# Patient Record
Sex: Female | Born: 1940
Health system: Southern US, Community
[De-identification: ages and names within clinical notes are randomized; demographics above are authoritative.]

## PROBLEM LIST (undated history)

## (undated) DIAGNOSIS — E119 Type 2 diabetes mellitus without complications: Secondary | ICD-10-CM

## (undated) DIAGNOSIS — I350 Nonrheumatic aortic (valve) stenosis: Secondary | ICD-10-CM

## (undated) DIAGNOSIS — C519 Malignant neoplasm of vulva, unspecified: Secondary | ICD-10-CM

## (undated) DIAGNOSIS — N3642 Intrinsic sphincter deficiency (ISD): Secondary | ICD-10-CM

## (undated) DIAGNOSIS — R011 Cardiac murmur, unspecified: Secondary | ICD-10-CM

## (undated) DIAGNOSIS — Z8679 Personal history of other diseases of the circulatory system: Secondary | ICD-10-CM

## (undated) DIAGNOSIS — Z8719 Personal history of other diseases of the digestive system: Secondary | ICD-10-CM

## (undated) DIAGNOSIS — I10 Essential (primary) hypertension: Secondary | ICD-10-CM

## (undated) DIAGNOSIS — Z8711 Personal history of peptic ulcer disease: Secondary | ICD-10-CM

## (undated) DIAGNOSIS — M1990 Unspecified osteoarthritis, unspecified site: Secondary | ICD-10-CM

## (undated) DIAGNOSIS — R918 Other nonspecific abnormal finding of lung field: Secondary | ICD-10-CM

## (undated) DIAGNOSIS — R32 Unspecified urinary incontinence: Secondary | ICD-10-CM

## (undated) DIAGNOSIS — E559 Vitamin D deficiency, unspecified: Secondary | ICD-10-CM

## (undated) DIAGNOSIS — Z9889 Other specified postprocedural states: Secondary | ICD-10-CM

## (undated) DIAGNOSIS — D509 Iron deficiency anemia, unspecified: Secondary | ICD-10-CM

## (undated) DIAGNOSIS — Z923 Personal history of irradiation: Secondary | ICD-10-CM

## (undated) DIAGNOSIS — M792 Neuralgia and neuritis, unspecified: Secondary | ICD-10-CM

## (undated) DIAGNOSIS — R112 Nausea with vomiting, unspecified: Secondary | ICD-10-CM

## (undated) HISTORY — PX: KNEE ARTHROSCOPY: SUR90

## (undated) HISTORY — DX: Personal history of irradiation: Z92.3

## (undated) HISTORY — PX: TOE SURGERY: SHX1073

## (undated) HISTORY — PX: CATARACT EXTRACTION W/ INTRAOCULAR LENS  IMPLANT, BILATERAL: SHX1307

## (undated) HISTORY — DX: Essential (primary) hypertension: I10

## (undated) HISTORY — PX: ANTERIOR CERVICAL DECOMP/DISCECTOMY FUSION: SHX1161

## (undated) HISTORY — DX: Vitamin D deficiency, unspecified: E55.9

## (undated) HISTORY — PX: LUMBAR SPINE SURGERY: SHX701

## (undated) HISTORY — DX: Nonrheumatic aortic (valve) stenosis: I35.0

## (undated) HISTORY — PX: TOTAL KNEE ARTHROPLASTY: SHX125

---

## 1965-01-21 HISTORY — PX: TONSILLECTOMY: SUR1361

## 1977-01-21 HISTORY — PX: ABDOMINAL HYSTERECTOMY: SHX81

## 2000-10-17 ENCOUNTER — Ambulatory Visit (HOSPITAL_COMMUNITY): Admission: RE | Admit: 2000-10-17 | Discharge: 2000-10-17 | Payer: Self-pay | Admitting: Family Medicine

## 2000-10-17 ENCOUNTER — Encounter: Payer: Self-pay | Admitting: Family Medicine

## 2002-03-11 ENCOUNTER — Ambulatory Visit (HOSPITAL_COMMUNITY): Admission: RE | Admit: 2002-03-11 | Discharge: 2002-03-11 | Payer: Self-pay | Admitting: Family Medicine

## 2002-03-11 ENCOUNTER — Encounter: Payer: Self-pay | Admitting: Family Medicine

## 2002-05-18 ENCOUNTER — Ambulatory Visit (HOSPITAL_BASED_OUTPATIENT_CLINIC_OR_DEPARTMENT_OTHER): Admission: RE | Admit: 2002-05-18 | Discharge: 2002-05-18 | Payer: Self-pay | Admitting: Orthopaedic Surgery

## 2002-09-14 ENCOUNTER — Ambulatory Visit (HOSPITAL_COMMUNITY): Admission: RE | Admit: 2002-09-14 | Discharge: 2002-09-14 | Payer: Self-pay | Admitting: Family Medicine

## 2002-09-14 ENCOUNTER — Encounter: Payer: Self-pay | Admitting: Family Medicine

## 2002-09-21 ENCOUNTER — Ambulatory Visit (HOSPITAL_COMMUNITY): Admission: RE | Admit: 2002-09-21 | Discharge: 2002-09-21 | Payer: Self-pay | Admitting: Family Medicine

## 2002-09-21 ENCOUNTER — Encounter: Payer: Self-pay | Admitting: Family Medicine

## 2002-11-09 ENCOUNTER — Ambulatory Visit (HOSPITAL_COMMUNITY): Admission: RE | Admit: 2002-11-09 | Discharge: 2002-11-10 | Payer: Self-pay | Admitting: Neurosurgery

## 2002-11-09 ENCOUNTER — Encounter: Payer: Self-pay | Admitting: Neurosurgery

## 2002-12-01 ENCOUNTER — Encounter: Admission: RE | Admit: 2002-12-01 | Discharge: 2002-12-01 | Payer: Self-pay | Admitting: Neurosurgery

## 2002-12-07 ENCOUNTER — Encounter (HOSPITAL_COMMUNITY): Admission: RE | Admit: 2002-12-07 | Discharge: 2003-01-06 | Payer: Self-pay | Admitting: Neurosurgery

## 2003-05-27 ENCOUNTER — Encounter (HOSPITAL_COMMUNITY): Admission: RE | Admit: 2003-05-27 | Discharge: 2003-06-29 | Payer: Self-pay | Admitting: Family Medicine

## 2004-05-24 ENCOUNTER — Ambulatory Visit (HOSPITAL_COMMUNITY): Admission: RE | Admit: 2004-05-24 | Discharge: 2004-05-24 | Payer: Self-pay | Admitting: Family Medicine

## 2004-07-01 ENCOUNTER — Emergency Department (HOSPITAL_COMMUNITY): Admission: EM | Admit: 2004-07-01 | Discharge: 2004-07-01 | Payer: Self-pay | Admitting: Emergency Medicine

## 2005-04-05 ENCOUNTER — Ambulatory Visit (HOSPITAL_COMMUNITY): Admission: RE | Admit: 2005-04-05 | Discharge: 2005-04-05 | Payer: Self-pay | Admitting: Family Medicine

## 2006-03-30 ENCOUNTER — Encounter: Admission: RE | Admit: 2006-03-30 | Discharge: 2006-03-30 | Payer: Self-pay | Admitting: Orthopedic Surgery

## 2006-09-03 ENCOUNTER — Ambulatory Visit (HOSPITAL_COMMUNITY): Admission: RE | Admit: 2006-09-03 | Discharge: 2006-09-03 | Payer: Self-pay | Admitting: Family Medicine

## 2007-04-22 ENCOUNTER — Ambulatory Visit (HOSPITAL_COMMUNITY): Admission: RE | Admit: 2007-04-22 | Discharge: 2007-04-22 | Payer: Self-pay | Admitting: Family Medicine

## 2007-08-13 ENCOUNTER — Ambulatory Visit (HOSPITAL_COMMUNITY): Admission: RE | Admit: 2007-08-13 | Discharge: 2007-08-13 | Payer: Self-pay | Admitting: Family Medicine

## 2007-09-11 ENCOUNTER — Encounter: Admission: RE | Admit: 2007-09-11 | Discharge: 2007-09-11 | Payer: Self-pay | Admitting: Orthopedic Surgery

## 2008-05-17 ENCOUNTER — Ambulatory Visit (HOSPITAL_COMMUNITY): Admission: RE | Admit: 2008-05-17 | Discharge: 2008-05-17 | Payer: Self-pay | Admitting: Family Medicine

## 2008-08-02 ENCOUNTER — Ambulatory Visit: Payer: Self-pay | Admitting: Internal Medicine

## 2008-08-02 DIAGNOSIS — R011 Cardiac murmur, unspecified: Secondary | ICD-10-CM

## 2008-08-02 DIAGNOSIS — R197 Diarrhea, unspecified: Secondary | ICD-10-CM

## 2008-08-03 ENCOUNTER — Encounter: Payer: Self-pay | Admitting: Internal Medicine

## 2008-08-04 ENCOUNTER — Ambulatory Visit: Payer: Self-pay | Admitting: Internal Medicine

## 2008-08-04 ENCOUNTER — Ambulatory Visit (HOSPITAL_COMMUNITY): Admission: RE | Admit: 2008-08-04 | Discharge: 2008-08-04 | Payer: Self-pay | Admitting: Internal Medicine

## 2008-08-04 ENCOUNTER — Encounter: Payer: Self-pay | Admitting: Internal Medicine

## 2008-08-14 ENCOUNTER — Encounter: Payer: Self-pay | Admitting: Internal Medicine

## 2008-08-15 ENCOUNTER — Encounter (INDEPENDENT_AMBULATORY_CARE_PROVIDER_SITE_OTHER): Payer: Self-pay

## 2008-08-17 ENCOUNTER — Telehealth (INDEPENDENT_AMBULATORY_CARE_PROVIDER_SITE_OTHER): Payer: Self-pay

## 2008-08-23 ENCOUNTER — Encounter: Payer: Self-pay | Admitting: Internal Medicine

## 2008-08-23 DIAGNOSIS — K5289 Other specified noninfective gastroenteritis and colitis: Secondary | ICD-10-CM | POA: Insufficient documentation

## 2008-08-24 ENCOUNTER — Encounter: Payer: Self-pay | Admitting: Internal Medicine

## 2008-08-25 LAB — CONVERTED CEMR LAB
Calcium: 9.9 mg/dL (ref 8.4–10.5)
Creatinine, Ser: 0.98 mg/dL (ref 0.40–1.20)
Sodium: 142 meq/L (ref 135–145)

## 2008-10-19 ENCOUNTER — Ambulatory Visit: Payer: Self-pay | Admitting: Cardiology

## 2008-10-19 ENCOUNTER — Ambulatory Visit (HOSPITAL_COMMUNITY): Admission: RE | Admit: 2008-10-19 | Discharge: 2008-10-19 | Payer: Self-pay | Admitting: Family Medicine

## 2008-10-19 ENCOUNTER — Encounter: Payer: Self-pay | Admitting: Family Medicine

## 2008-11-14 ENCOUNTER — Ambulatory Visit: Payer: Self-pay | Admitting: Gastroenterology

## 2008-12-05 ENCOUNTER — Encounter: Payer: Self-pay | Admitting: Gastroenterology

## 2008-12-12 LAB — CONVERTED CEMR LAB
Calcium: 9.4 mg/dL (ref 8.4–10.5)
Chloride: 103 meq/L (ref 96–112)
Creatinine, Ser: 0.67 mg/dL (ref 0.40–1.20)
Glucose, Bld: 119 mg/dL — ABNORMAL HIGH (ref 70–99)
Potassium: 4 meq/L (ref 3.5–5.3)
Sodium: 143 meq/L (ref 135–145)

## 2009-02-24 ENCOUNTER — Ambulatory Visit: Payer: Self-pay | Admitting: Internal Medicine

## 2009-02-27 DIAGNOSIS — Z860100 Personal history of colon polyps, unspecified: Secondary | ICD-10-CM | POA: Insufficient documentation

## 2009-05-04 ENCOUNTER — Encounter: Payer: Self-pay | Admitting: Internal Medicine

## 2009-10-16 ENCOUNTER — Ambulatory Visit (HOSPITAL_COMMUNITY): Admission: RE | Admit: 2009-10-16 | Discharge: 2009-10-16 | Payer: Self-pay | Admitting: Family Medicine

## 2010-02-20 NOTE — Assessment & Plan Note (Signed)
Summary: 70M FOLLOW UP/CDG   Visit Type:  Follow-up Visit Primary Care Provider:  Lubertha South  Chief Complaint:  F/U colitis.  History of Present Illness: History of tubular adenoma and microscopic colitis on colonoscopy last year. She is due for surveillance colonoscopy 2017. She could not afford Entocort. She took a course of lialda but tapered off this medication as she ran out over the Christmas holidays. She continues to be markedly improved having one to 2 bowel movements on average daily rarely has more than2; she never has more than 3. Her stools are formed. She is not have any blood per rectum. She has gained 6 pounds since her October 2010 weight in here. She is very pleased with her improvement.    Current Medications (verified): 1)  Metformin Hcl 500 Mg Tabs (Metformin Hcl) .... Take 1 Two Times A Day 2)  Avapro 150 Mg Tabs (Irbesartan) .... Once Daily 3)  Hydrochlorothiazide 25 Mg Tabs (Hydrochlorothiazide) .... Once Daily 4)  Aspirin 81 Mg Tbec (Aspirin) .... Once Daily  Allergies (verified): 1)  ! Relafen 2)  ! Tetracycline  Past History:  Past Medical History: Last updated: 2008/08/31 Diabetes, diagnosed 2 years ago Hypertension H/O Rheumatic fever  Family History: Last updated: August 31, 2008 Father, deceased secondary to renal failure, had throat cancer No FH of Colon Cancer Sister - breast cancer, separate bone cancer  Social History: Last updated: 2008-08-31 Marital Status: Married Children: Two Occupation: Retired from [employer] Patient has never smoked.  Alcohol Use - no Illicit Drug Use - no  Past Surgical History: Back Surgery Hysterectomy Tonsillectomy Knee Arthroscopy X2  Cervical disk surgery Left foot little toe surgery 08/2008  Vital Signs:  Patient profile:   70 year old female Height:      64 inches Weight:      222 pounds BMI:     38.24 Temp:     97.7 degrees F oral Pulse rate:   80 / minute BP sitting:   150 / 80  (left arm) Cuff  size:   large  Vitals Entered By: Cloria Spring LPN (February 24, 2009 9:30 AM)  Physical Exam  General:  very pleasant 70 year old lady in no distress Eyes:  no scleral icterus. Conjunctiva are pink Lungs:  clear to auscultation Heart:  regular rate and rhythm without murmur gallop rub Abdomen:  obese, positive bowel sounds, soft nontender without appreciable mass or organomegaly  Impression & Recommendations: Impression: Diarrhea secondary to microscopic colitis now in remission after a course of mesalamine therapy. History of tubular adenoma; due for surveillance colonoscopy 2017.  Recommendations: I told this nice lady she may well have a durable long-term remission after a course of mesalamine therapy. However, she could have a recurrence in the future. If that were to be the case, she would do need to go back on a course of lialda.  But for now, I feel that she can stay off of lialda.  Follow up here p.r.n.      Appended Document: Orders Update-charge    Clinical Lists Changes  Problems: Added new problem of COLONIC POLYPS, HX OF (ICD-V12.72) Orders: Added new Service order of Est. Patient Level III (84696) - Signed

## 2010-02-20 NOTE — Letter (Signed)
Summary: Internal Other  Internal Other   Imported By: Peggyann Shoals 05/04/2009 12:29:36  _____________________________________________________________________  External Attachment:    Type:   Image     Comment:   External Document

## 2010-03-20 ENCOUNTER — Encounter (HOSPITAL_COMMUNITY): Payer: Medicare Other

## 2010-03-20 ENCOUNTER — Ambulatory Visit (HOSPITAL_COMMUNITY)
Admission: RE | Admit: 2010-03-20 | Discharge: 2010-03-20 | Disposition: A | Discharge status: Home / Self Care | Payer: Medicare Other | Source: Ambulatory Visit | Attending: Orthopedic Surgery | Admitting: Orthopedic Surgery

## 2010-03-20 ENCOUNTER — Other Ambulatory Visit: Payer: Self-pay | Admitting: Orthopedic Surgery

## 2010-03-20 ENCOUNTER — Other Ambulatory Visit (HOSPITAL_COMMUNITY): Payer: Self-pay | Admitting: Orthopedic Surgery

## 2010-03-20 DIAGNOSIS — Z01810 Encounter for preprocedural cardiovascular examination: Secondary | ICD-10-CM | POA: Insufficient documentation

## 2010-03-20 DIAGNOSIS — M1710 Unilateral primary osteoarthritis, unspecified knee: Secondary | ICD-10-CM | POA: Insufficient documentation

## 2010-03-20 DIAGNOSIS — Z01818 Encounter for other preprocedural examination: Secondary | ICD-10-CM | POA: Insufficient documentation

## 2010-03-20 DIAGNOSIS — IMO0002 Reserved for concepts with insufficient information to code with codable children: Secondary | ICD-10-CM | POA: Insufficient documentation

## 2010-03-20 DIAGNOSIS — M1711 Unilateral primary osteoarthritis, right knee: Secondary | ICD-10-CM

## 2010-03-20 DIAGNOSIS — Z01812 Encounter for preprocedural laboratory examination: Secondary | ICD-10-CM | POA: Insufficient documentation

## 2010-03-20 LAB — COMPREHENSIVE METABOLIC PANEL
ALT: 27 U/L (ref 0–35)
AST: 24 U/L (ref 0–37)
Alkaline Phosphatase: 70 U/L (ref 39–117)
CO2: 31 mEq/L (ref 19–32)
GFR calc Af Amer: >60 mL/min (ref >60)
GFR calc non Af Amer: >60 mL/min (ref >60)
Glucose, Bld: 93 mg/dL (ref 70–99)
Sodium: 142 mEq/L (ref 135–145)

## 2010-03-20 LAB — SURGICAL PCR SCREEN
MRSA, PCR: NEGATIVE
Staphylococcus aureus: NEGATIVE

## 2010-03-20 LAB — URINALYSIS, ROUTINE W REFLEX MICROSCOPIC: Nitrite: NEGATIVE

## 2010-03-20 LAB — DIFFERENTIAL
Lymphs Abs: 1.9 10*3/uL (ref 0.7–4.0)
Monocytes Absolute: 0.7 10*3/uL (ref 0.1–1.0)
Monocytes Relative: 10 % (ref 3–12)
Neutro Abs: 4.5 10*3/uL (ref 1.7–7.7)
Neutrophils Relative %: 62 % (ref 43–77)

## 2010-03-20 LAB — CBC
HCT: 39.7 % (ref 36.0–46.0)
MCH: 27.8 pg (ref 26.0–34.0)
MCV: 86.9 fL (ref 78.0–100.0)
Platelets: 179 10*3/uL (ref 150–400)
RDW: 13.1 % (ref 11.5–15.5)

## 2010-03-20 LAB — PROTIME-INR: INR: 1 (ref 0.00–1.49)

## 2010-03-20 LAB — URINE MICROSCOPIC-ADD ON

## 2010-03-28 ENCOUNTER — Inpatient Hospital Stay (HOSPITAL_COMMUNITY)
Admission: RE | Admit: 2010-03-28 | Discharge: 2010-04-02 | DRG: 470 | Disposition: A | Discharge status: Home Health | Payer: Medicare Other | Source: Ambulatory Visit | Attending: Orthopedic Surgery | Admitting: Orthopedic Surgery

## 2010-03-28 ENCOUNTER — Inpatient Hospital Stay (HOSPITAL_COMMUNITY): Payer: Medicare Other

## 2010-03-28 DIAGNOSIS — M1710 Unilateral primary osteoarthritis, unspecified knee: Principal | ICD-10-CM | POA: Diagnosis present

## 2010-03-28 DIAGNOSIS — E119 Type 2 diabetes mellitus without complications: Secondary | ICD-10-CM | POA: Diagnosis present

## 2010-03-28 DIAGNOSIS — I10 Essential (primary) hypertension: Secondary | ICD-10-CM | POA: Diagnosis present

## 2010-03-28 DIAGNOSIS — M21869 Other specified acquired deformities of unspecified lower leg: Secondary | ICD-10-CM | POA: Diagnosis present

## 2010-03-28 LAB — ABO/RH: ABO/RH(D): B POS

## 2010-03-28 LAB — GLUCOSE, CAPILLARY: Glucose-Capillary: 144 mg/dL — ABNORMAL HIGH (ref 70–99)

## 2010-03-29 LAB — GLUCOSE, CAPILLARY: Glucose-Capillary: 123 mg/dL — ABNORMAL HIGH (ref 70–99)

## 2010-03-29 LAB — PROTIME-INR: Prothrombin Time: 14.6 seconds (ref 11.6–15.2)

## 2010-03-30 LAB — GLUCOSE, CAPILLARY
Glucose-Capillary: 120 mg/dL — ABNORMAL HIGH (ref 70–99)
Glucose-Capillary: 126 mg/dL — ABNORMAL HIGH (ref 70–99)
Glucose-Capillary: 106 mg/dL — ABNORMAL HIGH (ref 70–99)

## 2010-03-30 LAB — HEMOGLOBIN AND HEMATOCRIT, BLOOD: HCT: 32 % — ABNORMAL LOW (ref 36.0–46.0)

## 2010-03-31 LAB — GLUCOSE, CAPILLARY
Glucose-Capillary: 110 mg/dL — ABNORMAL HIGH (ref 70–99)
Glucose-Capillary: 93 mg/dL (ref 70–99)

## 2010-03-31 LAB — PROTIME-INR: Prothrombin Time: 17.4 seconds — ABNORMAL HIGH (ref 11.6–15.2)

## 2010-04-01 LAB — GLUCOSE, CAPILLARY

## 2010-04-02 LAB — GLUCOSE, CAPILLARY: Glucose-Capillary: 115 mg/dL — ABNORMAL HIGH (ref 70–99)

## 2010-04-02 LAB — PROTIME-INR
INR: 1.57 — ABNORMAL HIGH (ref 0.00–1.49)
Prothrombin Time: 19 seconds — ABNORMAL HIGH (ref 11.6–15.2)

## 2010-04-03 NOTE — H&P (Addendum)
[name], [name]               ACCOUNT NO.:  [id number]  MEDICAL RECORD NO.:  [id number]           PATIENT TYPE:  LOCATION:                                 FACILITY:  PHYSICIAN:  Georges Lynch. Hillis Mcphatter, M.D.DATE OF BIRTH:  Jun 17, 1940  DATE OF ADMISSION: DATE OF DISCHARGE:                             HISTORY & PHYSICAL   CHIEF COMPLAINT:  Right knee pain.  BRIEF HISTORY:  [name] has been followed by Dr. Darrelyn Hillock for worsening pain in her right knee.  She has failed conservative measures such as cortisone injection as well as viscous supplementation.  At this point, she feels as though the knee will give out.  It does wake her up at nighttime and it is hurting her at all times.  She now presents for right total knee arthroplasty.  Her primary care physician is Dr. Simone Curia and she has been cleared for surgery by him.  MEDICATION ALLERGIES:  REGLAN, THIS CAUSES NAUSEA, VOMITING; TETRACYCLINE CAUSES HEADACHE; AND OXYCODONE ALSO CAUSES NAUSEA, VOMITING.  BODY:  The patient states she does fine with Vicodin.  CURRENT MEDICATIONS:  Losartan, metformin, Lasix, Klor-Con, aspirin which she will discontinue a week prior to surgery.  PAST MEDICAL HISTORY: 1. End-stage arthritis of the right knee. 2. Vertigo. 3. Rheumatic fever as a child. 4. Hypertension. 5. Heart murmur. 6. Non-insulin dependent diabetes. 7. Arthritis. 8. Degenerative disk disease. 9. History of measles as a child. 10.History of mumps as a child.  PAST SURGICAL HISTORY: 1. Tonsillectomy. 2. Hysterectomy. 3. Left knee arthroscopy. 4. Back surgery x2.  FAMILY HISTORY:  Father passed at the age of 17, he had hypertension and cancer.  Mother passed at age of 77, also hypertension and aneurysm.  SOCIAL HISTORY:  The patient is married.  She denies past or present use of alcohol or tobacco products.  She lives at home with her husband. She does plan to go home following her hospital stay.  REVIEW OF  SYSTEMS:  GENERAL:  Negative for fever, chills or weight change.  HEENT:  Negative for insomnia or blurred vision. CARDIOVASCULAR:  Negative for chest pain.  RESPIRATORY:  Negative for shortness of breath.  GI: Negative for reflux disease or ulcers.  GU: Negative for hematuria.  MUSCULOSKELETAL:  Positive for joint pain and joint swelling.  DERMATOLOGIC:  Negative for rash or lesions.  PHYSICAL EXAMINATION:  VITAL SIGNS:  Blood pressure is 118/76 in the left arm. GENERAL:  [name] is alert and oriented x3.  She is well-developed, well-nourished and in no apparent distress. HEENT:  Normocephalic, atraumatic.  Extraocular movements intact. NECK:  Supple.  Full range of motion without lymphadenopathy. CHEST:  Lungs are clear to auscultation bilaterally without wheezes. HEART:  Regular rate and rhythm without murmur. ABDOMEN:  Bowel sounds present in all 4 quadrants. EXTREMITIES:  Evaluation of the right knee, negative for effusion, pain with palpation over the medial joint line.  Range is 0 to 120 degrees. Patella tracking normally. SKIN:  Unremarkable. NEUROLOGIC:  Intact.  RADIOGRAPHIC DATA:  AP and lateral views of the right knee reveal that she is bone-on-bone in the medial compartment  with some patellofemoral involvement.  IMPRESSION:  End-stage arthritis of the right knee.  PLAN:  Right total knee arthroplasty to be performed by Dr. Darrelyn Hillock.     Rozell Searing, PAC   ______________________________ Georges Lynch Darrelyn Hillock, M.D.    LD/MEDQ  D:  04/03/2010  T:  04/03/2010  Job:  [id number]  Electronically Signed by Rozell Searing  on 04/03/2010 07:02:30 PM Electronically Signed by Ranee Gosselin M.D. on 04/04/2010 08:29:50 AM

## 2010-04-04 NOTE — Op Note (Signed)
[name], [name]               ACCOUNT NO.:  [id number]  MEDICAL RECORD NO.:  [id number]           PATIENT TYPE:  I  LOCATION:  0004                         FACILITY:  [facility]  PHYSICIAN:  Georges Lynch. Aviela Blundell, M.D.DATE OF BIRTH:  [birth year]  DATE OF PROCEDURE:  03/28/2010 DATE OF DISCHARGE:                              OPERATIVE REPORT   SURGEON:  Georges Lynch. Darrelyn Hillock, M.D.  ASSISTANT:  Rozell Searing, [employer]  PREOPERATIVE DIAGNOSIS:  Severe degenerative arthritis with a varus deformity of the right knee.  POSTOPERATIVE DIAGNOSIS:  Severe degenerative arthritis with a varus deformity of the right knee.  OPERATION:  Right total-knee arthroplasty utilizing DePuy system.  All three components were cemented.  The sizes used was a size 4 tibial insert with a 10-mm thickness, a size 38 patella.  The tibial tray was a size 4 cemented.  The femoral component was a size 4 right posterior stabilized and gentamicin was used in the cement.PROCEDURE IN DETAIL:  Under general anesthesia, routine orthopedic prepping and draping of the right lower extremity was carried out.  She had 2 grams of IV Ancef.  The appropriate time-out was carried out first.  Prior to that, I marked the appropriate right leg in the holding area.  Once the leg was exsanguinated with an Esmarch, the tourniquet was elevated to 325 mmHg.  Then the knee was flexed on the [facility] knee holder.  I then made an incision down the anterior aspect of the right knee.  Bleeders were identified and cauterized.  Two flaps were created. I then went on and carried out a median parapatellar incision.  I reflected the patella laterally, flexed the knee, and did medial and lateral meniscectomy.  Note, the knee was extremely tight so I did a nice soft tissue release first.  Following that, I went on and made my initial drill hole in the intercondylar notch of the femur.  I then removed 12-mm thickness off the distal aspect of the femur.  Next,  we measured the femur to be a size 4 and I carried out my anterior, posterior and chamfer cuts of the femur in the usual fashion.  Following that, I then prepared the tibia for a size 4 tibia.  I removed approximately 10 mm thickness off the unaffected lateral side.  Note, we went through two separate cuts to make sure we had the appropriate gap. We then measured the gap with the appropriate flexion and extension gap instruments.  Following that, I then went on and cut my keel cut out of the tibial plateau.  I then went on and cut my notch cut out of the distal femur and the trials were inserted.  We had an excellent fit. With the trials in place, I then did a resurfacing procedure on the patella in the usual fashion.  Three drill holes were made in the patella.  Following that, I then removed all trial components, thoroughly water picked out the knee and cemented all three components in simultaneously.  Once the cement was hardened, I then removed all loose pieces of cement.  We water picked out the knee again  and made sure there were no loose pieces of cement.  I then inserted my 10-mm thickness size 4 rotating platform into the tibial tray.  I did that after I injected 10 cc of Surgiflo into the soft tissue areas.  I then reduced the knee and took the knee through flexion and extension and we had good stability and good motion.  I then closed the knee in the layered usual fashion over a Hemovac drain.  I inserted some thrombin-soaked Gelfoam.  The tourniquet was let down after I closed the first layer.  We had excellent hemostasis.  Subcutaneous was closed with 0-Vicryl, skin with metal staples.  A sterile Neosporin dressing was applied.          ______________________________ Georges Lynch Darrelyn Hillock, M.D.     RAG/MEDQ  D:  03/28/2010  T:  03/28/2010  Job:  [id number]  cc:   Windy Fast A. Darrelyn Hillock, M.D. Fax: [phone]  Electronically Signed by Ranee Gosselin M.D. on 04/04/2010 08:29:46  AM

## 2010-04-25 NOTE — Discharge Summary (Addendum)
[name], [name]               ACCOUNT NO.:  [id number]  MEDICAL RECORD NO.:  [id number]          PATIENT TYPE:  LOCATION:                                 FACILITY:  PHYSICIAN:  Georges Lynch. Brent Taillon, M.D.DATE OF BIRTH:  03-Jun-1940  DATE OF ADMISSION: DATE OF DISCHARGE:                              DISCHARGE SUMMARY   ADMITTING DIAGNOSES: 1. End-stage arthritis of the right knee. 2. Vertigo. 3. Rheumatic fever, as a child. 4. Hypertension. 5. Heart murmur. 6. Non-insulin dependent diabetes. 7. Arthritis. 8. Degenerative disk disease. 9. History of measles, as a child. 10.History of mumps, as a child.  DISCHARGE DIAGNOSES: 1. End-stage arthritis of the right knee, status post right total knee     arthroplasty. 2. Vertigo. 3. Rheumatic fever, as a child. 4. Hypertension. 5. Heart murmur. 6. Non-insulin dependent diabetes. 7. Arthritis. 8. Degenerative disk disease. 9. History of measles, as a child. 10.History of mumps, as a child.  LABORATORY DATA:  Serial H and Hs were taken throughout the patient's hospital stay.  Postoperative day #1, the patient's hemoglobin was 11.2, her INR had reached 1.12.  Postoperative day #2, hemoglobin had dropped to 10.3, INR had reached 1.68.  Postoperative day #3, the patient's hemoglobin dropped further to 9.9.  She did not require blood transfusion throughout her hospital stay.  Postoperative day #5, the patient's INR was at 1.57.  PROCEDURE:  On March 28, 2010, [name] was taken to the operating room by surgeon, Dr. Ranee Gosselin, and assistant, Rozell Searing, PA- C.  HOSPITAL COURSE:  She underwent right total knee arthroplasty.  The procedure was performed under general anesthesia.  Routine orthopedic prep and drape were carried out.  She received 2 g of IV Ancef preoperatively.  There were no complications with the procedure.  She was returned to the recovery room in satisfactory condition. Postoperative radiograph  revealed satisfactory postoperative appearance of a right total knee.  After adequate time in the recovery room, she was transported to the orthopedic floor.  For further recovery, she was placed on reduced-dose Dilaudid PCA as well as muscle relaxant. Postoperative day #1, the patient was doing well.  We discontinued her Foley as well as her PCA and she was to ambulate with physical therapy. She was also started on Coumadin and Lovenox for protection against DVT. Hemovac was also removed on postoperative day #1.  On postoperative day #1, the patient was able to ambulate 50 feet with physical therapy. Postoperative day #2, she was able to ambulate 100 feet in the morning session and then 120 feet in the afternoon session using a rolling walker with minimal assistance.  Dressing was changed on postoperative day #2, wound was well approximated, no signs of infection.  Postop day #3, she continued to progress with therapy.  She was able to go up and down the stairs with assistance and continued with ambulation. Postoperative day #4, she was able to walk further with physical therapy as well as do the stairs.  She continued to progress nicely and postoperative day #5, she continued to do well.  She was discharged home on postoperative day #5.  DISPOSITION:  To home on April 02, 2010.  MEDICATIONS ON DISCHARGE: 1. Percocet. 2. Robaxin. 3. Coumadin. 4. Potassium chloride. 5. Lasix. 6. Metformin. 7. Losartan.  ACTIVITY:  The patient will increase activity slowly.  She should use a walker or assistance.  She is able to shower.  She should be mindful to not submerge the wound in water.  WOUND CARE:  She will need daily dressing changes.  SPECIAL INSTRUCTIONS:  The patient is be on Coumadin for 4 weeks from the day of surgery.  Home health will monitor her INR which she needs to be kept between 2-3 to remain therapeutic.  Diet, 1800-calorie diabetic diet.  FOLLOWUP:  [name] will  follow up with Dr. Darrelyn Hillock in 2 weeks from the day of surgery.  She will contact the office at [phone] and schedule this appointment.  CONDITION ON DISCHARGE:  Improving.     Rozell Searing, PAC   ______________________________ Georges Lynch Darrelyn Hillock, M.D.    LD/MEDQ  D:  04/22/2010  T:  04/23/2010  Job:  [id number]  Electronically Signed by Rozell Searing  on 04/25/2010 11:35:13 PM Electronically Signed by Ranee Gosselin M.D. on 05/04/2010 07:53:50 AM

## 2010-04-29 LAB — FECAL LACTOFERRIN, QUANT: Fecal Lactoferrin: NEGATIVE

## 2010-04-29 LAB — GLUCOSE, CAPILLARY

## 2010-04-29 LAB — OVA AND PARASITE EXAMINATION: Ova and parasites: NONE SEEN

## 2010-04-29 LAB — STOOL CULTURE

## 2010-04-29 LAB — CLOSTRIDIUM DIFFICILE EIA

## 2010-06-05 NOTE — Op Note (Signed)
NAMEELLENORE, ROSCOE               ACCOUNT NO.:  [id number]   MEDICAL RECORD NO.:  [id number]          PATIENT TYPE:  AMB   LOCATION:  DAY                           FACILITY:  [facility]   PHYSICIAN:  R. Roetta Sessions, M.D. DATE OF BIRTH:  1940/05/22   DATE OF PROCEDURE:  08/04/2008  DATE OF DISCHARGE:                               OPERATIVE REPORT   PROCEDURE:  Ileocolonoscopy and segmental biopsies, snare polypectomy,  stool sampling.   INDICATIONS FOR PROCEDURE:  A 70 year old lady with chronically loose  stools.  She has never had her lower GI tract evaluated.  Ileocolonoscopy now being done.  Risks, benefits, alternatives and  limitations have been discussed, questions answered.  Please see  documentation in the medical record.   PROCEDURE NOTE:  O2 saturation, blood pressure, pulse and respirations  were monitored throughout the entirety of the procedure.  Conscious  sedation Versed 4 mg IV, Demerol 75 mg IV in divided doses.  Instrument  Pentax video chip system.   FINDINGS:  Digital rectal exam revealed no abnormalities.  Endoscopic  findings:  The prep was adequate.  Colon:  Colonic mucosa was surveyed  from the rectosigmoid junction through the left transverse, right colon  to the appendiceal orifice, ileocecal valve and cecum.  These structures  were well seen and photographed for the record.  Terminal ileum was  intubated to 5 cm.  From this level the scope was slowly withdrawn.  All  previously mentioned mucosal surfaces were again seen.  The patient had  a few shallow sigmoid diverticula.  There was a 5 mm polyp on a stalk  mid descending colon which was hot snare removed.  The remainder of the  colonic mucosa and terminal ileum mucosa appeared normal.  Several  biopsies of descending and sigmoid segments were taken to rule out  microscopic colitis.  Stool samples sent to the microbiology lab.  Scope  was pulled down in the rectum.  Thorough examination of the rectal  mucosa including retroflexed view of the anal verge demonstrated no  abnormalities.  The patient tolerated the procedure well and was  reactive after endoscopy.  Cecal withdrawal time 11 minutes.   IMPRESSION:  Normal rectum, shallow sigmoid diverticula, 5 mm  pedunculated polyp mid descending colon status post hot snare removal,  status post segmental biopsies and stool sampling.  Remainder of colonic  mucosa and terminal ileum mucosa appeared normal.   RECOMMENDATIONS:  Diverticulosis/polyp literature provided to Ms.  Yeh.  Follow up on path and stool studies.  Further recommendations  to follow.      Jonathon Bellows, M.D.  Electronically Signed     RMR/MEDQ  D:  08/04/2008  T:  08/04/2008  Job:  [id number]

## 2010-06-08 NOTE — Op Note (Signed)
[name], [name]                         ACCOUNT NO.:  [id number]   MEDICAL RECORD NO.:  [id number]                   PATIENT TYPE:  OIB   LOCATION:  2860                                 FACILITY:  [facility]   PHYSICIAN:  Reinaldo Meeker, M.D.              DATE OF BIRTH:  05-03-1940   DATE OF PROCEDURE:  11/09/2002  DATE OF DISCHARGE:                                 OPERATIVE REPORT   PREOPERATIVE DIAGNOSIS:  Spinal stenosis, C3-4, C4-5, C5-6.   POSTOPERATIVE DIAGNOSIS:  Spinal stenosis, C3-4, C4-5, C5-6.   OPERATION PERFORMED:  C3-4, C4-5, C5-6 anterior cervical diskectomy with  bone bank fusion followed by Premier anterior cervical plating with the  operating microscope.   SURGEON:  Reinaldo Meeker, M.D.   ASSISTANT:  Kathaleen Maser. Pool, M.D.   ANESTHESIA:  General.   DESCRIPTION OF PROCEDURE:  After being placed in supine position and 10  pounds of halter traction, the patient's neck was prepped and draped in the  usual sterile fashion.  Localizing x-ray was taken prior to incision to  identify the appropriate level.  A transverse incision was made in the right  anterior neck starting at the midline and heading towards the medial aspect  of sternocleidomastoid muscle.  The platysma was then incised transversely  and the natural fascial plate between the strap muscles medially and the  sternocleidomastoid laterally was identified and followed down to the  anterior aspect of the cervical spine.  The longus colli muscles were  identified and split in the midline and stripped away bilaterally with the  Barista.  Self-retaining retractor was placed for exposure.  X-ray  showed approach to the appropriate levels.  Using a 15 blade, the annulus of  the disk at C3-4, C4-5 and C5-6 was incised.  Using pituitary rongeurs and  curets, the disk material was removed.  A high speed drill was used to widen  the interspace down to the posterior longitudinal ligament.  This was  done  at all three levels.  At this time the microscope was draped and brought  into the field and used for the remainder of the case.  Starting at C4-5, a  high speed drill was used to further widen the interspace and drill down to  the osteophytes that were pressing on the spinal cord.  Using 1 and 2 mm  Kerrison rongeurs these were removed to decompress the spinal cord  thoroughly.  Proximal foraminal decompression was then carried out  bilaterally, particularly on the right, more symptomatic side.  At this time  inspection was carried out for any evidence of residual compression and none  could be identified.  Attention was then turned to C4-5 where a similar  procedure was carried out.  Once again, the high speed drill was used to  drill all the way down to the osteophytes and these were removed with 1 to 2  mm  Kerrison punches.  Once again the posterior longitudinal ligament was  removed until the spinal dura was well exposed and decompressed.  Once  again, proximal foraminal decompression was carried out bilaterally.  Finally at C3-4 a similar procedure was carried out once again removing all  overgrown bony material as well as herniated disk material.  Once again,  spinal decompression was carried out thoroughly as well as proximal  foraminal decompression.  At this point inspection was carried out in all  directions for any evidence of residual compression.  None could be  identified.  Large amounts of irrigation were carried out.  Measurements  were taken and two 7 mm and one 8 mm bone bank plugs reconstituted.  After  irrigating once more and confirming hemostasis, the plugs were impacted with  8 mm plug going into C3-4 and the 7 mm plugs going into C4-5 and C5-6.  Fluoroscopy showed them to be in good position.  The microscope was removed  and fluoroscopy brought back into the field.  An appropriate length Premier  anterior  cervical plate was then chosen.  Under fluoroscopic  inferior drill  holes were placed followed by placement of the other six screws at variable  angles.  The locking mechanisms were then sewed into place and secured.  Final fluoroscopy showed the plate, plug and 13 mm screws to all be in good  position.  At the same time, large amounts of irrigation were carried out.  Any bleeding was controlled with bipolar coagulation.  The wound was closed  using interrupted Vicryl in the platysma muscle, inverted 5-0 PDS in the  subcuticular layer, Steri-Strips on the skin.  A sterile dressing was then  applied along with a soft collar and the patient was extubated and taken to  the recovery room in stable condition.                                                Reinaldo Meeker, M.D.    ROK/MEDQ  D:  11/09/2002  T:  11/09/2002  Job:  [id number]

## 2010-06-08 NOTE — Op Note (Signed)
[name], [name]                         ACCOUNT NO.:  [id number]   MEDICAL RECORD NO.:  [id number]                   PATIENT TYPE:  AMB   LOCATION:  DSC                                  FACILITY:  [facility]   PHYSICIAN:  Lubertha Basque. Jerl Santos, M.D.             DATE OF BIRTH:  1940/06/25   DATE OF PROCEDURE:  05/18/2002  DATE OF DISCHARGE:                                 OPERATIVE REPORT   PREOPERATIVE DIAGNOSES:  1. Left knee torn medial meniscus.  2. Left knee degenerative joint disease.   POSTOPERATIVE DIAGNOSES:  1. Left knee torn medial meniscus.  2. Left knee degenerative joint disease.   PROCEDURES:  1. Left knee partial medial meniscectomy.  2. Left knee chondroplasty of the patellofemoral and medial compartment.   ANESTHESIA:  General.   SURGEON:  Lubertha Basque. Jerl Santos, M.D.   ASSISTANT:  Lindwood Qua, P.A.   INDICATION FOR PROCEDURE:  The patient is a 70 year old woman who injured  her knee several months ago when she slipped in a parking lot.  She has  persisted with pain despite oral anti-inflammatories and rest.  She had an  arthroscopy five or six years ago on this knee and it had done well until  this recent episode.  She was offered an arthroscopy at this point, as she  has pain at rest and pain with activity.  Informed operative consent was  obtained after a discussion of the possible complications of, reaction to  anesthesia, and infection.   DESCRIPTION OF PROCEDURE:  The patient was taken to the operating suite,  where a general anesthetic was applied without difficulty.  She was  positioned supine and prepped and draped in a normal sterile fashion.  After  the administration of preop IV antibiotics, an arthroscopy of the left knee  was performed through a total of two inferior portals.  The suprapatellar  pouch was benign, while the patellofemoral joint exhibited some grade 4  change with some large loose flaps of articular cartilage which appeared  to  be getting caught in the joint.  This mostly involved the intertrochlear  groove.  A thorough chondroplasty was done.  The kneecap did track well.  The medial compartment was notable for some broad areas of grade 3 change on  the medial femoral condyle, addressed with a chondroplasty.  She did have a  tear of the posterior horn of the medial meniscus, degenerative in nature,  and this was addressed with a 5 or 10% partial medial meniscectomy back to  stable tissues.  The ACL appeared to be intact.  The lateral compartment was  benign with no evidence of meniscal or articular cartilage injury.  The knee  was thoroughly irrigated at the end of the case, followed by placement of  Marcaine with epinephrine and morphine plus some Depo-Medrol.  Adaptic was  placed over the wounds, followed by dry gauze and a loose Ace wrap.  Estimated blood loss and intraoperative fluids can be obtained from  anesthesia records.    DISPOSITION:  The patient was extubated in the operating room and taken to  the recovery room in stable condition.  Plans were for her to go home the  same day and follow up in the office in less than a week.  I will contact  her by phone tonight.                                                Lubertha Basque Jerl Santos, M.D.    PGD/MEDQ  D:  05/18/2002  T:  05/18/2002  Job:  [id number]

## 2011-11-08 ENCOUNTER — Ambulatory Visit (HOSPITAL_COMMUNITY)
Admission: RE | Admit: 2011-11-08 | Discharge: 2011-11-08 | Disposition: A | Discharge status: Home / Self Care | Payer: Medicare Other | Source: Ambulatory Visit | Attending: Family Medicine | Admitting: Family Medicine

## 2011-11-08 DIAGNOSIS — I359 Nonrheumatic aortic valve disorder, unspecified: Secondary | ICD-10-CM

## 2011-11-08 DIAGNOSIS — R011 Cardiac murmur, unspecified: Secondary | ICD-10-CM | POA: Insufficient documentation

## 2011-11-08 NOTE — Progress Notes (Signed)
*  PRELIMINARY RESULTS* Echocardiogram 2D Echocardiogram has been performed.  Conrad  11/08/2011, 10:54 AM

## 2012-05-18 ENCOUNTER — Encounter: Payer: Self-pay | Admitting: *Deleted

## 2012-05-21 ENCOUNTER — Encounter: Payer: Self-pay | Admitting: Nurse Practitioner

## 2012-05-21 ENCOUNTER — Ambulatory Visit (INDEPENDENT_AMBULATORY_CARE_PROVIDER_SITE_OTHER): Payer: Medicare Other | Admitting: Nurse Practitioner

## 2012-05-21 VITALS — BP 148/88 | Temp 98.4°F | Wt 214.0 lb

## 2012-05-21 DIAGNOSIS — L989 Disorder of the skin and subcutaneous tissue, unspecified: Secondary | ICD-10-CM

## 2012-05-21 NOTE — Progress Notes (Signed)
Subjective:  Presents with complaints of a persistent lesion on the right upper posterior leg for the past month. Minimally tender at times, nonpruritic. Has noticed minimal change in the lesion. Has a history of precancerous lesions, gets regular checkups with Dr. Margo Aye for skin cancer screening.  Objective:   BP 148/88  Temp(Src) 98.4 F (36.9 C) (Oral)  Wt 214 lb (97.07 kg)  BMI 36.72 kg/m2 NAD. Alert, oriented. Mainly raised faint pink lesion noted on the right thigh. Nontender to palpation. A faint superficial area of excoriation is noted. Appears to be superficial. No evidence of bacterial infection.  Assessment:Skin lesion of right leg  Plan: Recommended patient make an appointment with her dermatologist sometime within the next few weeks for evaluation of lesion. May try some steroid cream she has at home in the meantime.

## 2012-05-21 NOTE — Patient Instructions (Signed)
Cetaphil cream.

## 2012-07-06 ENCOUNTER — Encounter: Payer: Self-pay | Admitting: Family Medicine

## 2012-07-06 ENCOUNTER — Ambulatory Visit (INDEPENDENT_AMBULATORY_CARE_PROVIDER_SITE_OTHER): Payer: Medicare Other | Admitting: Family Medicine

## 2012-07-06 VITALS — BP 110/78 | Temp 98.8°F | Wt 207.0 lb

## 2012-07-06 DIAGNOSIS — E785 Hyperlipidemia, unspecified: Secondary | ICD-10-CM | POA: Insufficient documentation

## 2012-07-06 DIAGNOSIS — R7303 Prediabetes: Secondary | ICD-10-CM | POA: Insufficient documentation

## 2012-07-06 DIAGNOSIS — I10 Essential (primary) hypertension: Secondary | ICD-10-CM

## 2012-07-06 DIAGNOSIS — R7309 Other abnormal glucose: Secondary | ICD-10-CM

## 2012-07-06 DIAGNOSIS — J019 Acute sinusitis, unspecified: Secondary | ICD-10-CM

## 2012-07-06 MED ORDER — HYDROCODONE-ACETAMINOPHEN 5-325 MG PO TABS: 1.0000 | ORAL_TABLET | ORAL | Status: DC | PRN

## 2012-07-06 MED ORDER — AMOXICILLIN 500 MG PO TABS: ORAL_TABLET | ORAL | Status: AC

## 2012-07-06 NOTE — Patient Instructions (Signed)
Probiotic once daily for 2 to 3 weeks [facility] )_

## 2012-07-06 NOTE — Progress Notes (Signed)
  Subjective:    Patient ID: [name], female    DOB: Nov 11, 1940, 72 y.o.   MRN: [id number]  Cough This is a new problem. The current episode started in the past 7 days. Associated symptoms include nasal congestion and a sore throat.   Started last Weds. Cough increased over the weekend. Phlegm present. Feels tight in chest with coiugh. No wheeze or fever   Review of Systems  HENT: Positive for sore throat.   Respiratory: Positive for cough.        Objective:   Physical Exam  Nursing note and vitals reviewed. Constitutional: She appears well-developed.  HENT:  Head: Normocephalic.  Nose: Nose normal.  Mouth/Throat: Oropharynx is clear and moist. No oropharyngeal exudate.  Neck: Neck supple.  Cardiovascular: Normal rate and normal heart sounds.   No murmur heard. Pulmonary/Chest: Effort normal and breath sounds normal. She has no wheezes.  Lymphadenopathy:    She has no cervical adenopathy.  Skin: Skin is warm and dry.          Assessment & Plan:  Acute sinusitis-antibiotics prescribed. Followup if progressive symptoms. The papers for lab work given she needs followup for a well wellness exam or a comprehensive exam in the near future.

## 2012-07-07 LAB — HEPATIC FUNCTION PANEL
AST: 14 U/L (ref 0–37)
Albumin: 4 g/dL (ref 3.5–5.2)
Alkaline Phosphatase: 76 U/L (ref 39–117)
Bilirubin, Direct: 0.1 mg/dL (ref 0.0–0.3)
Indirect Bilirubin: 0.5 mg/dL (ref 0.0–0.9)
Total Bilirubin: 0.6 mg/dL (ref 0.3–1.2)

## 2012-07-07 LAB — BASIC METABOLIC PANEL
BUN: 15 mg/dL (ref 6–23)
CO2: 30 mEq/L (ref 19–32)
Calcium: 9.7 mg/dL (ref 8.4–10.5)
Chloride: 105 mEq/L (ref 96–112)
Creat: 0.72 mg/dL (ref 0.50–1.10)
Glucose, Bld: 110 mg/dL — ABNORMAL HIGH (ref 70–99)

## 2012-07-07 LAB — LIPID PANEL: LDL Cholesterol: 86 mg/dL (ref 0–99)

## 2012-07-08 ENCOUNTER — Encounter: Payer: Self-pay | Admitting: Family Medicine

## 2012-07-09 ENCOUNTER — Telehealth: Payer: Self-pay | Admitting: Family Medicine

## 2012-07-09 NOTE — Telephone Encounter (Signed)
Sent patient a copy of the letter / encounter dos 07/08/12.. Mailed out 07/10/12 - kal (closed encounter)

## 2012-07-23 ENCOUNTER — Other Ambulatory Visit (HOSPITAL_COMMUNITY): Payer: Self-pay | Admitting: Family Medicine

## 2012-07-28 ENCOUNTER — Other Ambulatory Visit (HOSPITAL_COMMUNITY): Payer: Self-pay | Admitting: Family Medicine

## 2012-07-29 ENCOUNTER — Ambulatory Visit: Payer: Medicare Other | Admitting: Nurse Practitioner

## 2012-07-30 ENCOUNTER — Encounter: Payer: Self-pay | Admitting: Nurse Practitioner

## 2012-07-30 ENCOUNTER — Ambulatory Visit (INDEPENDENT_AMBULATORY_CARE_PROVIDER_SITE_OTHER): Payer: Medicare Other | Admitting: Nurse Practitioner

## 2012-07-30 VITALS — BP 114/74 | HR 70 | Wt 209.0 lb

## 2012-07-30 DIAGNOSIS — I350 Nonrheumatic aortic (valve) stenosis: Secondary | ICD-10-CM | POA: Insufficient documentation

## 2012-07-30 DIAGNOSIS — R7301 Impaired fasting glucose: Secondary | ICD-10-CM

## 2012-07-30 DIAGNOSIS — E559 Vitamin D deficiency, unspecified: Secondary | ICD-10-CM | POA: Insufficient documentation

## 2012-07-30 DIAGNOSIS — I10 Essential (primary) hypertension: Secondary | ICD-10-CM | POA: Insufficient documentation

## 2012-07-30 NOTE — Progress Notes (Signed)
Subjective:  Presents to discuss recent labs.  Doing better with diet. Lower in fat.  Trying to be active.  Has to take frequent rests due to orthopaedic pain. No chest pain, unusual SOB.    Objective:   BP 114/74  Pulse 70  Wt 209 lb (94.802 kg)  BMI 35.86 kg/m2 NAD. Alert, Oriented. Lungs clear.  Heart RRR.  No change in murmur. Hbg A1C 6.1.  See labs 07/07/2012.   Assessment:Impaired fasting glucose - Plan: POCT glycosylated hemoglobin (Hb A1C)  Hypertension  Plan: Continue current medications.  Labs improved.  Recheck in 4 months.  Recommend wellness check-up.

## 2012-10-01 ENCOUNTER — Ambulatory Visit (INDEPENDENT_AMBULATORY_CARE_PROVIDER_SITE_OTHER): Payer: Medicare Other | Admitting: Family Medicine

## 2012-10-01 ENCOUNTER — Encounter: Payer: Self-pay | Admitting: Family Medicine

## 2012-10-01 VITALS — BP 118/80 | Ht 64.0 in | Wt 201.8 lb

## 2012-10-01 DIAGNOSIS — D179 Benign lipomatous neoplasm, unspecified: Secondary | ICD-10-CM

## 2012-10-01 NOTE — Progress Notes (Signed)
  Subjective:    Patient ID: GENESIS NOVOSAD, female    DOB: [birth year], 72 y.o.   MRN: [id number]  HPI Blister on left thumb. She noticed it several weeks ago when it was small. It has became larger, but is not painful. Recalls no injury. Noticed it in church looks like a bliser.  gowing at times, somewhat uncomfortable if she presses on it.  Also noted swelling and distal forearm. Right-sided. Volar surface. Soft in nature. Has been there for months somewhat concerned.    Review of Systems No chest pain no back pain no shortness of breath no change in weight ROS otherwise negative    Objective:   Physical Exam Alert no acute distress. Vitals stable. Lungs clear. Heart regular in rhythm. Right distal forearm soft no discrete edge lipoma-like swelling. Distal left thumb dorsum palpable subcutaneous nodule versus cyst.       Assessment & Plan:   impression #1 benign cyst distal thumb discussed #2 lipoma right arm discussed plan hold off on surgical referral etc. warning signs discussed. Call if worsens. WSL

## 2012-10-02 ENCOUNTER — Encounter: Payer: Self-pay | Admitting: Family Medicine

## 2012-10-21 ENCOUNTER — Other Ambulatory Visit: Payer: Self-pay | Admitting: Family Medicine

## 2012-12-21 ENCOUNTER — Ambulatory Visit: Payer: Medicare Other | Admitting: Nurse Practitioner

## 2012-12-24 ENCOUNTER — Ambulatory Visit (INDEPENDENT_AMBULATORY_CARE_PROVIDER_SITE_OTHER): Payer: Medicare Other | Admitting: Nurse Practitioner

## 2012-12-24 ENCOUNTER — Encounter: Payer: Self-pay | Admitting: Nurse Practitioner

## 2012-12-24 VITALS — BP 128/84 | Ht 64.0 in | Wt 203.2 lb

## 2012-12-24 DIAGNOSIS — E559 Vitamin D deficiency, unspecified: Secondary | ICD-10-CM

## 2012-12-24 DIAGNOSIS — R52 Pain, unspecified: Secondary | ICD-10-CM

## 2012-12-24 DIAGNOSIS — M1990 Unspecified osteoarthritis, unspecified site: Secondary | ICD-10-CM

## 2012-12-24 DIAGNOSIS — Z13820 Encounter for screening for osteoporosis: Secondary | ICD-10-CM

## 2012-12-24 DIAGNOSIS — Z5189 Encounter for other specified aftercare: Secondary | ICD-10-CM

## 2012-12-24 MED ORDER — HYDROCODONE-ACETAMINOPHEN 5-325 MG PO TABS: 1.0000 | ORAL_TABLET | ORAL | Status: DC | PRN

## 2012-12-24 NOTE — Patient Instructions (Signed)
Vitamin D 2000 units daily.

## 2012-12-28 ENCOUNTER — Encounter: Payer: Self-pay | Admitting: Nurse Practitioner

## 2012-12-28 DIAGNOSIS — M1990 Unspecified osteoarthritis, unspecified site: Secondary | ICD-10-CM | POA: Insufficient documentation

## 2012-12-28 NOTE — Progress Notes (Signed)
Subjective:  Presents for routine followup. Has arthritis in both shoulders. Her right shoulder is causing her most pain right now. Taking pain medication mainly at nighttime so she can rest. Rarely takes it during the day only if she has over used her shoulder joint. FBS this morning 116. Doing well with her diet. Limited activity due to orthopedic issues. No chest pain shortness of breath. Edema is stable, no orthopnea. Has not been taking her vitamin D.  Objective:   BP 128/84  Ht 5\' 4"  (1.626 m)  Wt 203 lb 4 oz (92.194 kg)  BMI 34.87 kg/m2 NAD. Alert, oriented. Lungs clear. Heart regular rate rhythm. Lower extremities trace pitting edema.  Assessment:Degenerative arthritis  Screening for osteoporosis - Plan: DG Bone Density  Pain management  Plan: Meds ordered this encounter  Medications  . HYDROcodone-acetaminophen (NORCO/VICODIN) 5-325 MG per tablet    Sig: Take 1 tablet by mouth every 4 (four) hours as needed.    Dispense:  30 tablet    Refill:  0   Recheck in 4 months, call back sooner if any problems. Daily vitamin D and calcium supplementation.

## 2012-12-28 NOTE — Assessment & Plan Note (Signed)
Continue pain medication at nighttime. Daily vitamin D and calcium supplementation. Bone density study scheduled.

## 2012-12-28 NOTE — Assessment & Plan Note (Signed)
Recommend daily vitamin D 2000 units.

## 2012-12-29 ENCOUNTER — Ambulatory Visit (HOSPITAL_COMMUNITY)
Admission: RE | Admit: 2012-12-29 | Discharge: 2012-12-29 | Disposition: A | Discharge status: Home / Self Care | Payer: Medicare Other | Source: Ambulatory Visit | Attending: Nurse Practitioner | Admitting: Nurse Practitioner

## 2012-12-29 DIAGNOSIS — Z13820 Encounter for screening for osteoporosis: Secondary | ICD-10-CM

## 2012-12-29 DIAGNOSIS — Z780 Asymptomatic menopausal state: Secondary | ICD-10-CM | POA: Insufficient documentation

## 2012-12-29 DIAGNOSIS — M899 Disorder of bone, unspecified: Secondary | ICD-10-CM | POA: Insufficient documentation

## 2012-12-29 DIAGNOSIS — R29890 Loss of height: Secondary | ICD-10-CM | POA: Insufficient documentation

## 2012-12-30 NOTE — Progress Notes (Signed)
Patient notified and verbalized understanding. 

## 2012-12-31 ENCOUNTER — Telehealth: Payer: Self-pay | Admitting: Family Medicine

## 2012-12-31 NOTE — Telephone Encounter (Signed)
Patient got a summons for jury duty yesterday for 02/01/13 and she would like a letter wrote excusing her for this due to not being able to sit that long because of her arthritis and taking fluid pills.

## 2013-01-04 NOTE — Telephone Encounter (Signed)
Left message on voicemail notifying patient that letter is ready for pickup.  

## 2013-01-04 NOTE — Telephone Encounter (Signed)
done

## 2013-01-20 ENCOUNTER — Other Ambulatory Visit: Payer: Self-pay | Admitting: Family Medicine

## 2013-02-15 ENCOUNTER — Other Ambulatory Visit: Payer: Self-pay | Admitting: Family Medicine

## 2013-03-17 ENCOUNTER — Other Ambulatory Visit: Payer: Self-pay | Admitting: Nurse Practitioner

## 2013-03-17 MED ORDER — HYDROCODONE-ACETAMINOPHEN 5-325 MG PO TABS: 1.0000 | ORAL_TABLET | ORAL | Status: DC | PRN

## 2013-04-09 ENCOUNTER — Ambulatory Visit (INDEPENDENT_AMBULATORY_CARE_PROVIDER_SITE_OTHER): Payer: Medicare Other | Admitting: Nurse Practitioner

## 2013-04-09 ENCOUNTER — Encounter: Payer: Self-pay | Admitting: Nurse Practitioner

## 2013-04-09 VITALS — BP 130/90 | Ht 64.0 in | Wt 205.0 lb

## 2013-04-09 DIAGNOSIS — I8310 Varicose veins of unspecified lower extremity with inflammation: Secondary | ICD-10-CM

## 2013-04-09 DIAGNOSIS — R5381 Other malaise: Secondary | ICD-10-CM

## 2013-04-09 DIAGNOSIS — R5383 Other fatigue: Secondary | ICD-10-CM

## 2013-04-09 DIAGNOSIS — I872 Venous insufficiency (chronic) (peripheral): Secondary | ICD-10-CM

## 2013-04-09 DIAGNOSIS — M2550 Pain in unspecified joint: Secondary | ICD-10-CM

## 2013-04-09 LAB — CBC WITH DIFFERENTIAL/PLATELET
BASOS PCT: 1 % (ref 0–1)
Basophils Absolute: 0.1 10*3/uL (ref 0.0–0.1)
Eosinophils Absolute: 0.2 10*3/uL (ref 0.0–0.7)
Eosinophils Relative: 2 % (ref 0–5)
HCT: 39.6 % (ref 36.0–46.0)
HEMOGLOBIN: 13.5 g/dL (ref 12.0–15.0)
Lymphocytes Relative: 27 % (ref 12–46)
Lymphs Abs: 2.1 10*3/uL (ref 0.7–4.0)
MCH: 28.1 pg (ref 26.0–34.0)
MCHC: 34.1 g/dL (ref 30.0–36.0)
MCV: 82.5 fL (ref 78.0–100.0)
MONOS PCT: 10 % (ref 3–12)
Monocytes Absolute: 0.8 10*3/uL (ref 0.1–1.0)
NEUTROS ABS: 4.7 10*3/uL (ref 1.7–7.7)
NEUTROS PCT: 60 % (ref 43–77)
PLATELETS: 215 10*3/uL (ref 150–400)
RBC: 4.8 MIL/uL (ref 3.87–5.11)
RDW: 13.9 % (ref 11.5–15.5)
WBC: 7.8 10*3/uL (ref 4.0–10.5)

## 2013-04-09 MED ORDER — TRIAMCINOLONE ACETONIDE 0.1 % EX CREA: 1.0000 "application " | TOPICAL_CREAM | Freq: Two times a day (BID) | CUTANEOUS | Status: DC

## 2013-04-09 MED ORDER — HYDROCODONE-ACETAMINOPHEN 5-325 MG PO TABS: 1.0000 | ORAL_TABLET | ORAL | Status: DC | PRN

## 2013-04-09 MED ORDER — MELOXICAM 15 MG PO TABS: 15.0000 mg | ORAL_TABLET | Freq: Every day | ORAL | Status: DC

## 2013-04-10 LAB — BASIC METABOLIC PANEL
BUN: 13 mg/dL (ref 6–23)
CALCIUM: 9.7 mg/dL (ref 8.4–10.5)
CHLORIDE: 102 meq/L (ref 96–112)
CO2: 30 meq/L (ref 19–32)
Creat: 0.8 mg/dL (ref 0.50–1.10)
GLUCOSE: 106 mg/dL — AB (ref 70–99)
POTASSIUM: 4.8 meq/L (ref 3.5–5.3)
SODIUM: 142 meq/L (ref 135–145)

## 2013-04-10 LAB — HEPATIC FUNCTION PANEL
ALT: 17 U/L (ref 0–35)
AST: 18 U/L (ref 0–37)
Albumin: 4.3 g/dL (ref 3.5–5.2)
Alkaline Phosphatase: 68 U/L (ref 39–117)
BILIRUBIN DIRECT: 0.1 mg/dL (ref 0.0–0.3)
BILIRUBIN TOTAL: 0.5 mg/dL (ref 0.2–1.2)
Indirect Bilirubin: 0.4 mg/dL (ref 0.2–1.2)
Total Protein: 7.1 g/dL (ref 6.0–8.3)

## 2013-04-10 LAB — SEDIMENTATION RATE: Sed Rate: 12 mm/hr (ref 0–22)

## 2013-04-10 LAB — RHEUMATOID FACTOR

## 2013-04-10 LAB — TSH: TSH: 3.266 u[IU]/mL (ref 0.350–4.500)

## 2013-04-12 LAB — LYME ABY, WSTRN BLT IGG & IGM W/BANDS
B burgdorferi IgG Abs (IB): NEGATIVE
B burgdorferi IgM Abs (IB): NEGATIVE
LYME DISEASE 28 KD IGG: NONREACTIVE
LYME DISEASE 39 KD IGM: NONREACTIVE
LYME DISEASE 41 KD IGG: REACTIVE — AB
LYME DISEASE 41 KD IGM: NONREACTIVE
LYME DISEASE 45 KD IGG: NONREACTIVE
LYME DISEASE 66 KD IGG: NONREACTIVE
LYME DISEASE 93 KD IGG: NONREACTIVE
Lyme Disease 18 kD IgG: NONREACTIVE
Lyme Disease 23 kD IgG: NONREACTIVE
Lyme Disease 23 kD IgM: NONREACTIVE
Lyme Disease 30 kD IgG: NONREACTIVE
Lyme Disease 39 kD IgG: NONREACTIVE
Lyme Disease 58 kD IgG: NONREACTIVE

## 2013-04-12 LAB — ANA: ANA: POSITIVE — AB

## 2013-04-12 LAB — CYCLIC CITRUL PEPTIDE ANTIBODY, IGG

## 2013-04-12 LAB — ANTI-NUCLEAR AB-TITER (ANA TITER): ANA Titer 1: 1:320 {titer} — ABNORMAL HIGH

## 2013-04-13 ENCOUNTER — Encounter: Payer: Self-pay | Admitting: Nurse Practitioner

## 2013-04-13 NOTE — Progress Notes (Signed)
Subjective:  Presents with complaints of increased fatigue over time. No chest or is ischemic pain, unusual shortness of breath or edema. Migratory joint pain which is worse at night. History of degenerative/osteoarthritis. Limited mobility due to joint pain. No improvement with naproxen. Takes at least one hydrocodone per day, sometimes takes a second dose depending on her activities for the day. This has helped keep her functioning and mobile. Patient also more tired than usual, taking more naps during the day. Blood sugars are doing well, FBS this morning 95. Also slightly itchy minimally tender rash on the pretibial area at times.  Objective:   BP 130/90  Ht 5\' 4"  (1.626 m)  Wt 205 lb (92.987 kg)  BMI 35.17 kg/m2 NAD. Alert, oriented. Cheerful affect. Lungs clear. Heart regular rate rhythm. Significant osteoarthritis changes noted in both hands and fingers. Thyroid normal limit to palpation and nontender. Faint erythema with excoriation noted on the left pretibial area. Mild varicosities noted. Mild venous stasis color changes noted. Area nontender to palpation. No masses noted.  Assessment: Fatigue - Plan: CBC with Differential, Hepatic function panel, Basic metabolic panel, TSH, Sedimentation rate, Cyclic citrul peptide antibody, IgG, Rheumatoid factor, ANA, Lyme Aby, Western Blot IgG & IgM w/bands  Arthralgia - Plan: CBC with Differential, Hepatic function panel, Basic metabolic panel, TSH, Sedimentation rate, Cyclic citrul peptide antibody, IgG, Rheumatoid factor, ANA, Lyme Aby, Western Blot IgG & IgM w/bands  Venous stasis dermatitis - Plan: CBC with Differential, Hepatic function panel, Basic metabolic panel, TSH, Sedimentation rate, Cyclic citrul peptide antibody, IgG, Rheumatoid factor, ANA, Lyme Aby, Western Blot IgG & IgM w/bands  Plan: Meds ordered this encounter  Medications  . Cholecalciferol (VITAMIN D) 2000 UNITS CAPS    Sig: Take by mouth daily.  Marland Kitchen DISCONTD:  HYDROcodone-acetaminophen (NORCO/VICODIN) 5-325 MG per tablet    Sig: Take 1 tablet by mouth every 4 (four) hours as needed for severe pain.    Dispense:  60 tablet    Refill:  0    Order Specific Question:  Supervising Provider    Answer:  Mikey Kirschner [[id number]]  . triamcinolone cream (KENALOG) 0.1 %    Sig: Apply 1 application topically 2 (two) times daily. Prn rash    Dispense:  45 g    Refill:  0    Order Specific Question:  Supervising Provider    Answer:  Mikey Kirschner [[id number]]  . meloxicam (MOBIC) 15 MG tablet    Sig: Take 1 tablet (15 mg total) by mouth daily. Prn pain    Dispense:  30 tablet    Refill:  5    Order Specific Question:  Supervising Provider    Answer:  Mikey Kirschner [[id number]]  . DISCONTD: HYDROcodone-acetaminophen (NORCO/VICODIN) 5-325 MG per tablet    Sig: Take 1 tablet by mouth every 4 (four) hours as needed for severe pain.    Dispense:  60 tablet    Refill:  0    May fill 30 days from 04/09/13    Order Specific Question:  Supervising Provider    Answer:  Mikey Kirschner [[id number]]  . HYDROcodone-acetaminophen (NORCO/VICODIN) 5-325 MG per tablet    Sig: Take 1 tablet by mouth every 4 (four) hours as needed for severe pain.    Dispense:  60 tablet    Refill:  0    May fill 60 days from 04/09/13    Order Specific Question:  Supervising Provider    Answer:  Mikey Kirschner [[id number]]  Activity as tolerated. Further followup based on test results.

## 2013-04-19 ENCOUNTER — Telehealth: Payer: Self-pay | Admitting: Family Medicine

## 2013-04-19 NOTE — Telephone Encounter (Signed)
Patient notified and verbalized understanding. I transferred her up front to make a f/u appt.

## 2013-04-19 NOTE — Telephone Encounter (Signed)
Patient calling to get results to blood work.

## 2013-04-19 NOTE — Telephone Encounter (Signed)
Advise pt that most of her blood tests are fine, I totally support [name] doing the blood work but there are some tests here that i do not geerally order. One tiny positive for one variant of lyme disease is likely not true positive. In addition the vast majority of positive ANAs are what we call false positive and not sign of true lupus. Sincle carolyn did all these tests we need to address, rec f u with myself or carolyn next wk to assess ongoing symptomatology and whether further work up is warranted

## 2013-04-28 ENCOUNTER — Other Ambulatory Visit: Payer: Self-pay | Admitting: Family Medicine

## 2013-04-30 ENCOUNTER — Encounter: Payer: Self-pay | Admitting: Nurse Practitioner

## 2013-04-30 ENCOUNTER — Ambulatory Visit (INDEPENDENT_AMBULATORY_CARE_PROVIDER_SITE_OTHER): Payer: Medicare Other | Admitting: Nurse Practitioner

## 2013-04-30 VITALS — BP 128/82 | Ht 64.0 in | Wt 205.0 lb

## 2013-04-30 DIAGNOSIS — M1990 Unspecified osteoarthritis, unspecified site: Secondary | ICD-10-CM

## 2013-05-04 ENCOUNTER — Encounter: Payer: Self-pay | Admitting: Nurse Practitioner

## 2013-05-04 NOTE — Progress Notes (Signed)
Subjective:  Presents review her most recent lab work. Continues to have chronic arthritis pain. No fever. No erythema or warmth in the joints. Was recently started on low-dose Neurontin by her podiatrist.  Objective:   BP 128/82  Ht 5\' 4"  (1.626 m)  Wt 205 lb (92.987 kg)  BMI 35.17 kg/m2 NAD. Alert, oriented. Lungs clear. Heart regular rhythm. See labs dated 04/09/13. Reviewed results with Richardson Landry, no further workup or referral needed at this point since sedimentation rate is normal.  Assessment: Problem List Items Addressed This Visit     Musculoskeletal and Integument   Degenerative arthritis - Primary (Chronic)     Plan: Continue current regimen for pain control. Making to slowly titrate Neurontin dose to see if this will help some of her back pain. Routine followup is planned.

## 2013-06-21 ENCOUNTER — Other Ambulatory Visit: Payer: Self-pay | Admitting: Family Medicine

## 2013-07-07 ENCOUNTER — Ambulatory Visit: Payer: Medicare Other | Admitting: Nurse Practitioner

## 2013-07-12 ENCOUNTER — Ambulatory Visit (INDEPENDENT_AMBULATORY_CARE_PROVIDER_SITE_OTHER): Payer: Medicare Other | Admitting: Nurse Practitioner

## 2013-07-12 ENCOUNTER — Encounter: Payer: Self-pay | Admitting: Nurse Practitioner

## 2013-07-12 VITALS — BP 148/90 | Ht 64.0 in | Wt 209.0 lb

## 2013-07-12 DIAGNOSIS — M150 Primary generalized (osteo)arthritis: Secondary | ICD-10-CM

## 2013-07-12 DIAGNOSIS — I10 Essential (primary) hypertension: Secondary | ICD-10-CM

## 2013-07-12 DIAGNOSIS — M159 Polyosteoarthritis, unspecified: Secondary | ICD-10-CM

## 2013-07-12 DIAGNOSIS — R7309 Other abnormal glucose: Secondary | ICD-10-CM

## 2013-07-12 DIAGNOSIS — R7303 Prediabetes: Secondary | ICD-10-CM

## 2013-07-12 LAB — POCT GLYCOSYLATED HEMOGLOBIN (HGB A1C): HEMOGLOBIN A1C: 5.9

## 2013-07-12 MED ORDER — HYDROCODONE-ACETAMINOPHEN 5-325 MG PO TABS: 1.0000 | ORAL_TABLET | ORAL | Status: DC | PRN

## 2013-07-12 MED ORDER — POTASSIUM CHLORIDE CRYS ER 20 MEQ PO TBCR: EXTENDED_RELEASE_TABLET | ORAL | Status: DC

## 2013-07-12 MED ORDER — FUROSEMIDE 40 MG PO TABS: ORAL_TABLET | ORAL | Status: DC

## 2013-07-12 MED ORDER — LOSARTAN POTASSIUM 50 MG PO TABS: ORAL_TABLET | ORAL | Status: DC

## 2013-07-12 MED ORDER — GABAPENTIN 100 MG PO CAPS: 100.0000 mg | ORAL_CAPSULE | Freq: Three times a day (TID) | ORAL | Status: DC

## 2013-07-12 MED ORDER — METFORMIN HCL 500 MG PO TABS: ORAL_TABLET | ORAL | Status: DC

## 2013-07-12 MED ORDER — MELOXICAM 15 MG PO TABS: 15.0000 mg | ORAL_TABLET | Freq: Every day | ORAL | Status: DC

## 2013-07-14 ENCOUNTER — Encounter: Payer: Self-pay | Admitting: Nurse Practitioner

## 2013-07-14 NOTE — Progress Notes (Signed)
Subjective:  Presents for routine followup. No chest pain/ischemic type pain or shortness of breath. Mild off and on swelling in the lower extremities especially with hot weather. Lasix does help. Some sodium in her diet, for example this morning she had a country ham biscuit. Meloxicam does help her chronic arthritis pain. Did not take her third prescription for hydrocodone, patient brought this back in today for shredding. Had some slight nausea, only occurs in the morning unassociated with any particular foods. Afternoon and evening appetite slowly improves. No abdominal pain, fever, acid reflux or heartburn. Bowels normal. Does some walking mainly in the mornings.  Objective:   BP 148/90  Ht 5\' 4"  (1.626 m)  Wt 209 lb (94.802 kg)  BMI 35.86 kg/m2 NAD. Alert, oriented. Lungs clear. Heart regular rate rhythm, no change in her murmur. Abdomen obese soft nondistended nontender. Lower extremities trace pitting edema. See labs 04/09/13.  Results for orders placed in visit on 07/12/13  POCT GLYCOSYLATED HEMOGLOBIN (HGB A1C)      Result Value Ref Range   Hemoglobin A1C 5.9       Assessment:  Problem List Items Addressed This Visit     Cardiovascular and Mediastinum   Essential hypertension, benign - Primary (Chronic)   Relevant Medications      losartan (COZAAR) tablet      furosemide (LASIX) tablet     Endocrine   Prediabetes (Chronic)   Relevant Orders      POCT glycosylated hemoglobin (Hb A1C) (Completed)     Musculoskeletal and Integument   Degenerative arthritis (Chronic)   Relevant Medications      meloxicam (MOBIC) tablet      HYDROcodone-acetaminophen (NORCO/VICODIN) 5-325 MG per tablet     Nausea most likely secondary to medications (only in AM)  Plan:  Meds ordered this encounter  Medications  . DISCONTD: HYDROcodone-acetaminophen (NORCO/VICODIN) 5-325 MG per tablet    Sig: Take 1 tablet by mouth every 4 (four) hours as needed for severe pain.    Dispense:  60 tablet     Refill:  0    Order Specific Question:  Supervising Provider    Answer:  Mikey Kirschner [[id number]]  . meloxicam (MOBIC) 15 MG tablet    Sig: Take 1 tablet (15 mg total) by mouth daily. Prn pain    Dispense:  30 tablet    Refill:  5    Order Specific Question:  Supervising Provider    Answer:  Mikey Kirschner [[id number]]  . potassium chloride SA (K-DUR,KLOR-CON) 20 MEQ tablet    Sig: TAKE 1 TABLET BY MOUTH ONCE A DAY.    Dispense:  90 tablet    Refill:  1    Order Specific Question:  Supervising Provider    Answer:  Mikey Kirschner [[id number]]  . metFORMIN (GLUCOPHAGE) 500 MG tablet    Sig: TAKE (1) TABLET BY MOUTH TWICE DAILY.    Dispense:  180 tablet    Refill:  1    Order Specific Question:  Supervising Provider    Answer:  Mikey Kirschner [[id number]]  . losartan (COZAAR) 50 MG tablet    Sig: TAKE ONE TABLET BY MOUTH ONCE DAILY.    Dispense:  90 tablet    Refill:  1    Order Specific Question:  Supervising Provider    Answer:  Mikey Kirschner [[id number]]  . gabapentin (NEURONTIN) 100 MG capsule    Sig: Take 1 capsule (100 mg total) by mouth 3 (three) times daily.  Dispense:  180 capsule    Refill:  1    Order Specific Question:  Supervising Provider    Answer:  Mikey Kirschner [[id number]]  . furosemide (LASIX) 40 MG tablet    Sig: TAKE 1 TABLET BY MOUTH ONCE A DAY.    Dispense:  90 tablet    Refill:  1    Order Specific Question:  Supervising Provider    Answer:  Mikey Kirschner [[id number]]  . DISCONTD: HYDROcodone-acetaminophen (NORCO/VICODIN) 5-325 MG per tablet    Sig: Take 1 tablet by mouth every 4 (four) hours as needed for severe pain.    Dispense:  60 tablet    Refill:  0    May fill 30 days from 07/12/13    Order Specific Question:  Supervising Provider    Answer:  Mikey Kirschner [[id number]]  . HYDROcodone-acetaminophen (NORCO/VICODIN) 5-325 MG per tablet    Sig: Take 1 tablet by mouth every 4 (four) hours as needed for severe pain.    Dispense:  60 tablet    Refill:  0     May fill 60 days from 07/12/13    Order Specific Question:  Supervising Provider    Answer:  Mikey Kirschner [[id number]]   Cautioned about excess Sodium intake. Patient defers Zostavax. Is due for her preventive health physical. Return in about 3 months (around 10/12/2013). Call back sooner if any problems.

## 2013-08-09 ENCOUNTER — Other Ambulatory Visit: Payer: Self-pay | Admitting: Surgical

## 2013-10-12 ENCOUNTER — Ambulatory Visit: Payer: Medicare Other | Admitting: Nurse Practitioner

## 2013-10-13 ENCOUNTER — Ambulatory Visit (INDEPENDENT_AMBULATORY_CARE_PROVIDER_SITE_OTHER): Payer: Medicare Other | Admitting: Nurse Practitioner

## 2013-10-13 ENCOUNTER — Encounter: Payer: Self-pay | Admitting: Nurse Practitioner

## 2013-10-13 VITALS — BP 150/90 | Ht 64.0 in | Wt 204.0 lb

## 2013-10-13 DIAGNOSIS — Z23 Encounter for immunization: Secondary | ICD-10-CM

## 2013-10-13 DIAGNOSIS — I359 Nonrheumatic aortic valve disorder, unspecified: Secondary | ICD-10-CM

## 2013-10-13 DIAGNOSIS — I10 Essential (primary) hypertension: Secondary | ICD-10-CM

## 2013-10-13 DIAGNOSIS — M159 Polyosteoarthritis, unspecified: Secondary | ICD-10-CM

## 2013-10-13 DIAGNOSIS — M150 Primary generalized (osteo)arthritis: Principal | ICD-10-CM

## 2013-10-13 DIAGNOSIS — I350 Nonrheumatic aortic (valve) stenosis: Secondary | ICD-10-CM

## 2013-10-13 MED ORDER — HYDROCODONE-ACETAMINOPHEN 5-325 MG PO TABS
1.0000 | ORAL_TABLET | ORAL | Status: DC | PRN
Start: 2013-10-13 — End: 2013-10-14

## 2013-10-14 ENCOUNTER — Encounter (HOSPITAL_COMMUNITY): Payer: Self-pay | Admitting: Pharmacy Technician

## 2013-10-15 ENCOUNTER — Encounter: Payer: Self-pay | Admitting: Nurse Practitioner

## 2013-10-15 NOTE — Progress Notes (Signed)
Subjective:  Presents for routine followup and preop clearance for surgery on 10/7, having left hip replacement. Blood sugars have been good at home. No chest pain/ischemic type pain or shortness of breath. Has had to increase her pain medicine lately until her surgery.  Objective:   BP 150/90  Ht 5\' 4"  (1.626 m)  Wt 204 lb (92.534 kg)  BMI 35.00 kg/m2 NAD. Alert, oriented. Lungs clear. Heart regular rate rhythm, no change in murmur. Note the last cardiac echo 11/08/11 was stable. Lower extremities trace pitting edema. Hemoglobin A1c on 6/22 was 5.9.  Assessment:  Problem List Items Addressed This Visit     Cardiovascular and Mediastinum   Essential hypertension, benign (Chronic)   Aortic valve stenosis, mild (Chronic)     Musculoskeletal and Integument   Degenerative arthritis (Chronic)    Other Visit Diagnoses   Need for prophylactic vaccination against Streptococcus pneumoniae (pneumococcus)    -  Primary    Relevant Orders       Pneumococcal conjugate vaccine 13-valent (Completed)    Need for prophylactic vaccination and inoculation against influenza            Plan:  Meds ordered this encounter  Medications  . DISCONTD: HYDROcodone-acetaminophen (NORCO/VICODIN) 5-325 MG per tablet    Sig: Take 1 tablet by mouth every 4 (four) hours as needed for severe pain.    Dispense:  90 tablet    Refill:  0    Order Specific Question:  Supervising Provider    Answer:  Mikey Kirschner [[id number]]   Increase hydrocodone to 3 times a day until her hip replacement. Postop pain management to be handled by her specialist. Flu vaccine and pneumonia vaccine today. All of patient's chronic health problems are stable. Nothing has been noted that would prohibit surgery. Return in about 3 months (around 01/12/2014).

## 2013-10-20 ENCOUNTER — Other Ambulatory Visit (HOSPITAL_COMMUNITY): Payer: Self-pay | Admitting: *Deleted

## 2013-10-21 ENCOUNTER — Encounter (HOSPITAL_COMMUNITY): Payer: Self-pay

## 2013-10-21 ENCOUNTER — Other Ambulatory Visit: Payer: Self-pay | Admitting: Surgical

## 2013-10-21 ENCOUNTER — Encounter (HOSPITAL_COMMUNITY)
Admission: RE | Admit: 2013-10-21 | Discharge: 2013-10-21 | Disposition: A | Discharge status: Home / Self Care | Payer: Medicare Other | Source: Ambulatory Visit | Attending: Orthopedic Surgery | Admitting: Orthopedic Surgery

## 2013-10-21 ENCOUNTER — Ambulatory Visit (HOSPITAL_COMMUNITY)
Admission: RE | Admit: 2013-10-21 | Discharge: 2013-10-21 | Disposition: A | Discharge status: Home / Self Care | Payer: Medicare Other | Source: Ambulatory Visit | Attending: Surgical | Admitting: Surgical

## 2013-10-21 DIAGNOSIS — M1612 Unilateral primary osteoarthritis, left hip: Secondary | ICD-10-CM | POA: Insufficient documentation

## 2013-10-21 DIAGNOSIS — I10 Essential (primary) hypertension: Secondary | ICD-10-CM | POA: Diagnosis not present

## 2013-10-21 DIAGNOSIS — Z01818 Encounter for other preprocedural examination: Secondary | ICD-10-CM | POA: Insufficient documentation

## 2013-10-21 HISTORY — DX: Other specified postprocedural states: Z98.890

## 2013-10-21 HISTORY — DX: Unspecified osteoarthritis, unspecified site: M19.90

## 2013-10-21 HISTORY — DX: Nausea with vomiting, unspecified: R11.2

## 2013-10-21 LAB — CBC
HCT: 39.6 % (ref 36.0–46.0)
Hemoglobin: 12.8 g/dL (ref 12.0–15.0)
MCH: 27.9 pg (ref 26.0–34.0)
MCHC: 32.3 g/dL (ref 30.0–36.0)
MCV: 86.3 fL (ref 78.0–100.0)
Platelets: 197 10*3/uL (ref 150–400)
RBC: 4.59 MIL/uL (ref 3.87–5.11)
RDW: 12.6 % (ref 11.5–15.5)
WBC: 7.2 10*3/uL (ref 4.0–10.5)

## 2013-10-21 LAB — URINALYSIS, ROUTINE W REFLEX MICROSCOPIC
Bilirubin Urine: NEGATIVE
Glucose, UA: NEGATIVE mg/dL
Hgb urine dipstick: NEGATIVE
Ketones, ur: NEGATIVE mg/dL
Leukocytes, UA: NEGATIVE
Nitrite: NEGATIVE
Protein, ur: NEGATIVE mg/dL
Specific Gravity, Urine: 1.013 (ref 1.005–1.030)
Urobilinogen, UA: 0.2 mg/dL (ref 0.0–1.0)
pH: 5.5 (ref 5.0–8.0)

## 2013-10-21 LAB — COMPREHENSIVE METABOLIC PANEL
ALT: 22 U/L (ref 0–35)
AST: 28 U/L (ref 0–37)
Albumin: 4 g/dL (ref 3.5–5.2)
Alkaline Phosphatase: 69 U/L (ref 39–117)
Anion gap: 13 (ref 5–15)
BUN: 18 mg/dL (ref 6–23)
CO2: 28 mEq/L (ref 19–32)
Calcium: 9.7 mg/dL (ref 8.4–10.5)
Chloride: 102 mEq/L (ref 96–112)
Creatinine, Ser: 0.85 mg/dL (ref 0.50–1.10)
GFR calc Af Amer: 76 mL/min — ABNORMAL LOW (ref >90)
GFR calc non Af Amer: 66 mL/min — ABNORMAL LOW (ref >90)
Glucose, Bld: 111 mg/dL — ABNORMAL HIGH (ref 70–99)
Potassium: 5.1 mEq/L (ref 3.7–5.3)
Sodium: 143 mEq/L (ref 137–147)
Total Bilirubin: 0.3 mg/dL (ref 0.3–1.2)
Total Protein: 7.4 g/dL (ref 6.0–8.3)

## 2013-10-21 LAB — SURGICAL PCR SCREEN
MRSA, PCR: NEGATIVE
STAPHYLOCOCCUS AUREUS: NEGATIVE

## 2013-10-21 LAB — PROTIME-INR
INR: 1.03 (ref 0.00–1.49)
Prothrombin Time: 13.6 seconds (ref 11.6–15.2)

## 2013-10-21 LAB — APTT: aPTT: 32 seconds (ref 24–37)

## 2013-10-21 NOTE — Patient Instructions (Signed)
20     Your procedure is scheduled on:   Wednesday 10/27/2013  Report to [facility] Main Entrance and follow signs to Short Stay  at  0900 AM.  Call this number if you have problems the night before or morning of surgery:  [phone]   Remember:          Do not eat food or drink liquids AFTER MIDNIGHT!  Take these medicines the morning of surgery with A SIP OF WATER:  Gabapentin    [facility] IS NOT RESPONSIBLE FOR ANY BELONGINGS OR VALUABLES BROUGHT TO HOSPITAL.  Marland Kitchen  Leave suitcase in the car. After surgery it may be brought to your room.  For patients admitted to the hospital, checkout time is 11:00 AM the day of              Discharge.    DO NOT WEAR  JEWELRY,MAKE-UP,LOTIONS,POWDERS,PERFUMES,CONTACTS , DENTURES OR BRIDGEWORK ,AND DO NOT WEAR FALSE EYELASHES                                    Patients discharged the day of surgery will not be allowed to drive home.  If going home the same day of surgery, must have someone stay with you  first 24 hrs.at home and arrange for someone to drive you home from the  [place]: N/A   Special Instructions:              Please read over the following fact sheets that you were given:             1. [place] - Preparing for Surgery Before surgery, you can play an important role.  Because skin is not sterile, your skin needs to be as free of germs as possible.  You can reduce the number of germs on your skin by washing with CHG (chlorahexidine gluconate) soap before surgery.  CHG is an antiseptic cleaner which kills germs and bonds with the skin to continue killing germs even after washing. Please DO NOT use if you have an allergy to CHG or antibacterial soaps.  If your skin becomes reddened/irritated stop using the CHG and inform your nurse when you arrive at Short  Stay. Do not shave (including legs and underarms) for at least 48 hours prior to the first CHG shower.  You may shave your face/neck. Please follow these instructions carefully:  1.  Shower with CHG Soap the night before surgery and the  morning of Surgery.  2.  If you choose to wash your hair, wash your hair first as usual with your  normal  shampoo.  3.  After you shampoo, rinse your hair and body thoroughly to remove the  shampoo.  4.  Use CHG as you would any other liquid soap.  You can apply chg directly  to the skin and wash                       Gently with a scrungie or clean washcloth.  5.  Apply the CHG Soap to your body ONLY FROM THE NECK DOWN.   Do not use on face/ open                           Wound or open sores. Avoid contact with eyes, ears mouth and genitals (private parts).                       Wash face,  Genitals (private parts) with your normal soap.             6.  Wash thoroughly, paying special attention to the area where your surgery  will be performed.  7.  Thoroughly rinse your body with warm water from the neck down.  8.  DO NOT shower/wash with your normal soap after using and rinsing off  the CHG Soap.                9.  Pat yourself dry with a clean towel.            10.  Wear clean pajamas.            11.  Place clean sheets on your bed the night of your first shower and do not  sleep with pets. Day of Surgery : Do not apply any lotions/deodorants the morning of surgery.  Please wear clean clothes to the hospital/surgery center.  FAILURE TO FOLLOW THESE INSTRUCTIONS MAY RESULT IN THE CANCELLATION OF YOUR SURGERY PATIENT SIGNATURE_________________________________  NURSE SIGNATURE__________________________________  ________________________________________________________________________   Adam Phenix  An incentive spirometer is a tool that can help keep your lungs clear and active. This tool measures how well you are  filling your lungs with each breath. Taking long deep breaths may help reverse or decrease the chance of developing breathing (pulmonary) problems (especially infection) following:  A long period of time when you are unable to move or be active. BEFORE THE PROCEDURE   If the spirometer includes an indicator to show your best effort, your nurse or respiratory therapist will set it to a desired goal.  If possible, sit up straight or lean slightly forward. Try not to slouch.  Hold the incentive spirometer in an upright position. INSTRUCTIONS FOR USE  1. Sit on the edge of your bed if possible, or sit up as far as you can in bed or on a chair. 2. Hold the incentive spirometer in an upright position. 3. Breathe out normally. 4. Place the mouthpiece in your mouth and seal your lips tightly around it. 5. Breathe in slowly and as deeply as possible, raising the piston or the ball toward the top of the column. 6. Hold your breath for 3-5 seconds or for as long as possible. Allow the piston or ball to fall to the bottom of the column. 7. Remove the mouthpiece from your mouth and breathe out normally. 8. Rest for a few seconds and repeat Steps 1 through 7 at least 10 times every 1-2 hours when you are awake. Take your time and take a few normal breaths between deep breaths. 9. The spirometer may include an indicator to show  your best effort. Use the indicator as a goal to work toward during each repetition. 10. After each set of 10 deep breaths, practice coughing to be sure your lungs are clear. If you have an incision (the cut made at the time of surgery), support your incision when coughing by placing a pillow or rolled up towels firmly against it. Once you are able to get out of bed, walk around indoors and cough well. You may stop using the incentive spirometer when instructed by your caregiver.  RISKS AND COMPLICATIONS  Take your time so you do not get dizzy or light-headed.  If you are in pain,  you may need to take or ask for pain medication before doing incentive spirometry. It is harder to take a deep breath if you are having pain. AFTER USE  Rest and breathe slowly and easily.  It can be helpful to keep track of a log of your progress. Your caregiver can provide you with a simple table to help with this. If you are using the spirometer at home, follow these instructions: [place] IF:   You are having difficultly using the spirometer.  You have trouble using the spirometer as often as instructed.  Your pain medication is not giving enough relief while using the spirometer.  You develop fever of 100.5 F (38.1 C) or higher. SEEK IMMEDIATE MEDICAL CARE IF:   You cough up bloody sputum that had not been present before.  You develop fever of 102 F (38.9 C) or greater.  You develop worsening pain at or near the incision site. MAKE SURE YOU:   Understand these instructions.  Will watch your condition.  Will get help right away if you are not doing well or get worse. Document Released: 05/20/2006 Document Revised: 04/01/2011 Document Reviewed: 07/21/2006 Exit[employer] Patient Information 2014 [employer], [place].   ________________________________________________________________________  WHAT IS A BLOOD TRANSFUSION? Blood Transfusion Information  A transfusion is the replacement of blood or some of its parts. Blood is made up of multiple cells which provide different functions.  Red blood cells carry oxygen and are used for blood loss replacement.  White blood cells fight against infection.  Platelets control bleeding.  Plasma helps clot blood.  Other blood products are available for specialized needs, such as hemophilia or other clotting disorders. BEFORE THE TRANSFUSION  Who gives blood for transfusions?   Healthy volunteers who are fully evaluated to make sure their blood is safe. This is blood bank blood. Transfusion therapy is the safest it has ever  been in the practice of medicine. Before blood is taken from a donor, a complete history is taken to make sure that person has no history of diseases nor engages in risky social behavior (examples are intravenous drug use or sexual activity with multiple partners). The donor's travel history is screened to minimize risk of transmitting infections, such as malaria. The donated blood is tested for signs of infectious diseases, such as HIV and hepatitis. The blood is then tested to be sure it is compatible with you in order to minimize the chance of a transfusion reaction. If you or a relative donates blood, this is often done in anticipation of surgery and is not appropriate for emergency situations. It takes many days to process the donated blood. RISKS AND COMPLICATIONS Although transfusion therapy is very safe and saves many lives, the main dangers of transfusion include:   Getting an infectious disease.  Developing a transfusion reaction. This is an allergic reaction to  something in the blood you were given. Every precaution is taken to prevent this. The decision to have a blood transfusion has been considered carefully by your caregiver before blood is given. Blood is not given unless the benefits outweigh the risks. AFTER THE TRANSFUSION  Right after receiving a blood transfusion, you will usually feel much better and more energetic. This is especially true if your red blood cells have gotten low (anemic). The transfusion raises the level of the red blood cells which carry oxygen, and this usually causes an energy increase.  The nurse administering the transfusion will monitor you carefully for complications. HOME CARE INSTRUCTIONS  No special instructions are needed after a transfusion. You may find your energy is better. Speak with your caregiver about any limitations on activity for underlying diseases you may have. SEEK MEDICAL CARE IF:   Your condition is not improving after your  transfusion.  You develop redness or irritation at the intravenous (IV) site. SEEK IMMEDIATE MEDICAL CARE IF:  Any of the following symptoms occur over the next 12 hours:  Shaking chills.  You have a temperature by mouth above 102 F (38.9 C), not controlled by medicine.  Chest, back, or muscle pain.  People around you feel you are not acting correctly or are confused.  Shortness of breath or difficulty breathing.  Dizziness and fainting.  You get a rash or develop hives.  You have a decrease in urine output.  Your urine turns a dark color or changes to pink, red, or brown. Any of the following symptoms occur over the next 10 days:  You have a temperature by mouth above 102 F (38.9 C), not controlled by medicine.  Shortness of breath.  Weakness after normal activity.  The white part of the eye turns yellow (jaundice).  You have a decrease in the amount of urine or are urinating less often.  Your urine turns a dark color or changes to pink, red, or brown. Document Released: 01/05/2000 Document Revised: 04/01/2011 Document Reviewed: 08/24/2007 [facility] Patient Information 2014 [place], [place].  _______________________________________________________________________

## 2013-10-21 NOTE — H&P (Signed)
TOTAL HIP ADMISSION H&P  Patient is admitted for left total hip arthroplasty.  Subjective:  Chief Complaint: left hip pain  HPI: [name], 73 y.o. female, has a history of pain and functional disability in the left hip(s) due to arthritis and patient has failed non-surgical conservative treatments for greater than 12 weeks to include NSAID's and/or analgesics, corticosteriod injections and activity modification.  Onset of symptoms was gradual starting 2 years ago with stable course since that time.The patient noted no past surgery on the left hip(s).  Patient currently rates pain in the left hip at 7 out of 10 with activity. Patient has night pain, worsening of pain with activity and weight bearing, pain that interfers with activities of daily living and pain with passive range of motion. Patient has evidence of subchondral cysts, periarticular osteophytes and joint space narrowing by imaging studies. This condition presents safety issues increasing the risk of falls. There is no current active infection.  Patient Active Problem List   Diagnosis Date Noted  . Degenerative arthritis 12/28/2012  . Aortic valve stenosis, mild 07/30/2012  . Unspecified vitamin D deficiency 07/30/2012  . Prediabetes 07/06/2012  . Essential hypertension, benign 07/06/2012  . Hyperlipemia 07/06/2012  . COLONIC POLYPS, HX OF 02/27/2009  . COLITIS 08/23/2008  . CARDIAC MURMUR 08/02/2008  . DIARRHEA 08/02/2008   Past Medical History  Diagnosis Date  . IFG (impaired fasting glucose)   . Hypertension   . Aortic stenosis, mild   . Aortic valve stenosis, mild   . Vitamin D deficiency     Past Surgical History  Procedure Laterality Date  . Abdominal hysterectomy    . Tonsillectomy    . Colonoscopy    . Knee surgery Left   . Total knee arthroplasty Right      Current outpatient prescriptions: aspirin 81 MG tablet, Take 81 mg by mouth daily., Disp: , Rfl: ;   CALCIUM-MAGNESIUM-ZINC PO, Take 1 tablet  by mouth daily., Disp: , Rfl: ;   Cholecalciferol (VITAMIN D) 2000 UNITS CAPS, Take 1 capsule by mouth at bedtime., Disp: , Rfl: ;   furosemide (LASIX) 40 MG tablet, Take 40 mg by mouth every morning., Disp: , Rfl: ;   gabapentin (NEURONTIN) 300 MG capsule, Take 300 mg by mouth 3 (three) times daily., Disp: , Rfl:  HYDROcodone-acetaminophen (NORCO/VICODIN) 5-325 MG per tablet, Take 1 tablet by mouth every 4 (four) hours as needed for moderate pain., Disp: , Rfl: ;   losartan (COZAAR) 50 MG tablet, Take 50 mg by mouth every morning., Disp: , Rfl: ;   meloxicam (MOBIC) 15 MG tablet, Take 15 mg by mouth once as needed for pain., Disp: , Rfl: ;   metFORMIN (GLUCOPHAGE) 500 MG tablet, Take 500 mg by mouth 2 (two) times daily with a meal., Disp: , Rfl:  potassium chloride SA (K-DUR,KLOR-CON) 20 MEQ tablet, Take 20 mEq by mouth daily., Disp: , Rfl: ;  triamcinolone cream (KENALOG) 0.1 %, Apply 1 application topically 2 (two) times daily as needed (rash)., Disp: , Rfl:   Allergies  Allergen Reactions  . Doxycycline     Gi upset  . Lodine [Etodolac]     Gi upset  . Nabumetone   . Tetracycline     History  Substance Use Topics  . Smoking status: Never Smoker   . Smokeless tobacco: No  . Alcohol Use: No    Family History  Problem Relation Age of Onset  . Hypertension Mother   . Hypertension Father   .  Cancer Sister     Breast  . Hypertension Brother      Review of Systems  Constitutional: Negative.   HENT: Negative.   Eyes: Negative.   Respiratory: Negative.   Cardiovascular: Negative.   Gastrointestinal: Negative.   Genitourinary: Negative.   Musculoskeletal: Positive for back pain and joint pain. Negative for falls, myalgias and neck pain.       Left hip pain  Skin: Negative.   Neurological: Negative.   Endo/Heme/Allergies: Negative.   Psychiatric/Behavioral: Negative.     Objective:  Physical Exam  Constitutional: She is oriented to person, place, and time. She appears  well-developed. No distress.  Obese  HENT:  Head: Normocephalic and atraumatic.  Right Ear: External ear normal.  Left Ear: External ear normal.  Nose: Nose normal.  Mouth/Throat: Oropharynx is clear and moist.  Eyes: Conjunctivae and EOM are normal.  Neck: Normal range of motion. Neck supple.  Cardiovascular: Normal rate, regular rhythm and intact distal pulses.   Murmur heard.  Systolic murmur is present  Respiratory: Effort normal and breath sounds normal. No respiratory distress. She has no wheezes.  GI: Soft. Bowel sounds are normal. She exhibits no distension. There is no tenderness.  Musculoskeletal:       Right hip: Normal.       Left hip: She exhibits decreased range of motion and decreased strength.       Right knee: Normal.       Left knee: Normal.       Right lower leg: She exhibits no tenderness and no swelling.       Left lower leg: She exhibits no tenderness and no swelling.  Neurological: She is alert and oriented to person, place, and time. She has normal strength and normal reflexes. No sensory deficit.  Skin: No rash noted. She is not diaphoretic. No erythema.  Psychiatric: She has a normal mood and affect. Her behavior is normal.     Vitals  Weight: 203 lb Height: 64in Body Surface Area: 2.04 m Body Mass Index: 34.84 kg/m Pulse: 80 (Regular)  BP: 126/70 (Sitting, Left Arm, Standard)  Imaging Review Plain radiographs demonstrate severe degenerative joint disease of the left hip(s). The bone quality appears to be good for age and reported activity level.  Assessment/Plan:  End stage arthritis, left hip(s)  The patient history, physical examination, clinical judgement of the provider and imaging studies are consistent with end stage degenerative joint disease of the left hip(s) and total hip arthroplasty is deemed medically necessary. The treatment options including medical management, injection therapy, arthroscopy and arthroplasty were discussed  at length. The risks and benefits of total hip arthroplasty were presented and reviewed. The risks due to aseptic loosening, infection, stiffness, dislocation/subluxation,  thromboembolic complications and other imponderables were discussed.  The patient acknowledged the explanation, agreed to proceed with the plan and consent was signed. Patient is being admitted for inpatient treatment for surgery, pain control, PT, OT, prophylactic antibiotics, VTE prophylaxis, progressive ambulation and ADL's and discharge planning.The patient is planning to be discharged to skilled nursing facility [facility])  PCP: Rosemary Holms TXA  Ardeen Jourdain, PA-C

## 2013-10-21 NOTE — Progress Notes (Signed)
10/21/13 1128  OBSTRUCTIVE SLEEP APNEA  Have you ever been diagnosed with sleep apnea through a sleep study? No  Do you snore loudly (loud enough to be heard through closed doors)?  0  Do you often feel tired, fatigued, or sleepy during the daytime? 1  Has anyone observed you stop breathing during your sleep? 0  Do you have, or are you being treated for high blood pressure? 1  BMI more than 35 kg/m2? 1  Age over 74 years old? 1  Neck circumference greater than 40 cm/16 inches? 0  Gender: 0  Obstructive Sleep Apnea Score 4  Score 4 or greater  Results sent to PCP

## 2013-10-27 ENCOUNTER — Encounter (HOSPITAL_COMMUNITY)
Admission: RE | Disposition: A | Discharge status: Home Health | Payer: Self-pay | Source: Ambulatory Visit | Attending: Orthopedic Surgery

## 2013-10-27 ENCOUNTER — Inpatient Hospital Stay (HOSPITAL_COMMUNITY)
Admission: RE | Admit: 2013-10-27 | Discharge: 2013-10-29 | DRG: 470 | Disposition: A | Discharge status: Home Health | Payer: Medicare Other | Source: Ambulatory Visit | Attending: Orthopedic Surgery | Admitting: Orthopedic Surgery

## 2013-10-27 ENCOUNTER — Encounter (HOSPITAL_COMMUNITY): Payer: Self-pay | Admitting: *Deleted

## 2013-10-27 ENCOUNTER — Encounter (HOSPITAL_COMMUNITY): Payer: Medicare Other | Admitting: Anesthesiology

## 2013-10-27 ENCOUNTER — Inpatient Hospital Stay (HOSPITAL_COMMUNITY): Payer: Medicare Other

## 2013-10-27 ENCOUNTER — Inpatient Hospital Stay (HOSPITAL_COMMUNITY): Payer: Medicare Other | Admitting: Anesthesiology

## 2013-10-27 DIAGNOSIS — M1612 Unilateral primary osteoarthritis, left hip: Secondary | ICD-10-CM | POA: Diagnosis present

## 2013-10-27 DIAGNOSIS — Z881 Allergy status to other antibiotic agents status: Secondary | ICD-10-CM | POA: Diagnosis not present

## 2013-10-27 DIAGNOSIS — M24552 Contracture, left hip: Secondary | ICD-10-CM | POA: Diagnosis present

## 2013-10-27 DIAGNOSIS — M25752 Osteophyte, left hip: Secondary | ICD-10-CM | POA: Diagnosis present

## 2013-10-27 DIAGNOSIS — M247 Protrusio acetabuli: Secondary | ICD-10-CM | POA: Diagnosis present

## 2013-10-27 DIAGNOSIS — Z8249 Family history of ischemic heart disease and other diseases of the circulatory system: Secondary | ICD-10-CM

## 2013-10-27 DIAGNOSIS — E785 Hyperlipidemia, unspecified: Secondary | ICD-10-CM | POA: Diagnosis present

## 2013-10-27 DIAGNOSIS — I350 Nonrheumatic aortic (valve) stenosis: Secondary | ICD-10-CM | POA: Diagnosis present

## 2013-10-27 DIAGNOSIS — Z86010 Personal history of colonic polyps: Secondary | ICD-10-CM

## 2013-10-27 DIAGNOSIS — Z79891 Long term (current) use of opiate analgesic: Secondary | ICD-10-CM | POA: Diagnosis not present

## 2013-10-27 DIAGNOSIS — Z7982 Long term (current) use of aspirin: Secondary | ICD-10-CM | POA: Diagnosis not present

## 2013-10-27 DIAGNOSIS — E559 Vitamin D deficiency, unspecified: Secondary | ICD-10-CM | POA: Diagnosis present

## 2013-10-27 DIAGNOSIS — E669 Obesity, unspecified: Secondary | ICD-10-CM | POA: Diagnosis present

## 2013-10-27 DIAGNOSIS — I10 Essential (primary) hypertension: Secondary | ICD-10-CM | POA: Diagnosis present

## 2013-10-27 DIAGNOSIS — Z6835 Body mass index (BMI) 35.0-35.9, adult: Secondary | ICD-10-CM

## 2013-10-27 DIAGNOSIS — Z888 Allergy status to other drugs, medicaments and biological substances status: Secondary | ICD-10-CM

## 2013-10-27 DIAGNOSIS — Z809 Family history of malignant neoplasm, unspecified: Secondary | ICD-10-CM | POA: Diagnosis not present

## 2013-10-27 DIAGNOSIS — Z96649 Presence of unspecified artificial hip joint: Secondary | ICD-10-CM

## 2013-10-27 DIAGNOSIS — M25552 Pain in left hip: Secondary | ICD-10-CM | POA: Diagnosis present

## 2013-10-27 DIAGNOSIS — Z79899 Other long term (current) drug therapy: Secondary | ICD-10-CM

## 2013-10-27 HISTORY — PX: TOTAL HIP ARTHROPLASTY: SHX124

## 2013-10-27 LAB — TYPE AND SCREEN
ABO/RH(D): B POS
Antibody Screen: NEGATIVE

## 2013-10-27 LAB — GLUCOSE, CAPILLARY
GLUCOSE-CAPILLARY: 148 mg/dL — AB (ref 70–99)
GLUCOSE-CAPILLARY: 147 mg/dL — AB (ref 70–99)
Glucose-Capillary: 117 mg/dL — ABNORMAL HIGH (ref 70–99)

## 2013-10-27 SURGERY — ARTHROPLASTY, HIP, TOTAL,POSTERIOR APPROACH
Anesthesia: General | Site: Hip | Laterality: Left

## 2013-10-27 MED ORDER — NEOSTIGMINE METHYLSULFATE 10 MG/10ML IV SOLN
INTRAVENOUS | Status: AC
  Filled 2013-10-27: qty 1

## 2013-10-27 MED ORDER — HYDROMORPHONE HCL 2 MG/ML IJ SOLN
INTRAMUSCULAR | Status: AC
  Filled 2013-10-27: qty 1

## 2013-10-27 MED ORDER — LOSARTAN POTASSIUM 50 MG PO TABS
50.0000 mg | ORAL_TABLET | Freq: Every morning | ORAL | Status: DC
  Administered 2013-10-28 – 2013-10-29 (×2): 50 mg via ORAL
  Filled 2013-10-27 (×2): qty 1

## 2013-10-27 MED ORDER — BUPIVACAINE HCL (PF) 0.5 % IJ SOLN
INTRAMUSCULAR | Status: AC
  Filled 2013-10-27: qty 30

## 2013-10-27 MED ORDER — SODIUM CHLORIDE 0.9 % IR SOLN
Status: DC | PRN
  Administered 2013-10-27: 12:00:00

## 2013-10-27 MED ORDER — OXYCODONE-ACETAMINOPHEN 5-325 MG PO TABS
2.0000 | ORAL_TABLET | ORAL | Status: DC | PRN
  Administered 2013-10-27 – 2013-10-29 (×5): 2 via ORAL
  Filled 2013-10-27 (×5): qty 2

## 2013-10-27 MED ORDER — LACTATED RINGERS IV SOLN
INTRAVENOUS | Status: DC
  Administered 2013-10-27 – 2013-10-28 (×3): via INTRAVENOUS

## 2013-10-27 MED ORDER — LIDOCAINE HCL (CARDIAC) 20 MG/ML IV SOLN
INTRAVENOUS | Status: AC
  Filled 2013-10-27: qty 5

## 2013-10-27 MED ORDER — LACTATED RINGERS IV SOLN
INTRAVENOUS | Status: DC
  Administered 2013-10-27 (×3): via INTRAVENOUS

## 2013-10-27 MED ORDER — BUPIVACAINE LIPOSOME 1.3 % IJ SUSP
INTRAMUSCULAR | Status: DC | PRN
  Administered 2013-10-27: 20 mL

## 2013-10-27 MED ORDER — BUPIVACAINE HCL (PF) 0.5 % IJ SOLN
INTRAMUSCULAR | Status: DC | PRN
  Administered 2013-10-27: 20 mL

## 2013-10-27 MED ORDER — FUROSEMIDE 40 MG PO TABS
40.0000 mg | ORAL_TABLET | Freq: Every day | ORAL | Status: DC
  Administered 2013-10-28 – 2013-10-29 (×2): 40 mg via ORAL
  Filled 2013-10-27 (×2): qty 1

## 2013-10-27 MED ORDER — THROMBIN 5000 UNITS EX SOLR
CUTANEOUS | Status: AC
  Filled 2013-10-27: qty 5000

## 2013-10-27 MED ORDER — FERROUS SULFATE 325 (65 FE) MG PO TABS
325.0000 mg | ORAL_TABLET | Freq: Three times a day (TID) | ORAL | Status: DC
Start: 2013-10-27 — End: 2013-10-29
  Administered 2013-10-27 – 2013-10-29 (×5): 325 mg via ORAL
  Filled 2013-10-27 (×8): qty 1

## 2013-10-27 MED ORDER — ONDANSETRON HCL 4 MG PO TABS: 4.0000 mg | ORAL_TABLET | Freq: Four times a day (QID) | ORAL | Status: DC | PRN

## 2013-10-27 MED ORDER — OXYCODONE HCL 5 MG/5ML PO SOLN: 5.0000 mg | Freq: Once | ORAL | Status: DC | PRN

## 2013-10-27 MED ORDER — ONDANSETRON HCL 4 MG/2ML IJ SOLN: 4.0000 mg | Freq: Four times a day (QID) | INTRAMUSCULAR | Status: DC | PRN

## 2013-10-27 MED ORDER — HYDROMORPHONE HCL 1 MG/ML IJ SOLN
INTRAMUSCULAR | Status: AC
  Filled 2013-10-27: qty 2

## 2013-10-27 MED ORDER — ALUM & MAG HYDROXIDE-SIMETH 200-200-20 MG/5ML PO SUSP: 30.0000 mL | ORAL | Status: DC | PRN

## 2013-10-27 MED ORDER — FENTANYL CITRATE 0.05 MG/ML IJ SOLN
INTRAMUSCULAR | Status: AC
  Filled 2013-10-27: qty 5

## 2013-10-27 MED ORDER — MEPERIDINE HCL 50 MG/ML IJ SOLN: 6.2500 mg | INTRAMUSCULAR | Status: DC | PRN

## 2013-10-27 MED ORDER — ROCURONIUM BROMIDE 100 MG/10ML IV SOLN
INTRAVENOUS | Status: AC
  Filled 2013-10-27: qty 1

## 2013-10-27 MED ORDER — ONDANSETRON HCL 4 MG/2ML IJ SOLN
INTRAMUSCULAR | Status: AC
  Filled 2013-10-27: qty 2

## 2013-10-27 MED ORDER — SODIUM CHLORIDE 0.9 % IJ SOLN
INTRAMUSCULAR | Status: DC | PRN
  Administered 2013-10-27: 20 mL

## 2013-10-27 MED ORDER — HYDROMORPHONE HCL 1 MG/ML IJ SOLN
INTRAMUSCULAR | Status: DC | PRN
  Administered 2013-10-27: 1 mg via INTRAVENOUS

## 2013-10-27 MED ORDER — METFORMIN HCL 500 MG PO TABS
500.0000 mg | ORAL_TABLET | Freq: Two times a day (BID) | ORAL | Status: DC
  Administered 2013-10-27 – 2013-10-29 (×4): 500 mg via ORAL
  Filled 2013-10-27 (×6): qty 1

## 2013-10-27 MED ORDER — SODIUM CHLORIDE 0.9 % IR SOLN
Status: AC
  Filled 2013-10-27: qty 1

## 2013-10-27 MED ORDER — DEXAMETHASONE SODIUM PHOSPHATE 10 MG/ML IJ SOLN
INTRAMUSCULAR | Status: AC
  Filled 2013-10-27: qty 1

## 2013-10-27 MED ORDER — ACETAMINOPHEN 325 MG PO TABS: 650.0000 mg | ORAL_TABLET | Freq: Four times a day (QID) | ORAL | Status: DC | PRN

## 2013-10-27 MED ORDER — INSULIN ASPART 100 UNIT/ML ~~LOC~~ SOLN: 0.0000 [IU] | Freq: Three times a day (TID) | SUBCUTANEOUS | Status: DC

## 2013-10-27 MED ORDER — CEFAZOLIN SODIUM-DEXTROSE 2-3 GM-% IV SOLR
2.0000 g | INTRAVENOUS | Status: AC
  Administered 2013-10-27: 2 g via INTRAVENOUS

## 2013-10-27 MED ORDER — GABAPENTIN 300 MG PO CAPS
300.0000 mg | ORAL_CAPSULE | Freq: Three times a day (TID) | ORAL | Status: DC
  Administered 2013-10-27 – 2013-10-29 (×6): 300 mg via ORAL
  Filled 2013-10-27 (×8): qty 1

## 2013-10-27 MED ORDER — SUCCINYLCHOLINE CHLORIDE 20 MG/ML IJ SOLN
INTRAMUSCULAR | Status: DC | PRN
  Administered 2013-10-27: 80 mg via INTRAVENOUS

## 2013-10-27 MED ORDER — DEXAMETHASONE SODIUM PHOSPHATE 10 MG/ML IJ SOLN
INTRAMUSCULAR | Status: DC | PRN
Start: 2013-10-27 — End: 2013-10-27
  Administered 2013-10-27: 10 mg via INTRAVENOUS

## 2013-10-27 MED ORDER — LIDOCAINE HCL (CARDIAC) 20 MG/ML IV SOLN
INTRAVENOUS | Status: DC | PRN
  Administered 2013-10-27: 100 mg via INTRAVENOUS

## 2013-10-27 MED ORDER — NEOSTIGMINE METHYLSULFATE 10 MG/10ML IV SOLN
INTRAVENOUS | Status: DC | PRN
  Administered 2013-10-27: 5 mg via INTRAVENOUS

## 2013-10-27 MED ORDER — ONDANSETRON HCL 4 MG/2ML IJ SOLN
INTRAMUSCULAR | Status: DC | PRN
  Administered 2013-10-27: 4 mg via INTRAVENOUS

## 2013-10-27 MED ORDER — GLYCOPYRROLATE 0.2 MG/ML IJ SOLN
INTRAMUSCULAR | Status: AC
  Filled 2013-10-27: qty 3

## 2013-10-27 MED ORDER — OXYCODONE HCL 5 MG PO TABS: 5.0000 mg | ORAL_TABLET | Freq: Once | ORAL | Status: DC | PRN

## 2013-10-27 MED ORDER — PROMETHAZINE HCL 25 MG/ML IJ SOLN: 6.2500 mg | INTRAMUSCULAR | Status: DC | PRN

## 2013-10-27 MED ORDER — TRANEXAMIC ACID 100 MG/ML IV SOLN
1000.0000 mg | INTRAVENOUS | Status: AC
  Administered 2013-10-27: 1000 mg via INTRAVENOUS
  Filled 2013-10-27: qty 10

## 2013-10-27 MED ORDER — FLEET ENEMA 7-19 GM/118ML RE ENEM: 1.0000 | ENEMA | Freq: Once | RECTAL | Status: AC | PRN

## 2013-10-27 MED ORDER — POTASSIUM CHLORIDE CRYS ER 20 MEQ PO TBCR
20.0000 meq | EXTENDED_RELEASE_TABLET | Freq: Every day | ORAL | Status: DC
  Administered 2013-10-27 – 2013-10-29 (×3): 20 meq via ORAL
  Filled 2013-10-27 (×3): qty 1

## 2013-10-27 MED ORDER — ROCURONIUM BROMIDE 100 MG/10ML IV SOLN
INTRAVENOUS | Status: DC | PRN
  Administered 2013-10-27: 10 mg via INTRAVENOUS
  Administered 2013-10-27: 40 mg via INTRAVENOUS

## 2013-10-27 MED ORDER — BUPIVACAINE LIPOSOME 1.3 % IJ SUSP
20.0000 mL | Freq: Once | INTRAMUSCULAR | Status: DC
  Filled 2013-10-27: qty 20

## 2013-10-27 MED ORDER — STERILE WATER FOR IRRIGATION IR SOLN
Status: DC | PRN
  Administered 2013-10-27: 1500 mL

## 2013-10-27 MED ORDER — RIVAROXABAN 10 MG PO TABS
10.0000 mg | ORAL_TABLET | Freq: Every day | ORAL | Status: DC
  Administered 2013-10-28 – 2013-10-29 (×2): 10 mg via ORAL
  Filled 2013-10-27 (×3): qty 1

## 2013-10-27 MED ORDER — FENTANYL CITRATE 0.05 MG/ML IJ SOLN
INTRAMUSCULAR | Status: DC | PRN
  Administered 2013-10-27: 150 ug via INTRAVENOUS
  Administered 2013-10-27: 100 ug via INTRAVENOUS

## 2013-10-27 MED ORDER — PHENOL 1.4 % MT LIQD: 1.0000 | OROMUCOSAL | Status: DC | PRN

## 2013-10-27 MED ORDER — PROPOFOL 10 MG/ML IV BOLUS
INTRAVENOUS | Status: AC
  Filled 2013-10-27: qty 20

## 2013-10-27 MED ORDER — METHOCARBAMOL 500 MG PO TABS
500.0000 mg | ORAL_TABLET | Freq: Four times a day (QID) | ORAL | Status: DC | PRN
  Administered 2013-10-27 – 2013-10-29 (×2): 500 mg via ORAL
  Filled 2013-10-27 (×2): qty 1

## 2013-10-27 MED ORDER — HYDROCODONE-ACETAMINOPHEN 5-325 MG PO TABS
1.0000 | ORAL_TABLET | ORAL | Status: DC | PRN
  Administered 2013-10-28 – 2013-10-29 (×2): 1 via ORAL
  Filled 2013-10-27 (×2): qty 1

## 2013-10-27 MED ORDER — PROPOFOL 10 MG/ML IV BOLUS
INTRAVENOUS | Status: DC | PRN
  Administered 2013-10-27: 120 mg via INTRAVENOUS

## 2013-10-27 MED ORDER — MENTHOL 3 MG MT LOZG: 1.0000 | LOZENGE | OROMUCOSAL | Status: DC | PRN

## 2013-10-27 MED ORDER — THROMBIN 5000 UNITS EX SOLR
CUTANEOUS | Status: DC | PRN
  Administered 2013-10-27: 5000 [IU] via TOPICAL

## 2013-10-27 MED ORDER — ACETAMINOPHEN 650 MG RE SUPP: 650.0000 mg | Freq: Four times a day (QID) | RECTAL | Status: DC | PRN

## 2013-10-27 MED ORDER — SODIUM CHLORIDE 0.9 % IJ SOLN
INTRAMUSCULAR | Status: AC
  Filled 2013-10-27: qty 20

## 2013-10-27 MED ORDER — GLYCOPYRROLATE 0.2 MG/ML IJ SOLN
INTRAMUSCULAR | Status: DC | PRN
  Administered 2013-10-27: 0.6 mg via INTRAVENOUS

## 2013-10-27 MED ORDER — CEFAZOLIN SODIUM-DEXTROSE 2-3 GM-% IV SOLR
INTRAVENOUS | Status: AC
  Filled 2013-10-27: qty 50

## 2013-10-27 MED ORDER — BISACODYL 5 MG PO TBEC: 5.0000 mg | DELAYED_RELEASE_TABLET | Freq: Every day | ORAL | Status: DC | PRN

## 2013-10-27 MED ORDER — CEFAZOLIN SODIUM 1-5 GM-% IV SOLN
1.0000 g | Freq: Four times a day (QID) | INTRAVENOUS | Status: AC
  Administered 2013-10-27 (×2): 1 g via INTRAVENOUS
  Filled 2013-10-27 (×2): qty 50

## 2013-10-27 MED ORDER — POLYETHYLENE GLYCOL 3350 17 G PO PACK: 17.0000 g | PACK | Freq: Every day | ORAL | Status: DC | PRN

## 2013-10-27 MED ORDER — METHOCARBAMOL 1000 MG/10ML IJ SOLN
500.0000 mg | Freq: Four times a day (QID) | INTRAMUSCULAR | Status: DC | PRN
  Administered 2013-10-27: 500 mg via INTRAVENOUS
  Filled 2013-10-27: qty 5

## 2013-10-27 MED ORDER — HYDROMORPHONE HCL 1 MG/ML IJ SOLN: 1.0000 mg | INTRAMUSCULAR | Status: DC | PRN

## 2013-10-27 MED ORDER — HYDROMORPHONE HCL 1 MG/ML IJ SOLN
0.2500 mg | INTRAMUSCULAR | Status: DC | PRN
  Administered 2013-10-27 (×3): 0.5 mg via INTRAVENOUS

## 2013-10-27 SURGICAL SUPPLY — 61 items
BAG ZIPLOCK 12X15 (MISCELLANEOUS) ×3 IMPLANT
BLADE SAW SAG 73X25 THK (BLADE) ×2
BLADE SAW SGTL 73X25 THK (BLADE) ×1 IMPLANT
CAPT HIP PF COP ×3 IMPLANT
COVER MAYO STAND STRL (DRAPES) ×3 IMPLANT
DERMABOND ADVANCED (GAUZE/BANDAGES/DRESSINGS) ×2
DERMABOND ADVANCED .7 DNX12 (GAUZE/BANDAGES/DRESSINGS) ×1 IMPLANT
DRAPE INCISE IOBAN 85X60 (DRAPES) ×3 IMPLANT
DRAPE POUCH INSTRU U-SHP 10X18 (DRAPES) ×3 IMPLANT
DRAPE SPLIT 77X100IN (DRAPES) ×6 IMPLANT
DRAPE SURG 17X11 SM STRL (DRAPES) ×3 IMPLANT
DRAPE U-SHAPE 47X51 STRL (DRAPES) ×3 IMPLANT
DRSG ADAPTIC 3X8 NADH LF (GAUZE/BANDAGES/DRESSINGS) IMPLANT
DRSG AQUACEL AG ADV 3.5X10 (GAUZE/BANDAGES/DRESSINGS) IMPLANT
DRSG AQUACEL AG ADV 3.5X14 (GAUZE/BANDAGES/DRESSINGS) ×3 IMPLANT
DRSG PAD ABDOMINAL 8X10 ST (GAUZE/BANDAGES/DRESSINGS) IMPLANT
DRSG TEGADERM 4X4.75 (GAUZE/BANDAGES/DRESSINGS) IMPLANT
DURAPREP 26ML APPLICATOR (WOUND CARE) ×3 IMPLANT
ELECT BLADE TIP CTD 4 INCH (ELECTRODE) ×3 IMPLANT
ELECT REM PT RETURN 9FT ADLT (ELECTROSURGICAL) ×3
ELECTRODE REM PT RTRN 9FT ADLT (ELECTROSURGICAL) ×1 IMPLANT
EVACUATOR 1/8 PVC DRAIN (DRAIN) ×3 IMPLANT
FACESHIELD WRAPAROUND (MASK) ×12 IMPLANT
GAUZE SPONGE 2X2 8PLY STRL LF (GAUZE/BANDAGES/DRESSINGS) IMPLANT
GAUZE SPONGE 4X4 12PLY STRL (GAUZE/BANDAGES/DRESSINGS) IMPLANT
GLOVE BIOGEL PI IND STRL 6.5 (GLOVE) ×1 IMPLANT
GLOVE BIOGEL PI IND STRL 7.0 (GLOVE) ×1 IMPLANT
GLOVE BIOGEL PI IND STRL 8 (GLOVE) ×2 IMPLANT
GLOVE BIOGEL PI INDICATOR 6.5 (GLOVE) ×2
GLOVE BIOGEL PI INDICATOR 7.0 (GLOVE) ×2
GLOVE BIOGEL PI INDICATOR 8 (GLOVE) ×4
GLOVE ECLIPSE 8.0 STRL XLNG CF (GLOVE) ×6 IMPLANT
GLOVE SURG SS PI 6.5 STRL IVOR (GLOVE) ×6 IMPLANT
GOWN STRL REUS W/TWL LRG LVL3 (GOWN DISPOSABLE) ×3 IMPLANT
GOWN STRL REUS W/TWL XL LVL3 (GOWN DISPOSABLE) ×3 IMPLANT
IMMOBILIZER KNEE 20 (SOFTGOODS) ×3
IMMOBILIZER KNEE 20 THIGH 36 (SOFTGOODS) ×1 IMPLANT
KIT BASIN OR (CUSTOM PROCEDURE TRAY) ×3 IMPLANT
MANIFOLD NEPTUNE II (INSTRUMENTS) ×3 IMPLANT
NEEDLE HYPO 22GX1.5 SAFETY (NEEDLE) ×3 IMPLANT
PACK TOTAL JOINT (CUSTOM PROCEDURE TRAY) ×3 IMPLANT
POSITIONER SURGICAL ARM (MISCELLANEOUS) ×3 IMPLANT
SPONGE GAUZE 2X2 STER 10/PKG (GAUZE/BANDAGES/DRESSINGS)
SPONGE LAP 18X18 X RAY DECT (DISPOSABLE) IMPLANT
SPONGE LAP 4X18 X RAY DECT (DISPOSABLE) ×3 IMPLANT
SPONGE SURGIFOAM ABS GEL 100 (HEMOSTASIS) ×3 IMPLANT
STAPLER VISISTAT 35W (STAPLE) IMPLANT
SUCTION FRAZIER TIP 10 FR DISP (SUCTIONS) ×3 IMPLANT
SUT MNCRL AB 4-0 PS2 18 (SUTURE) IMPLANT
SUT VIC AB 0 CT1 27 (SUTURE)
SUT VIC AB 0 CT1 27XBRD ANTBC (SUTURE) IMPLANT
SUT VIC AB 1 CT1 27 (SUTURE) ×8
SUT VIC AB 1 CT1 27XBRD ANTBC (SUTURE) ×4 IMPLANT
SUT VIC AB 2-0 CT1 27 (SUTURE) ×6
SUT VIC AB 2-0 CT1 TAPERPNT 27 (SUTURE) ×3 IMPLANT
SUT VLOC 180 0 24IN GS25 (SUTURE) ×3 IMPLANT
SYR 20CC LL (SYRINGE) ×3 IMPLANT
TOWEL OR 17X26 10 PK STRL BLUE (TOWEL DISPOSABLE) ×3 IMPLANT
TRAY FOLEY CATH 14FRSI W/METER (CATHETERS) ×3 IMPLANT
WATER STERILE IRR 1500ML POUR (IV SOLUTION) ×3 IMPLANT
YANKAUER SUCT BULB TIP NO VENT (SUCTIONS) ×3 IMPLANT

## 2013-10-27 NOTE — Interval H&P Note (Signed)
History and Physical Interval Note:  10/27/2013 10:50 AM  [name]  has presented today for surgery, with the diagnosis of OA LEFT HIP  The various methods of treatment have been discussed with the patient and family. After consideration of risks, benefits and other options for treatment, the patient has consented to  Procedure(s): LEFT TOTAL HIP ARTHROPLASTY (Left) as a surgical intervention .  The patient's history has been reviewed, patient examined, no change in status, stable for surgery.  I have reviewed the patient's chart and labs.  Questions were answered to the patient's satisfaction.     [name]

## 2013-10-27 NOTE — Op Note (Signed)
NAMESANGEETA, YOUSE NO.:  [id number]  MEDICAL RECORD NO.:  [id number]  LOCATION:  9                         FACILITY:  [facility]  PHYSICIAN:  Kipp Brood. Tarrin Lebow, M.D.DATE OF BIRTH:  16-Jul-1940  DATE OF PROCEDURE:  10/27/2013 DATE OF DISCHARGE:                              OPERATIVE REPORT   SURGEON:  Kipp Brood. Gladstone Lighter, M.D.  ASSISTANT:  Tarri Glenn, M.D. and [place], [place].  PREOPERATIVE DIAGNOSES: 1. Protrusio acetabuli. 2. Severe flexion contracture, left hip. 3. Severe osteoarthritis, left hip with bone on bone.  POSTOPERATIVE DIAGNOSES: 1. Protrusio acetabuli. 2. Severe flexion contracture, left hip. 3. Severe osteoarthritis, left hip with bone on bone.  OPERATION: 1. Release of contractures, left hip. 2. Left DePuy non-cemented total hip arthroplasty utilizing a size 6     standard offset Tri-Lock stem.  We utilized a size 36 mm diameter     ceramic head with a +1 neck.  The Pinnacle cup was a size 50 with 1     screw utilized for fixation and the hole eliminator.  The insert     was a polyethylene liner insert, a size 32 mm diameter and the     diameter of the ceramic head was also 32 mm diameter.  DESCRIPTION OF PROCEDURE:  Under general anesthesia, routine orthopedic prep and draping of the left hip was carried out with the patient on the right side, left hip up.  She had 2 g of IV Ancef.  Appropriate time-out was first carried out.  I also marked the appropriate left hip in the holding area.  At this time, a posterolateral approach to the hip was carried out in the usual fashion.  Bleeders were identified and cauterized.  I then incised the iliotibial band and retractors were then advanced.  Following this, I went down and partially detached the external rotators.  Note, we did a capsulectomy though we could not dislocate the head because of the protrusio and the overgrowth of bone, so I amputated the femoral neck with the head in  situ and then once this was done, I removed that and then did some soft tissue releases and finally brought the hip around to the normal position at 90 degrees. Following that, we then utilized the box osteotome followed by the widening reamer followed by the canal finer.  Once we established the canal, I thoroughly irrigated out the canal and I broached the canal up to a size 6.  Initially we had it up to a 7, but then I had to cut the neck down further because of the severe deformity present.  Once we established the appropriate size broach, we then thoroughly irrigated out the canal and packed the canal with a large sponge, which was later removed.  Following that, attention was directed to the acetabulum.  We then completed a capsulectomy, there was a marked amount of overgrowth of bone and scar tissue around the hip.  We released all that, utilized osteotomes to even out the acetabulum.  At this particular point, we then began reaming the acetabulum, we reamed up to a size 49.  We had good bone bleeding.  We then inserted a  50 mm cup.  We had excellent fixation with one 25 mm screw, the hole eliminator also was inserted. We did check cup position first with the [place] guide.  We then inserted our polyethylene liner and had a great fixation with that.  We made sure all surrounding exostoses were removed with the osteotome to avoid any impingement.  We then removed the packing from the femoral canal and inserted our permanent standard offset, Tri-Lock stem size 6. We had excellent fixation.  I then inserted one through the trials again and finally selected a +1 neck and a 32 mm diameter ceramic ball.  We cleared the acetabulum, reduced the hip.  We had excellent function, excellent leg length, and excellent stability.  I thoroughly irrigated out the area and reapproximated the wound in usual fashion after injecting 20 mL of 0.5% Marcaine and a mixture of 20 mL of Exparel and 20 mL of  normal saline.  Sterile dressings were applied.          ______________________________ Kipp Brood Gladstone Lighter, M.D.     RAG/MEDQ  D:  10/27/2013  T:  10/27/2013  Job:  [id number]

## 2013-10-27 NOTE — Brief Op Note (Signed)
10/27/2013  12:42 PM  PATIENT:  [name]  73 y.o. female  PRE-OPERATIVE DIAGNOSIS:  OA LEFT HIP with severe Contracture and Protrusio Acetabulae.  POST-OPERATIVE DIAGNOSIS: Same as Pre-Op  PROCEDURE:  Procedure(s): LEFT TOTAL HIP ARTHROPLASTY (Left) utilizing a Non-Cemented Standard Off Set Triloc Stem  ,size 6 and a 21mm Pinacle Cup with one Screw and a Polyethlyene Cup inside Diameter of 52mm.The Head was a +1neck length,Ceramic head,size 48mm diameter.   SURGEON:  Surgeon(s) and Role:    * Tobi Bastos, MD - Primary    * Magnus Sinning, MD - Assisting  PHYSICIAN ASSISTANT: Ardeen Jourdain PA  ASSISTANTS: Tarri Glenn MD and Ardeen Jourdain PA  ANESTHESIA:   general  EBL:  Total I/O In: 2000 [I.V.:2000] Out: 600 [Urine:100; Blood:500]  BLOOD ADMINISTERED:none  DRAINS: none   LOCAL MEDICATIONS USED:  MARCAINE 20cc and20cc of Exparel mixed with 20cc of Normal Saline,     SPECIMEN:  No Specimen  DISPOSITION OF SPECIMEN:  N/A  COUNTS:  YES  TOURNIQUET:  * No tourniquets in log *  DICTATION: .Other Dictation: Dictation Number [id number]  PLAN OF CARE: Admit to inpatient   PATIENT DISPOSITION:  Stable in OR   Delay start of Pharmacological VTE agent (>24hrs) due to surgical blood loss or risk of bleeding: yes

## 2013-10-27 NOTE — Progress Notes (Signed)
INCORRECT DOCUMENTATION/PT not  In PACU YET.  ERROR in charting.Marland Kitchen  Zerita Boers  RN

## 2013-10-27 NOTE — Anesthesia Postprocedure Evaluation (Signed)
Anesthesia Post Note  Patient: [name]  Procedure(s) Performed: Procedure(s) (LRB): LEFT TOTAL HIP ARTHROPLASTY (Left)  Anesthesia type: General  Patient location: PACU  Post pain: Pain level controlled  Post assessment: Post-op Vital signs reviewed  Last Vitals: BP 131/72  Pulse 72  Temp(Src) 36.6 C (Oral)  Resp 10  Ht 5\' 4"  (1.626 m)  Wt 205 lb 3 oz (93.072 kg)  BMI 35.20 kg/m2  SpO2 100%  Post vital signs: Reviewed  Level of consciousness: sedated  Complications: No apparent anesthesia complications

## 2013-10-27 NOTE — Progress Notes (Signed)
X-ray results noted 

## 2013-10-27 NOTE — Transfer of Care (Signed)
Immediate Anesthesia Transfer of Care Note  Patient: [name]  Procedure(s) Performed: Procedure(s): LEFT TOTAL HIP ARTHROPLASTY (Left)  Patient Location: PACU  Anesthesia Type:General  Level of Consciousness: sedated  Airway & Oxygen Therapy: Patient Spontanous Breathing and Patient connected to face mask oxygen  Post-op Assessment: Report given to PACU RN and Post -op Vital signs reviewed and stable  Post vital signs: Reviewed and stable  Complications: No apparent anesthesia complications

## 2013-10-27 NOTE — Progress Notes (Signed)
Utilization review completed.  

## 2013-10-27 NOTE — Anesthesia Preprocedure Evaluation (Addendum)
Anesthesia Evaluation  Patient identified by MRN, date of birth, ID band Patient awake    Reviewed: Allergy & Precautions, H&P , NPO status , Patient's Chart, lab work & pertinent test results  History of Anesthesia Complications (+) PONV and history of anesthetic complications  Airway Mallampati: II TM Distance: >3 FB Neck ROM: Full    Dental no notable dental hx.    Pulmonary neg pulmonary ROS,  breath sounds clear to auscultation  Pulmonary exam normal       Cardiovascular hypertension, Pt. on medications + Valvular Problems/Murmurs AS Rhythm:Regular Rate:Normal  Echo 2013 - Left ventricle: The cavity size was normal. Wall thickness was increased in a pattern of mild LVH. Systolic function was normal. The estimated ejection fraction was in the range of 55% to 60%. Doppler parameters are consistent with abnormal left ventricular relaxation (grade 1 diastolic dysfunction). - Aortic valve: AV is thickened, calcified with mildly   restricted motion. Peak and mean gradients through the   valve are 37 and 19 mm Hg respectively consistent with   mild AS,.   Neuro/Psych negative neurological ROS  negative psych ROS   GI/Hepatic negative GI ROS, Neg liver ROS,   Endo/Other  negative endocrine ROS  Renal/GU negative Renal ROS     Musculoskeletal  (+) Arthritis -,   Abdominal (+) + obese,   Peds  Hematology negative hematology ROS (+)   Anesthesia Other Findings   Reproductive/Obstetrics negative OB ROS                          Anesthesia Physical Anesthesia Plan  ASA: III  Anesthesia Plan: General   Post-op Pain Management:    Induction: Intravenous  Airway Management Planned:   Additional Equipment:   Intra-op Plan:   Post-operative Plan: Extubation in OR  Informed Consent: I have reviewed the patients History and Physical, chart, labs and discussed the procedure including the  risks, benefits and alternatives for the proposed anesthesia with the patient or authorized representative who has indicated his/her understanding and acceptance.   Dental advisory given  Plan Discussed with: CRNA  Anesthesia Plan Comments:         Anesthesia Quick Evaluation

## 2013-10-27 NOTE — Anesthesia Procedure Notes (Signed)
Procedure Name: Intubation Date/Time: 10/27/2013 11:00 AM Performed by: Danley Danker L Patient Re-evaluated:Patient Re-evaluated prior to inductionOxygen Delivery Method: Circle system utilized Preoxygenation: Pre-oxygenation with 100% oxygen Intubation Type: IV induction Ventilation: Mask ventilation without difficulty and Oral airway inserted - appropriate to patient size Laryngoscope Size: Sabra Heck and 2 Grade View: Grade III Tube type: Oral Tube size: 7.0 mm Number of attempts: 1 Airway Equipment and Method: Bougie stylet Placement Confirmation: ETT inserted through vocal cords under direct vision,  breath sounds checked- equal and bilateral and positive ETCO2 Secured at: 21 cm Tube secured with: Tape Dental Injury: Teeth and Oropharynx as per pre-operative assessment  Difficulty Due To: Difficulty was anticipated, Difficult Airway- due to large tongue, Difficult Airway- due to anterior larynx and Difficult Airway- due to reduced neck mobility Future Recommendations: Recommend- induction with short-acting agent, and alternative techniques readily available

## 2013-10-28 ENCOUNTER — Encounter (HOSPITAL_COMMUNITY): Payer: Self-pay | Admitting: Orthopedic Surgery

## 2013-10-28 LAB — CBC
HCT: 29.1 % — ABNORMAL LOW (ref 36.0–46.0)
HEMOGLOBIN: 9.7 g/dL — AB (ref 12.0–15.0)
MCH: 28.5 pg (ref 26.0–34.0)
MCHC: 33.3 g/dL (ref 30.0–36.0)
MCV: 85.6 fL (ref 78.0–100.0)
Platelets: 152 10*3/uL (ref 150–400)
RBC: 3.4 MIL/uL — AB (ref 3.87–5.11)
RDW: 12.5 % (ref 11.5–15.5)
WBC: 9.9 10*3/uL (ref 4.0–10.5)

## 2013-10-28 LAB — GLUCOSE, CAPILLARY
GLUCOSE-CAPILLARY: 107 mg/dL — AB (ref 70–99)
GLUCOSE-CAPILLARY: 143 mg/dL — AB (ref 70–99)
Glucose-Capillary: 100 mg/dL — ABNORMAL HIGH (ref 70–99)
Glucose-Capillary: 110 mg/dL — ABNORMAL HIGH (ref 70–99)

## 2013-10-28 LAB — BASIC METABOLIC PANEL
Anion gap: 10 (ref 5–15)
BUN: 13 mg/dL (ref 6–23)
CO2: 26 mEq/L (ref 19–32)
Calcium: 8.5 mg/dL (ref 8.4–10.5)
Chloride: 104 mEq/L (ref 96–112)
Creatinine, Ser: 0.67 mg/dL (ref 0.50–1.10)
GFR calc Af Amer: >90 mL/min (ref >90)
GFR, EST NON AFRICAN AMERICAN: 84 mL/min — AB (ref >90)
Glucose, Bld: 122 mg/dL — ABNORMAL HIGH (ref 70–99)
POTASSIUM: 4.1 meq/L (ref 3.7–5.3)
SODIUM: 140 meq/L (ref 137–147)

## 2013-10-28 NOTE — Discharge Instructions (Addendum)
Walk with your walker. Partial weightbearing left LE for one week. Then progress to WBAT. Do not change the dressing unless there is excess drainage Shower only, no tub bath. Do not take vitamins, supplements, or NSAIDs while on Xarelto (except for potassium and iron).  Call if any temperatures greater than 101 or any wound complications: [phone] during the day and ask for Dr. Charlestine Night nurse, Brunilda Payor.  Information on my medicine - XARELTO (Rivaroxaban)  This medication education was reviewed with me or my healthcare representative as part of my discharge preparation.  The pharmacist that spoke with me during my hospital stay was:  Julio Sicks, [facility]  Why was Xarelto prescribed for you? Xarelto was prescribed for you to reduce the risk of blood clots forming after orthopedic surgery. The medical term for these abnormal blood clots is venous thromboembolism (VTE).  What do you need to know about xarelto ? Take your Xarelto ONCE DAILY at the same time every day. You may take it either with or without food.  If you have difficulty swallowing the tablet whole, you may crush it and mix in applesauce just prior to taking your dose.  Take Xarelto exactly as prescribed by your doctor and DO NOT stop taking Xarelto without talking to the doctor who prescribed the medication.  Stopping without other VTE prevention medication to take the place of Xarelto may increase your risk of developing a clot.  After discharge, you should have regular check-up appointments with your healthcare provider that is prescribing your Xarelto.    What do you do if you miss a dose? If you miss a dose, take it as soon as you remember on the same day then continue your regularly scheduled once daily regimen the next day. Do not take two doses of Xarelto on the same day.   Important Safety Information A possible side effect of Xarelto is bleeding. You should call your healthcare provider right away if  you experience any of the following:   Bleeding from an injury or your nose that does not stop.   Unusual colored urine (red or dark brown) or unusual colored stools (red or black).   Unusual bruising for unknown reasons.   A serious fall or if you hit your head (even if there is no bleeding).  Some medicines may interact with Xarelto and might increase your risk of bleeding while on Xarelto. To help avoid this, consult your healthcare provider or pharmacist prior to using any new prescription or non-prescription medications, including herbals, vitamins, non-steroidal anti-inflammatory drugs (NSAIDs) and supplements.  This website has more information on Xarelto: [email or web address]

## 2013-10-28 NOTE — Evaluation (Signed)
Physical Therapy Evaluation Patient Details Name: [name] MRN: [id number] DOB: 1941-01-10 Today's Date: 10/28/2013   History of Present Illness     Clinical Impression  Pt s/p L THR presents with decreased L LE strength/ROM, post op pain, limited L knee flex and posterior THP limiting functional mobility.  Pt should progress to d/c home with family assist and HHPT follow up.  Follow Up Recommendations Home health PT    Equipment Recommendations  Rolling walker with 5" wheels    Recommendations for Other Services OT consult     Precautions / Restrictions Precautions Precautions: Posterior Hip;Fall Precaution Booklet Issued: Yes (comment) Precaution Comments: sign hung on wall Restrictions Weight Bearing Restrictions: No Other Position/Activity Restrictions: WBAT      Mobility  Bed Mobility Overal bed mobility: Needs Assistance Bed Mobility: Supine to Sit     Supine to sit: Mod assist     General bed mobility comments: cues for sequence, use of L LE to self assist and adherence to post THP  Transfers Overall transfer level: Needs assistance Equipment used: Rolling walker (2 wheeled) Transfers: Sit to/from Stand Sit to Stand: Mod assist         General transfer comment: cues for LE management and use of UEs to self assist  Ambulation/Gait Ambulation/Gait assistance: Min assist;Mod assist Ambulation Distance (Feet): 95 Feet Assistive device: Rolling walker (2 wheeled) Gait Pattern/deviations: Step-to pattern;Step-through pattern;Shuffle;Trunk flexed     General Gait Details: cues for posture, sequence, position from RW and ER on L  Stairs            Wheelchair Mobility    Modified Rankin (Stroke Patients Only)       Balance                                             Pertinent Vitals/Pain Pain Assessment: 0-10 Pain Score: 0-No pain Pain Location: L hip Pain Descriptors / Indicators: Aching Pain Intervention(s):  Limited activity within patient's tolerance;Monitored during session;Premedicated before session;Ice applied    Home Living Family/patient expects to be discharged to:: Private residence Living Arrangements: Spouse/significant other Available Help at Discharge: Family Type of Home: House Home Access: Stairs to enter Entrance Stairs-Rails: None Technical brewer of Steps: 3 Home Layout: One level Home Equipment: None      Prior Function Level of Independence: Independent               Hand Dominance   Dominant Hand: Right    Extremity/Trunk Assessment   Upper Extremity Assessment: Overall WFL for tasks assessed           Lower Extremity Assessment: LLE deficits/detail;RLE deficits/detail RLE Deficits / Details: knee flex ltd to ~70 in sitting LLE Deficits / Details: 2/5 hip strength with AAROM at hip to 85 flex and 15 abd     Communication   Communication: No difficulties  Cognition Arousal/Alertness: Awake/alert Behavior During Therapy: WFL for tasks assessed/performed Overall Cognitive Status: Within Functional Limits for tasks assessed                      General Comments      Exercises Total Joint Exercises Ankle Circles/Pumps: AROM;Both;15 reps;Supine Quad Sets: AROM;Left;10 reps;Supine Heel Slides: AAROM;Left;15 reps;Supine Hip ABduction/ADduction: AAROM;Left;15 reps;Supine      Assessment/Plan    PT Assessment Patient needs continued PT services  PT Diagnosis  Difficulty walking   PT Problem List Decreased strength;Decreased range of motion;Decreased activity tolerance;Decreased mobility;Decreased knowledge of use of DME;Obesity;Pain;Decreased knowledge of precautions  PT Treatment Interventions DME instruction;Gait training;Stair training;Functional mobility training;Therapeutic activities;Therapeutic exercise;Patient/family education   PT Goals (Current goals can be found in the Care Plan section) Acute Rehab PT Goals Patient  Stated Goal: Resume previous lifestyle with decreased pain PT Goal Formulation: With patient Time For Goal Achievement: 11/04/13 Potential to Achieve Goals: Good    Frequency 7X/week   Barriers to discharge        Co-evaluation               End of Session Equipment Utilized During Treatment: Gait belt Activity Tolerance: Patient tolerated treatment well Patient left: in chair;with call bell/phone within reach Nurse Communication: Mobility status         Time: 1025-1058 PT Time Calculation (min): 33 min   Charges:   PT Evaluation $Initial PT Evaluation Tier I: 1 Procedure PT Treatments $Gait Training: 8-22 mins $Therapeutic Exercise: 8-22 mins   PT G Codes:          [name] 10/28/2013, 12:57 PM

## 2013-10-28 NOTE — Progress Notes (Signed)
Physical Therapy Treatment Patient Details Name: [name] MRN: [id number] DOB: Feb 24, 1940 Today's Date: 10/28/2013    History of Present Illness s/p L posterior THA    PT Comments      Follow Up Recommendations  Home health PT     Equipment Recommendations  Rolling walker with 5" wheels    Recommendations for Other Services OT consult     Precautions / Restrictions Precautions Precautions: Posterior Hip;Fall Precaution Booklet Issued: Yes (comment) Precaution Comments: Pt recalls 2/3 THP without cues Restrictions Weight Bearing Restrictions: No Other Position/Activity Restrictions: WBAT    Mobility  Bed Mobility Overal bed mobility: Needs Assistance Bed Mobility: Supine to Sit;Sit to Supine     Supine to sit: Min assist;Mod assist Sit to supine: Min assist;Mod assist   General bed mobility comments: cues for sequence, use of R LE to self assist and adherence to THP  Transfers Overall transfer level: Needs assistance Equipment used: Rolling walker (2 wheeled) Transfers: Sit to/from Stand Sit to Stand: Min assist         General transfer comment: cues for UE/LE placement  Ambulation/Gait Ambulation/Gait assistance: Min assist;Mod assist Ambulation Distance (Feet): 100 Feet Assistive device: Rolling walker (2 wheeled) Gait Pattern/deviations: Step-to pattern;Step-through pattern;Decreased step length - right;Decreased step length - left;Shuffle;Trunk flexed Gait velocity: decreased with multiple rests to complete task   General Gait Details: cues for posture, sequence, position from RW and ER on L   Stairs            Wheelchair Mobility    Modified Rankin (Stroke Patients Only)       Balance                                    Cognition Arousal/Alertness: Awake/alert Behavior During Therapy: WFL for tasks assessed/performed Overall Cognitive Status: Within Functional Limits for tasks assessed                       Exercises      General Comments        Pertinent Vitals/Pain Pain Assessment: 0-10 Pain Score: 4  Pain Location: L hip Pain Descriptors / Indicators: Aching Pain Intervention(s): Limited activity within patient's tolerance;Monitored during session;Premedicated before session;Ice applied    Home Living Family/patient expects to be discharged to:: Private residence Living Arrangements: Spouse/significant other Available Help at Discharge: Family Type of Home: House Home Access: Stairs to enter Entrance Stairs-Rails: None Home Layout: One level Home Equipment: Bedside commode Additional Comments: Husband uses a RW.  Son lives behind her:  has his own business    Prior Function Level of Independence: Independent          PT Goals (current goals can now be found in the care plan section) Acute Rehab PT Goals Patient Stated Goal: Resume previous lifestyle with decreased pain PT Goal Formulation: With patient Time For Goal Achievement: 11/04/13 Potential to Achieve Goals: Good Progress towards PT goals: Progressing toward goals    Frequency  7X/week    PT Plan Current plan remains appropriate    Co-evaluation             End of Session Equipment Utilized During Treatment: Gait belt Activity Tolerance: Patient tolerated treatment well;Patient limited by fatigue Patient left: in bed;with call bell/phone within reach;with nursing/sitter in room     Time: 0375-4360 PT Time Calculation (min): 28 min  Charges:  $Gait Training:  23-37 mins                    G Codes:      Chequita Mofield November 02, 2013, 4:52 PM

## 2013-10-28 NOTE — Progress Notes (Signed)
CSW consulted for SNF placement. Met with pt this am to assist with d/c planning. Pt will only consider rehab in [place] . [facility] and [facility] contacted. Neither facility is able to assist. Pt will not consider other bed offers in [facility].Marland Kitchen RNCM is assisting with d/c planning to home.  CSW is available to assist with placement if pt changes her mind.  Werner Lean LCSW [phone]

## 2013-10-28 NOTE — Care Management Note (Signed)
    Page 1 of 2   10/28/2013     11:52:50 AM CARE MANAGEMENT NOTE 10/28/2013  Patient:  [name], [name]   Account Number:  [id number]  Date Initiated:  10/28/2013  Documentation initiated by:  [facility]  Subjective/Objective Assessment:   adm: total hip arthroplasty(Left)     Action/Plan:   discharge planning   Anticipated DC Date:  10/29/2013   Anticipated DC Plan:  [place]  CM consult      [facility] Choice  HOME HEALTH   Choice offered to / List presented to:  C-1 Patient   DME arranged  Vassie Moselle      DME agency  [place] arranged  [place]   Status of service:  Completed, signed off Medicare Important Message given?   (If response is "NO", the following Medicare IM given date fields will be blank) Date Medicare IM given:   Medicare IM given by:   Date Additional Medicare IM given:   Additional Medicare IM given by:    Discharge Disposition:  [place]  Per UR Regulation:    If discussed at Long Length of Stay Meetings, dates discussed:    Comments:  10/28/13 11:35 CM was notified SNF of choice not available and pt wishes to go home with HHPT.  CM met with pt in room to offer choice of home health [facility].  Pt chooses [facility] to render HHPT.  Pt states she would like a rolling walker but not if it is not covered by her insurance.  DME rep, Jeneen Rinks is researching coverage.  Address and contact information verified with pt.  Referral called to Sha[facility].  No other CM needs were communicated.  Mariane Masters, BSN, CM [phone].

## 2013-10-28 NOTE — Evaluation (Signed)
Occupational Therapy Evaluation Patient Details Name: [name] MRN: [id number] DOB: 1940-03-13 Today's Date: 10/28/2013    History of Present Illness s/p L posterior THA   Clinical Impression   This 73 year old female was admitted for the above surgery.  She was mod I with adls prior to admission, and she currently needs min to mod A for adls.  Will follow in acute with overall min guard to supervision level goals.      Follow Up Recommendations  Home health OT (pt does not want SNF--would benefit)    Equipment Recommendations  3 in 1 bedside comode    Recommendations for Other Services       Precautions / Restrictions Precautions Precautions: Posterior Hip;Fall Precaution Booklet Issued: Yes (comment) Precaution Comments: sign hung on wall Restrictions Weight Bearing Restrictions: No Other Position/Activity Restrictions: WBAT      Mobility Bed Mobility Overal bed mobility: Needs Assistance Bed Mobility: Supine to Sit     Supine to sit: Mod assist Sit to supine: Mod assist   General bed mobility comments: assist for LLE, assist to scoot over  and cues for technique  Transfers Overall transfer level: Needs assistance Equipment used: Rolling walker (2 wheeled) Transfers: Sit to/from Stand Sit to Stand: Min assist         General transfer comment: cues for UE/LE placement    Balance                                            ADL Overall ADL's : Needs assistance/impaired     Grooming: Set up;Sitting   Upper Body Bathing: Set up;Sitting   Lower Body Bathing: Minimal assistance;Sit to/from stand;With adaptive equipment   Upper Body Dressing : Set up;Sitting   Lower Body Dressing: Moderate assistance;Sit to/from stand;With adaptive equipment   Toilet Transfer: Minimal assistance;BSC;Stand-pivot   Toileting- Clothing Manipulation and Hygiene: Minimal assistance;Sit to/from stand         General ADL Comments: reviewed AE  with pt.  She has a Secondary school teacher at home but doesn't know if it will come to a point to hold pants.  She has been using a backscratcher:  she is not sure about the length of it--she may have bent forward.  Reviewed THPs.  She used to have a sock aide, but she never liked it.  Will use backscratcher or have husband assist her.  Pt wanted to get back to bed after practicing transfer to 3:1     Vision                     Perception     Praxis      Pertinent Vitals/Pain Pain Assessment: 0-10 Pain Score: 5/10 Pain Location: L hip Pain Descriptors / Indicators: Aching Pain Intervention(s): Limited activity within patient's tolerance;Monitored during session;Premedicated before session;Ice applied     Hand Dominance Right   Extremity/Trunk Assessment Upper Extremity Assessment Upper Extremity Assessment: Overall WFL for tasks assessed   Lower Extremity Assessment Lower Extremity Assessment: LLE deficits/detail;RLE deficits/detail RLE Deficits / Details: knee flex ltd to ~70 in sitting LLE Deficits / Details: 2/5 hip strength with AAROM at hip to 85 flex and 15 abd       Communication Communication Communication: No difficulties   Cognition Arousal/Alertness: Awake/alert Behavior During Therapy: WFL for tasks assessed/performed Overall Cognitive Status: Within Functional Limits for tasks assessed  General Comments       Exercises      Shoulder Instructions      Home Living Family/patient expects to be discharged to:: Private residence Living Arrangements: Spouse/significant other Available Help at Discharge: Family Type of Home: House Home Access: Stairs to enter Technical brewer of Steps: 3 Entrance Stairs-Rails: None Home Layout: One level     Bathroom Shower/Tub: Occupational psychologist: Handicapped height     Home Equipment: Bedside commode   Additional Comments: Husband uses a RW.  Son lives behind her:  has  his own business      Prior Functioning/Environment Level of Independence: Independent             OT Diagnosis: Generalized weakness   OT Problem List: Decreased strength;Decreased activity tolerance;Decreased knowledge of use of DME or AE;Decreased knowledge of precautions;Pain   OT Treatment/Interventions: Self-care/ADL training;DME and/or AE instruction;Patient/family education    OT Goals(Current goals can be found in the care plan section) Acute Rehab OT Goals Patient Stated Goal: Resume previous lifestyle with decreased pain OT Goal Formulation: With patient Time For Goal Achievement: 11/04/13 Potential to Achieve Goals: Good ADL Goals Pt Will Perform Lower Body Bathing: with adaptive equipment;sit to/from stand;with supervision Pt Will Perform Lower Body Dressing: with adaptive equipment;sit to/from stand;with supervision (except for socks) Pt Will Transfer to Toilet: with min guard assist;ambulating;bedside commode Pt Will Perform Toileting - Clothing Manipulation and hygiene: sit to/from stand;with supervision Pt Will Perform Tub/Shower Transfer: with min guard assist;Shower transfer;ambulating;3 in 1 Additional ADL Goal #1: pt will not need cues for posterior thps during adls/bathroom transfers  OT Frequency: Min 2X/week   Barriers to D/C:            Co-evaluation              End of Session    Activity Tolerance: Patient tolerated treatment well Patient left: in bed;with call bell/phone within reach   Time: 1257-1332 OT Time Calculation (min): 35 min Charges:  OT General Charges $OT Visit: 1 Procedure OT Evaluation $Initial OT Evaluation Tier I: 1 Procedure OT Treatments $Self Care/Home Management : 23-37 mins G-Codes:    Lillith Mcneff 11/25/2013, 1:44 PM  Lesle Chris, OTR/L [phone] 25-Nov-2013

## 2013-10-29 LAB — BASIC METABOLIC PANEL
Anion gap: 10 (ref 5–15)
BUN: 12 mg/dL (ref 6–23)
CHLORIDE: 100 meq/L (ref 96–112)
CO2: 27 mEq/L (ref 19–32)
Calcium: 8.5 mg/dL (ref 8.4–10.5)
Creatinine, Ser: 0.62 mg/dL (ref 0.50–1.10)
GFR, EST NON AFRICAN AMERICAN: 87 mL/min — AB (ref >90)
GLUCOSE: 129 mg/dL — AB (ref 70–99)
Potassium: 3.6 mEq/L — ABNORMAL LOW (ref 3.7–5.3)
Sodium: 137 mEq/L (ref 137–147)

## 2013-10-29 LAB — GLUCOSE, CAPILLARY
Glucose-Capillary: 130 mg/dL — ABNORMAL HIGH (ref 70–99)
Glucose-Capillary: 113 mg/dL — ABNORMAL HIGH (ref 70–99)

## 2013-10-29 LAB — CBC
HEMATOCRIT: 27.5 % — AB (ref 36.0–46.0)
HEMOGLOBIN: 9.3 g/dL — AB (ref 12.0–15.0)
MCH: 29.3 pg (ref 26.0–34.0)
MCHC: 33.8 g/dL (ref 30.0–36.0)
MCV: 86.8 fL (ref 78.0–100.0)
Platelets: 142 10*3/uL — ABNORMAL LOW (ref 150–400)
RBC: 3.17 MIL/uL — ABNORMAL LOW (ref 3.87–5.11)
RDW: 12.7 % (ref 11.5–15.5)
WBC: 9.1 10*3/uL (ref 4.0–10.5)

## 2013-10-29 MED ORDER — RIVAROXABAN 10 MG PO TABS: 10.0000 mg | ORAL_TABLET | Freq: Every day | ORAL | Status: DC

## 2013-10-29 MED ORDER — OXYCODONE HCL 5 MG PO TABS: 5.0000 mg | ORAL_TABLET | ORAL | Status: DC | PRN

## 2013-10-29 MED ORDER — METHOCARBAMOL 500 MG PO TABS: 500.0000 mg | ORAL_TABLET | Freq: Four times a day (QID) | ORAL | Status: DC | PRN

## 2013-10-29 MED ORDER — BISACODYL 5 MG PO TBEC: 5.0000 mg | DELAYED_RELEASE_TABLET | Freq: Every day | ORAL | Status: DC | PRN

## 2013-10-29 MED ORDER — FERROUS SULFATE 325 (65 FE) MG PO TABS: 325.0000 mg | ORAL_TABLET | Freq: Three times a day (TID) | ORAL | Status: DC

## 2013-10-29 NOTE — Progress Notes (Signed)
Physical Therapy Treatment Patient Details Name: ADDALYNNE GOLDING MRN: [id number] DOB: [birth year] Today's Date: 10/29/2013    History of Present Illness s/p L posterior THA    PT Comments    Reviewed stairs and car transfers with pt.    Follow Up Recommendations  Home health PT     Equipment Recommendations  Rolling walker with 5" wheels    Recommendations for Other Services OT consult     Precautions / Restrictions Precautions Precautions: Posterior Hip;Fall Precaution Booklet Issued: Yes (comment) Precaution Comments: Pt recalls all THP without cues Restrictions Weight Bearing Restrictions: No Other Position/Activity Restrictions: WBAT    Mobility  Bed Mobility Overal bed mobility: Needs Assistance Bed Mobility: Sit to Supine     Supine to sit: Min assist Sit to supine: Min assist   General bed mobility comments: min assist with L LE and cues for sequence and adherence to THP  Transfers Overall transfer level: Needs assistance Equipment used: Rolling walker (2 wheeled) Transfers: Sit to/from Stand Sit to Stand: Min assist         General transfer comment: cues for UE/LE placement.  Pt strugglingh 2* ltd flexion at R knee  Ambulation/Gait Ambulation/Gait assistance: Min assist;Min guard Ambulation Distance (Feet): 60 Feet Assistive device: Rolling walker (2 wheeled) Gait Pattern/deviations: Step-to pattern;Decreased step length - right;Decreased step length - left;Shuffle;Antalgic;Trunk flexed Gait velocity: decr   General Gait Details: cues for posture, sequence, position from RW and ER on L   Stairs Stairs: Yes Stairs assistance: Min assist Stair Management: No rails;Step to pattern;Backwards;With walker Number of Stairs: 2 General stair comments: cues for sequence and foot/RW placement; pt struggling 2* ltd flexion R knee  Wheelchair Mobility    Modified Rankin (Stroke Patients Only)       Balance                                    Cognition Arousal/Alertness: Awake/alert Behavior During Therapy: WFL for tasks assessed/performed Overall Cognitive Status: Within Functional Limits for tasks assessed                      Exercises Total Joint Exercises Ankle Circles/Pumps: AROM;Both;15 reps;Supine Quad Sets: AROM;Left;10 reps;Supine Heel Slides: AAROM;Left;15 reps;Supine Hip ABduction/ADduction: AAROM;Left;Supine;20 reps    General Comments        Pertinent Vitals/Pain Pain Assessment: 0-10 Pain Score: 3  Pain Location: L hip Pain Descriptors / Indicators: Aching Pain Intervention(s): Limited activity within patient's tolerance;Monitored during session;Premedicated before session;Ice applied    Home Living                      Prior Function            PT Goals (current goals can now be found in the care plan section) Acute Rehab PT Goals Patient Stated Goal: Resume previous lifestyle with decreased pain PT Goal Formulation: With patient Time For Goal Achievement: 11/04/13 Potential to Achieve Goals: Good Progress towards PT goals: Progressing toward goals    Frequency  7X/week    PT Plan Current plan remains appropriate    Co-evaluation             End of Session Equipment Utilized During Treatment: Gait belt Activity Tolerance: Patient tolerated treatment well;Patient limited by fatigue Patient left: in bed;with call bell/phone within reach     Time: 1140-1210 PT Time Calculation (min): 30 min  Charges:  $Gait Training: 8-22 mins $Therapeutic Exercise: 8-22 mins                    G Codes:      Allizon Woznick Nov 11, 2013, 2:29 PM

## 2013-10-29 NOTE — Progress Notes (Signed)
Occupational Therapy Treatment Patient Details Name: JANECE LAIDLAW MRN: [id number] DOB: 1940/01/30 Today's Date: 10/29/2013    History of present illness s/p L posterior THA   OT comments  Pt fatiques easily and needs 25% assistance to get up from bed and light min A to stand.  She would benefit from SNF but she is refusing as she couldn't get place she wanted.  Slowly increasing activity with OT.  Recommend continued OT follow up when she leaves.  Follow Up Recommendations  Home health OT (pt would benefit from SNF but refusing)    Equipment Recommendations  3 in 1 bedside comode    Recommendations for Other Services      Precautions / Restrictions Precautions Precautions: Posterior Hip;Fall Precaution Booklet Issued: Yes (comment) Restrictions Other Position/Activity Restrictions: WBAT       Mobility Bed Mobility         Supine to sit: Min assist     General bed mobility comments: support for LLE then assist with trunk to come up from bed; cues for technique  Transfers     Transfers: Sit to/from Stand Sit to Stand: Min assist         General transfer comment: cues for UE/LE placement    Balance                                   ADL               Lower Body Bathing: Minimal assistance;Sit to/from stand;With adaptive equipment       Lower Body Dressing: Sit to/from stand;With adaptive equipment;Minimal assistance (pants only)   Toilet Transfer: Minimal assistance;Ambulation;BSC   Toileting- Clothing Manipulation and Hygiene: Minimal assistance;Sit to/from stand         General ADL Comments: pt ambulated to bathroom and performed adl from bedside commode.  Fatiques easily      [employer] information centre manager   Behavior During Therapy: WFL for tasks assessed/performed Overall Cognitive Status: Within Functional Limits for tasks assessed                        Extremity/Trunk Assessment               Exercises     Shoulder Instructions       General Comments      Pertinent Vitals/ Pain       Pain Score: 3  (up to 8 with weightbearing) Pain Location: L hip Pain Descriptors / Indicators: Aching Pain Intervention(s): Limited activity within patient's tolerance;Monitored during session;Premedicated before session;Repositioned  Home Living                                          Prior Functioning/Environment              Frequency Min 2X/week     Progress Toward Goals  OT Goals(current goals can now be found in the care plan section)  Progress towards OT goals: Progressing toward goals     Plan      Co-evaluation  End of Session     Activity Tolerance Patient limited by fatigue   Patient Left in chair;with call bell/phone within reach   Nurse Communication          Time: 1000-1034 OT Time Calculation (min): 34 min  Charges: OT General Charges $OT Visit: 1 Procedure OT Treatments $Self Care/Home Management : 23-37 mins  Sherrel Ploch 10/29/2013, 11:33 AM   Lesle Chris, OTR/L [phone] 10/29/2013

## 2013-10-29 NOTE — Discharge Summary (Signed)
Pt VSS. Alert and oriented. Pt tolerating pain well with minimal prescribed pain medication. Pt was educated on discharge instructions and given prescribed prescription to pick up from pharmacy. Pt Concerns were addressed.  Pt discharged with significant other in wheelchair.

## 2013-10-29 NOTE — Progress Notes (Signed)
   Subjective: 2 Days Post-Op Procedure(s) (LRB): LEFT TOTAL HIP ARTHROPLASTY (Left) Patient reports pain as mild.   Patient seen in rounds with Dr. Gladstone Lighter. Patient is well, and has had no acute complaints or problems. She reports that she felt comfortable walking yesterday except for some weakness in her arms from using the walker. No issues overnight. No SOB or chest pain.  Plan is to go Home after hospital stay.  Objective: Vital signs in last 24 hours: Temp:  [98.8 F (37.1 C)-100 F (37.8 C)] 100 F (37.8 C) (10/09 0554) Pulse Rate:  [80-109] 106 (10/09 0554) Resp:  [16] 16 (10/09 0554) BP: (108-109)/(59-62) 108/59 mmHg (10/09 0554) SpO2:  [97 %-98 %] 97 % (10/09 0554)  Intake/Output from previous day:  Intake/Output Summary (Last 24 hours) at 10/29/13 0745 Last data filed at 10/29/13 0554  Gross per 24 hour  Intake 2228.33 ml  Output   1650 ml  Net 578.33 ml     Labs:  Recent Labs  10/28/13 0500 10/29/13 0428  HGB 9.7* 9.3*    Recent Labs  10/28/13 0500 10/29/13 0428  WBC 9.9 9.1  RBC 3.40* 3.17*  HCT 29.1* 27.5*  PLT 152 142*    Recent Labs  10/28/13 0500 10/29/13 0428  NA 140 137  K 4.1 3.6*  CL 104 100  CO2 26 27  BUN 13 12  CREATININE 0.67 0.62  GLUCOSE 122* 129*  CALCIUM 8.5 8.5    EXAM General - Patient is Alert and Oriented Extremity - Neurologically intact Intact pulses distally Dorsiflexion/Plantar flexion intact Compartment soft Dressing/Incision - clean, dry, no drainage Motor Function - intact, moving foot and toes well on exam.   Past Medical History  Diagnosis Date  . IFG (impaired fasting glucose)   . Hypertension   . Aortic stenosis, mild   . Aortic valve stenosis, mild   . Vitamin D deficiency   . Complication of anesthesia   . PONV (postoperative nausea and vomiting)     years ago with back surgery-nausea  . Arthritis     Assessment/Plan: 2 Days Post-Op Procedure(s) (LRB): LEFT TOTAL HIP ARTHROPLASTY  (Left) Active Problems:   Osteoarthritis of left hip   S/P total hip arthroplasty  Estimated body mass index is 35.2 kg/(m^2) as calculated from the following:   Height as of this encounter: 5\' 4"  (1.626 m).   Weight as of this encounter: 93.072 kg (205 lb 3 oz). Advance diet Up with therapy D/C IV fluids Discharge home with home health  DVT Prophylaxis - Xarelto PWB 50% left LE  DC home today following two session of PT if she continues to progress. Follow up in office in 2 weeks.  Ardeen Jourdain, PA-C Orthopaedic Surgery 10/29/2013, 7:45 AM

## 2013-11-01 ENCOUNTER — Telehealth: Payer: Self-pay | Admitting: Nurse Practitioner

## 2013-11-01 NOTE — Telephone Encounter (Signed)
Patient was told to call and let [name] know how she was doing after her hip surgery. She said she is doing just fine. She did not get to go to the [facility] like she wanted to, but she is home and doing well.

## 2013-11-03 NOTE — Telephone Encounter (Signed)
Noted  

## 2013-11-07 NOTE — Discharge Summary (Signed)
Physician Discharge Summary   Patient ID: [name] MRN: [id number] DOB/AGE: 74/08/1940 74 y.o.  Admit date: 10/27/2013 Discharge date: 10/29/2013  Primary Diagnosis: Osteoarthritis, left hip   Admission Diagnoses:  Past Medical History  Diagnosis Date  . IFG (impaired fasting glucose)   . Hypertension   . Aortic stenosis, mild   . Aortic valve stenosis, mild   . Vitamin D deficiency   . Complication of anesthesia   . PONV (postoperative nausea and vomiting)     years ago with back surgery-nausea  . Arthritis    Discharge Diagnoses:   Active Problems:   Osteoarthritis of left hip   S/P total hip arthroplasty  Estimated body mass index is 35.2 kg/(m^2) as calculated from the following:   Height as of this encounter: $RemoveBeforeD'5\' 4"'[id number]$  (1.626 m).   Weight as of this encounter: 93.072 kg (205 lb 3 oz).  Procedure(s) (LRB): LEFT TOTAL HIP ARTHROPLASTY (Left)   Consults: None  HPI: [name], 73 y.o. female, has a history of pain and functional disability in the left hip(s) due to arthritis and patient has failed non-surgical conservative treatments for greater than 12 weeks to include NSAID's and/or analgesics, corticosteriod injections and activity modification. Onset of symptoms was gradual starting 2 years ago with stable course since that time.The patient noted no past surgery on the left hip(s). Patient currently rates pain in the left hip at 7 out of 10 with activity. Patient has night pain, worsening of pain with activity and weight bearing, pain that interfers with activities of daily living and pain with passive range of motion. Patient has evidence of subchondral cysts, periarticular osteophytes and joint space narrowing by imaging studies. This condition presents safety issues increasing the risk of falls. There is no current active infection.   Laboratory Data: Admission on 10/27/2013, Discharged on 10/29/2013  Component Date Value Ref Range Status  . ABO/RH(D)  10/27/2013 B POS   Final  . Antibody Screen 10/27/2013 NEG   Final  . Sample Expiration 10/27/2013 10/30/2013   Final  . Glucose-Capillary 10/27/2013 117* 70 - 99 mg/dL Final  . Comment 1 10/27/2013 Notify RN   Final  . Glucose-Capillary 10/27/2013 148* 70 - 99 mg/dL Final  . WBC 10/28/2013 9.9  4.0 - 10.5 K/uL Final  . RBC 10/28/2013 3.40* 3.87 - 5.11 MIL/uL Final  . Hemoglobin 10/28/2013 9.7* 12.0 - 15.0 g/dL Final  . HCT 10/28/2013 29.1* 36.0 - 46.0 % Final  . MCV 10/28/2013 85.6  78.0 - 100.0 fL Final  . MCH 10/28/2013 28.5  26.0 - 34.0 pg Final  . MCHC 10/28/2013 33.3  30.0 - 36.0 g/dL Final  . RDW 10/28/2013 12.5  11.5 - 15.5 % Final  . Platelets 10/28/2013 152  150 - 400 K/uL Final  . Sodium 10/28/2013 140  137 - 147 mEq/L Final  . Potassium 10/28/2013 4.1  3.7 - 5.3 mEq/L Final  . Chloride 10/28/2013 104  96 - 112 mEq/L Final  . CO2 10/28/2013 26  19 - 32 mEq/L Final  . Glucose, Bld 10/28/2013 122* 70 - 99 mg/dL Final  . BUN 10/28/2013 13  6 - 23 mg/dL Final  . Creatinine, Ser 10/28/2013 0.67  0.50 - 1.10 mg/dL Final  . Calcium 10/28/2013 8.5  8.4 - 10.5 mg/dL Final  . GFR calc non Af Amer 10/28/2013 84* >90 mL/min Final  . GFR calc Af Amer 10/28/2013 >90  >90 mL/min Final   Comment: (NOTE)  The eGFR has been calculated using the CKD EPI equation.                          This calculation has not been validated in all clinical situations.                          eGFR's persistently <90 mL/min signify possible Chronic Kidney                          Disease.  . Anion gap 10/28/2013 10  5 - 15 Final  . Glucose-Capillary 10/27/2013 147* 70 - 99 mg/dL Final  . Glucose-Capillary 10/28/2013 107* 70 - 99 mg/dL Final  . Glucose-Capillary 10/28/2013 100* 70 - 99 mg/dL Final  . WBC 10/29/2013 9.1  4.0 - 10.5 K/uL Final   WHITE COUNT CONFIRMED ON SMEAR  . RBC 10/29/2013 3.17* 3.87 - 5.11 MIL/uL Final  . Hemoglobin 10/29/2013 9.3* 12.0 - 15.0 g/dL Final  .  HCT 10/29/2013 27.5* 36.0 - 46.0 % Final  . MCV 10/29/2013 86.8  78.0 - 100.0 fL Final  . MCH 10/29/2013 29.3  26.0 - 34.0 pg Final  . MCHC 10/29/2013 33.8  30.0 - 36.0 g/dL Final  . RDW 10/29/2013 12.7  11.5 - 15.5 % Final  . Platelets 10/29/2013 142* 150 - 400 K/uL Final  . Sodium 10/29/2013 137  137 - 147 mEq/L Final  . Potassium 10/29/2013 3.6* 3.7 - 5.3 mEq/L Final  . Chloride 10/29/2013 100  96 - 112 mEq/L Final  . CO2 10/29/2013 27  19 - 32 mEq/L Final  . Glucose, Bld 10/29/2013 129* 70 - 99 mg/dL Final  . BUN 10/29/2013 12  6 - 23 mg/dL Final  . Creatinine, Ser 10/29/2013 0.62  0.50 - 1.10 mg/dL Final  . Calcium 10/29/2013 8.5  8.4 - 10.5 mg/dL Final  . GFR calc non Af Amer 10/29/2013 87* >90 mL/min Final  . GFR calc Af Amer 10/29/2013 >90  >90 mL/min Final   Comment: (NOTE)                          The eGFR has been calculated using the CKD EPI equation.                          This calculation has not been validated in all clinical situations.                          eGFR's persistently <90 mL/min signify possible Chronic Kidney                          Disease.  . Anion gap 10/29/2013 10  5 - 15 Final  . Glucose-Capillary 10/28/2013 143* 70 - 99 mg/dL Final  . Glucose-Capillary 10/28/2013 110* 70 - 99 mg/dL Final  . Comment 1 10/28/2013 Notify RN   Final  . Glucose-Capillary 10/29/2013 130* 70 - 99 mg/dL Final  . Glucose-Capillary 10/29/2013 113* 70 - 99 mg/dL Final  Hospital Outpatient Visit on 10/21/2013  Component Date Value Ref Range Status  . WBC 10/21/2013 7.2  4.0 - 10.5 K/uL Final  . RBC 10/21/2013 4.59  3.87 - 5.11 MIL/uL Final  . Hemoglobin 10/21/2013 12.8  12.0 - 15.0 g/dL Final  .  HCT 10/21/2013 39.6  36.0 - 46.0 % Final  . MCV 10/21/2013 86.3  78.0 - 100.0 fL Final  . MCH 10/21/2013 27.9  26.0 - 34.0 pg Final  . MCHC 10/21/2013 32.3  30.0 - 36.0 g/dL Final  . RDW 95/66/7177 12.6  11.5 - 15.5 % Final  . Platelets 10/21/2013 197  150 - 400 K/uL Final  .  MRSA, PCR 10/21/2013 NEGATIVE  NEGATIVE Final  . Staphylococcus aureus 10/21/2013 NEGATIVE  NEGATIVE Final   Comment:                                 The Xpert SA Assay (FDA                          approved for NASAL specimens                          in patients over 42 years of age),                          is one component of                          a comprehensive surveillance                          program.  Test performance has                          been validated by [employer] for patients greater                          than or equal to 11 year old.                          It is not intended                          to diagnose infection nor to                          guide or monitor treatment.  Marland Kitchen aPTT 10/21/2013 32  24 - 37 seconds Final  . Sodium 10/21/2013 143  137 - 147 mEq/L Final  . Potassium 10/21/2013 5.1  3.7 - 5.3 mEq/L Final   Comment: MODERATE HEMOLYSIS                          HEMOLYSIS AT THIS LEVEL MAY AFFECT RESULT  . Chloride 10/21/2013 102  96 - 112 mEq/L Final  . CO2 10/21/2013 28  19 - 32 mEq/L Final  . Glucose, Bld 10/21/2013 111* 70 - 99 mg/dL Final  . BUN 95/64/6290 18  6 - 23 mg/dL Final  . Creatinine, Ser 10/21/2013 0.85  0.50 - 1.10 mg/dL Final  . Calcium 09/44/6155 9.7  8.4 - 10.5 mg/dL Final  . Total Protein 10/21/2013 7.4  6.0 - 8.3 g/dL Final  .  Albumin 10/21/2013 4.0  3.5 - 5.2 g/dL Final  . AST 06/00/4599 28  0 - 37 U/L Final   Comment: MODERATE HEMOLYSIS                          HEMOLYSIS AT THIS LEVEL MAY AFFECT RESULT  . ALT 10/21/2013 22  0 - 35 U/L Final   Comment: MODERATE HEMOLYSIS                          HEMOLYSIS AT THIS LEVEL MAY AFFECT RESULT  . Alkaline Phosphatase 10/21/2013 69  39 - 117 U/L Final  . Total Bilirubin 10/21/2013 0.3  0.3 - 1.2 mg/dL Final  . GFR calc non Af Amer 10/21/2013 66* >90 mL/min Final  . GFR calc Af Amer 10/21/2013 76* >90 mL/min Final   Comment: (NOTE)                           The eGFR has been calculated using the CKD EPI equation.                          This calculation has not been validated in all clinical situations.                          eGFR's persistently <90 mL/min signify possible Chronic Kidney                          Disease.  . Anion gap 10/21/2013 13  5 - 15 Final  . Prothrombin Time 10/21/2013 13.6  11.6 - 15.2 seconds Final  . INR 10/21/2013 1.03  0.00 - 1.49 Final  . Color, Urine 10/21/2013 YELLOW  YELLOW Final  . APPearance 10/21/2013 CLEAR  CLEAR Final  . Specific Gravity, Urine 10/21/2013 1.013  1.005 - 1.030 Final  . pH 10/21/2013 5.5  5.0 - 8.0 Final  . Glucose, UA 10/21/2013 NEGATIVE  NEGATIVE mg/dL Final  . Hgb urine dipstick 10/21/2013 NEGATIVE  NEGATIVE Final  . Bilirubin Urine 10/21/2013 NEGATIVE  NEGATIVE Final  . Ketones, ur 10/21/2013 NEGATIVE  NEGATIVE mg/dL Final  . Protein, ur 77/41/4239 NEGATIVE  NEGATIVE mg/dL Final  . Urobilinogen, UA 10/21/2013 0.2  0.0 - 1.0 mg/dL Final  . Nitrite 53/20/2334 NEGATIVE  NEGATIVE Final  . Leukocytes, UA 10/21/2013 NEGATIVE  NEGATIVE Final   MICROSCOPIC NOT DONE ON URINES WITH NEGATIVE PROTEIN, BLOOD, LEUKOCYTES, NITRITE, OR GLUCOSE <1000 mg/dL.     X-Rays:Dg Chest 2 View  10/21/2013   CLINICAL DATA:  Hypertension. Preoperative for left total hip replacement.  EXAM: CHEST  2 VIEW  COMPARISON:  03/20/2010  FINDINGS: Thoracic spondylosis.  Lower cervical plate and screw fixator.  Cardiac and mediastinal margins appear normal. The lungs appear clear. No pleural effusion.  IMPRESSION: 1. No active cardiopulmonary disease. 2. Thoracic spondylosis.   Electronically Signed   By: Herbie Baltimore M.D.   On: 10/21/2013 13:47   Dg Hip Portable 1 View Left  10/27/2013   CLINICAL DATA:  Status post total hip replacement  EXAM: PORTABLE LEFT HIP - 1 VIEW  COMPARISON:  None.  FINDINGS: Frontal view obtained. There is a total hip prosthesis on the left with prosthetic components appearing  well-seated. There is no acute fracture or dislocation. Air in the joint and  surrounding soft tissues is felt to be of postoperative etiology.  IMPRESSION: Total hip prosthesis components appear well seated. No acute fracture or dislocation.   Electronically Signed   By: Lowella Grip M.D.   On: 10/27/2013 13:58    EKG: Orders placed during the hospital encounter of 10/27/13  . EKG     Hospital Course: Patient was admitted to [facility] and taken to the OR and underwent the above state procedure without complications.  Patient tolerated the procedure well and was later transferred to the recovery room and then to the orthopaedic floor for postoperative care.  They were given PO and IV analgesics for pain control following their surgery.  They were given 24 hours of postoperative antibiotics of  Anti-infectives   Start     Dose/Rate Route Frequency Ordered Stop   10/27/13 1800  ceFAZolin (ANCEF) IVPB 1 g/50 mL premix     1 g 100 mL/hr over 30 Minutes Intravenous Every 6 hours 10/27/13 1632 10/27/13 2338   10/27/13 1135  polymyxin B 500,000 Units, bacitracin 50,000 Units in sodium chloride irrigation 0.9 % 500 mL irrigation  Status:  Discontinued       As needed 10/27/13 1135 10/27/13 1314   10/27/13 0854  ceFAZolin (ANCEF) IVPB 2 g/50 mL premix     2 g 100 mL/hr over 30 Minutes Intravenous On call to O.R. 10/27/13 7408 10/27/13 1101     and started on DVT prophylaxis in the form of Xarelto.   PT and OT were ordered for total hip protocol.  The patient was allowed to be PWB 50% with therapy. Discharge planning was consulted to help with postop disposition and equipment needs.  Patient had a decent night on the evening of surgery.  They started to get up OOB with therapy on day one.  Hemovac drain was pulled without difficulty.  The knee immobilizer was removed and discontinued.  Continued to work with therapy into day two.  Patient had progressed with therapy and meeting their goals.  Patient was seen in rounds and was ready to go home.   Diet: Diabetic diet Activity:PWB 50%  No bending hip over 90 degrees- A "L" Angle Do not cross legs Do not let foot roll inward When turning these patients a pillow should be placed between the patient's legs to prevent crossing. Patients should have the affected knee fully extended when trying to sit or stand from all surfaces to prevent excessive hip flexion. When ambulating and turning toward the affected side the affected leg should have the toes turned out prior to moving the walker and the rest of patient's body as to prevent internal rotation/ turning in of the leg. Abduction pillows are the most effective way to prevent a patient from not crossing legs or turning toes in at rest. If an abduction pillow is not ordered placing a regular pillow length wise between the patient's legs is also an effective reminder. It is imperative that these precautions be maintained so that the surgical hip does not dislocate. Follow-up:in 2 weeks Disposition - Home Discharged Condition: stable   Discharge Instructions   Call MD / Call 911    Complete by:  As directed   If you experience chest pain or shortness of breath, CALL 911 and be transported to the hospital emergency room.  If you develope a fever above 101 F, pus (white drainage) or increased drainage or redness at the wound, or calf pain, call your surgeon's office.  Constipation Prevention    Complete by:  As directed   Drink plenty of fluids.  Prune juice may be helpful.  You may use a stool softener, such as Colace (over the counter) 100 mg twice a day.  Use MiraLax (over the counter) for constipation as needed.     Diet Carb Modified    Complete by:  As directed      Discharge instructions    Complete by:  As directed   Walk with your walker. Partial weightbearing left LE for one week. Then progress to WBAT. Do not change the dressing unless there is excess drainage Shower  only, no tub bath. Do not take vitamins, supplements, or NSAIDs while on Xarelto (except for potassium and iron).  Call if any temperatures greater than 101 or any wound complications: [phone] during the day and ask for Dr. Charlestine Night nurse, Brunilda Payor.     Driving restrictions    Complete by:  As directed   No driving for 2 weeks     Follow the hip precautions as taught in Physical Therapy    Complete by:  As directed      Increase activity slowly as tolerated    Complete by:  As directed             Medication List    STOP taking these medications       aspirin 81 MG tablet     CALCIUM-MAGNESIUM-ZINC PO     HYDROcodone-acetaminophen 5-325 MG per tablet  Commonly known as:  NORCO/VICODIN     meloxicam 15 MG tablet  Commonly known as:  MOBIC     metFORMIN 500 MG tablet  Commonly known as:  GLUCOPHAGE     Vitamin D 2000 UNITS Caps      TAKE these medications       bisacodyl 5 MG EC tablet  Commonly known as:  DULCOLAX  Take 1 tablet (5 mg total) by mouth daily as needed for moderate constipation.     ferrous sulfate 325 (65 FE) MG tablet  Take 1 tablet (325 mg total) by mouth 3 (three) times daily after meals.     furosemide 40 MG tablet  Commonly known as:  LASIX  Take 40 mg by mouth every morning.     gabapentin 300 MG capsule  Commonly known as:  NEURONTIN  Take 300 mg by mouth 3 (three) times daily.     losartan 50 MG tablet  Commonly known as:  COZAAR  Take 50 mg by mouth every morning.     methocarbamol 500 MG tablet  Commonly known as:  ROBAXIN  Take 1 tablet (500 mg total) by mouth every 6 (six) hours as needed for muscle spasms.     oxyCODONE 5 MG immediate release tablet  Commonly known as:  Oxy IR/ROXICODONE  Take 1-3 tablets (5-15 mg total) by mouth every 3 (three) hours as needed for severe pain.     potassium chloride SA 20 MEQ tablet  Commonly known as:  K-DUR,KLOR-CON  Take 20 mEq by mouth daily.     rivaroxaban 10 MG Tabs tablet    Commonly known as:  XARELTO  Take 1 tablet (10 mg total) by mouth daily with breakfast.     triamcinolone cream 0.1 %  Commonly known as:  KENALOG  Apply 1 application topically 2 (two) times daily as needed (rash).           Follow-up Information   Follow up with [facility]. (home  health physical therapy)    Contact information:   [facility] [address] [place] [address] [phone]       Follow up with GIOFFRE,RONALD A, MD. Schedule an appointment as soon as possible for a visit in 2 weeks.   Specialty:  Orthopedic Surgery   Contact information:   [address] [place] [address] [phone]       Signed: Ardeen Jourdain, PA-C Orthopaedic Surgery 11/07/2013, 7:57 AM

## 2014-01-17 ENCOUNTER — Encounter: Payer: Self-pay | Admitting: Nurse Practitioner

## 2014-01-17 ENCOUNTER — Ambulatory Visit (INDEPENDENT_AMBULATORY_CARE_PROVIDER_SITE_OTHER): Payer: Medicare Other | Admitting: Nurse Practitioner

## 2014-01-17 VITALS — BP 120/78 | Ht 64.0 in | Wt 200.8 lb

## 2014-01-17 DIAGNOSIS — I10 Essential (primary) hypertension: Secondary | ICD-10-CM

## 2014-01-17 DIAGNOSIS — R7309 Other abnormal glucose: Secondary | ICD-10-CM

## 2014-01-17 DIAGNOSIS — D649 Anemia, unspecified: Secondary | ICD-10-CM

## 2014-01-17 DIAGNOSIS — E785 Hyperlipidemia, unspecified: Secondary | ICD-10-CM

## 2014-01-17 DIAGNOSIS — R7303 Prediabetes: Secondary | ICD-10-CM

## 2014-01-17 DIAGNOSIS — E559 Vitamin D deficiency, unspecified: Secondary | ICD-10-CM

## 2014-01-17 MED ORDER — HYDROCODONE-ACETAMINOPHEN 5-325 MG PO TABS: ORAL_TABLET | ORAL | Status: DC

## 2014-01-17 MED ORDER — ASPIRIN EC 325 MG PO TBEC: 325.0000 mg | DELAYED_RELEASE_TABLET | Freq: Every day | ORAL | Status: DC

## 2014-01-17 NOTE — Progress Notes (Signed)
Subjective:  Presents for routine follow-up. Had knee replacement in October. Was told to switch to aspirin 325 mg daily. Patient understood this was a permanent change but unsure why. Has an appointment with orthopedic specialist in January. Continues to have pain in the right knee. Still some limitations on activity. Difficulty sleeping. Did not go to sleep until about 2 AM this morning. States "she just couldn't sleep". Doing well with diet. No chest pain/ischemic type pain or shortness of breath. No difficulty speaking or swallowing. No numbness or weakness of the face arms or legs. Was also on iron tablets temporarily, has since stopped this. Using Lasix and potassium on occasion for fluid. Was on oxycodone temporarily after her surgery but does not like the side effects of this medication. Would like to switch back to hydrocodone 5 mg. Usually takes 1 in the morning to help her get going and perform daily activities. Will take 1 at nighttime if pain is severe.  Objective:   BP 120/78 mmHg  Ht 5\' 4"  (1.626 m)  Wt 200 lb 12.8 oz (91.082 kg)  BMI 34.45 kg/m2 NAD. Alert, oriented. Lungs clear. Heart regular rate rhythm. No change in murmur. Lower extremities trace pitting edema.  Assessment:Vitamin D deficiency - Plan: Vit D  25 hydroxy (rtn osteoporosis monitoring)  Prediabetes - Plan: Hemoglobin A1c  Hyperlipemia - Plan: Lipid panel, Hepatic function panel  Essential hypertension, benign - Plan: Hepatic function panel, Basic metabolic panel  Anemia, unspecified anemia type - Plan: CBC with Differential  Plan: Slowly increase activity. Will refill her hydrocodone with 2 separate monthly refills. Follow-up in 3 months, obtain lab work before next visit. Call back sooner if any problems.

## 2014-01-17 NOTE — Patient Instructions (Signed)
Melatonin 5 mg Vitamin D 1000 units per day Calcium 1200-1500 mg per day

## 2014-02-25 ENCOUNTER — Other Ambulatory Visit: Payer: Self-pay | Admitting: Nurse Practitioner

## 2014-03-02 ENCOUNTER — Other Ambulatory Visit (HOSPITAL_COMMUNITY): Payer: Self-pay | Admitting: Orthopedic Surgery

## 2014-03-02 DIAGNOSIS — G8929 Other chronic pain: Secondary | ICD-10-CM

## 2014-03-02 DIAGNOSIS — M25511 Pain in right shoulder: Principal | ICD-10-CM

## 2014-03-09 ENCOUNTER — Ambulatory Visit (HOSPITAL_COMMUNITY)
Admission: RE | Admit: 2014-03-09 | Discharge: 2014-03-09 | Disposition: A | Discharge status: Home / Self Care | Payer: PPO | Source: Ambulatory Visit | Attending: Orthopedic Surgery | Admitting: Orthopedic Surgery

## 2014-03-09 DIAGNOSIS — M24011 Loose body in right shoulder: Secondary | ICD-10-CM | POA: Diagnosis not present

## 2014-03-09 DIAGNOSIS — M19011 Primary osteoarthritis, right shoulder: Secondary | ICD-10-CM | POA: Insufficient documentation

## 2014-03-09 DIAGNOSIS — M25511 Pain in right shoulder: Secondary | ICD-10-CM | POA: Insufficient documentation

## 2014-03-09 DIAGNOSIS — M75111 Incomplete rotator cuff tear or rupture of right shoulder, not specified as traumatic: Secondary | ICD-10-CM | POA: Insufficient documentation

## 2014-03-09 DIAGNOSIS — G8929 Other chronic pain: Secondary | ICD-10-CM

## 2014-04-04 ENCOUNTER — Other Ambulatory Visit: Payer: Self-pay | Admitting: Family Medicine

## 2014-04-20 ENCOUNTER — Ambulatory Visit: Payer: Medicare Other | Admitting: Nurse Practitioner

## 2014-04-20 LAB — HEPATIC FUNCTION PANEL
ALBUMIN: 3.9 g/dL (ref 3.5–5.2)
ALK PHOS: 63 U/L (ref 39–117)
ALT: 14 U/L (ref 0–35)
AST: 16 U/L (ref 0–37)
BILIRUBIN TOTAL: 0.4 mg/dL (ref 0.2–1.2)
Bilirubin, Direct: 0.1 mg/dL (ref 0.0–0.3)
Indirect Bilirubin: 0.3 mg/dL (ref 0.2–1.2)
Total Protein: 6.6 g/dL (ref 6.0–8.3)

## 2014-04-20 LAB — BASIC METABOLIC PANEL
BUN: 17 mg/dL (ref 6–23)
CALCIUM: 9.6 mg/dL (ref 8.4–10.5)
CO2: 24 meq/L (ref 19–32)
Chloride: 107 mEq/L (ref 96–112)
Creat: 0.75 mg/dL (ref 0.50–1.10)
Glucose, Bld: 97 mg/dL (ref 70–99)
Potassium: 3.8 mEq/L (ref 3.5–5.3)
Sodium: 142 mEq/L (ref 135–145)

## 2014-04-20 LAB — CBC WITH DIFFERENTIAL/PLATELET
BASOS PCT: 1 % (ref 0–1)
Basophils Absolute: 0.1 10*3/uL (ref 0.0–0.1)
Eosinophils Absolute: 0.1 10*3/uL (ref 0.0–0.7)
Eosinophils Relative: 2 % (ref 0–5)
HCT: 38.5 % (ref 36.0–46.0)
Hemoglobin: 12.3 g/dL (ref 12.0–15.0)
Lymphocytes Relative: 29 % (ref 12–46)
Lymphs Abs: 2.1 10*3/uL (ref 0.7–4.0)
MCH: 27.3 pg (ref 26.0–34.0)
MCHC: 31.9 g/dL (ref 30.0–36.0)
MCV: 85.6 fL (ref 78.0–100.0)
MONO ABS: 0.8 10*3/uL (ref 0.1–1.0)
MONOS PCT: 11 % (ref 3–12)
MPV: 11 fL (ref 9.4–12.4)
NEUTROS PCT: 57 % (ref 43–77)
Neutro Abs: 4 10*3/uL (ref 1.7–7.7)
PLATELETS: 217 10*3/uL (ref 150–400)
RBC: 4.5 MIL/uL (ref 3.87–5.11)
RDW: 14.9 % (ref 11.5–15.5)
WBC: 7.1 10*3/uL (ref 4.0–10.5)

## 2014-04-20 LAB — LIPID PANEL
Cholesterol: 161 mg/dL (ref 0–200)
HDL: 41 mg/dL — ABNORMAL LOW (ref >=46)
LDL CALC: 93 mg/dL (ref 0–99)
Total CHOL/HDL Ratio: 3.9 Ratio
Triglycerides: 133 mg/dL (ref <150)
VLDL: 27 mg/dL (ref 0–40)

## 2014-04-20 LAB — HEMOGLOBIN A1C
Hgb A1c MFr Bld: 5.8 % — ABNORMAL HIGH (ref <5.7)
Mean Plasma Glucose: 120 mg/dL — ABNORMAL HIGH (ref <117)

## 2014-04-20 LAB — VITAMIN D 25 HYDROXY (VIT D DEFICIENCY, FRACTURES): Vit D, 25-Hydroxy: 29 ng/mL — ABNORMAL LOW (ref 30–100)

## 2014-04-22 ENCOUNTER — Encounter: Payer: Self-pay | Admitting: Nurse Practitioner

## 2014-04-22 ENCOUNTER — Ambulatory Visit (INDEPENDENT_AMBULATORY_CARE_PROVIDER_SITE_OTHER): Payer: PPO | Admitting: Nurse Practitioner

## 2014-04-22 VITALS — BP 128/84 | Ht 64.0 in | Wt 202.0 lb

## 2014-04-22 DIAGNOSIS — M158 Other polyosteoarthritis: Secondary | ICD-10-CM | POA: Diagnosis not present

## 2014-04-22 DIAGNOSIS — E785 Hyperlipidemia, unspecified: Secondary | ICD-10-CM | POA: Diagnosis not present

## 2014-04-22 DIAGNOSIS — R7303 Prediabetes: Secondary | ICD-10-CM

## 2014-04-22 DIAGNOSIS — R7309 Other abnormal glucose: Secondary | ICD-10-CM | POA: Diagnosis not present

## 2014-04-22 DIAGNOSIS — I10 Essential (primary) hypertension: Secondary | ICD-10-CM

## 2014-04-24 ENCOUNTER — Encounter: Payer: Self-pay | Admitting: Nurse Practitioner

## 2014-04-24 NOTE — Progress Notes (Signed)
Subjective:  Presents for routine follow up. Taking pain medicine twice a day which allows her to perform ADLs. Tries to get out in the community most days. Pain medication helps bring down her pain to a tolerable level with her chronic arthritis. Had hip replacement in October. Doing well. No CP/ischemic type pain or SOB. Doing well with her diet. Has slowly increased her walking.  Objective:   BP 128/84 mmHg  Ht 5\' 4"  (1.626 m)  Wt 202 lb (91.627 kg)  BMI 34.66 kg/m2 NAD. Alert, oriented. Lungs clear. Heart RRR. Lower extremities trace edema. See labs dated 04/19/14. Results reviewed with patient.   Assessment:  Problem List Items Addressed This Visit      Cardiovascular and Mediastinum   Essential hypertension, benign - Primary (Chronic)   Relevant Medications   aspirin 81 MG tablet     Endocrine   Prediabetes (Chronic)     Musculoskeletal and Integument   Degenerative arthritis (Chronic)   Relevant Medications   aspirin 81 MG tablet     Other   Hyperlipemia (Chronic)   Relevant Medications   aspirin 81 MG tablet     Plan:  Continue current regimen. Continue daily vitamin D. Slowly increase activity as tolerated. Reminded about preventive health physical. Return in about 3 months (around 07/22/2014) for recheck.

## 2014-05-23 ENCOUNTER — Telehealth: Payer: Self-pay | Admitting: Nurse Practitioner

## 2014-05-23 NOTE — Telephone Encounter (Signed)
Patient requesting Rx for HYDROcodone-acetaminophen (NORCO/VICODIN) 5-325 MG per tablet.  She said that she probably has about a week supply left.

## 2014-05-25 ENCOUNTER — Other Ambulatory Visit: Payer: Self-pay | Admitting: Nurse Practitioner

## 2014-05-25 MED ORDER — HYDROCODONE-ACETAMINOPHEN 5-325 MG PO TABS: ORAL_TABLET | ORAL | Status: DC

## 2014-06-10 ENCOUNTER — Ambulatory Visit (INDEPENDENT_AMBULATORY_CARE_PROVIDER_SITE_OTHER): Payer: PPO | Admitting: Nurse Practitioner

## 2014-06-10 ENCOUNTER — Encounter: Payer: Self-pay | Admitting: Nurse Practitioner

## 2014-06-10 VITALS — BP 112/70 | Ht 64.0 in | Wt 207.0 lb

## 2014-06-10 DIAGNOSIS — L989 Disorder of the skin and subcutaneous tissue, unspecified: Secondary | ICD-10-CM | POA: Diagnosis not present

## 2014-06-10 DIAGNOSIS — M546 Pain in thoracic spine: Secondary | ICD-10-CM

## 2014-06-10 MED ORDER — MELOXICAM 15 MG PO TABS: 15.0000 mg | ORAL_TABLET | Freq: Every day | ORAL | Status: DC

## 2014-06-14 ENCOUNTER — Encounter: Payer: Self-pay | Admitting: Nurse Practitioner

## 2014-06-14 NOTE — Progress Notes (Signed)
Subjective:  Presents with complaints of a small skin lesion on the right side of her face first noticed in April. Has seen Dr. Tarri Glenn in [place] in the past. Also complaints of localized pain in the left scapula worse with certain movements. No specific history of injury. No abdominal pain. No fever or cough. No shortness of breath. Taking her gabapentin 100 mg 2 by mouth twice a day which is working much better.  Objective:   BP 112/70 mmHg  Ht 5\' 4"  (1.626 m)  Wt 207 lb (93.895 kg)  BMI 35.51 kg/m2 NAD. Alert, oriented. Lungs clear. Heart regular rate rhythm. Small slightly raised minimally discolored lesion on the right side of the face. Significant sun damaged noted. Tenderness along the left thoracic area just medial to the scapula. Tight tender muscles noted.  Assessment: Facial lesion  Left-sided thoracic back pain  Plan:  Meds ordered this encounter  Medications  . meloxicam (MOBIC) 15 MG tablet    Sig: Take 1 tablet (15 mg total) by mouth daily.    Dispense:  30 tablet    Refill:  2    Order Specific Question:  Supervising Provider    Answer:  Mikey Kirschner [[id number]]   Ice/heat to the back area. Muscle relaxant and mobic as directed. Call back in 10-14 days if no improvement, sooner if worse. Patient to make an appointment with her dermatologist for evaluation of facial lesion. Let us know when she needs refills on her gabapentin with new dosing. Return if symptoms worsen or fail to improve.

## 2014-06-27 ENCOUNTER — Other Ambulatory Visit: Payer: Self-pay | Admitting: Nurse Practitioner

## 2014-07-05 ENCOUNTER — Telehealth: Payer: Self-pay | Admitting: Family Medicine

## 2014-07-05 MED ORDER — HYDROCODONE-ACETAMINOPHEN 5-325 MG PO TABS: ORAL_TABLET | ORAL | Status: DC

## 2014-07-05 NOTE — Telephone Encounter (Signed)
Ok times one 

## 2014-07-05 NOTE — Telephone Encounter (Signed)
Patient notified that prescription is ready for pickup at the front desk.

## 2014-07-05 NOTE — Telephone Encounter (Signed)
Last chronic visit 04/22/14

## 2014-07-05 NOTE — Telephone Encounter (Signed)
Pt is requesting a refill on her hydrocodone.  

## 2014-07-08 ENCOUNTER — Telehealth: Payer: Self-pay | Admitting: Nurse Practitioner

## 2014-07-08 NOTE — Telephone Encounter (Signed)
Patient needing referral to Dr. Tarri Glenn dermatology in [place].

## 2014-07-22 ENCOUNTER — Ambulatory Visit (INDEPENDENT_AMBULATORY_CARE_PROVIDER_SITE_OTHER): Payer: PPO | Admitting: Nurse Practitioner

## 2014-07-22 VITALS — BP 122/84 | Wt 207.0 lb

## 2014-07-22 DIAGNOSIS — Z79891 Long term (current) use of opiate analgesic: Secondary | ICD-10-CM

## 2014-07-22 DIAGNOSIS — M159 Polyosteoarthritis, unspecified: Secondary | ICD-10-CM

## 2014-07-22 DIAGNOSIS — L989 Disorder of the skin and subcutaneous tissue, unspecified: Secondary | ICD-10-CM

## 2014-07-22 MED ORDER — GABAPENTIN 100 MG PO CAPS: ORAL_CAPSULE | ORAL | Status: DC

## 2014-07-22 MED ORDER — MELOXICAM 15 MG PO TABS: 15.0000 mg | ORAL_TABLET | Freq: Every day | ORAL | Status: DC

## 2014-07-25 ENCOUNTER — Encounter: Payer: Self-pay | Admitting: Nurse Practitioner

## 2014-07-25 DIAGNOSIS — Z79891 Long term (current) use of opiate analgesic: Secondary | ICD-10-CM | POA: Insufficient documentation

## 2014-07-25 NOTE — Addendum Note (Signed)
Addended by: Nilda Simmer on: 07/25/2014 09:49 PM   Modules accepted: Orders

## 2014-07-25 NOTE — Progress Notes (Addendum)
Subjective:  Presents for recheck on chronic pain. Has chronic orthopedic issues. Right knee replacement which causes chronic pain. Takes meloxicam and gabapentin daily. Takes pain medication twice per day. Has been doing daily outings in the mornings. Also able to do housework. Has a chronic lesion on right side of face; requesting referral to Dr. Tarri Glenn.   Objective:   BP 122/84 mmHg  Wt 207 lb (93.895 kg) NAD. Alert, oriented. Lungs clear. Heart RRR. Dry raised lesion on right side of face. Significant sun damage noted.   Assessment:  Problem List Items Addressed This Visit      Musculoskeletal and Integument   Degenerative arthritis (Chronic)   Relevant Medications   meloxicam (MOBIC) 15 MG tablet     Other   Encounter for long-term opiate analgesic use - Primary   Relevant Orders   ToxASSURE Select 13 (MW), Urine (Completed)    Other Visit Diagnoses    Facial lesion        Relevant Orders    Ambulatory referral to Dermatology       Plan:  Meds ordered this encounter  Medications  . Cholecalciferol (VITAMIN D PO)    Sig: Take by mouth 2 (two) times daily.  Marland Kitchen CALCIUM PO    Sig: Take by mouth 2 (two) times daily.  Marland Kitchen MAGNESIUM PO    Sig: Take by mouth 2 (two) times daily.  . meloxicam (MOBIC) 15 MG tablet    Sig: Take 1 tablet (15 mg total) by mouth daily.    Dispense:  90 tablet    Refill:  1    Order Specific Question:  Supervising Provider    Answer:  Mikey Kirschner [[id number]]  . gabapentin (NEURONTIN) 100 MG capsule    Sig: TAKE 1 CAPSULE BY MOUTH THREE TIMES A DAY.    Dispense:  270 capsule    Refill:  1    Order Specific Question:  Supervising Provider    Answer:  Mikey Kirschner [[id number]]   Does not need refill on pain med today. Controlled substances agreement and opoid risk tool done.  Return in about 3 months (around 10/22/2014) for recheck.

## 2014-07-29 LAB — TOXASSURE SELECT 13 (MW), URINE: PDF: 0

## 2014-08-01 ENCOUNTER — Other Ambulatory Visit: Payer: Self-pay | Admitting: Nurse Practitioner

## 2014-08-01 ENCOUNTER — Other Ambulatory Visit: Payer: Self-pay | Admitting: Family Medicine

## 2014-08-30 ENCOUNTER — Telehealth: Payer: Self-pay | Admitting: Family Medicine

## 2014-08-30 NOTE — Telephone Encounter (Signed)
Pt dropped off a form for a handicap placard to be filled out. Message in box.

## 2014-08-31 NOTE — Telephone Encounter (Signed)
completed

## 2014-09-08 ENCOUNTER — Telehealth: Payer: Self-pay | Admitting: Family Medicine

## 2014-09-08 NOTE — Telephone Encounter (Signed)
LMRC

## 2014-09-08 NOTE — Telephone Encounter (Signed)
yes

## 2014-09-08 NOTE — Telephone Encounter (Signed)
Patient wants to know if she and her husband can take aleve with high blood pressure.

## 2014-09-09 NOTE — Telephone Encounter (Signed)
Patient notified and verbalized understanding. 

## 2014-09-13 LAB — HM DIABETES EYE EXAM

## 2014-09-19 ENCOUNTER — Encounter: Payer: Self-pay | Admitting: *Deleted

## 2014-09-30 ENCOUNTER — Other Ambulatory Visit: Payer: Self-pay | Admitting: Nurse Practitioner

## 2014-10-26 ENCOUNTER — Ambulatory Visit (INDEPENDENT_AMBULATORY_CARE_PROVIDER_SITE_OTHER): Payer: PPO | Admitting: Nurse Practitioner

## 2014-10-26 ENCOUNTER — Telehealth: Payer: Self-pay | Admitting: *Deleted

## 2014-10-26 ENCOUNTER — Encounter: Payer: Self-pay | Admitting: Nurse Practitioner

## 2014-10-26 ENCOUNTER — Other Ambulatory Visit: Payer: Self-pay | Admitting: *Deleted

## 2014-10-26 VITALS — BP 134/82 | Ht 64.0 in | Wt 210.0 lb

## 2014-10-26 DIAGNOSIS — Z79891 Long term (current) use of opiate analgesic: Secondary | ICD-10-CM

## 2014-10-26 DIAGNOSIS — I350 Nonrheumatic aortic (valve) stenosis: Secondary | ICD-10-CM | POA: Diagnosis not present

## 2014-10-26 DIAGNOSIS — M6248 Contracture of muscle, other site: Secondary | ICD-10-CM | POA: Diagnosis not present

## 2014-10-26 DIAGNOSIS — Z23 Encounter for immunization: Secondary | ICD-10-CM | POA: Diagnosis not present

## 2014-10-26 DIAGNOSIS — I10 Essential (primary) hypertension: Secondary | ICD-10-CM

## 2014-10-26 DIAGNOSIS — M62838 Other muscle spasm: Secondary | ICD-10-CM

## 2014-10-26 MED ORDER — HYDROCODONE-ACETAMINOPHEN 5-325 MG PO TABS: ORAL_TABLET | ORAL | Status: DC

## 2014-10-26 NOTE — Progress Notes (Signed)
Subjective:  Presents for routine follow-up. Patient is trying to do walking every day. Does not take her hydrocodone every day, sometimes will take it more than twice per day if needed. No chest pain/ischemic type pain or shortness of breath. Edema much improved with Lasix. Having some localized muscle spasms along the right lateral neck area, much improved.  Objective:   BP 134/82 mmHg  Ht 5\' 4"  (1.626 m)  Wt 210 lb (95.255 kg)  BMI 36.03 kg/m2 NAD. Alert, oriented. Lungs clear. Heart regular rate rhythm. No change in her murmur. Tight tender muscles noted along the lower portion of the right lateral neck area. Normal ROM noted.  Assessment:  Problem List Items Addressed This Visit      Cardiovascular and Mediastinum   Aortic valve stenosis, mild (Chronic)   Essential hypertension, benign - Primary (Chronic)     Other   Encounter for long-term opiate analgesic use    Other Visit Diagnoses    Neck muscle spasm        Encounter for immunization          Plan:  Meds ordered this encounter  Medications  . DISCONTD: HYDROcodone-acetaminophen (NORCO/VICODIN) 5-325 MG tablet    Sig: TAKE (1) TABLET BY MOUTH TWICE A DAY AS NEEDED.    Dispense:  60 tablet    Refill:  0    Order Specific Question:  Supervising Provider    Answer:  Mikey Kirschner [[id number]]  . DISCONTD: HYDROcodone-acetaminophen (NORCO/VICODIN) 5-325 MG tablet    Sig: TAKE (1) TABLET BY MOUTH TWICE A DAY AS NEEDED.    Dispense:  60 tablet    Refill:  0    May fill 30 days from 10/26/14    Order Specific Question:  Supervising Provider    Answer:  Mikey Kirschner [[id number]]  . HYDROcodone-acetaminophen (NORCO/VICODIN) 5-325 MG tablet    Sig: TAKE (1) TABLET BY MOUTH TWICE A DAY AS NEEDED.    Dispense:  60 tablet    Refill:  0    May fill 60 days from 10/26/14    Order Specific Question:  Supervising Provider    Answer:  Mikey Kirschner [[id number]]   Ice/heat to the affected area on the neck. Gentle stretching exercises.  Gentle massage. Call back if worsens or persists. A review of the chart after the patient left the office shows she is due for a repeat cardiac echo for aortic stenosis. Nurse to notify patient. Return in about 3 months (around 01/26/2015) for recheck.

## 2014-10-29 ENCOUNTER — Other Ambulatory Visit: Payer: Self-pay | Admitting: Family Medicine

## 2014-10-29 ENCOUNTER — Other Ambulatory Visit: Payer: Self-pay | Admitting: Nurse Practitioner

## 2014-10-31 ENCOUNTER — Ambulatory Visit (HOSPITAL_COMMUNITY)
Admission: RE | Admit: 2014-10-31 | Discharge: 2014-10-31 | Disposition: A | Discharge status: Home / Self Care | Payer: PPO | Source: Ambulatory Visit | Attending: Nurse Practitioner | Admitting: Nurse Practitioner

## 2014-10-31 DIAGNOSIS — I10 Essential (primary) hypertension: Secondary | ICD-10-CM | POA: Insufficient documentation

## 2014-10-31 DIAGNOSIS — I350 Nonrheumatic aortic (valve) stenosis: Secondary | ICD-10-CM

## 2014-10-31 HISTORY — PX: TRANSTHORACIC ECHOCARDIOGRAM: SHX275

## 2014-12-05 ENCOUNTER — Telehealth: Payer: Self-pay | Admitting: Family Medicine

## 2014-12-05 DIAGNOSIS — R21 Rash and other nonspecific skin eruption: Secondary | ICD-10-CM

## 2014-12-05 NOTE — Telephone Encounter (Signed)
Pt is needing a referral to [facility] in [place] for the place on her face. [name] recommended her to make an appt with dermatology but office is requiring a referral.

## 2014-12-05 NOTE — Telephone Encounter (Signed)
Let's do 

## 2014-12-05 NOTE — Telephone Encounter (Signed)
Called patient and informed her per Dr.Steve Luking- Referral to [facility] was put in and someone from our office or [facility] will be contacting her with appointment information. Patient verbalized understanding.

## 2014-12-12 ENCOUNTER — Telehealth: Payer: Self-pay | Admitting: Family Medicine

## 2014-12-12 NOTE — Telephone Encounter (Signed)
Pt was ordered a 2D Echo on 10/10 she has not received a result as of yet. Upon looking at info, it was sent to Dr Nicki Reaper whom sent it back to cardiology  Stating not his patient, that it was Dr Jeannine Kitten. Can we get this resulted for the  Patient an let her know as soon as possible the result to this echo?

## 2014-12-14 NOTE — Telephone Encounter (Signed)
Nurses please help with this. Thanks!

## 2014-12-16 NOTE — Telephone Encounter (Signed)
Cardiac echo: still shows aortic stenosis but test is otherwise good; heart is pumping well. No major changes since last test.

## 2014-12-16 NOTE — Telephone Encounter (Signed)
Results discussed with patient. Patient advised Cardiac echo: still shows aortic stenosis but test is otherwise good; heart is pumping well. No major changes since last test. Patient verbalized understanding.

## 2014-12-16 NOTE — Telephone Encounter (Signed)
Results are under procedures

## 2015-01-20 ENCOUNTER — Ambulatory Visit (INDEPENDENT_AMBULATORY_CARE_PROVIDER_SITE_OTHER): Payer: PPO | Admitting: Family Medicine

## 2015-01-20 ENCOUNTER — Encounter: Payer: Self-pay | Admitting: Family Medicine

## 2015-01-20 VITALS — Temp 99.0°F | Ht 64.0 in | Wt 202.4 lb

## 2015-01-20 DIAGNOSIS — J019 Acute sinusitis, unspecified: Secondary | ICD-10-CM | POA: Diagnosis not present

## 2015-01-20 DIAGNOSIS — B9689 Other specified bacterial agents as the cause of diseases classified elsewhere: Secondary | ICD-10-CM

## 2015-01-20 MED ORDER — AMOXICILLIN 500 MG PO TABS: 500.0000 mg | ORAL_TABLET | Freq: Three times a day (TID) | ORAL | Status: DC

## 2015-01-20 NOTE — Progress Notes (Signed)
   Subjective:    Patient ID: [name], female    DOB: 1940-06-18, 74 y.o.   MRN: [id number]  Cough This is a new problem. The current episode started in the past 7 days. Associated symptoms include ear pain, a fever, headaches, myalgias, nasal congestion, rhinorrhea, a sore throat and wheezing. Pertinent negatives include no chest pain or shortness of breath.    sinus like symptoms over the past several days with sinus pressure drainage coughing congestion   Review of Systems  Constitutional: Positive for fever. Negative for activity change.  HENT: Positive for congestion, ear pain, rhinorrhea and sore throat.   Eyes: Negative for discharge.  Respiratory: Positive for cough and wheezing. Negative for shortness of breath.   Cardiovascular: Negative for chest pain.  Musculoskeletal: Positive for myalgias.  Neurological: Positive for headaches.       Objective:   Physical Exam  Constitutional: She appears well-developed.  HENT:  Head: Normocephalic.  Nose: Nose normal.  Mouth/Throat: Oropharynx is clear and moist. No oropharyngeal exudate.  Neck: Neck supple.  Cardiovascular: Normal rate and normal heart sounds.   No murmur heard. Pulmonary/Chest: Effort normal and breath sounds normal. She has no wheezes.  Lymphadenopathy:    She has no cervical adenopathy.  Skin: Skin is warm and dry.  Nursing note and vitals reviewed.         Assessment & Plan:   acute rhinosinusitis probable secondary to a viral illness should gradually get better warning signs discuss

## 2015-01-24 DIAGNOSIS — E1342 Other specified diabetes mellitus with diabetic polyneuropathy: Secondary | ICD-10-CM | POA: Diagnosis not present

## 2015-01-24 DIAGNOSIS — L97511 Non-pressure chronic ulcer of other part of right foot limited to breakdown of skin: Secondary | ICD-10-CM | POA: Diagnosis not present

## 2015-01-27 ENCOUNTER — Ambulatory Visit (INDEPENDENT_AMBULATORY_CARE_PROVIDER_SITE_OTHER): Payer: PPO | Admitting: Family Medicine

## 2015-01-27 ENCOUNTER — Encounter: Payer: Self-pay | Admitting: Family Medicine

## 2015-01-27 VITALS — BP 128/78 | Ht 64.0 in | Wt 205.0 lb

## 2015-01-27 DIAGNOSIS — E1342 Other specified diabetes mellitus with diabetic polyneuropathy: Secondary | ICD-10-CM | POA: Diagnosis not present

## 2015-01-27 DIAGNOSIS — I10 Essential (primary) hypertension: Secondary | ICD-10-CM | POA: Diagnosis not present

## 2015-01-27 DIAGNOSIS — L851 Acquired keratosis [keratoderma] palmaris et plantaris: Secondary | ICD-10-CM | POA: Diagnosis not present

## 2015-01-27 DIAGNOSIS — E785 Hyperlipidemia, unspecified: Secondary | ICD-10-CM

## 2015-01-27 DIAGNOSIS — E119 Type 2 diabetes mellitus without complications: Secondary | ICD-10-CM | POA: Diagnosis not present

## 2015-01-27 DIAGNOSIS — L609 Nail disorder, unspecified: Secondary | ICD-10-CM | POA: Diagnosis not present

## 2015-01-27 MED ORDER — FUROSEMIDE 40 MG PO TABS: 40.0000 mg | ORAL_TABLET | Freq: Every day | ORAL | Status: DC

## 2015-01-27 MED ORDER — HYDROCODONE-ACETAMINOPHEN 5-325 MG PO TABS: ORAL_TABLET | ORAL | Status: DC

## 2015-01-27 MED ORDER — METFORMIN HCL 500 MG PO TABS: ORAL_TABLET | ORAL | Status: DC

## 2015-01-27 MED ORDER — GABAPENTIN 100 MG PO CAPS: ORAL_CAPSULE | ORAL | Status: DC

## 2015-01-27 MED ORDER — LOSARTAN POTASSIUM 50 MG PO TABS: 50.0000 mg | ORAL_TABLET | Freq: Every day | ORAL | Status: DC

## 2015-01-27 NOTE — Progress Notes (Signed)
   Subjective:    Patient ID: [name], female    DOB: [birth year], 75 y.o.   MRN: [id number]  HPI This patient was seen today for chronic pain  The medication list was reviewed and updated.   -Compliance with medication: yes, takes as prescribed - Number patient states they take daily: Patient states takes between 1-2 tablets on most days. States does not take it every day.  -when was the last dose patient took? Last dose 01/25/15 at bed time.  The patient was advised the importance of maintaining medication and not using illegal substances with these.  Refills needed: yes  The patient was educated that we can provide 3 monthly scripts for their medication, it is their responsibility to follow the instructions.  Side effects or complications from medications: None  Patient is aware that pain medications are meant to minimize the severity of the pain to allow their pain levels to improve to allow for better function. They are aware of that pain medications cannot totally remove their pain.  Due for UDT ( at least once per year) : 07/22/2014  States no other concerns this visit.  Shoulder and low back and low leg pain, right knee deep ache . Has been advised she may need surgery. Not want to go through this now. States she definitely needs pain medicine. Stated that [name] has her regular pain medicine now she is doing very well with that. No obvious side effects.  Patient is on metformin for glucose intolerance. And most well. No obvious side effects. Does not miss a dose. Meds reviewed today.  Patient compliant blood pressure medication. Is watching salt intake. Generally blood pressure is good when checked elsewhere.     Review of Systems No current headache no chest pain no shortness breath no abdominal pain no change in bowel habits    Objective:   Physical Exam  Alert vital stable. Obesity present. Blood pressure good on repeat. HEENT normal. Lungs clear heart rare  rhythm. Knee positive crepitations bilateral shoulder positive impingement sign      Assessment & Plan:  Impression chronic shoulder and knee pain with chronic narcotic use. Handling well. #2 hypertension good control on current meds meds reviewed #3 hyperlipidemia recent status uncertain #4 glucose intolerance ongoing. Compliant with meds. Need to check A1c plan pain medicine refilled. Blood pressure in prediabetes medicine refilled. Further recommendations based on bloodwork [facility]  Addendum patient was declared is having type 2 diabetes by the nurse when filling out the A1c, she has glucose intolerance cannot take this way from diagnosis since already collated with a test order, will remove at next visit

## 2015-03-16 ENCOUNTER — Encounter: Payer: Self-pay | Admitting: Nurse Practitioner

## 2015-03-16 ENCOUNTER — Ambulatory Visit (INDEPENDENT_AMBULATORY_CARE_PROVIDER_SITE_OTHER): Payer: PPO | Admitting: Nurse Practitioner

## 2015-03-16 VITALS — Temp 98.1°F | Ht 64.0 in | Wt 202.6 lb

## 2015-03-16 DIAGNOSIS — R319 Hematuria, unspecified: Secondary | ICD-10-CM | POA: Diagnosis not present

## 2015-03-16 LAB — POCT URINALYSIS DIPSTICK
PH UA: 6
SPEC GRAV UA: 1.015

## 2015-03-16 MED ORDER — CEFDINIR 300 MG PO CAPS: 300.0000 mg | ORAL_CAPSULE | Freq: Two times a day (BID) | ORAL | Status: DC

## 2015-03-16 NOTE — Patient Instructions (Signed)
AZO Standard take as directed for 48 hours then stop (for bladder spasms)

## 2015-03-17 ENCOUNTER — Encounter: Payer: Self-pay | Admitting: Nurse Practitioner

## 2015-03-17 DIAGNOSIS — Z96651 Presence of right artificial knee joint: Secondary | ICD-10-CM | POA: Diagnosis not present

## 2015-03-17 DIAGNOSIS — Z96642 Presence of left artificial hip joint: Secondary | ICD-10-CM | POA: Diagnosis not present

## 2015-03-17 DIAGNOSIS — Z471 Aftercare following joint replacement surgery: Secondary | ICD-10-CM | POA: Diagnosis not present

## 2015-03-17 LAB — POCT UA - MICROSCOPIC ONLY

## 2015-03-17 NOTE — Progress Notes (Signed)
Subjective:   Presents for complaints of extreme frequency and urgency that began yesterday. Urge to void but usually having small amounts. No fever. No back or flank pain. No obvious blood in her urine. Some discomfort in the low left abdominal area at times. Patient took a pain pill earlier today which to help ease it off some. No nausea vomiting. No history of recent UTI. Married, same sexual partner.  Objective:   Temp(Src) 98.1 F (36.7 C) (Oral)  Ht 5\' 4"  (1.626 m)  Wt 202 lb 9.6 oz (91.899 kg)  BMI 34.76 kg/m2  NAD. Alert, oriented. Lungs clear. Heart regular rate rhythm. No CVA or flank tenderness. Abdomen soft nondistended with a small area of localized tenderness just left of the suprapubic area. Results for orders placed or performed in visit on 03/16/15  POCT urinalysis dipstick  Result Value Ref Range   Color, UA     Clarity, UA     Glucose, UA     Bilirubin, UA     Ketones, UA     Spec Grav, UA 1.015    Blood, UA trace    pH, UA 6.0    Protein, UA     Urobilinogen, UA     Nitrite, UA     Leukocytes, UA  Negative  POCT UA - Microscopic Only  Result Value Ref Range   WBC, Ur, HPF, POC rare    RBC, urine, microscopic 5+    Bacteria, U Microscopic rare    Mucus, UA     Epithelial cells, urine per micros rare    Crystals, Ur, HPF, POC     Casts, Ur, LPF, POC     Yeast, UA       Assessment: Hematuria - Plan: POCT urinalysis dipstick, Urine culture, POCT UA - Microscopic Only  Plan:  Meds ordered this encounter  Medications  . cefdinir (OMNICEF) 300 MG capsule    Sig: Take 1 capsule (300 mg total) by mouth 2 (two) times daily.    Dispense:  14 capsule    Refill:  0    Order Specific Question:  Supervising Provider    Answer:  Mikey Kirschner [[id number]]    Start antibiotics as directed. Call back in 4 days if no improvement, sooner if worse. Warning signs reviewed. AZO as directed for 48 hours then discontinue.

## 2015-03-18 LAB — URINE CULTURE

## 2015-04-05 DIAGNOSIS — E785 Hyperlipidemia, unspecified: Secondary | ICD-10-CM | POA: Diagnosis not present

## 2015-04-05 DIAGNOSIS — E119 Type 2 diabetes mellitus without complications: Secondary | ICD-10-CM | POA: Diagnosis not present

## 2015-04-05 DIAGNOSIS — I10 Essential (primary) hypertension: Secondary | ICD-10-CM | POA: Diagnosis not present

## 2015-04-06 LAB — BASIC METABOLIC PANEL
BUN / CREAT RATIO: 21 (ref 11–26)
BUN: 18 mg/dL (ref 8–27)
CHLORIDE: 102 mmol/L (ref 96–106)
CO2: 25 mmol/L (ref 18–29)
Calcium: 10 mg/dL (ref 8.7–10.3)
Creatinine, Ser: 0.85 mg/dL (ref 0.57–1.00)
GFR calc Af Amer: 78 mL/min/{1.73_m2} (ref >59)
GFR calc non Af Amer: 67 mL/min/{1.73_m2} (ref >59)
GLUCOSE: 105 mg/dL — AB (ref 65–99)
POTASSIUM: 4.7 mmol/L (ref 3.5–5.2)
SODIUM: 143 mmol/L (ref 134–144)

## 2015-04-06 LAB — HEPATIC FUNCTION PANEL
ALK PHOS: 77 IU/L (ref 39–117)
ALT: 22 IU/L (ref 0–32)
AST: 22 IU/L (ref 0–40)
Albumin: 4.6 g/dL (ref 3.5–4.8)
BILIRUBIN, DIRECT: 0.11 mg/dL (ref 0.00–0.40)
Bilirubin Total: 0.4 mg/dL (ref 0.0–1.2)
Total Protein: 7.3 g/dL (ref 6.0–8.5)

## 2015-04-06 LAB — LIPID PANEL
Chol/HDL Ratio: 3.4 ratio units (ref 0.0–4.4)
Cholesterol, Total: 174 mg/dL (ref 100–199)
HDL: 51 mg/dL (ref >39)
LDL Calculated: 98 mg/dL (ref 0–99)
TRIGLYCERIDES: 124 mg/dL (ref 0–149)
VLDL Cholesterol Cal: 25 mg/dL (ref 5–40)

## 2015-04-06 LAB — HEMOGLOBIN A1C
Est. average glucose Bld gHb Est-mCnc: 123 mg/dL
HEMOGLOBIN A1C: 5.9 % — AB (ref 4.8–5.6)

## 2015-04-07 DIAGNOSIS — L609 Nail disorder, unspecified: Secondary | ICD-10-CM | POA: Diagnosis not present

## 2015-04-07 DIAGNOSIS — E1342 Other specified diabetes mellitus with diabetic polyneuropathy: Secondary | ICD-10-CM | POA: Diagnosis not present

## 2015-04-07 DIAGNOSIS — L851 Acquired keratosis [keratoderma] palmaris et plantaris: Secondary | ICD-10-CM | POA: Diagnosis not present

## 2015-04-09 ENCOUNTER — Encounter: Payer: Self-pay | Admitting: Family Medicine

## 2015-04-25 DIAGNOSIS — L570 Actinic keratosis: Secondary | ICD-10-CM | POA: Diagnosis not present

## 2015-04-27 ENCOUNTER — Encounter: Payer: Self-pay | Admitting: Nurse Practitioner

## 2015-04-27 ENCOUNTER — Ambulatory Visit: Payer: PPO | Admitting: Nurse Practitioner

## 2015-04-27 ENCOUNTER — Ambulatory Visit (INDEPENDENT_AMBULATORY_CARE_PROVIDER_SITE_OTHER): Payer: PPO | Admitting: Nurse Practitioner

## 2015-04-27 VITALS — BP 114/76 | Ht 64.0 in | Wt 203.0 lb

## 2015-04-27 DIAGNOSIS — R7303 Prediabetes: Secondary | ICD-10-CM | POA: Diagnosis not present

## 2015-04-27 DIAGNOSIS — I10 Essential (primary) hypertension: Secondary | ICD-10-CM

## 2015-04-27 NOTE — Progress Notes (Signed)
Subjective:  Presents for routine follow-up of hypertension and prediabetes. Doing well with her diet. Regular walking program. After eating breakfast, patient goes for walk and has brief lower abdominal pain which resolves without intervention. Does not occur at any other time. No fever. No diarrhea or constipation. No blood in her stools. Stop taking her vitamin, seems to be slightly improved. Questioning whether she should stop her metformin. No chest pain/ischemic type pain or shortness of breath.  Objective:   BP 114/76 mmHg  Ht 5\' 4"  (1.626 m)  Wt 203 lb (92.08 kg)  BMI 34.83 kg/m2 NAD. Alert, oriented. Lungs clear. Heart regular rate rhythm. No change in her murmur. Reviewed lab work with patient from 04/05/2015.  Assessment:  Problem List Items Addressed This Visit      Cardiovascular and Mediastinum   Essential hypertension, benign (Chronic)     Other   Prediabetes - Primary (Chronic)     Plan: Continue healthy diet and regular activity. Hold on a.m. dose of metformin for 2 weeks to see if abdominal discomfort will resolve. If no change resume twice a day dosing of metformin. If resolved hold on a.m. Dose. Return in about 4 months (around 08/27/2015) for physical.

## 2015-05-12 ENCOUNTER — Other Ambulatory Visit: Payer: Self-pay | Admitting: Nurse Practitioner

## 2015-05-19 ENCOUNTER — Other Ambulatory Visit: Payer: Self-pay | Admitting: Family Medicine

## 2015-06-16 DIAGNOSIS — E1342 Other specified diabetes mellitus with diabetic polyneuropathy: Secondary | ICD-10-CM | POA: Diagnosis not present

## 2015-06-16 DIAGNOSIS — L851 Acquired keratosis [keratoderma] palmaris et plantaris: Secondary | ICD-10-CM | POA: Diagnosis not present

## 2015-06-16 DIAGNOSIS — L609 Nail disorder, unspecified: Secondary | ICD-10-CM | POA: Diagnosis not present

## 2015-06-28 ENCOUNTER — Encounter: Payer: Self-pay | Admitting: Family Medicine

## 2015-06-28 ENCOUNTER — Telehealth: Payer: Self-pay | Admitting: Internal Medicine

## 2015-06-28 ENCOUNTER — Ambulatory Visit (INDEPENDENT_AMBULATORY_CARE_PROVIDER_SITE_OTHER): Payer: PPO | Admitting: Family Medicine

## 2015-06-28 VITALS — BP 112/76 | Temp 98.8°F | Ht 64.0 in | Wt 208.0 lb

## 2015-06-28 DIAGNOSIS — R21 Rash and other nonspecific skin eruption: Secondary | ICD-10-CM

## 2015-06-28 NOTE — Telephone Encounter (Signed)
July RECALL FOR TCS °

## 2015-06-28 NOTE — Progress Notes (Signed)
   Subjective:    Patient ID: [name], female    DOB: 1940-09-27, 75 y.o.   MRN: [id number]  HPICyst on right hand. Came up about 3 months ago. Started itching and some pain about 2 weeks ago.   Mentioned to carolyn , had a small area and scar that occurred at a site of a bump  Recalls no injury  Started as a scaly area feels a bump under the skin.  Sees a dermatologist yearly due for next visit next April next  No bleeding  Review of Systems No headache, no major weight loss or weight gain, no chest pain no back pain abdominal pain no change in bowel habits complete ROS otherwise negative     Objective:   Physical Exam  Alert lungs clear. HEENT normal dorsal hand small nodule with slight hyperpigmented skin changes over it      Assessment & Plan:  Impression probable dermatofibroma or similar plan feel no need to go right to dermatologist. Warning signs discussed. If worsens recommend go to dermatologist [facility]

## 2015-06-28 NOTE — Telephone Encounter (Signed)
Letter mailed to pt.  

## 2015-07-03 ENCOUNTER — Telehealth: Payer: Self-pay | Admitting: Family Medicine

## 2015-07-03 ENCOUNTER — Other Ambulatory Visit: Payer: Self-pay | Admitting: *Deleted

## 2015-07-03 MED ORDER — HYDROCODONE-ACETAMINOPHEN 5-325 MG PO TABS: ORAL_TABLET | ORAL | Status: DC

## 2015-07-03 NOTE — Telephone Encounter (Signed)
Last check up 04/27/15. Got 2 scripts for hydrocodone on 01/27/15

## 2015-07-03 NOTE — Telephone Encounter (Signed)
Ok per dr Richardson Landry times one. Pt notified script ready for pickup.

## 2015-07-03 NOTE — Telephone Encounter (Signed)
Pt is requesting a refill on her HYDROcodone-acetaminophen (NORCO/VICODIN) 5-325 MG tablet.

## 2015-07-24 ENCOUNTER — Other Ambulatory Visit: Payer: Self-pay | Admitting: Family Medicine

## 2015-08-07 ENCOUNTER — Telehealth: Payer: Self-pay | Admitting: Family Medicine

## 2015-08-07 MED ORDER — HYDROCODONE-ACETAMINOPHEN 5-325 MG PO TABS: ORAL_TABLET | ORAL | Status: DC

## 2015-08-07 NOTE — Telephone Encounter (Signed)
Patient notified script ready for pick up

## 2015-08-07 NOTE — Telephone Encounter (Signed)
I would be fine with refilling this medicine but several things 1 need to verify when the prescription was last filled. She may have a prescription for 60 tablets 1 twice a day when necessary caution drowsiness finally make sure patient is having a regular follow-up office visit with Dr. Richardson Landry regarding pain management

## 2015-08-07 NOTE — Telephone Encounter (Signed)
Pt is requesting a refill on her HYDROcodone-acetaminophen (NORCO/VICODIN) 5-325 MG tablet. Pt is here with her husband right now if it is possible for her to get it while she is here.

## 2015-08-14 DIAGNOSIS — Z961 Presence of intraocular lens: Secondary | ICD-10-CM | POA: Diagnosis not present

## 2015-08-14 DIAGNOSIS — H43813 Vitreous degeneration, bilateral: Secondary | ICD-10-CM | POA: Diagnosis not present

## 2015-08-14 DIAGNOSIS — Z9842 Cataract extraction status, left eye: Secondary | ICD-10-CM | POA: Diagnosis not present

## 2015-08-14 DIAGNOSIS — H43812 Vitreous degeneration, left eye: Secondary | ICD-10-CM | POA: Diagnosis not present

## 2015-08-14 LAB — HM DIABETES EYE EXAM

## 2015-08-23 ENCOUNTER — Other Ambulatory Visit: Payer: Self-pay | Admitting: Nurse Practitioner

## 2015-08-25 ENCOUNTER — Ambulatory Visit (INDEPENDENT_AMBULATORY_CARE_PROVIDER_SITE_OTHER): Payer: PPO | Admitting: Nurse Practitioner

## 2015-08-25 ENCOUNTER — Encounter: Payer: Self-pay | Admitting: Nurse Practitioner

## 2015-08-25 VITALS — BP 124/80 | Ht 63.25 in | Wt 200.2 lb

## 2015-08-25 DIAGNOSIS — M8580 Other specified disorders of bone density and structure, unspecified site: Secondary | ICD-10-CM | POA: Diagnosis not present

## 2015-08-25 DIAGNOSIS — E2839 Other primary ovarian failure: Secondary | ICD-10-CM | POA: Diagnosis not present

## 2015-08-25 DIAGNOSIS — L609 Nail disorder, unspecified: Secondary | ICD-10-CM | POA: Diagnosis not present

## 2015-08-25 DIAGNOSIS — Z13820 Encounter for screening for osteoporosis: Secondary | ICD-10-CM

## 2015-08-25 DIAGNOSIS — Z780 Asymptomatic menopausal state: Secondary | ICD-10-CM | POA: Diagnosis not present

## 2015-08-25 DIAGNOSIS — L851 Acquired keratosis [keratoderma] palmaris et plantaris: Secondary | ICD-10-CM | POA: Diagnosis not present

## 2015-08-25 DIAGNOSIS — Z0000 Encounter for general adult medical examination without abnormal findings: Secondary | ICD-10-CM

## 2015-08-25 DIAGNOSIS — E1342 Other specified diabetes mellitus with diabetic polyneuropathy: Secondary | ICD-10-CM | POA: Diagnosis not present

## 2015-08-25 MED ORDER — ESTRADIOL 0.1 MG/GM VA CREA: TOPICAL_CREAM | VAGINAL | 12 refills | Status: DC

## 2015-08-26 ENCOUNTER — Encounter: Payer: Self-pay | Admitting: Nurse Practitioner

## 2015-08-26 NOTE — Progress Notes (Signed)
   Subjective:    Patient ID: [name], female    DOB: May 28, 1940, 75 y.o.   MRN: [id number]  HPI presents for her wellness exam. Doing well with diet. Regular walking for exercise. Same sexual partner. Has TAH with BSO. No pelvic pain. Some external irritation. No discharge. Regular vision exams. Needs dental exam. Regular skin cancer screening.     Review of Systems  Constitutional: Negative for activity change, appetite change and fatigue.  HENT: Negative for dental problem, ear pain, sinus pressure and sore throat.   Respiratory: Negative for cough, chest tightness, shortness of breath and wheezing.   Cardiovascular: Negative for chest pain.  Gastrointestinal: Negative for abdominal distention, abdominal pain, constipation, diarrhea, nausea and vomiting.  Genitourinary: Negative for difficulty urinating, dysuria, enuresis, frequency, genital sores, pelvic pain, urgency and vaginal discharge.       Objective:   Physical Exam  Constitutional: She is oriented to person, place, and time. She appears well-developed. No distress.  HENT:  Right Ear: External ear normal.  Left Ear: External ear normal.  Mouth/Throat: Oropharynx is clear and moist.  Neck: Normal range of motion. Neck supple. No tracheal deviation present. No thyromegaly present.  Cardiovascular: Normal rate, regular rhythm and normal heart sounds.  Exam reveals no gallop.   No murmur heard. Pulmonary/Chest: Effort normal and breath sounds normal.  Abdominal: Soft. She exhibits no distension. There is no tenderness.  Genitourinary: Vagina normal. No vaginal discharge found.  Genitourinary Comments: External GU: irritation with pale dry skin. No lesions.   Lymphadenopathy:    She has no cervical adenopathy.  Neurological: She is alert and oriented to person, place, and time.  Skin: Skin is warm and dry.  Psychiatric: She has a normal mood and affect. Her behavior is normal.  Vitals reviewed. Breast exam: no  masses; axillae no adenopathy.        Assessment & Plan:   Problem List Items Addressed This Visit      Musculoskeletal and Integument   Osteopenia     Other   Hypoestrogenism    Other Visit Diagnoses    Routine general medical examination at a health care facility    -  Primary   Screening for osteoporosis       Post-menopausal       Relevant Orders   DG Bone Density     Meds ordered this encounter  Medications  . estradiol (ESTRACE VAGINAL) 0.1 MG/GM vaginal cream    Sig: Apply externally to the genital area qhs up to 2 weeks prn    Dispense:  42.5 g    Refill:  12    Order Specific Question:   Supervising Provider    Answer:   Mikey Kirschner [[id number]]    Defers mammogram. Recommend daily vitamin D and calcium.  Return in about 3 months (around 11/25/2015).

## 2015-08-31 ENCOUNTER — Ambulatory Visit (HOSPITAL_COMMUNITY)
Admission: RE | Admit: 2015-08-31 | Discharge: 2015-08-31 | Disposition: A | Discharge status: Home / Self Care | Payer: PPO | Source: Ambulatory Visit | Attending: Nurse Practitioner | Admitting: Nurse Practitioner

## 2015-08-31 DIAGNOSIS — M8588 Other specified disorders of bone density and structure, other site: Secondary | ICD-10-CM | POA: Diagnosis not present

## 2015-08-31 DIAGNOSIS — M85851 Other specified disorders of bone density and structure, right thigh: Secondary | ICD-10-CM | POA: Diagnosis not present

## 2015-08-31 DIAGNOSIS — Z780 Asymptomatic menopausal state: Secondary | ICD-10-CM | POA: Insufficient documentation

## 2015-09-01 ENCOUNTER — Other Ambulatory Visit: Payer: Self-pay | Admitting: Family Medicine

## 2015-09-18 ENCOUNTER — Telehealth: Payer: Self-pay | Admitting: Family Medicine

## 2015-09-18 MED ORDER — HYDROCODONE-ACETAMINOPHEN 5-325 MG PO TABS: ORAL_TABLET | ORAL | 0 refills | Status: DC

## 2015-09-18 NOTE — Telephone Encounter (Signed)
Prescription up front for patient pick up. Patient notified.

## 2015-09-18 NOTE — Telephone Encounter (Signed)
Pt is requesting a refill on her HYDROcodone-acetaminophen.

## 2015-09-18 NOTE — Telephone Encounter (Signed)
Last fill date was 7/17

## 2015-09-18 NOTE — Telephone Encounter (Signed)
Ok times one, tell pt if she cont to gte monthly will need to get on a strict regimen of pain med visiwts here every three months

## 2015-09-19 ENCOUNTER — Other Ambulatory Visit: Payer: Self-pay | Admitting: Family Medicine

## 2015-09-27 ENCOUNTER — Encounter: Payer: Self-pay | Admitting: *Deleted

## 2015-10-10 DIAGNOSIS — M79672 Pain in left foot: Secondary | ICD-10-CM | POA: Diagnosis not present

## 2015-10-10 DIAGNOSIS — Q828 Other specified congenital malformations of skin: Secondary | ICD-10-CM | POA: Diagnosis not present

## 2015-10-27 ENCOUNTER — Other Ambulatory Visit: Payer: Self-pay | Admitting: *Deleted

## 2015-10-27 ENCOUNTER — Telehealth: Payer: Self-pay | Admitting: Family Medicine

## 2015-10-27 ENCOUNTER — Telehealth: Payer: Self-pay | Admitting: *Deleted

## 2015-10-27 ENCOUNTER — Other Ambulatory Visit: Payer: Self-pay | Admitting: Family Medicine

## 2015-10-27 MED ORDER — HYDROCODONE-ACETAMINOPHEN 5-325 MG PO TABS: ORAL_TABLET | ORAL | 0 refills | Status: DC

## 2015-10-27 NOTE — Telephone Encounter (Signed)
Pt wants to pick today. Pt states last time she only got one script.

## 2015-10-27 NOTE — Telephone Encounter (Signed)
See previous message. Pt came in office today with script dated for 10/26/14. Over one year old. Pt took to pharm and they would not fill. Consult with carolyn. New script given to pt to fill today

## 2015-10-27 NOTE — Telephone Encounter (Signed)
Requesting Rx for HYDROcodone-acetaminophen (NORCO/VICODIN) 5-325 MG tablet ° °

## 2015-10-27 NOTE — Telephone Encounter (Signed)
Pt called back and stated she found a script that has not been filled and does not need refill at this time. Will get next one at her office visit in November.

## 2015-10-27 NOTE — Telephone Encounter (Signed)
Left message to return call to see if pt needed this before Monday since dr Richardson Landry is out of office.

## 2015-11-03 DIAGNOSIS — L609 Nail disorder, unspecified: Secondary | ICD-10-CM | POA: Diagnosis not present

## 2015-11-03 DIAGNOSIS — L851 Acquired keratosis [keratoderma] palmaris et plantaris: Secondary | ICD-10-CM | POA: Diagnosis not present

## 2015-11-03 DIAGNOSIS — E1342 Other specified diabetes mellitus with diabetic polyneuropathy: Secondary | ICD-10-CM | POA: Diagnosis not present

## 2015-11-24 ENCOUNTER — Ambulatory Visit: Payer: PPO | Admitting: Family Medicine

## 2015-11-27 ENCOUNTER — Ambulatory Visit (INDEPENDENT_AMBULATORY_CARE_PROVIDER_SITE_OTHER): Payer: PPO | Admitting: Family Medicine

## 2015-11-27 ENCOUNTER — Encounter: Payer: Self-pay | Admitting: Family Medicine

## 2015-11-27 VITALS — BP 120/80 | Ht 64.0 in | Wt 198.1 lb

## 2015-11-27 DIAGNOSIS — R7303 Prediabetes: Secondary | ICD-10-CM

## 2015-11-27 DIAGNOSIS — I10 Essential (primary) hypertension: Secondary | ICD-10-CM | POA: Diagnosis not present

## 2015-11-27 DIAGNOSIS — M159 Polyosteoarthritis, unspecified: Secondary | ICD-10-CM

## 2015-11-27 LAB — POCT GLYCOSYLATED HEMOGLOBIN (HGB A1C): Hemoglobin A1C: 5.4

## 2015-11-27 MED ORDER — POTASSIUM CHLORIDE CRYS ER 20 MEQ PO TBCR: 20.0000 meq | EXTENDED_RELEASE_TABLET | Freq: Every day | ORAL | 3 refills | Status: DC

## 2015-11-27 MED ORDER — HYDROCODONE-ACETAMINOPHEN 5-325 MG PO TABS: ORAL_TABLET | ORAL | 0 refills | Status: DC

## 2015-11-27 MED ORDER — OMEPRAZOLE 20 MG PO CPDR: 20.0000 mg | DELAYED_RELEASE_CAPSULE | Freq: Every day | ORAL | 11 refills | Status: DC

## 2015-11-27 NOTE — Progress Notes (Signed)
   Subjective:    Patient ID: [name], female    DOB: [birth year], 75 y.o.   MRN: [id number]  HPI This patient was seen today for chronic pain  The medication list was reviewed and updated.   -Compliance with medication: Yes takes as prescribed.  - Number patient states they take daily: Takes 2 tablets daily  -when was the last dose patient took? This morning   The patient was advised the importance of maintaining medication and not using illegal substances with these.  Refills needed: yes   The patient was educated that we can provide 3 monthly scripts for their medication, it is their responsibility to follow the instructions.  Side effects or complications from medications: None   Patient is aware that pain medications are meant to minimize the severity of the pain to allow their pain levels to improve to allow for better function. They are aware of that pain medications cannot totally remove their pain.  Due for UDT ( at least once per year) :   Pain not necessarily bad vt defineitely   Not wanting flu shot today  Feet burning with exercise but pt trying to,  Pt often gets a sour stomach and just does not have ususal appetite, Some postprandial queasiness and mild discomfort upper mid abdomen. No severe pain    la   Results for orders placed or performed in visit on 11/27/15  POCT HgB A1C  Result Value Ref Range   Hemoglobin A1C 5.4       Review of Systems No headache, no major weight loss or weight gain, no chest pain no back pain abdominal pain no change in bowel habits complete ROS otherwise negative     Objective:   Physical Exam Alert vitals stable, NAD. Blood pressure good on repeat. HEENT normal. Lungs clear. Heart regular rate and rhythm. Positive pain percussion low back. Negative straight leg raise.       Assessment & Plan:  Impression 1 hypertension good control discussed maintain same meds #2 prediabetes discuss A1c stable continue  diet #3 chronic pain ongoing. Compliant with hydrocodone. Patient takes a couple per day. Notes improved control when she takes it #4 reflux-like symptomatology discussed trial of omeprazole before further workup. Last colonoscopy 7 years ago WSL

## 2015-12-15 ENCOUNTER — Other Ambulatory Visit: Payer: Self-pay | Admitting: Family Medicine

## 2015-12-21 DIAGNOSIS — Z96651 Presence of right artificial knee joint: Secondary | ICD-10-CM | POA: Diagnosis not present

## 2015-12-21 DIAGNOSIS — M25561 Pain in right knee: Secondary | ICD-10-CM | POA: Diagnosis not present

## 2016-01-16 ENCOUNTER — Encounter: Payer: Self-pay | Admitting: Family Medicine

## 2016-01-16 ENCOUNTER — Ambulatory Visit (INDEPENDENT_AMBULATORY_CARE_PROVIDER_SITE_OTHER): Payer: PPO | Admitting: Family Medicine

## 2016-01-16 VITALS — BP 122/76 | Temp 98.2°F | Ht 64.0 in | Wt 202.4 lb

## 2016-01-16 DIAGNOSIS — J329 Chronic sinusitis, unspecified: Secondary | ICD-10-CM

## 2016-01-16 MED ORDER — AMOXICILLIN-POT CLAVULANATE 875-125 MG PO TABS
1.0000 | ORAL_TABLET | Freq: Two times a day (BID) | ORAL | 0 refills | Status: AC
Start: 2016-01-16 — End: 2016-01-30

## 2016-01-16 NOTE — Progress Notes (Signed)
   Subjective:    Patient ID: [name], female    DOB: 12/15/1940, 75 y.o.   MRN: [id number]  Sinusitis  This is a new problem. The current episode started in the past 7 days. The problem is unchanged. There has been no fever. The pain is moderate. Associated symptoms include congestion, coughing, headaches and a sore throat. Past treatments include nothing. The treatment provided no relief.   Patient states that she has no other concerns at this time.    started up a week ago with cogh and cong and dranage  ytjoat burning and runny nose  Then progressed into frontal hedache  And cough  Now prod yellow gunk,  Dim energy  bilat    Review of Systems  HENT: Positive for congestion and sore throat.   Respiratory: Positive for cough.   Neurological: Positive for headaches.       Objective:   Physical Exam Alert, mild malaise. Hydration good Vitals stable. frontal/ maxillary tenderness evident positive nasal congestion. pharynx normal neck supple  lungs clear/no crackles or wheezes. heart regular in rhythm        Assessment & Plan:  Impression rhinosinusitis likely post viral, discussed with patient. plan antibiotics prescribed. Questions answered. Symptomatic care discussed. warning signs discussed. [facility]

## 2016-01-19 DIAGNOSIS — L609 Nail disorder, unspecified: Secondary | ICD-10-CM | POA: Diagnosis not present

## 2016-01-19 DIAGNOSIS — L851 Acquired keratosis [keratoderma] palmaris et plantaris: Secondary | ICD-10-CM | POA: Diagnosis not present

## 2016-01-19 DIAGNOSIS — E1342 Other specified diabetes mellitus with diabetic polyneuropathy: Secondary | ICD-10-CM | POA: Diagnosis not present

## 2016-02-09 ENCOUNTER — Ambulatory Visit (INDEPENDENT_AMBULATORY_CARE_PROVIDER_SITE_OTHER): Payer: PPO | Admitting: Nurse Practitioner

## 2016-02-09 ENCOUNTER — Encounter: Payer: Self-pay | Admitting: Nurse Practitioner

## 2016-02-09 VITALS — BP 130/80 | Ht 64.0 in | Wt 202.0 lb

## 2016-02-09 DIAGNOSIS — Z23 Encounter for immunization: Secondary | ICD-10-CM | POA: Diagnosis not present

## 2016-02-09 DIAGNOSIS — N9089 Other specified noninflammatory disorders of vulva and perineum: Secondary | ICD-10-CM | POA: Diagnosis not present

## 2016-02-09 MED ORDER — KETOCONAZOLE 2 % EX CREA: 1.0000 "application " | TOPICAL_CREAM | Freq: Two times a day (BID) | CUTANEOUS | 0 refills | Status: DC

## 2016-02-09 NOTE — Patient Instructions (Signed)
Hydrocortisone cream twice a day as needed

## 2016-02-10 ENCOUNTER — Encounter: Payer: Self-pay | Admitting: Nurse Practitioner

## 2016-02-10 DIAGNOSIS — N9089 Other specified noninflammatory disorders of vulva and perineum: Secondary | ICD-10-CM | POA: Insufficient documentation

## 2016-02-10 NOTE — Progress Notes (Signed)
Subjective:  Presents for c/o a lesion on the right labia that came up recently. Area very pruritic. Occasional burning and tenderness. Married, same sexual partner. Was seen for PE on 8/4; had some external labial irritation and itching at that time. Tried Estrogen cream with no improvement so she stopped this. Has also tried OTC creams with minimal relief.   Objective:   BP 130/80   Ht 5\' 4"  (1.626 m)   Wt 202 lb (91.6 kg)   BMI 34.67 kg/m  NAD. Alert, oriented. Raised pale verrucous appearing lesion noted mid left labia approx 0.5-1 cm in size. Multiple small ecchymotic areas noted on both labia most likely from forceful rubbing due to itching. Only one very small area or irritation noted near introitus. A tiny slightly raised area noted on the right labia.  Assessment:   Problem List Items Addressed This Visit      Other   Labial lesion - Primary    Other Visit Diagnoses    Need for vaccination       Relevant Orders   Flu Vaccine QUAD 36+ mos PF IM (Fluarix & Fluzone Quad PF) (Completed)       Plan:  Meds ordered this encounter  Medications  . ketoconazole (NIZORAL) 2 % cream    Sig: Apply 1 application topically 2 (two) times daily.    Dispense:  30 g    Refill:  0    Order Specific Question:   Supervising Provider    Answer:   Maggie Font   Refer to GYN for evaluation. In the meantime, use HC 1% mixed with ketoconazole externally for irritation and itching.

## 2016-02-15 DIAGNOSIS — Z0000 Encounter for general adult medical examination without abnormal findings: Secondary | ICD-10-CM | POA: Diagnosis not present

## 2016-02-20 ENCOUNTER — Ambulatory Visit (INDEPENDENT_AMBULATORY_CARE_PROVIDER_SITE_OTHER): Payer: PPO | Admitting: Obstetrics & Gynecology

## 2016-02-20 ENCOUNTER — Encounter: Payer: Self-pay | Admitting: Obstetrics & Gynecology

## 2016-02-20 VITALS — BP 120/80 | HR 74 | Ht 63.0 in | Wt 198.0 lb

## 2016-02-20 DIAGNOSIS — C519 Malignant neoplasm of vulva, unspecified: Secondary | ICD-10-CM | POA: Diagnosis not present

## 2016-02-20 DIAGNOSIS — D071 Carcinoma in situ of vulva: Secondary | ICD-10-CM | POA: Diagnosis not present

## 2016-02-20 NOTE — Progress Notes (Signed)
Chief Complaint  Patient presents with  . Referral    labial lesion    Blood pressure 120/80, pulse 74, height 5\' 3"  (1.6 m), weight 198 lb (89.8 kg).  76 y.o. No obstetric history on file. No LMP recorded. Patient has had a hysterectomy. The current method of family planning is status post hysterectomy.  Outpatient Encounter Prescriptions as of 02/20/2016  Medication Sig Note  . aspirin 81 MG tablet Take 81 mg by mouth daily.   Marland Kitchen CINNAMON PO Take 2 capsules by mouth daily. 2,000 mg daily    . furosemide (LASIX) 40 MG tablet TAKE ONE TABLET BY MOUTH ONCE DAILY.   Marland Kitchen gabapentin (NEURONTIN) 100 MG capsule TAKE 1 CAPSULE BY MOUTH THREE TIMES A DAY. (Patient taking differently: TAKE 1 CAPSULE BY MOUTH TWO TIMES A DAY.)   . HYDROcodone-acetaminophen (NORCO/VICODIN) 5-325 MG tablet TAKE (1) TABLET BY MOUTH TWICE A DAY AS NEEDED.   Marland Kitchen ketoconazole (NIZORAL) 2 % cream Apply 1 application topically 2 (two) times daily. 02/22/2016: For after procedure  . losartan (COZAAR) 50 MG tablet TAKE ONE TABLET BY MOUTH ONCE DAILY.   . metFORMIN (GLUCOPHAGE) 500 MG tablet TAKE (1) TABLET BY MOUTH TWICE DAILY. (Patient taking differently: Take 500 mg by mouth 2 (two) times daily with a meal. TAKE (1) TABLET BY MOUTH TWICE DAILY.)   . potassium chloride SA (K-DUR,KLOR-CON) 20 MEQ tablet Take 1 tablet (20 mEq total) by mouth daily.   . [DISCONTINUED] meloxicam (MOBIC) 15 MG tablet Take 1 tablet (15 mg total) by mouth daily.   . [DISCONTINUED] triamcinolone cream (KENALOG) 0.1 % Apply 1 application topically 2 (two) times daily as needed (rash).   . [DISCONTINUED] Docusate Sodium (COLACE PO) Take by mouth.   . [DISCONTINUED] omeprazole (PRILOSEC) 20 MG capsule Take 1 capsule (20 mg total) by mouth daily.    No facility-administered encounter medications on file as of 02/20/2016.     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 Primary vulvar cancer ([place]) 184.4 C51.9    Right vulva, pt states has been present since about October  2. 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   Labs or Scans Ordered: No orders of the defined types were placed in this encounter.   Management:: Appointment made with Dr Roanna Raider Oncology, Friday March 01, 2016 @1030  Pt aware Will not biospy today, will let dr Denman George what setting to do that in Follow up Return if symptoms worsen or fail to improve.        Face to face time:  20 minutes  Greater than 50% of the visit time was spent in counseling and coordination of care with the patient.  The summary and outline of the counseling and care coordination is summarized in the note above.   All questions were answered.  Past Medical History:  Diagnosis Date  . Aortic stenosis, mild   . Aortic valve stenosis, mild   . Arthritis   . Cancer ([employer])    vulvar   . Complication of anesthesia   .  Diabetes mellitus without complication ([place])    type 2  . Heart murmur   . History of blood transfusion age 63 or 90 months old  . Hypertension   . IFG (impaired fasting glucose)   . PONV (postoperative nausea and vomiting)    years ago with back surgery-nausea  . Vitamin D deficiency     Past Surgical History:  Procedure Laterality Date  . ABDOMINAL HYSTERECTOMY     complete  . BACK SURGERY     upper  with plate frontal fusion and lower x 2  . COLONOSCOPY    . KNEE SURGERY Left    arthroscopy  . TOE SURGERY Left   . TONSILLECTOMY    . TOTAL HIP ARTHROPLASTY Left 10/27/2013   Procedure: LEFT TOTAL HIP ARTHROPLASTY;  Surgeon: Tobi Bastos, MD;  Location: [facility];  Service: Orthopedics;  Laterality: Left;  . TOTAL KNEE ARTHROPLASTY Right   . VULVA /PERINEUM BIOPSY  02/22/2016  . VULVECTOMY Right 02/27/2016   Procedure: RADICAL RIGHT VULVECTOMY WITH RIGHT INGUINAL LYMPH NODE DISSECTION;  Surgeon: Nancy Marus, MD;  Location: [facility];  Service: Gynecology;  Laterality: Right;    OB History    No data available      Allergies  Allergen Reactions  . Celebrex [Celecoxib]     GI upset   . Doxycycline Nausea Only    Gi upset  . Lodine [Etodolac] Nausea Only    Gi upset  . Nabumetone     (relafen)  . Tetracycline   . Xarelto [Rivaroxaban] Hives    Social History   Social History  . Marital status: Married    Spouse name: N/A  . Number of children: N/A  . Years of education: N/A   Social History Main Topics  . Smoking status: Never Smoker  . Smokeless tobacco: Never Used  . Alcohol use No  . Drug use: No  . Sexual activity: Not Asked   Other Topics Concern  . None   Social History Narrative  . None    Family History  Problem Relation Age of Onset  . Hypertension Mother   . Hypertension Father   . Cancer Sister     Breast  . Hypertension Brother

## 2016-02-22 ENCOUNTER — Encounter: Payer: Self-pay | Admitting: Gynecologic Oncology

## 2016-02-22 ENCOUNTER — Telehealth: Payer: Self-pay

## 2016-02-22 ENCOUNTER — Other Ambulatory Visit (HOSPITAL_BASED_OUTPATIENT_CLINIC_OR_DEPARTMENT_OTHER): Payer: PPO

## 2016-02-22 ENCOUNTER — Ambulatory Visit: Payer: PPO | Attending: Gynecologic Oncology | Admitting: Gynecologic Oncology

## 2016-02-22 VITALS — BP 119/88 | HR 88 | Temp 98.2°F | Resp 16 | Ht 63.0 in | Wt 196.2 lb

## 2016-02-22 DIAGNOSIS — E559 Vitamin D deficiency, unspecified: Secondary | ICD-10-CM | POA: Diagnosis not present

## 2016-02-22 DIAGNOSIS — C519 Malignant neoplasm of vulva, unspecified: Secondary | ICD-10-CM | POA: Diagnosis not present

## 2016-02-22 DIAGNOSIS — N9089 Other specified noninflammatory disorders of vulva and perineum: Secondary | ICD-10-CM | POA: Diagnosis not present

## 2016-02-22 DIAGNOSIS — Z7984 Long term (current) use of oral hypoglycemic drugs: Secondary | ICD-10-CM | POA: Diagnosis not present

## 2016-02-22 DIAGNOSIS — Z90710 Acquired absence of both cervix and uterus: Secondary | ICD-10-CM | POA: Insufficient documentation

## 2016-02-22 DIAGNOSIS — Z888 Allergy status to other drugs, medicaments and biological substances status: Secondary | ICD-10-CM | POA: Insufficient documentation

## 2016-02-22 DIAGNOSIS — Z96651 Presence of right artificial knee joint: Secondary | ICD-10-CM | POA: Diagnosis not present

## 2016-02-22 DIAGNOSIS — Z803 Family history of malignant neoplasm of breast: Secondary | ICD-10-CM | POA: Diagnosis not present

## 2016-02-22 DIAGNOSIS — Z7982 Long term (current) use of aspirin: Secondary | ICD-10-CM | POA: Insufficient documentation

## 2016-02-22 DIAGNOSIS — I10 Essential (primary) hypertension: Secondary | ICD-10-CM | POA: Insufficient documentation

## 2016-02-22 DIAGNOSIS — I350 Nonrheumatic aortic (valve) stenosis: Secondary | ICD-10-CM | POA: Diagnosis not present

## 2016-02-22 DIAGNOSIS — Z96642 Presence of left artificial hip joint: Secondary | ICD-10-CM | POA: Diagnosis not present

## 2016-02-22 DIAGNOSIS — Z8249 Family history of ischemic heart disease and other diseases of the circulatory system: Secondary | ICD-10-CM | POA: Insufficient documentation

## 2016-02-22 HISTORY — PX: VULVA /PERINEUM BIOPSY: SHX319

## 2016-02-22 LAB — COMPREHENSIVE METABOLIC PANEL
ALBUMIN: 3.8 g/dL (ref 3.5–5.0)
ALT: 26 U/L (ref 0–55)
AST: 24 U/L (ref 5–34)
Alkaline Phosphatase: 76 U/L (ref 40–150)
Anion Gap: 12 mEq/L — ABNORMAL HIGH (ref 3–11)
BUN: 22 mg/dL (ref 7.0–26.0)
CHLORIDE: 105 meq/L (ref 98–109)
CO2: 26 meq/L (ref 22–29)
Calcium: 9.9 mg/dL (ref 8.4–10.4)
Creatinine: 0.8 mg/dL (ref 0.6–1.1)
EGFR: 70 mL/min/{1.73_m2} — AB (ref >90)
GLUCOSE: 102 mg/dL (ref 70–140)
POTASSIUM: 3.9 meq/L (ref 3.5–5.1)
SODIUM: 143 meq/L (ref 136–145)
Total Bilirubin: 0.3 mg/dL (ref 0.20–1.20)
Total Protein: 7.1 g/dL (ref 6.4–8.3)

## 2016-02-22 NOTE — Patient Instructions (Signed)
AN DOZER  02/22/2016   Your procedure is scheduled on: 02-27-16  Report to [facility] Main  Entrance take [facility]  elevators to 3rd floor to  [place] at 1115 AM.  Call this number if you have problems the morning of surgery [phone]   Remember: ONLY 1 PERSON MAY GO WITH YOU TO SHORT STAY TO GET  READY MORNING OF [place].  Do not eat food  :After Midnight MONDAY NIGHT, LIGHT DIET DAY BEFORE SURGERY, SEE INSTRUCTIONS BELOW, MAY HAVE CLEAR LIQUIDS FROM MIDNIGHT Monday NIGHT UNTIL 715 AM DAY OF SURGERY 02-27-16, NOTHING BY MOUTH AFTER 715 AM DAY OF SURGERY.      Take these medicines the morning of surgery with A SIP OF WATER: GABAPENTIN (NEURONTIN), HYDROCODONE IF NEEDED DO NOT TAKE ANY DIABETIC MEDICATIONS DAY OF YOUR SURGERY                               You may not have any metal on your body including hair pins and              piercings  Do not wear jewelry, make-up, lotions, powders or perfumes, deodorant             Do not wear nail polish.  Do not shave  48 hours prior to surgery.              Men may shave face and neck.   Do not bring valuables to the hospital. [place].  Contacts, dentures or bridgework may not be worn into surgery.  Leave suitcase in the car. After surgery it may be brought to your room.     ____             How to Manage Your Diabetes Before and After Surgery  Why is it important to control my blood sugar before and after surgery? . Improving blood sugar levels before and after surgery helps healing and can limit problems. . A way of improving blood sugar control is eating a healthy diet by: o  Eating less sugar and carbohydrates o  Increasing activity/exercise o  Talking with your doctor about reaching your blood sugar goals . High blood sugars (greater than 180 mg/dL) can raise your risk of infections and slow your recovery, so you will need to focus on  controlling your diabetes during the weeks before surgery. . Make sure that the doctor who takes care of your diabetes knows about your planned surgery including the date and location.  How do I manage my blood sugar before surgery? . Check your blood sugar at least 4 times a day, starting 2 days before surgery, to make sure that the level is not too high or low. o Check your blood sugar the morning of your surgery when you wake up and every 2 hours until you get to the Short Stay unit. . If your blood sugar is less than 70 mg/dL, you will need to treat for low blood sugar: o Do not take insulin. o Treat a low blood sugar (less than 70 mg/dL) with  cup of clear juice (cranberry or apple), 4 glucose tablets, OR glucose gel. o Recheck blood sugar in 15 minutes after treatment (to  make sure it is greater than 70 mg/dL). If your blood sugar is not greater than 70 mg/dL on recheck, call [phone] for further instructions. . Report your blood sugar to the short stay nurse when you get to Short Stay.  . If you are admitted to the hospital after surgery: o Your blood sugar will be checked by the staff and you will probably be given insulin after surgery (instead of oral diabetes medicines) to make sure you have good blood sugar levels. o The goal for blood sugar control after surgery is 80-180 mg/dL.   WHAT DO I DO ABOUT MY DIABETES MEDICATION?  YOU MAY TAKE YOUR METFORMIN DAY BEFORE SURGERY ON 02-26-16 DO NOT TAKE YOUR METFORMIN ON DAY OF SURGERY 2-6 18    Patient Signature:  Date:   Nurse Signature:  Date:   Reviewed and Endorsed by [facility] Patient Education Committee, August 2015  Eat a light diet the day before surgery.  Examples including soups, broths, toast, yogurt, mashed potatoes.  Things to avoid include carbonated beverages (fizzy beverages), raw fruits and raw vegetables, or beans.   If your bowels are filled with gas, your surgeon will have difficulty visualizing your pelvic  organs which increases your surgical risks.  CLEAR LIQUID DIET   Foods Allowed                                                                     Foods Excluded  Coffee and tea, regular and decaf                             liquids that you cannot  Plain Jell-O in any flavor                                             see through such as: Fruit ices (not with fruit pulp)                                     milk, soups, orange juice  Iced Popsicles                                    All solid food Carbonated beverages, regular and diet                                    Cranberry, grape and apple juices Sports drinks like Gatorade Lightly seasoned clear broth or consume(fat free) Sugar, honey syrup  Sample Menu Breakfast                                Lunch  Supper Cranberry juice                    Beef broth                            Chicken broth Jell-O                                     Grape juice                           Apple juice Coffee or tea                        Jell-O                                      Popsicle                                                Coffee or tea                        Coffee or tea  _____________________________________________________________________  [facility] - Preparing for Surgery Before surgery, you can play an important role.  Because skin is not sterile, your skin needs to be as free of germs as possible.  You can reduce the number of germs on your skin by washing with CHG (chlorahexidine gluconate) soap before surgery.  CHG is an antiseptic cleaner which kills germs and bonds with the skin to continue killing germs even after washing. Please DO NOT use if you have an allergy to CHG or antibacterial soaps.  If your skin becomes reddened/irritated stop using the CHG and inform your nurse when you arrive at Short Stay. Do not shave (including legs and underarms) for at least 48 hours prior to the  first CHG shower.  You may shave your face/neck. Please follow these instructions carefully:  1.  Shower with CHG Soap the night before surgery and the  morning of Surgery.  2.  If you choose to wash your hair, wash your hair first as usual with your  normal  shampoo.  3.  After you shampoo, rinse your hair and body thoroughly to remove the  shampoo.                           4.  Use CHG as you would any other liquid soap.  You can apply chg directly  to the skin and wash                       Gently with a scrungie or clean washcloth.  5.  Apply the CHG Soap to your body ONLY FROM THE NECK DOWN.   Do not use on face/ open                           Wound or open sores. Avoid contact with eyes, ears mouth and genitals (private parts).  Wash face,  Genitals (private parts) with your normal soap.             6.  Wash thoroughly, paying special attention to the area where your surgery  will be performed.  7.  Thoroughly rinse your body with warm water from the neck down.  8.  DO NOT shower/wash with your normal soap after using and rinsing off  the CHG Soap.                9.  Pat yourself dry with a clean towel.            10.  Wear clean pajamas.            11.  Place clean sheets on your bed the night of your first shower and do not  sleep with pets. Day of Surgery : Do not apply any lotions/deodorants the morning of surgery.  Please wear clean clothes to the hospital/surgery center.    Incentive Spirometer  An incentive spirometer is a tool that can help keep your lungs clear and active. This tool measures how well you are filling your lungs with each breath. Taking long deep breaths may help reverse or decrease the chance of developing breathing (pulmonary) problems (especially infection) following:  A long period of time when you are unable to move or be active. BEFORE THE PROCEDURE   If the spirometer includes an indicator to show your best effort, your nurse or  respiratory therapist will set it to a desired goal.  If possible, sit up straight or lean slightly forward. Try not to slouch.  Hold the incentive spirometer in an upright position. INSTRUCTIONS FOR USE  1. Sit on the edge of your bed if possible, or sit up as far as you can in bed or on a chair. 2. Hold the incentive spirometer in an upright position. 3. Breathe out normally. 4. Place the mouthpiece in your mouth and seal your lips tightly around it. 5. Breathe in slowly and as deeply as possible, raising the piston or the ball toward the top of the column. 6. Hold your breath for 3-5 seconds or for as long as possible. Allow the piston or ball to fall to the bottom of the column. 7. Remove the mouthpiece from your mouth and breathe out normally. 8. Rest for a few seconds and repeat Steps 1 through 7 at least 10 times every 1-2 hours when you are awake. Take your time and take a few normal breaths between deep breaths. 9. The spirometer may include an indicator to show your best effort. Use the indicator as a goal to work toward during each repetition. 10. After each set of 10 deep breaths, practice coughing to be sure your lungs are clear. If you have an incision (the cut made at the time of surgery), support your incision when coughing by placing a pillow or rolled up towels firmly against it. Once you are able to get out of bed, walk around indoors and cough well. You may stop using the incentive spirometer when instructed by your caregiver.  RISKS AND COMPLICATIONS  Take your time so you do not get dizzy or light-headed.  If you are in pain, you may need to take or ask for pain medication before doing incentive spirometry. It is harder to take a deep breath if you are having pain. AFTER USE  Rest and breathe slowly and easily.  It can be helpful to keep track of a log  of your progress. Your caregiver can provide you with a simple table to help with this. If you are using the  spirometer at home, follow these instructions: [place] IF:   You are having difficultly using the spirometer.  You have trouble using the spirometer as often as instructed.  Your pain medication is not giving enough relief while using the spirometer.  You develop fever of 100.5 F (38.1 C) or higher. SEEK IMMEDIATE MEDICAL CARE IF:   You cough up bloody sputum that had not been present before.  You develop fever of 102 F (38.9 C) or greater.  You develop worsening pain at or near the incision site. MAKE SURE YOU:   Understand these instructions.  Will watch your condition.  Will get help right away if you are not doing well or get worse. Document Released: 05/20/2006 Document Revised: 04/01/2011 Document Reviewed: 07/21/2006 Exit[employer] Patient Information 2014 [employer], [place].   ________________________________________________________________________  WHAT IS A BLOOD TRANSFUSION? Blood Transfusion Information  A transfusion is the replacement of blood or some of its parts. Blood is made up of multiple cells which provide different functions.  Red blood cells carry oxygen and are used for blood loss replacement.  White blood cells fight against infection.  Platelets control bleeding.  Plasma helps clot blood.  Other blood products are available for specialized needs, such as hemophilia or other clotting disorders. BEFORE THE TRANSFUSION  Who gives blood for transfusions?   Healthy volunteers who are fully evaluated to make sure their blood is safe. This is blood bank blood. Transfusion therapy is the safest it has ever been in the practice of medicine. Before blood is taken from a donor, a complete history is taken to make sure that person has no history of diseases nor engages in risky social behavior (examples are intravenous drug use or sexual activity with multiple partners). The donor's travel history is screened to minimize risk of transmitting  infections, such as malaria. The donated blood is tested for signs of infectious diseases, such as HIV and hepatitis. The blood is then tested to be sure it is compatible with you in order to minimize the chance of a transfusion reaction. If you or a relative donates blood, this is often done in anticipation of surgery and is not appropriate for emergency situations. It takes many days to process the donated blood. RISKS AND COMPLICATIONS Although transfusion therapy is very safe and saves many lives, the main dangers of transfusion include:   Getting an infectious disease.  Developing a transfusion reaction. This is an allergic reaction to something in the blood you were given. Every precaution is taken to prevent this. The decision to have a blood transfusion has been considered carefully by your caregiver before blood is given. Blood is not given unless the benefits outweigh the risks. AFTER THE TRANSFUSION  Right after receiving a blood transfusion, you will usually feel much better and more energetic. This is especially true if your red blood cells have gotten low (anemic). The transfusion raises the level of the red blood cells which carry oxygen, and this usually causes an energy increase.  The nurse administering the transfusion will monitor you carefully for complications. HOME CARE INSTRUCTIONS  No special instructions are needed after a transfusion. You may find your energy is better. Speak with your caregiver about any limitations on activity for underlying diseases you may have. SEEK MEDICAL CARE IF:   Your condition is not improving after your transfusion.  You develop  redness or irritation at the intravenous (IV) site. SEEK IMMEDIATE MEDICAL CARE IF:  Any of the following symptoms occur over the next 12 hours:  Shaking chills.  You have a temperature by mouth above 102 F (38.9 C), not controlled by medicine.  Chest, back, or muscle pain.  People around you feel you are  not acting correctly or are confused.  Shortness of breath or difficulty breathing.  Dizziness and fainting.  You get a rash or develop hives.  You have a decrease in urine output.  Your urine turns a dark color or changes to pink, red, or brown. Any of the following symptoms occur over the next 10 days:  You have a temperature by mouth above 102 F (38.9 C), not controlled by medicine.  Shortness of breath.  Weakness after normal activity.  The white part of the eye turns yellow (jaundice).  You have a decrease in the amount of urine or are urinating less often.  Your urine turns a dark color or changes to pink, red, or brown. Document Released: 01/05/2000 Document Revised: 04/01/2011 Document Reviewed: 08/24/2007 [facility] Patient Information 2014 [place], [place].  _______________________________________________________________________

## 2016-02-22 NOTE — Patient Instructions (Addendum)
Our office will call you with the biopsy results. Your CT Scan is booked for 02/23/16 at 8am, please arrive at 7:30am at [facility]. Follow attached instructions.  DO NOT TAKE METFORMIN FOR 48HRS AFTER CT SCAN     Preparing for your Surgery  Plan for surgery on 02/27/16 at 1:15pm with Dr. Nancy Marus.  You will be scheduled for a right radical vulvectomy with right inguinal lymph node dissection.  Pre-operative Testing -You will receive a phone call from presurgical testing at [facility] to arrange for a pre-operative testing appointment before your surgery.  This appointment normally occurs one to two weeks before your scheduled surgery.   -Bring your insurance card, copy of an advanced directive if applicable, medication list  -At that visit, you will be asked to sign a consent for a possible blood transfusion in case a transfusion becomes necessary during surgery.  The need for a blood transfusion is rare but having consent is a necessary part of your care.     -You should not be taking blood thinners or aspirin at least ten days prior to surgery unless instructed by your surgeon.  STOP ASPIRIN NOW  Day Before Surgery at [place] will be asked to take in a light diet the day before surgery.  Avoid carbonated beverages.  You will be advised to have nothing to eat or drink after midnight the evening before.     Eat a light diet the day before surgery.  Examples including soups, broths, toast, yogurt, mashed potatoes.  Things to avoid include carbonated beverages (fizzy beverages), raw fruits and raw vegetables, or beans.    If your bowels are filled with gas, your surgeon will have difficulty visualizing your pelvic organs which increases your surgical risks.  Your role in recovery Your role is to become active as soon as directed by your doctor, while still giving yourself time to heal.  Rest when you feel tired. You will be asked to do the following in order to speed  your recovery:  - Cough and breathe deeply. This helps toclear and expand your lungs and can prevent pneumonia. You may be given a spirometer to practice deep breathing. A staff member will show you how to use the spirometer. - Do mild physical activity. Walking or moving your legs help your circulation and body functions return to normal. A staff member will help you when you try to walk and will provide you with simple exercises. Do not try to get up or walk alone the first time. - Actively manage your pain. Managing your pain lets you move in comfort. We will ask you to rate your pain on a scale of zero to 10. It is your responsibility to tell your doctor or nurse where and how much you hurt so your pain can be treated.  Special Considerations -If you are diabetic, you may be placed on insulin after surgery to have closer control over your blood sugars to promote healing and recovery.  This does not mean that you will be discharged on insulin.  If applicable, your oral antidiabetics will be resumed when you are tolerating a solid diet.  -Your final pathology results from surgery should be available by the Friday after surgery and the results will be relayed to you when available.  Eat a light diet the day before surgery.  Examples including soups, broths, toast, yogurt, mashed potatoes.  Things to avoid include carbonated beverages (fizzy beverages), raw fruits and raw vegetables,  or beans.   If your bowels are filled with gas, your surgeon will have difficulty visualizing your pelvic organs which increases your surgical risks. Blood Transfusion Information WHAT IS A BLOOD TRANSFUSION? A transfusion is the replacement of blood or some of its parts. Blood is made up of multiple cells which provide different functions.  Red blood cells carry oxygen and are used for blood loss replacement.  White blood cells fight against infection.  Platelets control bleeding.  Plasma helps clot  blood.  Other blood products are available for specialized needs, such as hemophilia or other clotting disorders. BEFORE THE TRANSFUSION  Who gives blood for transfusions?   You may be able to donate blood to be used at a later date on yourself (autologous donation).  Relatives can be asked to donate blood. This is generally not any safer than if you have received blood from a stranger. The same precautions are taken to ensure safety when a relative's blood is donated.  Healthy volunteers who are fully evaluated to make sure their blood is safe. This is blood bank blood. Transfusion therapy is the safest it has ever been in the practice of medicine. Before blood is taken from a donor, a complete history is taken to make sure that person has no history of diseases nor engages in risky social behavior (examples are intravenous drug use or sexual activity with multiple partners). The donor's travel history is screened to minimize risk of transmitting infections, such as malaria. The donated blood is tested for signs of infectious diseases, such as HIV and hepatitis. The blood is then tested to be sure it is compatible with you in order to minimize the chance of a transfusion reaction. If you or a relative donates blood, this is often done in anticipation of surgery and is not appropriate for emergency situations. It takes many days to process the donated blood. RISKS AND COMPLICATIONS Although transfusion therapy is very safe and saves many lives, the main dangers of transfusion include:   Getting an infectious disease.  Developing a transfusion reaction. This is an allergic reaction to something in the blood you were given. Every precaution is taken to prevent this. The decision to have a blood transfusion has been considered carefully by your caregiver before blood is given. Blood is not given unless the benefits outweigh the risks.

## 2016-02-22 NOTE — Progress Notes (Addendum)
Consult Note: Gyn-Onc  Consult was requested by Dr. Elonda Husky for the evaluation of [name] 76 y.o. female   CC:  Chief Complaint  Patient presents with  . labial lesion    Assessment/Plan:  [name]  is a 76 y.o.  year old with a labial lesion.  We will follow-up the results of today's biopsy. I suspect that this lesion represents an invasive carcinoma based on its raised nodular appearance. It is well lateralized and small in size. If invasive cancer, we will schedule for right radical vulvectomy and right lymphadenectomy with my partner, Dr Nancy Marus on Tuesday 02/27/16. I discussed the nature of the procedure including radical resection and drain placement in groin with inguinofemoral lymphadenectomy. I discussed the risks of the procedure including wound separation, bleeding, infection. I discussed the risk of lymphedema and groin wound infection. I discussed postoperative care including drain management.  We will obtain a preop CT abdo/pelvis to evaluate for lymphadenopathy.  She was counseled to stop her ASA 81mg .   HPI: [name] is a 76 year old woman who is seen in consultation at the request of Dr Elonda Husky for a right labial lesion.    The patient reports that she began feeling pruritis of the vulva in October, 2017. She was seen by her PCP who saw no lesion and prescribed estradiol.  The pruritis persisted and became worse and she began to feel a nodule in the area and therefore again saw her PCP followed by a referral to gynecologist, Dr Elonda Husky on 02/20/16. The nodular lesion was appreciated on the right vulva. No biopsy was taken at that time.  Current Meds:  Outpatient Encounter Prescriptions as of 02/22/2016  Medication Sig  . cholecalciferol (VITAMIN D) 1000 units tablet Take 1,000 Units by mouth daily.  Marland Kitchen aspirin 81 MG tablet Take 81 mg by mouth daily.  Marland Kitchen CINNAMON PO Take by mouth. 2,000 mg daily  . furosemide (LASIX) 40 MG tablet TAKE ONE TABLET BY  MOUTH ONCE DAILY.  Marland Kitchen gabapentin (NEURONTIN) 100 MG capsule TAKE 1 CAPSULE BY MOUTH THREE TIMES A DAY.  Marland Kitchen HYDROcodone-acetaminophen (NORCO/VICODIN) 5-325 MG tablet TAKE (1) TABLET BY MOUTH TWICE A DAY AS NEEDED.  Marland Kitchen ketoconazole (NIZORAL) 2 % cream Apply 1 application topically 2 (two) times daily.  Marland Kitchen losartan (COZAAR) 50 MG tablet TAKE ONE TABLET BY MOUTH ONCE DAILY.  . metFORMIN (GLUCOPHAGE) 500 MG tablet TAKE (1) TABLET BY MOUTH TWICE DAILY.  Marland Kitchen omeprazole (PRILOSEC) 20 MG capsule   . potassium chloride SA (K-DUR,KLOR-CON) 20 MEQ tablet Take 1 tablet (20 mEq total) by mouth daily.  Marland Kitchen triamcinolone cream (KENALOG) 0.1 % Apply 1 application topically 2 (two) times daily as needed (rash).   No facility-administered encounter medications on file as of 02/22/2016.     Allergy:  Allergies  Allergen Reactions  . Celebrex [Celecoxib]     GI upset   . Doxycycline Nausea Only    Gi upset  . Lodine [Etodolac] Nausea Only    Gi upset  . Nabumetone     (relafen)  . Tetracycline   . Xarelto [Rivaroxaban] Hives    Social Hx:   Social History   Social History  . Marital status: Married    Spouse name: N/A  . Number of children: N/A  . Years of education: N/A   Occupational History  . Not on file.   Social History Main Topics  . Smoking status: Never Smoker  . Smokeless tobacco: Never Used  .  Alcohol use No  . Drug use: No  . Sexual activity: Not on file   Other Topics Concern  . Not on file   Social History Narrative  . No narrative on file    Past Surgical Hx:  Past Surgical History:  Procedure Laterality Date  . ABDOMINAL HYSTERECTOMY    . BACK SURGERY    . COLONOSCOPY    . KNEE SURGERY Left   . TONSILLECTOMY    . TOTAL HIP ARTHROPLASTY Left 10/27/2013   Procedure: LEFT TOTAL HIP ARTHROPLASTY;  Surgeon: Tobi Bastos, MD;  Location: [facility];  Service: Orthopedics;  Laterality: Left;  . TOTAL KNEE ARTHROPLASTY Right     Past Medical Hx:  Past Medical History:   Diagnosis Date  . Aortic stenosis, mild   . Aortic valve stenosis, mild   . Arthritis   . Complication of anesthesia   . Hypertension   . IFG (impaired fasting glucose)   . PONV (postoperative nausea and vomiting)    years ago with back surgery-nausea  . Vitamin D deficiency     Past Gynecological History:  Remote hx of hysterectomy for fibroids. No hx of abnromal paps (last pap remote) No LMP recorded. Patient has had a hysterectomy.  Family Hx:  Family History  Problem Relation Age of Onset  . Hypertension Mother   . Hypertension Father   . Cancer Sister     Breast  . Hypertension Brother     Review of Systems:  Constitutional  Feels well,    ENT Normal appearing ears and nares bilaterally Skin/Breast  + vulvar pruritis. Cardiovascular  No chest pain, shortness of breath, or edema  Pulmonary  No cough or wheeze.  Gastro Intestinal  No nausea, vomitting, or diarrhoea. No bright red blood per rectum, no abdominal pain, change in bowel movement, or constipation.  Genito Urinary  No frequency, urgency, dysuria, no bleeding Musculo Skeletal  No myalgia, arthralgia, joint swelling or pain  Neurologic  No weakness, numbness, change in gait,  Psychology  No depression, anxiety, insomnia.   Vitals:  There were no vitals taken for this visit.  Physical Exam: WD in NAD Neck  Supple NROM, without any enlargements.  Lymph Node Survey No cervical supraclavicular or inguinal adenopathy Cardiovascular  Pulse normal rate, regularity and rhythm. S1 and S2 normal.  Lungs  Clear to auscultation bilateraly, without wheezes/crackles/rhonchi. Good air movement.  Skin  No rash/lesions/breakdown  Psychiatry  Alert and oriented to person, place, and time  Abdomen  Normoactive bowel sounds, abdomen soft, non-tender and obese without evidence of hernia.  Back No CVA tenderness Genito Urinary  Vulva/vagina: 1.5cm round, raised nodular lesion (mobile, not fixed) >2cm from  midline and >2cm from urethral meatus. The surrounding vulvar skin has decreased pigmentation and changes consistent with lichen sclerosis.  Bladder/urethra:  No lesions or masses, well supported bladder  Vagina: atrophic, no lesions  Cervix: surgically absent  Uterus: surgically absen  Adnexa: no palpable masses. Rectal  deferred Extremities  No bilateral cyanosis, clubbing or edema.  PROCEDURE:  Vulvar biopsy  Verbal consent obtained.  Time out performed. Betadine applied 1cc of 2% lidocaine infiltrated into right labial lesion. 17mm punch taken from center/lateral region. Hemostasis achieved with silver nitrate. EBL: <2cc Condition: stable Specimen: right mid labia.   Donaciano Eva, MD  02/22/2016, 12:54 PM  Pathology from biopsy confirmed invasive SCC. Plan is to proceed with radical right vulvectomy and right inguinofemoral lymphadenectomy. CT scan was negative for evidence of suspicious  nodes or distant mets. Donaciano Eva, MD

## 2016-02-22 NOTE — Progress Notes (Signed)
10-31-14 ECHO EPIC CMET 02-22-16 EPIC

## 2016-02-22 NOTE — Telephone Encounter (Signed)
Pt notified of pre-admit appointment for 02/23/16 at 10am after CT scan. Discussed bowel prep of Magnesium Citrate, pt verbalized understanding.

## 2016-02-23 ENCOUNTER — Telehealth: Payer: Self-pay | Admitting: Gynecologic Oncology

## 2016-02-23 ENCOUNTER — Encounter (HOSPITAL_COMMUNITY): Payer: Self-pay

## 2016-02-23 ENCOUNTER — Ambulatory Visit (HOSPITAL_COMMUNITY)
Admission: RE | Admit: 2016-02-23 | Discharge: 2016-02-23 | Disposition: A | Discharge status: Home / Self Care | Payer: PPO | Source: Ambulatory Visit | Attending: Gynecologic Oncology | Admitting: Gynecologic Oncology

## 2016-02-23 ENCOUNTER — Encounter (HOSPITAL_COMMUNITY)
Admission: RE | Admit: 2016-02-23 | Discharge: 2016-02-23 | Disposition: A | Discharge status: Home / Self Care | Payer: PPO | Source: Ambulatory Visit | Attending: Gynecologic Oncology | Admitting: Gynecologic Oncology

## 2016-02-23 DIAGNOSIS — Z01818 Encounter for other preprocedural examination: Secondary | ICD-10-CM | POA: Diagnosis not present

## 2016-02-23 DIAGNOSIS — C519 Malignant neoplasm of vulva, unspecified: Secondary | ICD-10-CM | POA: Diagnosis not present

## 2016-02-23 DIAGNOSIS — N9089 Other specified noninflammatory disorders of vulva and perineum: Secondary | ICD-10-CM | POA: Diagnosis not present

## 2016-02-23 DIAGNOSIS — Z90710 Acquired absence of both cervix and uterus: Secondary | ICD-10-CM | POA: Diagnosis not present

## 2016-02-23 DIAGNOSIS — R938 Abnormal findings on diagnostic imaging of other specified body structures: Secondary | ICD-10-CM | POA: Insufficient documentation

## 2016-02-23 DIAGNOSIS — E119 Type 2 diabetes mellitus without complications: Secondary | ICD-10-CM | POA: Insufficient documentation

## 2016-02-23 HISTORY — DX: Cardiac murmur, unspecified: R01.1

## 2016-02-23 LAB — CBC WITH DIFFERENTIAL/PLATELET
BASOS ABS: 0 10*3/uL (ref 0.0–0.1)
BASOS PCT: 1 %
EOS PCT: 1 %
Eosinophils Absolute: 0.1 10*3/uL (ref 0.0–0.7)
HCT: 26.3 % — ABNORMAL LOW (ref 36.0–46.0)
Hemoglobin: 8.5 g/dL — ABNORMAL LOW (ref 12.0–15.0)
Lymphocytes Relative: 24 %
Lymphs Abs: 2.1 10*3/uL (ref 0.7–4.0)
MCH: 25.8 pg — ABNORMAL LOW (ref 26.0–34.0)
MCHC: 32.3 g/dL (ref 30.0–36.0)
MCV: 79.7 fL (ref 78.0–100.0)
MONO ABS: 0.6 10*3/uL (ref 0.1–1.0)
Monocytes Relative: 7 %
Neutro Abs: 5.8 10*3/uL (ref 1.7–7.7)
Neutrophils Relative %: 67 %
PLATELETS: 202 10*3/uL (ref 150–400)
RBC: 3.3 MIL/uL — ABNORMAL LOW (ref 3.87–5.11)
RDW: 15.8 % — AB (ref 11.5–15.5)
WBC: 8.7 10*3/uL (ref 4.0–10.5)

## 2016-02-23 LAB — URINALYSIS, ROUTINE W REFLEX MICROSCOPIC
BILIRUBIN URINE: NEGATIVE
Bacteria, UA: NONE SEEN
Glucose, UA: NEGATIVE mg/dL
KETONES UR: NEGATIVE mg/dL
Nitrite: NEGATIVE
PROTEIN: NEGATIVE mg/dL
Specific Gravity, Urine: >1.046 — ABNORMAL HIGH (ref 1.005–1.030)
pH: 5 (ref 5.0–8.0)

## 2016-02-23 LAB — PROTIME-INR
INR: 1.05
Prothrombin Time: 13.7 seconds (ref 11.4–15.2)

## 2016-02-23 LAB — GLUCOSE, CAPILLARY: GLUCOSE-CAPILLARY: 107 mg/dL — AB (ref 65–99)

## 2016-02-23 LAB — APTT: APTT: 34 s (ref 24–36)

## 2016-02-23 MED ORDER — IOPAMIDOL (ISOVUE-300) INJECTION 61%
100.0000 mL | Freq: Once | INTRAVENOUS | Status: AC | PRN
  Administered 2016-02-23: 100 mL via INTRAVENOUS

## 2016-02-23 MED ORDER — IOPAMIDOL (ISOVUE-370) INJECTION 76%
INTRAVENOUS | Status: AC
  Filled 2016-02-23: qty 100

## 2016-02-23 NOTE — Progress Notes (Signed)
SPOKE WITH MELISSA CROSS NP AND MADE AWARE CBC WITH DIF AND UA RESULTS

## 2016-02-23 NOTE — Telephone Encounter (Signed)
Informed patient of biopsy results confirming invasive cancer. CT negative for gross metastatic disease. Recommendation is for right radical vulvectomy and right inguinal lymphadenectomy. No change in surgical plan.  Donaciano Eva, MD

## 2016-02-23 NOTE — Telephone Encounter (Signed)
Patient informed of biopsy results and CT results.  All questions answered.  Advised to call for any questions or concerns.  Advised that she does not need a bowel prep.

## 2016-02-24 LAB — HEMOGLOBIN A1C
Hgb A1c MFr Bld: 5.5 % (ref 4.8–5.6)
Mean Plasma Glucose: 111 mg/dL

## 2016-02-25 LAB — URINE CULTURE

## 2016-02-26 ENCOUNTER — Telehealth: Payer: Self-pay | Admitting: Gynecologic Oncology

## 2016-02-26 DIAGNOSIS — N390 Urinary tract infection, site not specified: Secondary | ICD-10-CM

## 2016-02-26 MED ORDER — NITROFURANTOIN MONOHYD MACRO 100 MG PO CAPS: 100.0000 mg | ORAL_CAPSULE | Freq: Two times a day (BID) | ORAL | 0 refills | Status: DC

## 2016-02-26 NOTE — Telephone Encounter (Signed)
Returning call to patient.  She had called and left a message asking about her stage.  Advised that we would know the stage after her surgical procedure tomorrow.  Also informed the patient of her urine culture results and Dr. Serita Grit recommendations to begin taking Macrobid BID for three days.  Patient denies any urinary symptoms.  Patient verbalizing understanding and advised to call for any needs or concerns.

## 2016-02-27 ENCOUNTER — Ambulatory Visit (HOSPITAL_COMMUNITY): Payer: PPO | Admitting: Anesthesiology

## 2016-02-27 ENCOUNTER — Encounter (HOSPITAL_COMMUNITY)
Admission: RE | Disposition: A | Discharge status: Home / Self Care | Payer: Self-pay | Source: Ambulatory Visit | Attending: Obstetrics & Gynecology

## 2016-02-27 ENCOUNTER — Ambulatory Visit: Payer: PPO | Admitting: Family Medicine

## 2016-02-27 ENCOUNTER — Ambulatory Visit (HOSPITAL_COMMUNITY)
Admission: RE | Admit: 2016-02-27 | Discharge: 2016-02-28 | Disposition: A | Discharge status: Home / Self Care | Payer: PPO | Source: Ambulatory Visit | Attending: Obstetrics & Gynecology | Admitting: Obstetrics & Gynecology

## 2016-02-27 ENCOUNTER — Encounter (HOSPITAL_COMMUNITY): Payer: Self-pay | Admitting: *Deleted

## 2016-02-27 DIAGNOSIS — C774 Secondary and unspecified malignant neoplasm of inguinal and lower limb lymph nodes: Secondary | ICD-10-CM | POA: Diagnosis not present

## 2016-02-27 DIAGNOSIS — Z881 Allergy status to other antibiotic agents status: Secondary | ICD-10-CM | POA: Diagnosis not present

## 2016-02-27 DIAGNOSIS — C519 Malignant neoplasm of vulva, unspecified: Secondary | ICD-10-CM | POA: Diagnosis not present

## 2016-02-27 DIAGNOSIS — C775 Secondary and unspecified malignant neoplasm of intrapelvic lymph nodes: Secondary | ICD-10-CM | POA: Insufficient documentation

## 2016-02-27 DIAGNOSIS — L900 Lichen sclerosus et atrophicus: Secondary | ICD-10-CM | POA: Insufficient documentation

## 2016-02-27 DIAGNOSIS — R011 Cardiac murmur, unspecified: Secondary | ICD-10-CM | POA: Diagnosis not present

## 2016-02-27 DIAGNOSIS — Z7984 Long term (current) use of oral hypoglycemic drugs: Secondary | ICD-10-CM | POA: Diagnosis not present

## 2016-02-27 DIAGNOSIS — R197 Diarrhea, unspecified: Secondary | ICD-10-CM | POA: Diagnosis not present

## 2016-02-27 DIAGNOSIS — N9089 Other specified noninflammatory disorders of vulva and perineum: Secondary | ICD-10-CM

## 2016-02-27 DIAGNOSIS — Z888 Allergy status to other drugs, medicaments and biological substances status: Secondary | ICD-10-CM | POA: Insufficient documentation

## 2016-02-27 DIAGNOSIS — E119 Type 2 diabetes mellitus without complications: Secondary | ICD-10-CM | POA: Insufficient documentation

## 2016-02-27 DIAGNOSIS — I350 Nonrheumatic aortic (valve) stenosis: Secondary | ICD-10-CM | POA: Insufficient documentation

## 2016-02-27 DIAGNOSIS — I10 Essential (primary) hypertension: Secondary | ICD-10-CM | POA: Insufficient documentation

## 2016-02-27 DIAGNOSIS — Z7982 Long term (current) use of aspirin: Secondary | ICD-10-CM | POA: Diagnosis not present

## 2016-02-27 DIAGNOSIS — E785 Hyperlipidemia, unspecified: Secondary | ICD-10-CM | POA: Diagnosis not present

## 2016-02-27 DIAGNOSIS — N390 Urinary tract infection, site not specified: Secondary | ICD-10-CM

## 2016-02-27 HISTORY — PX: VULVECTOMY: SHX1086

## 2016-02-27 LAB — GLUCOSE, CAPILLARY
GLUCOSE-CAPILLARY: 88 mg/dL (ref 65–99)
GLUCOSE-CAPILLARY: 96 mg/dL (ref 65–99)
GLUCOSE-CAPILLARY: 145 mg/dL — AB (ref 65–99)
Glucose-Capillary: 107 mg/dL — ABNORMAL HIGH (ref 65–99)

## 2016-02-27 LAB — TYPE AND SCREEN
ABO/RH(D): B POS
Antibody Screen: NEGATIVE

## 2016-02-27 SURGERY — VULVECTOMY, WITH LYMPHADENECTOMY
Anesthesia: General | Laterality: Right

## 2016-02-27 MED ORDER — MEPERIDINE HCL 50 MG/ML IJ SOLN: 6.2500 mg | INTRAMUSCULAR | Status: DC | PRN

## 2016-02-27 MED ORDER — OXYCODONE-ACETAMINOPHEN 5-325 MG PO TABS
1.0000 | ORAL_TABLET | ORAL | Status: DC | PRN
  Administered 2016-02-27: 1 via ORAL
  Filled 2016-02-27: qty 1

## 2016-02-27 MED ORDER — ONDANSETRON HCL 4 MG PO TABS: 4.0000 mg | ORAL_TABLET | Freq: Four times a day (QID) | ORAL | Status: DC | PRN

## 2016-02-27 MED ORDER — METFORMIN HCL 500 MG PO TABS
500.0000 mg | ORAL_TABLET | Freq: Two times a day (BID) | ORAL | Status: DC
  Administered 2016-02-27 – 2016-02-28 (×2): 500 mg via ORAL
  Filled 2016-02-27 (×2): qty 1

## 2016-02-27 MED ORDER — ONDANSETRON HCL 4 MG/2ML IJ SOLN
INTRAMUSCULAR | Status: AC
  Filled 2016-02-27: qty 2

## 2016-02-27 MED ORDER — LIDOCAINE HCL (CARDIAC) 20 MG/ML IV SOLN
INTRAVENOUS | Status: DC | PRN
  Administered 2016-02-27: 40 mg via INTRAVENOUS

## 2016-02-27 MED ORDER — FENTANYL CITRATE (PF) 100 MCG/2ML IJ SOLN
INTRAMUSCULAR | Status: AC
  Filled 2016-02-27: qty 2

## 2016-02-27 MED ORDER — SODIUM CHLORIDE 0.9 % IJ SOLN
INTRAMUSCULAR | Status: AC
  Filled 2016-02-27: qty 50

## 2016-02-27 MED ORDER — SODIUM CHLORIDE 0.9 % IJ SOLN
INTRAMUSCULAR | Status: DC | PRN
Start: 2016-02-27 — End: 2016-02-27
  Administered 2016-02-27: 20 mL

## 2016-02-27 MED ORDER — SUGAMMADEX SODIUM 200 MG/2ML IV SOLN
INTRAVENOUS | Status: AC
  Filled 2016-02-27: qty 2

## 2016-02-27 MED ORDER — KCL IN DEXTROSE-NACL 20-5-0.45 MEQ/L-%-% IV SOLN
INTRAVENOUS | Status: DC
  Administered 2016-02-27: 18:00:00 via INTRAVENOUS
  Filled 2016-02-27: qty 1000

## 2016-02-27 MED ORDER — SUGAMMADEX SODIUM 200 MG/2ML IV SOLN
INTRAVENOUS | Status: DC | PRN
  Administered 2016-02-27: 150 mg via INTRAVENOUS

## 2016-02-27 MED ORDER — PROPOFOL 10 MG/ML IV BOLUS
INTRAVENOUS | Status: DC | PRN
  Administered 2016-02-27: 100 mg via INTRAVENOUS

## 2016-02-27 MED ORDER — TRAMADOL HCL 50 MG PO TABS: 100.0000 mg | ORAL_TABLET | Freq: Two times a day (BID) | ORAL | Status: DC | PRN

## 2016-02-27 MED ORDER — PROPOFOL 10 MG/ML IV BOLUS
INTRAVENOUS | Status: AC
  Filled 2016-02-27: qty 20

## 2016-02-27 MED ORDER — ONDANSETRON HCL 4 MG/2ML IJ SOLN
INTRAMUSCULAR | Status: DC | PRN
  Administered 2016-02-27: 4 mg via INTRAVENOUS

## 2016-02-27 MED ORDER — FENTANYL CITRATE (PF) 100 MCG/2ML IJ SOLN
INTRAMUSCULAR | Status: DC | PRN
  Administered 2016-02-27 (×3): 50 ug via INTRAVENOUS

## 2016-02-27 MED ORDER — LACTATED RINGERS IV SOLN
INTRAVENOUS | Status: DC | PRN
  Administered 2016-02-27: 12:00:00 via INTRAVENOUS

## 2016-02-27 MED ORDER — CEFAZOLIN SODIUM-DEXTROSE 2-4 GM/100ML-% IV SOLN
2.0000 g | INTRAVENOUS | Status: AC
  Administered 2016-02-27: 2 g via INTRAVENOUS
  Filled 2016-02-27: qty 100

## 2016-02-27 MED ORDER — BUPIVACAINE LIPOSOME 1.3 % IJ SUSP
INTRAMUSCULAR | Status: AC
  Filled 2016-02-27: qty 20

## 2016-02-27 MED ORDER — DEXAMETHASONE SODIUM PHOSPHATE 10 MG/ML IJ SOLN
INTRAMUSCULAR | Status: DC | PRN
  Administered 2016-02-27: 4 mg via INTRAVENOUS

## 2016-02-27 MED ORDER — METOCLOPRAMIDE HCL 5 MG/ML IJ SOLN: 10.0000 mg | Freq: Once | INTRAMUSCULAR | Status: DC | PRN

## 2016-02-27 MED ORDER — SUCCINYLCHOLINE CHLORIDE 200 MG/10ML IV SOSY
PREFILLED_SYRINGE | INTRAVENOUS | Status: AC
  Filled 2016-02-27: qty 10

## 2016-02-27 MED ORDER — LIDOCAINE 2% (20 MG/ML) 5 ML SYRINGE
INTRAMUSCULAR | Status: AC
  Filled 2016-02-27: qty 5

## 2016-02-27 MED ORDER — CEFAZOLIN SODIUM-DEXTROSE 2-4 GM/100ML-% IV SOLN
INTRAVENOUS | Status: AC
  Filled 2016-02-27: qty 100

## 2016-02-27 MED ORDER — INSULIN ASPART 100 UNIT/ML ~~LOC~~ SOLN: 0.0000 [IU] | Freq: Three times a day (TID) | SUBCUTANEOUS | Status: DC

## 2016-02-27 MED ORDER — FENTANYL CITRATE (PF) 100 MCG/2ML IJ SOLN
25.0000 ug | INTRAMUSCULAR | Status: DC | PRN
Start: 2016-02-27 — End: 2016-02-27
  Administered 2016-02-27 (×4): 25 ug via INTRAVENOUS

## 2016-02-27 MED ORDER — ROCURONIUM BROMIDE 100 MG/10ML IV SOLN
INTRAVENOUS | Status: DC | PRN
Start: 2016-02-27 — End: 2016-02-27
  Administered 2016-02-27: 40 mg via INTRAVENOUS
  Administered 2016-02-27: 10 mg via INTRAVENOUS

## 2016-02-27 MED ORDER — OXYCODONE-ACETAMINOPHEN 5-325 MG PO TABS: 1.0000 | ORAL_TABLET | ORAL | 0 refills | Status: DC | PRN

## 2016-02-27 MED ORDER — ASPIRIN EC 81 MG PO TBEC
81.0000 mg | DELAYED_RELEASE_TABLET | Freq: Every day | ORAL | Status: DC
  Administered 2016-02-27 – 2016-02-28 (×2): 81 mg via ORAL
  Filled 2016-02-27 (×2): qty 1

## 2016-02-27 MED ORDER — ENOXAPARIN SODIUM 40 MG/0.4ML ~~LOC~~ SOLN
40.0000 mg | SUBCUTANEOUS | Status: AC
  Administered 2016-02-27: 40 mg via SUBCUTANEOUS
  Filled 2016-02-27: qty 0.4

## 2016-02-27 MED ORDER — ONDANSETRON HCL 4 MG/2ML IJ SOLN: 4.0000 mg | Freq: Four times a day (QID) | INTRAMUSCULAR | Status: DC | PRN

## 2016-02-27 MED ORDER — BUPIVACAINE LIPOSOME 1.3 % IJ SUSP
INTRAMUSCULAR | Status: DC | PRN
  Administered 2016-02-27: 20 mL

## 2016-02-27 MED ORDER — LOSARTAN POTASSIUM 50 MG PO TABS
50.0000 mg | ORAL_TABLET | Freq: Every day | ORAL | Status: DC
  Administered 2016-02-28: 50 mg via ORAL
  Filled 2016-02-27: qty 1

## 2016-02-27 MED ORDER — HYDROMORPHONE HCL 2 MG/ML IJ SOLN: 0.2000 mg | INTRAMUSCULAR | Status: DC | PRN

## 2016-02-27 MED ORDER — ROCURONIUM BROMIDE 50 MG/5ML IV SOSY
PREFILLED_SYRINGE | INTRAVENOUS | Status: AC
  Filled 2016-02-27: qty 5

## 2016-02-27 MED ORDER — ENOXAPARIN SODIUM 40 MG/0.4ML ~~LOC~~ SOLN
40.0000 mg | SUBCUTANEOUS | Status: DC
  Administered 2016-02-28: 40 mg via SUBCUTANEOUS
  Filled 2016-02-27: qty 0.4

## 2016-02-27 MED ORDER — FUROSEMIDE 20 MG PO TABS
40.0000 mg | ORAL_TABLET | Freq: Every day | ORAL | Status: DC
  Administered 2016-02-28: 40 mg via ORAL
  Filled 2016-02-27: qty 2

## 2016-02-27 MED ORDER — DEXAMETHASONE SODIUM PHOSPHATE 10 MG/ML IJ SOLN
INTRAMUSCULAR | Status: AC
  Filled 2016-02-27: qty 1

## 2016-02-27 MED ORDER — PHENYLEPHRINE 40 MCG/ML (10ML) SYRINGE FOR IV PUSH (FOR BLOOD PRESSURE SUPPORT)
PREFILLED_SYRINGE | INTRAVENOUS | Status: AC
  Filled 2016-02-27: qty 10

## 2016-02-27 MED ORDER — GABAPENTIN 100 MG PO CAPS
100.0000 mg | ORAL_CAPSULE | Freq: Three times a day (TID) | ORAL | Status: DC
  Administered 2016-02-27 – 2016-02-28 (×3): 100 mg via ORAL
  Filled 2016-02-27 (×3): qty 1

## 2016-02-27 SURGICAL SUPPLY — 35 items
ATTRACTOMAT 16X20 MAGNETIC DRP (DRAPES) IMPLANT
BLADE SURG 15 STRL LF DISP TIS (BLADE) ×2 IMPLANT
BLADE SURG 15 STRL SS (BLADE) ×4
CLIP TI MEDIUM LARGE 6 (CLIP) IMPLANT
COVER MAYO STAND STRL (DRAPES) IMPLANT
DRAIN CHANNEL RND F F (WOUND CARE) ×3 IMPLANT
DRAPE SHEET LG 3/4 BI-LAMINATE (DRAPES) ×6 IMPLANT
DRAPE SURG IRRIG POUCH 19X23 (DRAPES) ×3 IMPLANT
DRSG TEGADERM 4X4.75 (GAUZE/BANDAGES/DRESSINGS) ×3 IMPLANT
ELECT PENCIL ROCKER SW 15FT (MISCELLANEOUS) ×3 IMPLANT
EVACUATOR SILICONE 100CC (DRAIN) ×3 IMPLANT
GAUZE SPONGE 4X4 12PLY STRL (GAUZE/BANDAGES/DRESSINGS) IMPLANT
GAUZE SPONGE 4X4 16PLY XRAY LF (GAUZE/BANDAGES/DRESSINGS) IMPLANT
GLOVE BIO SURGEON STRL SZ 6.5 (GLOVE) ×2 IMPLANT
GLOVE BIO SURGEON STRL SZ7.5 (GLOVE) ×6 IMPLANT
GLOVE BIO SURGEONS STRL SZ 6.5 (GLOVE) ×1
GLOVE BIOGEL PI IND STRL 7.0 (GLOVE) IMPLANT
GLOVE BIOGEL PI INDICATOR 7.0 (GLOVE)
LIQUID BAND (GAUZE/BANDAGES/DRESSINGS) ×3 IMPLANT
NS IRRIG 1000ML POUR BTL (IV SOLUTION) IMPLANT
PACK PERINEAL COLD (PAD) ×6 IMPLANT
SHEET LAVH (DRAPES) ×3 IMPLANT
SPONGE LAP 18X18 X RAY DECT (DISPOSABLE) IMPLANT
SUT VIC AB 2-0 CT2 27 (SUTURE) ×9 IMPLANT
SUT VIC AB 2-0 SH 27 (SUTURE)
SUT VIC AB 2-0 SH 27X BRD (SUTURE) IMPLANT
SUT VIC AB 3-0 PS2 18 (SUTURE)
SUT VIC AB 3-0 PS2 18XBRD (SUTURE) IMPLANT
SUT VICRYL 2 0 18  UND BR (SUTURE) ×2
SUT VICRYL 2 0 18 UND BR (SUTURE) ×1 IMPLANT
SUT VICRYL RAPIDE 3 0 (SUTURE) ×6 IMPLANT
TOWEL OR 17X26 10 PK STRL BLUE (TOWEL DISPOSABLE) ×3 IMPLANT
TRAY FOLEY W/METER SILVER 16FR (SET/KITS/TRAYS/PACK) ×3 IMPLANT
WATER STERILE IRR 1500ML POUR (IV SOLUTION) IMPLANT
YANKAUER SUCT BULB TIP 10FT TU (MISCELLANEOUS) ×3 IMPLANT

## 2016-02-27 NOTE — H&P (View-Only) (Signed)
Consult Note: Gyn-Onc  Consult was requested by Dr. Elonda Husky for the evaluation of [name] 76 y.o. female   CC:  Chief Complaint  Patient presents with  . labial lesion    Assessment/Plan:  Ms. [name]  is a 76 y.o.  year old with a labial lesion.  We will follow-up the results of today's biopsy. I suspect that this lesion represents an invasive carcinoma based on its raised nodular appearance. It is well lateralized and small in size. If invasive cancer, we will schedule for right radical vulvectomy and right lymphadenectomy with my partner, Dr Nancy Marus on Tuesday 02/27/16. I discussed the nature of the procedure including radical resection and drain placement in groin with inguinofemoral lymphadenectomy. I discussed the risks of the procedure including wound separation, bleeding, infection. I discussed the risk of lymphedema and groin wound infection. I discussed postoperative care including drain management.  We will obtain a preop CT abdo/pelvis to evaluate for lymphadenopathy.  She was counseled to stop her ASA 81mg .   HPI: [name] is a 76 year old woman who is seen in consultation at the request of Dr Elonda Husky for a right labial lesion.    The patient reports that she began feeling pruritis of the vulva in October, 2017. She was seen by her PCP who saw no lesion and prescribed estradiol.  The pruritis persisted and became worse and she began to feel a nodule in the area and therefore again saw her PCP followed by a referral to gynecologist, Dr Elonda Husky on 02/20/16. The nodular lesion was appreciated on the right vulva. No biopsy was taken at that time.  Current Meds:  Outpatient Encounter Prescriptions as of 02/22/2016  Medication Sig  . cholecalciferol (VITAMIN D) 1000 units tablet Take 1,000 Units by mouth daily.  Marland Kitchen aspirin 81 MG tablet Take 81 mg by mouth daily.  Marland Kitchen CINNAMON PO Take by mouth. 2,000 mg daily  . furosemide (LASIX) 40 MG tablet TAKE ONE TABLET BY  MOUTH ONCE DAILY.  Marland Kitchen gabapentin (NEURONTIN) 100 MG capsule TAKE 1 CAPSULE BY MOUTH THREE TIMES A DAY.  Marland Kitchen HYDROcodone-acetaminophen (NORCO/VICODIN) 5-325 MG tablet TAKE (1) TABLET BY MOUTH TWICE A DAY AS NEEDED.  Marland Kitchen ketoconazole (NIZORAL) 2 % cream Apply 1 application topically 2 (two) times daily.  Marland Kitchen losartan (COZAAR) 50 MG tablet TAKE ONE TABLET BY MOUTH ONCE DAILY.  . metFORMIN (GLUCOPHAGE) 500 MG tablet TAKE (1) TABLET BY MOUTH TWICE DAILY.  Marland Kitchen omeprazole (PRILOSEC) 20 MG capsule   . potassium chloride SA (K-DUR,KLOR-CON) 20 MEQ tablet Take 1 tablet (20 mEq total) by mouth daily.  Marland Kitchen triamcinolone cream (KENALOG) 0.1 % Apply 1 application topically 2 (two) times daily as needed (rash).   No facility-administered encounter medications on file as of 02/22/2016.     Allergy:  Allergies  Allergen Reactions  . Celebrex [Celecoxib]     GI upset   . Doxycycline Nausea Only    Gi upset  . Lodine [Etodolac] Nausea Only    Gi upset  . Nabumetone     (relafen)  . Tetracycline   . Xarelto [Rivaroxaban] Hives    Social Hx:   Social History   Social History  . Marital status: Married    Spouse name: N/A  . Number of children: N/A  . Years of education: N/A   Occupational History  . Not on file.   Social History Main Topics  . Smoking status: Never Smoker  . Smokeless tobacco: Never Used  .  Alcohol use No  . Drug use: No  . Sexual activity: Not on file   Other Topics Concern  . Not on file   Social History Narrative  . No narrative on file    Past Surgical Hx:  Past Surgical History:  Procedure Laterality Date  . ABDOMINAL HYSTERECTOMY    . BACK SURGERY    . COLONOSCOPY    . KNEE SURGERY Left   . TONSILLECTOMY    . TOTAL HIP ARTHROPLASTY Left 10/27/2013   Procedure: LEFT TOTAL HIP ARTHROPLASTY;  Surgeon: Tobi Bastos, MD;  Location: [facility];  Service: Orthopedics;  Laterality: Left;  . TOTAL KNEE ARTHROPLASTY Right     Past Medical Hx:  Past Medical History:   Diagnosis Date  . Aortic stenosis, mild   . Aortic valve stenosis, mild   . Arthritis   . Complication of anesthesia   . Hypertension   . IFG (impaired fasting glucose)   . PONV (postoperative nausea and vomiting)    years ago with back surgery-nausea  . Vitamin D deficiency     Past Gynecological History:  Remote hx of hysterectomy for fibroids. No hx of abnromal paps (last pap remote) No LMP recorded. Patient has had a hysterectomy.  Family Hx:  Family History  Problem Relation Age of Onset  . Hypertension Mother   . Hypertension Father   . Cancer Sister     Breast  . Hypertension Brother     Review of Systems:  Constitutional  Feels well,    ENT Normal appearing ears and nares bilaterally Skin/Breast  + vulvar pruritis. Cardiovascular  No chest pain, shortness of breath, or edema  Pulmonary  No cough or wheeze.  Gastro Intestinal  No nausea, vomitting, or diarrhoea. No bright red blood per rectum, no abdominal pain, change in bowel movement, or constipation.  Genito Urinary  No frequency, urgency, dysuria, no bleeding Musculo Skeletal  No myalgia, arthralgia, joint swelling or pain  Neurologic  No weakness, numbness, change in gait,  Psychology  No depression, anxiety, insomnia.   Vitals:  There were no vitals taken for this visit.  Physical Exam: WD in NAD Neck  Supple NROM, without any enlargements.  Lymph Node Survey No cervical supraclavicular or inguinal adenopathy Cardiovascular  Pulse normal rate, regularity and rhythm. S1 and S2 normal.  Lungs  Clear to auscultation bilateraly, without wheezes/crackles/rhonchi. Good air movement.  Skin  No rash/lesions/breakdown  Psychiatry  Alert and oriented to person, place, and time  Abdomen  Normoactive bowel sounds, abdomen soft, non-tender and obese without evidence of hernia.  Back No CVA tenderness Genito Urinary  Vulva/vagina: 1.5cm round, raised nodular lesion (mobile, not fixed) >2cm from  midline and >2cm from urethral meatus. The surrounding vulvar skin has decreased pigmentation and changes consistent with lichen sclerosis.  Bladder/urethra:  No lesions or masses, well supported bladder  Vagina: atrophic, no lesions  Cervix: surgically absent  Uterus: surgically absen  Adnexa: no palpable masses. Rectal  deferred Extremities  No bilateral cyanosis, clubbing or edema.  PROCEDURE:  Vulvar biopsy  Verbal consent obtained.  Time out performed. Betadine applied 1cc of 2% lidocaine infiltrated into right labial lesion. 3mm punch taken from center/lateral region. Hemostasis achieved with silver nitrate. EBL: <2cc Condition: stable Specimen: right mid labia.   Donaciano Eva, MD  02/22/2016, 12:54 PM  Pathology from biopsy confirmed invasive SCC. Plan is to proceed with radical right vulvectomy and right inguinofemoral lymphadenectomy. CT scan was negative for evidence of suspicious  nodes or distant mets. Donaciano Eva, MD

## 2016-02-27 NOTE — Discharge Summary (Signed)
Physician Discharge Summary  Patient ID: [name] MRN: [id number] DOB/AGE: 1940/04/15 76 y.o.  Admit date: 02/27/2016 Discharge date: 02/28/2016  Admission Diagnoses: Vulva cancer Discharge Diagnoses:  Active Problems:   UTI (urinary tract infection)   Vulva cancer [facility])   Discharged Condition: good  Hospital Course: On 02/27/2016, the patient underwent the following: Procedure(s): RADICAL RIGHT VULVECTOMY WITH RIGHT INGUINAL LYMPH NODE DISSECTION.   The postoperative course was uneventful.  CBGs were in range.  A hemoglobin on PODD #1 was 7.6; a preoperative hemoglobin was 8.5.  She was discharged to home on postoperative day 1 tolerating a regular diet.  Consults: None  Significant Diagnostic Studies: None  Treatments: See above  Discharge Exam: Blood pressure (!) 102/52, pulse 71, temperature 97.4 F (36.3 C), temperature source Oral, resp. rate 16, height 5\' 3"  (1.6 m), weight 195 lb (88.5 kg), SpO2 99 %.  JP: 47 ml General appearance: alert Resp: clear to auscultation bilaterally Cardio: regular rate and rhythm GI: soft, non-tender; bowel sounds normal; no masses,  no organomegaly Pelvic: Incision/wound C/D/I Incision/Wound: C/D/I  Disposition: 06-Home-Health Care Svc  Discharge Instructions    Call MD for:  extreme fatigue    Complete by:  As directed    Call MD for:  persistant dizziness or light-headedness    Complete by:  As directed    Call MD for:  persistant nausea and vomiting    Complete by:  As directed    Call MD for:  redness, tenderness, or signs of infection (pain, swelling, redness, odor or green/yellow discharge around incision site)    Complete by:  As directed    Call MD for:  severe uncontrolled pain    Complete by:  As directed    Call MD for:  temperature >100.4    Complete by:  As directed    Diet - low sodium heart healthy    Complete by:  As directed    Diet Carb Modified    Complete by:  As directed    Diet general    Complete by:   As directed    Discharge wound care:    Complete by:  As directed    Keep clean and dry   Driving Restrictions    Complete by:  As directed    No driving for 1- 2 weeks   Increase activity slowly    Complete by:  As directed    Lifting restrictions    Complete by:  As directed    No lifting > 5 lbs for 6 weeks   May shower / Bathe    Complete by:  As directed    Sexual Activity Restrictions    Complete by:  As directed    No intercourse for 6 - 8 weeks     Allergies as of 02/28/2016      Reactions   Celebrex [celecoxib]    GI upset   Doxycycline Nausea Only   Gi upset   Lodine [etodolac] Nausea Only   Gi upset   Nabumetone    (relafen)   Tetracycline    Xarelto [rivaroxaban] Hives      Medication List    TAKE these medications   aspirin 81 MG tablet Take 81 mg by mouth daily.   CALCIUM-MAGNESIUM-ZINC-D3 PO Take 2 tablets by mouth 2 (two) times daily.   CINNAMON PO Take 2 capsules by mouth daily. 2,000 mg daily   furosemide 40 MG tablet Commonly known as:  LASIX TAKE ONE TABLET BY  MOUTH ONCE DAILY.   gabapentin 100 MG capsule Commonly known as:  NEURONTIN TAKE 1 CAPSULE BY MOUTH THREE TIMES A DAY. What changed:  See the new instructions.   HYDROcodone-acetaminophen 5-325 MG tablet Commonly known as:  NORCO/VICODIN TAKE (1) TABLET BY MOUTH TWICE A DAY AS NEEDED.   ketoconazole 2 % cream Commonly known as:  NIZORAL Apply 1 application topically 2 (two) times daily.   losartan 50 MG tablet Commonly known as:  COZAAR TAKE ONE TABLET BY MOUTH ONCE DAILY.   metFORMIN 500 MG tablet Commonly known as:  GLUCOPHAGE TAKE (1) TABLET BY MOUTH TWICE DAILY. What changed:  how much to take  how to take this  when to take this  additional instructions   nitrofurantoin (macrocrystal-monohydrate) 100 MG capsule Commonly known as:  MACROBID Take 1 capsule (100 mg total) by mouth 2 (two) times daily.   oxyCODONE-acetaminophen 5-325 MG tablet Commonly  known as:  PERCOCET/ROXICET Take 1-2 tablets by mouth every 4 (four) hours as needed for severe pain.   potassium chloride SA 20 MEQ tablet Commonly known as:  K-DUR,KLOR-CON Take 1 tablet (20 mEq total) by mouth daily.        SignedLahoma Crocker A 02/28/2016, 8:15 AM

## 2016-02-27 NOTE — Anesthesia Preprocedure Evaluation (Addendum)
Anesthesia Evaluation  Patient identified by MRN, date of birth, ID band Patient awake    Reviewed: Allergy & Precautions, NPO status , Patient's Chart, lab work & pertinent test results  History of Anesthesia Complications (+) PONV and history of anesthetic complications  Airway Mallampati: III  TM Distance: >3 FB Neck ROM: Full  Mouth opening: Limited Mouth Opening  Dental  (+) Teeth Intact, Dental Advisory Given,    Pulmonary neg pulmonary ROS,    breath sounds clear to auscultation       Cardiovascular hypertension, + Valvular Problems/Murmurs AS  Rhythm:Regular Rate:Normal + Systolic murmurs AS moderate   Neuro/Psych    GI/Hepatic negative GI ROS, Neg liver ROS,   Endo/Other  diabetes  Renal/GU negative Renal ROS   Hx per surgeon    Musculoskeletal   Abdominal   Peds  Hematology   Anesthesia Other Findings   Reproductive/Obstetrics                           Anesthesia Physical Anesthesia Plan  ASA: III  Anesthesia Plan: General   Post-op Pain Management:    Induction: Intravenous  Airway Management Planned: Video Laryngoscope Planned and Oral ETT  Additional Equipment:   Intra-op Plan:   Post-operative Plan: Extubation in OR  Informed Consent: I have reviewed the patients History and Physical, chart, labs and discussed the procedure including the risks, benefits and alternatives for the proposed anesthesia with the patient or authorized representative who has indicated his/her understanding and acceptance.   Dental advisory given  Plan Discussed with: CRNA  Anesthesia Plan Comments:        Anesthesia Quick Evaluation

## 2016-02-27 NOTE — Op Note (Signed)
PATIENT: SERETA GAVER DATE OF BIRTH: 05/20/1940 ENCOUNTER DATE: 02/27/16   Preop Diagnosis: Stage I vulvar SCCa .   Postoperative Diagnosis: same.   Surgery: Right radical partial vulvectomy, right inguinal lymph node dissection  Surgeons:  Jamal Haskin A. Alycia Rossetti, MD; Lahoma Crocker, MD   Anesthesia: General   Estimated blood loss: 50  ml   IVF: 900 ml   Urine output: 123XX123 ml   Complications: None   Pathology: Right vulva with resection margin with suture at 12:00. Right inguinal nodes.   Operative findings: 1.5 cm fungating erosive lesion of the right vulva approximately 2 cm from the urethra. Evidence of lichen sclerosis throughout the entire vulvar. No other ulcerative lesion suspicious for vulvar carcinoma. Shotty palpable lymphadenopathy in the right inguinal space.  Procedure: The patient was identified in the preoperative holding area. Informed consent was signed on the chart. Patient was seen history was reviewed and exam was performed.   The patient was then taken to the operating room and placed in the supine position with SCD hose on. She was then placed in the dorsolithotomy position. General anesthesia was then induced without difficulty. First timeout was performed to confirm the patient, procedure, antibiotic, allergy status, estimated blood loss and OR time. The perineum was then prepped in the usual fashion with Betadine. A 14 French Foley was inserted into the bladder under sterile conditions. The abdomen, right groin and anterior thigh was then prepped with 1 Chlor prep sponge per protocol.   Patient was then draped after the prep was dried. Second timeout was performed to confirm the above.   A line was drawn from the pubic tubercle to the anterior superior iliac spine. Approximately 2 fingerbreadths below this a 10 cm incision was made with a 15 blade. Bovie cautery was then used to dissect into the deep subcutaneous tissues. Camper's fascia was incised. Using Bovie  cautery as well as the Harmonic scalpel the feeding vessels and lymphatics into the lymph node bundle were ligated and cauterized. This allowed [place] to circumferentially get around the nodal packet to the deep fascia. The vessels were palpated just below the fascia and we stated above the cribriform fascia. A superficial branch of the saphenous was identified and spared. The nodal packet was removed. Several shotty palpable lymph nodes were identified. None were particularly enlarged or concerning therefore none were sent for frozen section. There were also sent for permanent pathology. The nodal basin was irrigated and was noted to be hemostatic.  #10 JP drain was placed in the nodal basin through a stab wound in the left lower quadrant. It was secured with a 2-0 nylon. The superficial fascia was closed with a running suture of 2-0 Vicryl. The subcutaneous tissues were irrigated. The skin was reapproximated with a running subcuticular suture of 4-0 Monocryl. The incision was clean and dried and Dermabond was placed. Exparel was infiltrated with a dilution of 20 mL's of saline for postoperative pain control. The drain site was clean.  A dressing was applied and attention was turned to the vulva. A marking pen was used to outline the margins of the resection allowing 1.5-2 cm margin on the inferior, lateral, and superior surfaces. The skin was incised in the underlying adipose tissue was divided with electrocautery. The vascular bundles were isolated divided and ligated with the Harmonic scalpel. This was continued down to the deep fascia. After removal of the specimen was inspected and noted to have adequate margin circumferentially around the tumor. It was suture ligated  at 12:00 o'clock to oriented the specimen. The area was irrigated. The deep tissues were approximated with simple interrupted sutures of 2-0 Vicryl suture. The skin was closed with interrupted sutures using 3-0 Vicryl. The skin edges were  reapproximated without significant tension with the most medial sutures being still approximately 1 cm from the urethral meatus. There was adequate hemostasis. Ice pack was applied.  All instrument needle and Ray-Tec counts were correct x2. The patient tolerated the procedure well and was taken to the recovery room in stable condition. This is Nancy Marus dictating an operative note on patient RAYANNA SWEZEY.

## 2016-02-27 NOTE — Interval H&P Note (Signed)
History and Physical Interval Note:  02/27/2016 12:18 PM  [name]  has presented today for surgery, with the diagnosis of VULVAR CANCER  The various methods of treatment have been discussed with the patient and family. After consideration of risks, benefits and other options for treatment, the patient has consented to  Procedure(s): RADICAL RIGHT VULVECTOMY WITH RIGHT INGUINAL LYMPH NODE DISSECTION (Right) as a surgical intervention .  The patient's history has been reviewed, patient examined, no change in status, stable for surgery.  I have reviewed the patient's chart and labs.  Questions were answered to the patient's satisfaction.     [place] A.

## 2016-02-27 NOTE — Discharge Instructions (Addendum)
Iron Deficiency Anemia, Adult Iron deficiency anemia is a condition in which the concentration of red blood cells or hemoglobin in the blood is below normal because of too little iron. Hemoglobin is a substance in red blood cells that carries oxygen to the body's tissues. When the concentration of red blood cells or hemoglobin is too low, not enough oxygen reaches these tissues. Iron deficiency anemia is usually long-lasting (chronic) and it develops over time. It may or may not cause symptoms. It is a common type of anemia. What are the causes? This condition may be caused by:  Not enough iron in the diet.  Blood loss caused by bleeding in the intestine.  Blood loss from a gastrointestinal condition like Crohn disease.  Frequent blood draws, such as from blood donation.  Abnormal absorption in the gut.  Heavy menstrual periods in women.  Cancers of the gastrointestinal system, such as colon cancer. What are the signs or symptoms? Symptoms of this condition may include:  Fatigue.  Headache.  Pale skin, lips, and nail beds.  Poor appetite.  Weakness.  Shortness of breath.  Dizziness.  Cold hands and feet.  Fast or irregular heartbeat.  Irritability. This is more common in severe anemia.  Rapid breathing. This is more common in severe anemia. Mild anemia may not cause any symptoms. How is this diagnosed? This condition is diagnosed based on:  Your medical history.  A physical exam.  Blood tests. You may have additional tests to find the underlying cause of your anemia, such as:  Testing for blood in the stool (fecal occult blood test).  A procedure to see inside your colon and rectum (colonoscopy).  A procedure to see inside your esophagus and stomach (endoscopy).  A test in which cells are removed from bone marrow (bone marrow aspiration) or fluid is removed from the bone marrow to be examined (biopsy). This is rarely needed. How is this treated? This  condition is treated by correcting the cause of your iron deficiency. Treatment may involve:  Adding iron-rich foods to your diet.  Taking iron supplements. If you are pregnant or breastfeeding, you may need to take extra iron because your normal diet usually does not provide the amount of iron that you need.  Increasing vitamin C intake. Vitamin C helps your body absorb iron. Your health care provider may recommend that you take iron supplements along with a glass of orange juice or a vitamin C supplement.  Medicines to make heavy menstrual flow lighter.  Surgery. You may need repeat blood tests to determine whether treatment is working. Depending on the underlying cause, the anemia should be corrected within 2 months of starting treatment. If the treatment does not seem to be working, you may need more testing. Follow these instructions at home: Medicines  Take over-the-counter and prescription medicines only as told by your health care provider. This includes iron supplements and vitamins.  If you cannot tolerate taking iron supplements by mouth, talk with your health care provider about taking them through a vein (intravenously) or an injection into a muscle.  For the best iron absorption, you should take iron supplements when your stomach is empty. If you cannot tolerate them on an empty stomach, you may need to take them with food.  Do not drink milk or take antacids at the same time as your iron supplements. Milk and antacids may interfere with iron absorption.  Iron supplements can cause constipation. To prevent constipation, include fiber in your diet as told  by your health care provider. A stool softener may also be recommended. Eating and drinking  Talk with your health care provider before changing your diet. He or she may recommend that you eat foods that contain a lot of iron, such as:  Liver.  Low-fat (lean) beef.  Breads and cereals that have iron added to them (are  fortified).  Eggs.  Dried fruit.  Dark green, leafy vegetables.  To help your body use the iron from iron-rich foods, eat those foods at the same time as fresh fruits and vegetables that are high in vitamin C. Foods that are high in vitamin C include:  Oranges.  Peppers.  Tomatoes.  Mangoes.  Drinkenoughfluid to keep your urine clear or pale yellow. General instructions  Return to your normal activities as told by your health care provider. Ask your health care provider what activities are safe for you.  Practice good hygiene. Anemia can make you more prone to illness and infection.  Keep all follow-up visits as told by your health care provider. This is important. Contact a health care provider if:  You feel nauseous or you vomit.  You feel weak.  You have unexplained sweating.  You develop symptoms of constipation, such as:  Having fewer than three bowel movements a week.  Straining to have a bowel movement.  Having stools that are hard, dry, or larger than normal.  Feeling full or bloated.  Pain in the lower abdomen.  Not feeling relief after having a bowel movement. Get help right away if:  You faint. If this happens, do not drive yourself to the hospital. Call your local emergency services (911 in the [place]  You have chest pain.  You have shortness of breath that:  Is severe.  Gets worse with physical activity.  You have a rapid heartbeat.  You become light-headed when getting up from a sitting or lying down position. This information is not intended to replace advice given to you by your health care provider. Make sure you discuss any questions you have with your health care provider. Document Released: 01/05/2000 Document Revised: 09/27/2015 Document Reviewed: 09/27/2015 Elsevier Interactive Patient Education  2017 [employer]   Activity: 1. Be up and out of the bed during the day.  Take a nap if needed.  You may walk up steps but be  careful and use the hand rail.  Stair climbing will tire you more than you think, you may need to stop part way and rest.   2. No lifting or straining for 6 weeks.  3. No driving for 1-2 weeks.  Do Not drive if you are taking narcotic pain medicine.  4. Shower daily.  Use soap and water on your incision and pat dry; don't rub.   5. No sexual activity and nothing in the vagina for 8 weeks.  Diet: 1. Low sodium Heart Healthy Diet is recommended.  2. It is safe to use a laxative if you have difficulty moving your bowels.   Wound Care: 1. Keep clean and dry.  Shower daily.  Reasons to call the Doctor:   Fever - Oral temperature greater than 100.4 degrees Fahrenheit  Foul-smelling vaginal discharge  Difficulty urinating  Nausea and vomiting  Increased pain at the site of the incision that is unrelieved with pain medicine.  Difficulty breathing with or without chest pain  New calf pain especially if only on one side  Sudden, continuing increased vaginal bleeding with or without clots.   Follow-up: 1. See  Everitt Amber in 4 weeks.  Contacts: For questions or concerns you should contact:  Dr. Everitt Amber at [phone]  or at [facility]   Vulvectomy, Care After Introduction Refer to this sheet in the next few weeks. These instructions provide you with information about caring for yourself after your procedure. Your health care provider may also give you more specific instructions. Your treatment has been planned according to current medical practices, but problems sometimes occur. Call your health care provider if you have any problems or questions after your procedure. What can I expect after the procedure? After the procedure, it is common to have:  Vaginal pain.  Vaginal numbness.  Vaginal swelling.  Bloody vaginal discharge. Follow these instructions at home: Activity  Rest as told by your health care provider.  Do not lift, push, or pull more than  5 lb (2.3 kg).  Avoid activities that require a lot of energy for as long as told by your health care provider. This includes any exercise.  Raise (elevate) your legs while sitting or lying down.  Avoid standing or sitting in one place for long periods of time.  Do not cross your legs, especially when sitting. Bathing  Do not take baths, swim, or use a hot tub until your health care provider approves. Ask your health care provider if you can take showers. You may only be allowed to take sponge baths for bathing.  After passing urine or a bowel movement, wipe yourself from front to back and clean your vaginal area using a spray bottle.  If told by your health care provider, take a sitz bath to help with discomfort. This is a warm water bath you take while sitting down.  Do this 3-4 times per day, or as often as told by your health care provider.  The water should only come up to your hips and cover your buttocks.  You may pat the area dry with a soft, clean towel. If needed, you may then gently dry the area with a hair dryer on a cool setting for 5-10 minutes. An enclosed box fan may also be used to gently dry the area. Incision care  Follow instructions from your health care provider about how to take care of your incision. Make sure you:  Wash your hands with soap and water before you change your bandage (dressing). If soap and water are not available, use hand sanitizer.  Change your dressing as told by your health care provider.  Leave stitches (sutures), skin glue, adhesive strips, or surgical clips in place. These skin closures may need to stay in place for 2 weeks or longer. If adhesive strip edges start to loosen and curl up, you may trim the loose edges. Do not remove adhesive strips completely unless your health care provider tells you to do that.  Check your incision area every day for signs of infection. Check for:  More redness, swelling, or pain.  More fluid or  blood.  Warmth.  Pus or a bad smell. Lifestyle  Do not douche or use tampons until your health care provider approves.  Do not have sex until your health care provider approves. Tell your health care provider if you have pain or numbness when you return to sexual activity.  Wear comfortable, loose-fitting clothing. Driving  Do not drive or operate heavy machinery while taking prescription pain medicine.  Do not drive for 24 hours if you received a medicine to help you relax (sedative). General instructions  Take  over-the-counter and prescription medicines only as told by your health care provider.  Take a stool softener to help prevent constipation as told by your health care provider. You may need to do this if you are taking prescription pain medicine.  Drink enough fluid to keep your urine clear or pale yellow.  If you were sent home with a drain, take care of it as told by your health care provider.  Wear compression stockings as told by your health care provider. These stockings help to prevent blood clots and reduce swelling in your legs.  Keep all follow-up visits as told by your health care provider. This is important. Contact a health care provider if:  You have more redness, swelling, or pain around your incision.  You have more fluid or blood coming from your incision.  Your incision feels warm to the touch.  You have pus or a bad smell coming from your incision.  You have a fever.  You have painful or bloody urination.  You feel nauseous or you vomit.  You develop diarrhea.  You develop constipation.  You develop a rash.  You feel dizzy or light-headed.  You have pain that does not get better with medicine.  Your incision breaks open. Get help right away if:  You faint.  You develop leg or chest pain.  You develop abdominal pain.  You develop shortness of breath. This information is not intended to replace advice given to you by your  health care provider. Make sure you discuss any questions you have with your health care provider. Document Released: 08/22/2003 Document Revised: 06/15/2015 Document Reviewed: 01/02/2015  2017 Elsevier

## 2016-02-27 NOTE — Transfer of Care (Signed)
Immediate Anesthesia Transfer of Care Note  Patient: [name]  Procedure(s) Performed: Procedure(s): RADICAL RIGHT VULVECTOMY WITH RIGHT INGUINAL LYMPH NODE DISSECTION (Right)  Patient Location: PACU  Anesthesia Type:General  Level of Consciousness: awake, alert  and oriented  Airway & Oxygen Therapy: Patient Spontanous Breathing and Patient connected to face mask oxygen  Post-op Assessment: Report given to RN and Post -op Vital signs reviewed and stable  Post vital signs: Reviewed and stable  Last Vitals:  Vitals:   02/27/16 1118  BP: 137/70  Pulse: 96  Resp: 16  Temp: 36.7 C    Last Pain:  Vitals:   02/27/16 1118  TempSrc: Oral         Complications: No apparent anesthesia complications

## 2016-02-27 NOTE — Anesthesia Postprocedure Evaluation (Signed)
Anesthesia Post Note  Patient: [name]  Procedure(s) Performed: Procedure(s) (LRB): RADICAL RIGHT VULVECTOMY WITH RIGHT INGUINAL LYMPH NODE DISSECTION (Right)  Patient location during evaluation: PACU Anesthesia Type: General Level of consciousness: awake and alert Pain management: pain level controlled Vital Signs Assessment: post-procedure vital signs reviewed and stable Respiratory status: spontaneous breathing, nonlabored ventilation, respiratory function stable and patient connected to nasal cannula oxygen Cardiovascular status: blood pressure returned to baseline and stable Postop Assessment: no signs of nausea or vomiting Anesthetic complications: no       Last Vitals:  Vitals:   02/27/16 1530 02/27/16 1545  BP: 119/77 127/70  Pulse: 79 77  Resp: 12 14  Temp:      Last Pain:  Vitals:   02/27/16 1545  TempSrc:   PainSc: 4                  Adriene Knipfer,W. EDMOND

## 2016-02-27 NOTE — Anesthesia Procedure Notes (Signed)
Procedure Name: Intubation Date/Time: 02/27/2016 1:02 PM Performed by: Glory Buff Pre-anesthesia Checklist: Patient identified, Emergency Drugs available, Suction available and Patient being monitored Patient Re-evaluated:Patient Re-evaluated prior to inductionOxygen Delivery Method: Circle system utilized Preoxygenation: Pre-oxygenation with 100% oxygen Intubation Type: IV induction Ventilation: Mask ventilation without difficulty Laryngoscope Size: Glidescope and 3 Grade View: Grade I Tube type: Oral Tube size: 7.0 mm Number of attempts: 1 Airway Equipment and Method: Stylet and Oral airway Placement Confirmation: ETT inserted through vocal cords under direct vision,  positive ETCO2 and breath sounds checked- equal and bilateral Secured at: 20 cm Tube secured with: Tape Dental Injury: Teeth and Oropharynx as per pre-operative assessment

## 2016-02-28 ENCOUNTER — Telehealth: Payer: Self-pay

## 2016-02-28 DIAGNOSIS — C519 Malignant neoplasm of vulva, unspecified: Secondary | ICD-10-CM | POA: Diagnosis not present

## 2016-02-28 LAB — BASIC METABOLIC PANEL
ANION GAP: 7 (ref 5–15)
BUN: 19 mg/dL (ref 6–20)
CALCIUM: 8.6 mg/dL — AB (ref 8.9–10.3)
CHLORIDE: 107 mmol/L (ref 101–111)
CO2: 26 mmol/L (ref 22–32)
Creatinine, Ser: 0.8 mg/dL (ref 0.44–1.00)
GFR calc non Af Amer: >60 mL/min (ref >60)
Glucose, Bld: 143 mg/dL — ABNORMAL HIGH (ref 65–99)
POTASSIUM: 4.8 mmol/L (ref 3.5–5.1)
Sodium: 140 mmol/L (ref 135–145)

## 2016-02-28 LAB — CBC
HEMATOCRIT: 24.5 % — AB (ref 36.0–46.0)
HEMOGLOBIN: 7.6 g/dL — AB (ref 12.0–15.0)
MCH: 24.6 pg — ABNORMAL LOW (ref 26.0–34.0)
MCHC: 31 g/dL (ref 30.0–36.0)
MCV: 79.3 fL (ref 78.0–100.0)
Platelets: 208 10*3/uL (ref 150–400)
RBC: 3.09 MIL/uL — AB (ref 3.87–5.11)
RDW: 15.4 % (ref 11.5–15.5)
WBC: 7.4 10*3/uL (ref 4.0–10.5)

## 2016-02-28 LAB — GLUCOSE, CAPILLARY: Glucose-Capillary: 112 mg/dL — ABNORMAL HIGH (ref 65–99)

## 2016-02-28 NOTE — Progress Notes (Signed)
Patient discharged home with son and husband.  Rx for Percocet given, discharge instructions for JP maintenance and general post op instructions given and reviewed with patient. Patient verbalized understanding.  No complaints of pain noted.

## 2016-02-28 NOTE — Progress Notes (Signed)
Patient resting in bed this morning with no complaints.  No complaints of pain noted.

## 2016-02-28 NOTE — Telephone Encounter (Signed)
Told Ms Hirst that her post op visit is Wednesday 03-06-16 with Dr. Denman George at 1430.  She is to arrive to register at 1400. Pt verbalized understanding.

## 2016-03-01 ENCOUNTER — Ambulatory Visit: Payer: PPO | Admitting: Gynecologic Oncology

## 2016-03-05 ENCOUNTER — Telehealth: Payer: Self-pay

## 2016-03-05 DIAGNOSIS — C519 Malignant neoplasm of vulva, unspecified: Secondary | ICD-10-CM

## 2016-03-05 NOTE — Telephone Encounter (Signed)
-----   Message from Nancy Marus, MD sent at 03/01/2016  6:40 PM EST ----- Jovita Gamma Please get me an appointment for her to see Teryl Lucy. I will call her with her results next week once I get this information. Thanks [employer]

## 2016-03-06 ENCOUNTER — Encounter: Payer: Self-pay | Admitting: Radiation Oncology

## 2016-03-06 ENCOUNTER — Encounter: Payer: Self-pay | Admitting: Gynecologic Oncology

## 2016-03-06 ENCOUNTER — Ambulatory Visit: Payer: PPO | Attending: Gynecologic Oncology | Admitting: Gynecologic Oncology

## 2016-03-06 VITALS — BP 124/79 | HR 103 | Temp 97.9°F | Resp 18 | Ht 63.0 in | Wt 187.8 lb

## 2016-03-06 DIAGNOSIS — Z881 Allergy status to other antibiotic agents status: Secondary | ICD-10-CM | POA: Insufficient documentation

## 2016-03-06 DIAGNOSIS — Z7984 Long term (current) use of oral hypoglycemic drugs: Secondary | ICD-10-CM | POA: Insufficient documentation

## 2016-03-06 DIAGNOSIS — N771 Vaginitis, vulvitis and vulvovaginitis in diseases classified elsewhere: Secondary | ICD-10-CM | POA: Diagnosis not present

## 2016-03-06 DIAGNOSIS — C519 Malignant neoplasm of vulva, unspecified: Secondary | ICD-10-CM

## 2016-03-06 DIAGNOSIS — Z79899 Other long term (current) drug therapy: Secondary | ICD-10-CM | POA: Diagnosis not present

## 2016-03-06 DIAGNOSIS — Z7982 Long term (current) use of aspirin: Secondary | ICD-10-CM | POA: Diagnosis not present

## 2016-03-06 MED ORDER — TRIPLE ANTIBIOTIC 5-400-5000 EX OINT: TOPICAL_OINTMENT | Freq: Four times a day (QID) | CUTANEOUS | Status: DC

## 2016-03-06 NOTE — Progress Notes (Signed)
Consult Note: Gyn-Onc  Consult was requested by Dr. Elonda Husky for the evaluation of [name] 76 y.o. female   CC:  Chief Complaint  Patient presents with  . Vulvar Cancer    Assessment/Plan:  [name]  is a 76 y.o.  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   I am recommending CT chest for staging purposes. I am recommending adjuvant radiation to the right groin after she has healed from her lymphadenectomy.  With respect to the drainage - low volume approx 15cc per day. Will recheck next Tuesday and d.c. Drain if output continues to be low. Neosporin to be applied to left vulva for irritation.. Return to clinic in 1 week for drain check.   HPI: [name] is a 76 year old woman who is seen in consultation at the request of Dr Elonda Husky for a right labial lesion.    The patient reports that she began feeling pruritis of the vulva in October, 2017. She was seen by her PCP who saw no lesion and prescribed estradiol.  The pruritis persisted and became worse and she began to feel a nodule in the area and therefore again saw her PCP followed by a referral to gynecologist, Dr Elonda Husky on 02/20/16. The nodular lesion was appreciated on the right vulva. No biopsy was taken at that time.  Interval Hx:  Biopsy was performed on 02/22/16 and pathology from biopsy confirmed invasive SCC.   CT scan abdo/pelvis was negative for evidence of suspicious nodes or distant mets.  On 02/22/16 she underwent right radical vulvectomy and right inguinal lymphadenectomy with Dr Nancy Marus. Final pathology revealed: a 2.1cm moderately differentiated SCC of the right vulva. Margins were negative (closest was 3 o'clock, 24mm). Depth of invasion was 42mm. 1 of 6 inguinofemoral lymph nodes (right) was positive for metastatic disease.   Since surgery she has done very well with minimal pain other than irritation in the left vulva lip. Drain outputs  30-15cc per day. Consistent reductions.  Current Meds:  Outpatient Encounter Prescriptions as of 03/06/2016  Medication Sig  . aspirin 81 MG tablet Take 81 mg by mouth daily.  Marland Kitchen CINNAMON PO Take 2 capsules by mouth daily. 2,000 mg daily   . furosemide (LASIX) 40 MG tablet TAKE ONE TABLET BY MOUTH ONCE DAILY.  Marland Kitchen gabapentin (NEURONTIN) 100 MG capsule TAKE 1 CAPSULE BY MOUTH THREE TIMES A DAY. (Patient taking differently: TAKE 1 CAPSULE BY MOUTH TWO TIMES A DAY.)  . losartan (COZAAR) 50 MG tablet TAKE ONE TABLET BY MOUTH ONCE DAILY.  . metFORMIN (GLUCOPHAGE) 500 MG tablet TAKE (1) TABLET BY MOUTH TWICE DAILY. (Patient taking differently: Take 500 mg by mouth 2 (two) times daily with a meal. TAKE (1) TABLET BY MOUTH TWICE DAILY.)  . Multiple Minerals-Vitamins (CALCIUM-MAGNESIUM-ZINC-D3 PO) Take 2 tablets by mouth 2 (two) times daily.  . potassium chloride SA (K-DUR,KLOR-CON) 20 MEQ tablet Take 1 tablet (20 mEq total) by mouth daily.  Marland Kitchen HYDROcodone-acetaminophen (NORCO/VICODIN) 5-325 MG tablet TAKE (1) TABLET BY MOUTH TWICE A DAY AS NEEDED. (Patient not taking: Reported on 03/06/2016)  . ketoconazole (NIZORAL) 2 % cream Apply 1 application topically 2 (two) times daily. (Patient not taking: Reported on 03/06/2016)  . oxyCODONE-acetaminophen (PERCOCET/ROXICET) 5-325 MG tablet Take 1-2 tablets by mouth every 4 (four) hours as needed for severe pain. (Patient not taking: Reported on 03/06/2016)  . [DISCONTINUED] nitrofurantoin, macrocrystal-monohydrate, (MACROBID) 100 MG capsule Take 1 capsule (  100 mg total) by mouth 2 (two) times daily.   No facility-administered encounter medications on file as of 03/06/2016.     Allergy:  Allergies  Allergen Reactions  . Celebrex [Celecoxib]     GI upset   . Doxycycline Nausea Only    Gi upset  . Lodine [Etodolac] Nausea Only    Gi upset  . Nabumetone     (relafen)  . Tetracycline   . Xarelto [Rivaroxaban] Hives    Social Hx:   Social History   Social  History  . Marital status: Married    Spouse name: N/A  . Number of children: N/A  . Years of education: N/A   Occupational History  . Not on file.   Social History Main Topics  . Smoking status: Never Smoker  . Smokeless tobacco: Never Used  . Alcohol use No  . Drug use: No  . Sexual activity: Not on file   Other Topics Concern  . Not on file   Social History Narrative  . No narrative on file    Past Surgical Hx:  Past Surgical History:  Procedure Laterality Date  . ABDOMINAL HYSTERECTOMY     complete  . BACK SURGERY     upper  with plate frontal fusion and lower x 2  . COLONOSCOPY    . KNEE SURGERY Left    arthroscopy  . TOE SURGERY Left   . TONSILLECTOMY    . TOTAL HIP ARTHROPLASTY Left 10/27/2013   Procedure: LEFT TOTAL HIP ARTHROPLASTY;  Surgeon: Tobi Bastos, MD;  Location: [facility];  Service: Orthopedics;  Laterality: Left;  . TOTAL KNEE ARTHROPLASTY Right   . VULVA /PERINEUM BIOPSY  02/22/2016  . VULVECTOMY Right 02/27/2016   Procedure: RADICAL RIGHT VULVECTOMY WITH RIGHT INGUINAL LYMPH NODE DISSECTION;  Surgeon: Nancy Marus, MD;  Location: [facility];  Service: Gynecology;  Laterality: Right;    Past Medical Hx:  Past Medical History:  Diagnosis Date  . Aortic stenosis, mild   . Aortic valve stenosis, mild   . Arthritis   . Cancer ([place])    vulvar   . Complication of anesthesia   . Diabetes mellitus without complication ([place])    type 2  . Heart murmur   . History of blood transfusion age 17 or 27 months old  . Hypertension   . IFG (impaired fasting glucose)   . PONV (postoperative nausea and vomiting)    years ago with back surgery-nausea  . Vitamin D deficiency     Past Gynecological History:  Remote hx of hysterectomy for fibroids. No hx of abnromal paps (last pap remote) No LMP recorded. Patient has had a hysterectomy.  Family Hx:  Family History  Problem Relation Age of Onset  . Hypertension Mother   . Hypertension Father   . Cancer Sister      Breast  . Hypertension Brother     Review of Systems:  Constitutional  Feels well,    ENT Normal appearing ears and nares bilaterally Skin/Breast  + vulvar pruritis. Cardiovascular  No chest pain, shortness of breath, or edema  Pulmonary  No cough or wheeze.  Gastro Intestinal  No nausea, vomitting, or diarrhoea. No bright red blood per rectum, no abdominal pain, change in bowel movement, or constipation.  Genito Urinary  No frequency, urgency, dysuria, no bleeding Musculo Skeletal  No myalgia, arthralgia, joint swelling or pain  Neurologic  No weakness, numbness, change in gait,  Psychology  No depression, anxiety, insomnia.  Vitals:  Blood pressure 124/79, pulse (!) 103, temperature 97.9 F (36.6 C), temperature source Oral, resp. rate 18, height 5\' 3"  (1.6 m), weight 187 lb 12.8 oz (85.2 kg), SpO2 100 %.  Physical Exam: WD in NAD Neck  Supple NROM, without any enlargements.  Lymph Node Survey No cervical supraclavicular or inguinal adenopathy. Right inguinal drain with 15cc serous fluid. Incision in groin clean and dry (no induration or erythema). Cardiovascular  Pulse normal rate, regularity and rhythm. S1 and S2 normal.  Lungs  Clear to auscultation bilateraly, without wheezes/crackles/rhonchi. Good air movement.  Skin  No rash/lesions/breakdown  Psychiatry  Alert and oriented to person, place, and time  Abdomen  Normoactive bowel sounds, abdomen soft, non-tender and obese without evidence of hernia.  Back No CVA tenderness Genito Urinary  Vulva/vagina: minimal edema on vulva. Irritation of left vulva where it approximates with contralateral incision line.  Bladder/urethra:  No lesions or masses, well supported bladder  Vagina: atrophic, no lesions  Cervix: surgically absent  Uterus: surgically absen  Adnexa: no palpable masses. Rectal  deferred Extremities  No bilateral cyanosis, clubbing or edema.   30 minutes of direct face to face counseling  time was spent with the patient. This included discussion about prognosis, therapy recommendations and postoperative side effects and are beyond the scope of routine postoperative care.   Donaciano Eva, MD  03/06/2016, 3:03 PM

## 2016-03-06 NOTE — Patient Instructions (Signed)
Continue to measure and record drain output daily. Wipe neosporin ointment on vulva after toiletting.

## 2016-03-08 NOTE — Progress Notes (Signed)
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 Lymph nodes, regional resection, right inguinal - METASTATIC CARCINOMA IN 1 OF 6 LYMPH NODES (1/6). 2. Vulva, excision, right - INVASIVE KERATINIZING SQUAMOUS CELL CARCINOMA, MODERATELY DIFFERENTIATED, SPANNING 2.1 CM WITH A DEPTH OF INVASION OF 0.6 CM. - THE SURGICAL RESECTION MARGIN ARE NEGATIVE FOR CARCINOMA. - DIFFUSE LICHEN SCLEROSUS ET ATROPHICUS. - SEE ONCOLOGY TABLE BELOW.  02/22/16 Diagnosis Labium, biopsy, right mid - INVASIVE SQUAMOUS CELL CARCINOMA.  Past/Anticipated interventions by Gyn/Onc surgery, if any: 02/27/16 - Procedure: RADICAL RIGHT VULVECTOMY WITH RIGHT INGUINAL LYMPH NODE DISSECTION;  Surgeon: Nancy Marus, MD   Past/Anticipated interventions by medical oncology, if any: no  Weight changes, if any: yes reports she has lost 7 lbs since surgery.  She reports having a good appetite.  Bowel/Bladder complaints, if any: no  Nausea/Vomiting, if any: no  Pain issues, if any:  Yes - reports having a "raw spot" in her vaginal area.  She is using neosporin on it.  She reports it is painful when she is up walking around.  SAFETY ISSUES:  Prior radiation? no  Pacemaker/ICD? no  Possible current pregnancy? no  Is the patient on methotrexate? no  Current Complaints / other details:  Dr. Denman George is recommending adjuvant radiation to the right groin after she has healed from her lymphadenectomy.  Patient is here with her husband and son.  She reports having a small amount of drainage in her right groin were her drain was.  BP 129/74 (BP Location: Right Arm, Patient Position: Sitting)   Pulse (!) 107   Temp 99.3 F (37.4 C) (Oral)   Ht 5' 3.5" (1.613 m)   Wt 190 lb (86.2 kg)   SpO2 99%   BMI 33.13 kg/m    Wt  Readings from Last 3 Encounters:  03/13/16 190 lb (86.2 kg)  03/11/16 189 lb 6.4 oz (85.9 kg)  03/06/16 187 lb 12.8 oz (85.2 kg)

## 2016-03-11 ENCOUNTER — Encounter: Payer: Self-pay | Admitting: Gynecology

## 2016-03-11 ENCOUNTER — Ambulatory Visit: Payer: PPO | Attending: Gynecology | Admitting: Gynecology

## 2016-03-11 VITALS — BP 117/62 | HR 90 | Temp 98.2°F | Resp 18 | Ht 63.0 in | Wt 189.4 lb

## 2016-03-11 DIAGNOSIS — E559 Vitamin D deficiency, unspecified: Secondary | ICD-10-CM | POA: Insufficient documentation

## 2016-03-11 DIAGNOSIS — R011 Cardiac murmur, unspecified: Secondary | ICD-10-CM | POA: Insufficient documentation

## 2016-03-11 DIAGNOSIS — Z881 Allergy status to other antibiotic agents status: Secondary | ICD-10-CM | POA: Diagnosis not present

## 2016-03-11 DIAGNOSIS — Z90710 Acquired absence of both cervix and uterus: Secondary | ICD-10-CM | POA: Diagnosis not present

## 2016-03-11 DIAGNOSIS — Z803 Family history of malignant neoplasm of breast: Secondary | ICD-10-CM | POA: Diagnosis not present

## 2016-03-11 DIAGNOSIS — C774 Secondary and unspecified malignant neoplasm of inguinal and lower limb lymph nodes: Secondary | ICD-10-CM | POA: Diagnosis not present

## 2016-03-11 DIAGNOSIS — Z938 Other artificial opening status: Secondary | ICD-10-CM | POA: Insufficient documentation

## 2016-03-11 DIAGNOSIS — Z96642 Presence of left artificial hip joint: Secondary | ICD-10-CM | POA: Insufficient documentation

## 2016-03-11 DIAGNOSIS — Z7984 Long term (current) use of oral hypoglycemic drugs: Secondary | ICD-10-CM | POA: Diagnosis not present

## 2016-03-11 DIAGNOSIS — Z9889 Other specified postprocedural states: Secondary | ICD-10-CM | POA: Insufficient documentation

## 2016-03-11 DIAGNOSIS — M1990 Unspecified osteoarthritis, unspecified site: Secondary | ICD-10-CM | POA: Insufficient documentation

## 2016-03-11 DIAGNOSIS — Z96651 Presence of right artificial knee joint: Secondary | ICD-10-CM | POA: Diagnosis not present

## 2016-03-11 DIAGNOSIS — C519 Malignant neoplasm of vulva, unspecified: Secondary | ICD-10-CM

## 2016-03-11 DIAGNOSIS — Z888 Allergy status to other drugs, medicaments and biological substances status: Secondary | ICD-10-CM | POA: Diagnosis not present

## 2016-03-11 DIAGNOSIS — Z8249 Family history of ischemic heart disease and other diseases of the circulatory system: Secondary | ICD-10-CM | POA: Diagnosis not present

## 2016-03-11 DIAGNOSIS — Z7982 Long term (current) use of aspirin: Secondary | ICD-10-CM | POA: Diagnosis not present

## 2016-03-11 DIAGNOSIS — I350 Nonrheumatic aortic (valve) stenosis: Secondary | ICD-10-CM | POA: Diagnosis not present

## 2016-03-11 DIAGNOSIS — E119 Type 2 diabetes mellitus without complications: Secondary | ICD-10-CM | POA: Insufficient documentation

## 2016-03-11 DIAGNOSIS — I10 Essential (primary) hypertension: Secondary | ICD-10-CM | POA: Diagnosis not present

## 2016-03-11 NOTE — Progress Notes (Signed)
Consult Note: Gyn-Onc   [name] 76 y.o. female  Chief Complaint  Patient presents with  . Vuvlar Cancer    Assessment :Status post right radical vulvectomy and right inguinal lymphadenectomy. Positive inguinal nodes. (Stage IIIa)  Plan: The patient's summary of wound drainage is reviewed and is low less 10 cc per day. Therefore the right inguinal drain is removed without difficulty and a dressing was applied.  This regard to her vulvar wound care of encouraged her to use a sitz baths twice a day and pat the area dry.  She scheduled to see Dr. Sondra Come on February 21 for consultation regarding radiation therapy to the right inguinal region.  She will return to see Dr. Denman George in approximately 2-3 weeks for further evaluation of her vulvar wound.  I reviewed the patient's clinical course with the patient her husband and son explaining the importance of treating the positive inguinal node with radiation therapy. Interval History:  Patient returns today for continuing follow-up. She has a right inguinal drain in place. Review of her drainage over the past week shows that she's had approximately 10 cc per day. She does note some pain in her vulva with a minimal amount of drainage and no bleeding. HPI: patient underwent a modified radical activity and right inguinal lymphadenectomy on 02/27/2016. (Stage III a prescription for carcinoma of the right vulva) final pathology showed negative margins with a depth of invasion of 6 mm. 1 of 6 inguinal femoral lymph nodes was positive for metastatic disease. Subsequently the patient's had an abdominal pelvic CT scan which did not demonstrate any evidence of metastatic disease.  Review of Systems:10 point review of systems is negative except as noted in interval history.   Vitals: Blood pressure 117/62, pulse 90, temperature 98.2 F (36.8 C), temperature source Oral, resp. rate 18, height 5\' 3"  (1.6 m), weight 189 lb 6.4 oz (85.9 kg), SpO2 100  %.  Physical Exam: General : The patient is a healthy woman in no acute distress.  HEENT: normocephalic, extraoccular movements normal; neck is supple without thyromegally  Lynphnodes: Supraclavicular and inguinal nodes not enlarged  Abdomen: Soft, non-tender, no ascites, no organomegally, no masses, no hernias   Patient has a panniculus. The right drain is intact with no evidence of erythema. Pelvic:  EGBUS:  status post right vulvectomy. The incision is intact. There is some tenderness at the apex and to the left of the incision at approximately 1 to 2:00    Lower extremities: No edema or varicosities. Normal range of motion      Allergies  Allergen Reactions  . Celebrex [Celecoxib]     GI upset   . Doxycycline Nausea Only    Gi upset  . Lodine [Etodolac] Nausea Only    Gi upset  . Nabumetone     (relafen)  . Tetracycline   . Xarelto [Rivaroxaban] Hives    Past Medical History:  Diagnosis Date  . Aortic stenosis, mild   . Aortic valve stenosis, mild   . Arthritis   . Cancer ([place])    vulvar   . Complication of anesthesia   . Diabetes mellitus without complication ([place])    type 2  . Heart murmur   . History of blood transfusion age 80 or 25 months old  . Hypertension   . IFG (impaired fasting glucose)   . PONV (postoperative nausea and vomiting)    years ago with back surgery-nausea  . Vitamin D deficiency     Past Surgical  History:  Procedure Laterality Date  . ABDOMINAL HYSTERECTOMY     complete  . BACK SURGERY     upper  with plate frontal fusion and lower x 2  . COLONOSCOPY    . KNEE SURGERY Left    arthroscopy  . TOE SURGERY Left   . TONSILLECTOMY    . TOTAL HIP ARTHROPLASTY Left 10/27/2013   Procedure: LEFT TOTAL HIP ARTHROPLASTY;  Surgeon: Tobi Bastos, MD;  Location: [facility];  Service: Orthopedics;  Laterality: Left;  . TOTAL KNEE ARTHROPLASTY Right   . VULVA /PERINEUM BIOPSY  02/22/2016  . VULVECTOMY Right 02/27/2016   Procedure: RADICAL RIGHT  VULVECTOMY WITH RIGHT INGUINAL LYMPH NODE DISSECTION;  Surgeon: Nancy Marus, MD;  Location: [facility];  Service: Gynecology;  Laterality: Right;    Current Outpatient Prescriptions  Medication Sig Dispense Refill  . aspirin 81 MG tablet Take 81 mg by mouth daily.    Marland Kitchen CINNAMON PO Take 2 capsules by mouth daily. 2,000 mg daily     . furosemide (LASIX) 40 MG tablet TAKE ONE TABLET BY MOUTH ONCE DAILY. 90 tablet 1  . gabapentin (NEURONTIN) 100 MG capsule TAKE 1 CAPSULE BY MOUTH THREE TIMES A DAY. (Patient taking differently: TAKE 1 CAPSULE BY MOUTH TWO TIMES A DAY.) 270 capsule 0  . HYDROcodone-acetaminophen (NORCO/VICODIN) 5-325 MG tablet TAKE (1) TABLET BY MOUTH TWICE A DAY AS NEEDED. 60 tablet 0  . losartan (COZAAR) 50 MG tablet TAKE ONE TABLET BY MOUTH ONCE DAILY. 90 tablet 1  . metFORMIN (GLUCOPHAGE) 500 MG tablet TAKE (1) TABLET BY MOUTH TWICE DAILY. (Patient taking differently: Take 500 mg by mouth 2 (two) times daily with a meal. TAKE (1) TABLET BY MOUTH TWICE DAILY.) 180 tablet 5  . Multiple Minerals-Vitamins (CALCIUM-MAGNESIUM-ZINC-D3 PO) Take 2 tablets by mouth 2 (two) times daily.    . potassium chloride SA (K-DUR,KLOR-CON) 20 MEQ tablet Take 1 tablet (20 mEq total) by mouth daily. 90 tablet 3   No current facility-administered medications for this visit.     Social History   Social History  . Marital status: Married    Spouse name: N/A  . Number of children: N/A  . Years of education: N/A   Occupational History  . Not on file.   Social History Main Topics  . Smoking status: Never Smoker  . Smokeless tobacco: Never Used  . Alcohol use No  . Drug use: No  . Sexual activity: Not on file   Other Topics Concern  . Not on file   Social History Narrative  . No narrative on file    Family History  Problem Relation Age of Onset  . Hypertension Mother   . Hypertension Father   . Cancer Sister     Breast  . Hypertension Brother       Marti Sleigh,  MD 03/11/2016, 11:35 AM

## 2016-03-13 ENCOUNTER — Encounter: Payer: Self-pay | Admitting: Radiation Oncology

## 2016-03-13 ENCOUNTER — Ambulatory Visit
Admission: RE | Admit: 2016-03-13 | Discharge: 2016-03-13 | Disposition: A | Discharge status: Home / Self Care | Payer: PPO | Source: Ambulatory Visit | Attending: Radiation Oncology | Admitting: Radiation Oncology

## 2016-03-13 ENCOUNTER — Ambulatory Visit: Payer: PPO | Admitting: Radiation Oncology

## 2016-03-13 VITALS — BP 129/74 | HR 107 | Temp 99.3°F | Ht 63.5 in | Wt 190.0 lb

## 2016-03-13 DIAGNOSIS — Z96651 Presence of right artificial knee joint: Secondary | ICD-10-CM | POA: Diagnosis not present

## 2016-03-13 DIAGNOSIS — Z808 Family history of malignant neoplasm of other organs or systems: Secondary | ICD-10-CM | POA: Diagnosis not present

## 2016-03-13 DIAGNOSIS — E559 Vitamin D deficiency, unspecified: Secondary | ICD-10-CM | POA: Diagnosis not present

## 2016-03-13 DIAGNOSIS — C519 Malignant neoplasm of vulva, unspecified: Secondary | ICD-10-CM

## 2016-03-13 DIAGNOSIS — E119 Type 2 diabetes mellitus without complications: Secondary | ICD-10-CM | POA: Diagnosis not present

## 2016-03-13 DIAGNOSIS — Z888 Allergy status to other drugs, medicaments and biological substances status: Secondary | ICD-10-CM | POA: Insufficient documentation

## 2016-03-13 DIAGNOSIS — Z90710 Acquired absence of both cervix and uterus: Secondary | ICD-10-CM | POA: Insufficient documentation

## 2016-03-13 DIAGNOSIS — Z8249 Family history of ischemic heart disease and other diseases of the circulatory system: Secondary | ICD-10-CM | POA: Diagnosis not present

## 2016-03-13 DIAGNOSIS — Z7982 Long term (current) use of aspirin: Secondary | ICD-10-CM | POA: Insufficient documentation

## 2016-03-13 DIAGNOSIS — M1990 Unspecified osteoarthritis, unspecified site: Secondary | ICD-10-CM | POA: Insufficient documentation

## 2016-03-13 DIAGNOSIS — D509 Iron deficiency anemia, unspecified: Secondary | ICD-10-CM | POA: Diagnosis not present

## 2016-03-13 DIAGNOSIS — Z96642 Presence of left artificial hip joint: Secondary | ICD-10-CM | POA: Insufficient documentation

## 2016-03-13 DIAGNOSIS — I10 Essential (primary) hypertension: Secondary | ICD-10-CM | POA: Insufficient documentation

## 2016-03-13 DIAGNOSIS — I350 Nonrheumatic aortic (valve) stenosis: Secondary | ICD-10-CM | POA: Diagnosis not present

## 2016-03-13 DIAGNOSIS — Z79899 Other long term (current) drug therapy: Secondary | ICD-10-CM | POA: Diagnosis not present

## 2016-03-13 DIAGNOSIS — Z881 Allergy status to other antibiotic agents status: Secondary | ICD-10-CM | POA: Diagnosis not present

## 2016-03-13 DIAGNOSIS — Z510 Encounter for antineoplastic radiation therapy: Secondary | ICD-10-CM | POA: Insufficient documentation

## 2016-03-13 DIAGNOSIS — Z7984 Long term (current) use of oral hypoglycemic drugs: Secondary | ICD-10-CM | POA: Insufficient documentation

## 2016-03-13 DIAGNOSIS — Z9079 Acquired absence of other genital organ(s): Secondary | ICD-10-CM | POA: Diagnosis not present

## 2016-03-13 NOTE — Progress Notes (Signed)
Please see the Nurse Progress Note in the MD Initial Consult Encounter for this patient. 

## 2016-03-13 NOTE — Progress Notes (Signed)
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 female who started having pruritus of the vulva in August 2017. She was seen by Sumner Boast, NP who noted irritation with pale dry skin and no lesions. The patient was prescribed Estradiol. The pruritus persisted and became worse with occasional burning and tenderness. She noticed a nodule on the right labia and saw Sumner Boast, NP once again on 02/09/16 who noted a raised pale verrucous appearing lesion noted mid left labia approx 0.5-1 cm in size. Multiple small ecchymotic areas noted on both labia most likely from forceful rubbing due to itching. Only one very small area or irritation noted near introitus. A tiny slightly raised area noted on the right labia." Her PCP ordered Nizoral cream and referred the patient to Dr. Elonda Husky of Ob/Gyn. The patient saw Dr. Elonda Husky on 02/10/16 who noted a right labial lesion consistent with SCC involving the clitoral hood and urethra. No biopsy was taken at that time.  The patient was subsequently referred to Dr. Denman George on 02/22/16. Physical exam of the vulva/vagina revealed a "1.5cm round, raised nodular lesion (mobile, not fixed) >2cm from midline and >2cm from urethral meatus. The surrounding vulvar skin has decreased pigmentation and changes consistent with lichen sclerosis." No inguinal adenopathy was appreciated. Biopsy by Dr. Denman George of the right mid labium revealed invasive squamous cell carcinoma. CT scan of the abd/pelvis on 02/23/16 revealed mild asymmetric soft tissue thickening in the right vulvar/perineal region likely corresponding to the patient's known  vulvar cancer and no evidence of metastatic disease.  Dr. Denman George recommended right radical vulvectomy and right inguinal lymphadenectomy and surgery was performed by Dr. Alycia Rossetti on 02/27/16. Excision of the right vulva revealed moderately differentiated invasive keratinizing squamous cell carcinoma measuring 2.1 cm with a depth of invasion of 0.6 cm, clear surgical margins, and diffuse lichen sclerosus et atrophicus. 1 out of 6 right inguinal resection lymph nodes were positive for metastatic carcinoma. No evidence of lymphovascular space invasion or extracapsular extension within the involved lymph node.  The patient followed up with Dr. Denman George on 03/06/16 who recommended a CT scan of the chest for staging purposes and adjuvant radiation to the right groin. The patient presents today with her husband and son.  Of note: the patient is post hysterectomy.  PREVIOUS RADIATION THERAPY: No  PAST MEDICAL HISTORY:  has a past medical history of Aortic stenosis, mild; Aortic valve stenosis, mild; Arthritis; Cancer ([place]); Complication of anesthesia; Diabetes mellitus without complication ([place]); Heart murmur; History of blood transfusion (age 51 or 77 months old); Hypertension; IFG (impaired fasting glucose); PONV (postoperative nausea and vomiting); and Vitamin D deficiency.    PAST SURGICAL HISTORY: Past Surgical History:  Procedure Laterality Date  . ABDOMINAL HYSTERECTOMY     complete  . BACK SURGERY     upper  with plate frontal fusion and lower x 2  . COLONOSCOPY    . KNEE SURGERY Left    arthroscopy  . TOE SURGERY Left   . TONSILLECTOMY    . TOTAL HIP ARTHROPLASTY Left 10/27/2013   Procedure: LEFT TOTAL HIP ARTHROPLASTY;  Surgeon: Tobi Bastos, MD;  Location: [facility];  Service: Orthopedics;  Laterality: Left;  . TOTAL KNEE ARTHROPLASTY Right   . VULVA /PERINEUM BIOPSY  02/22/2016  . VULVECTOMY Right 02/27/2016   Procedure: RADICAL RIGHT VULVECTOMY WITH RIGHT INGUINAL LYMPH NODE DISSECTION;   Surgeon: Nancy Marus, MD;  Location: [facility];  Service: Gynecology;  Laterality: Right;    FAMILY HISTORY: family history includes Cancer in her sister; Hypertension in her brother, father, and mother; Throat cancer in her father.  SOCIAL HISTORY:  reports that she has never smoked. She has never used smokeless tobacco. She reports that she does not drink alcohol or use drugs.  ALLERGIES: Celebrex [celecoxib]; Doxycycline; Lodine [etodolac]; Nabumetone; Tetracycline; and Xarelto [rivaroxaban]  MEDICATIONS:  Current Outpatient Prescriptions  Medication Sig Dispense Refill  . aspirin 81 MG tablet Take 81 mg by mouth daily.    Marland Kitchen CINNAMON PO Take 2 capsules by mouth daily. 2,000 mg daily     . furosemide (LASIX) 40 MG tablet TAKE ONE TABLET BY MOUTH ONCE DAILY. 90 tablet 1  . gabapentin (NEURONTIN) 100 MG capsule TAKE 1 CAPSULE BY MOUTH THREE TIMES A DAY. (Patient taking differently: TAKE 1 CAPSULE BY MOUTH TWO TIMES A DAY.) 270 capsule 0  . HYDROcodone-acetaminophen (NORCO/VICODIN) 5-325 MG tablet TAKE (1) TABLET BY MOUTH TWICE A DAY AS NEEDED. 60 tablet 0  . losartan (COZAAR) 50 MG tablet TAKE ONE TABLET BY MOUTH ONCE DAILY. 90 tablet 1  . metFORMIN (GLUCOPHAGE) 500 MG tablet TAKE (1) TABLET BY MOUTH TWICE DAILY. (Patient taking differently: Take 500 mg by mouth 2 (two) times daily with a meal. TAKE (1) TABLET BY MOUTH TWICE DAILY.) 180 tablet 5  . Multiple Minerals-Vitamins (CALCIUM-MAGNESIUM-ZINC-D3 PO) Take 2 tablets by mouth 2 (two) times daily.    . potassium chloride SA (K-DUR,KLOR-CON) 20 MEQ tablet Take 1 tablet (20 mEq total) by mouth daily. 90 tablet 3   No current facility-administered medications for this encounter.     REVIEW OF SYSTEMS:  A 15 point review of systems is documented in the electronic medical record. This was obtained by the nursing staff. However, I reviewed this with the patient to discuss relevant findings and make appropriate changes.  Pertinent items noted in  HPI and remainder of comprehensive ROS otherwise negative.   The patient reports pain in her Perineum area from a "raw spot." She is using neosporin in this area. She reports pain when she sits a certain way or walks. She reports a small amount of drainage in her right groin from where her drain was. Denies numbness of her legs. Denies urinary or bowel issues. She denies any swelling in her right leg   PHYSICAL EXAM:  height is 5' 3.5" (1.613 m) and weight is 190 lb (86.2 kg). Her oral temperature is 99.3 F (37.4 C). Her blood pressure is 129/74 and her pulse is 107 (abnormal). Her oxygen saturation is 99%.   General: Alert and oriented, in no acute distress HEENT: Head is normocephalic. Extraocular movements are intact. Oropharynx is clear. Neck: Neck is supple, no palpable cervical or supraclavicular lymphadenopathy. Heart: Regular in rate and rhythm with no murmurs, rubs, or gallops. Chest: Clear to auscultation bilaterally, with no rhonchi, wheezes, or rales. Abdomen: Soft, nontender, nondistended, with no rigidity or guarding.  Significant pannus Extremities: No cyanosis or edema. Lymphatics: see Neck Exam. No axillary adenopathy. Skin: No concerning lesions. Scar along her right knee from a prior knee replacement. Scar along her left pelvic from a prior hip replacement. Musculoskeletal: symmetric strength and muscle tone  throughout. Neurologic: Cranial nerves II through XII are grossly intact. No obvious focalities. Speech is fluent. Coordination is intact. Psychiatric: Judgment and insight are intact. Affect is appropriate. Pelvic: I performed a limited pelvic exam. Right inguinal scar healing well without sign of drainage or infection. Surgical glue remains in place. Right vulva surgically absent, no sign of drainage or infection. Erythema in the medial aspect of her left labia majora.  ECOG = 2  LABORATORY DATA:  Lab Results  Component Value Date   WBC 7.4 02/28/2016   HGB 7.6 (L)  02/28/2016   HCT 24.5 (L) 02/28/2016   MCV 79.3 02/28/2016   PLT 208 02/28/2016   NEUTROABS 5.8 02/23/2016   Lab Results  Component Value Date   NA 140 02/28/2016   K 4.8 02/28/2016   CL 107 02/28/2016   CO2 26 02/28/2016   GLUCOSE 143 (H) 02/28/2016   CREATININE 0.80 02/28/2016   CALCIUM 8.6 (L) 02/28/2016      RADIOGRAPHY: Ct Abdomen Pelvis W Contrast  Result Date: 02/23/2016 CLINICAL DATA:  Newly diagnosed vulvar cancer in January 2018, evaluate for metastatic disease, status post hysterectomy EXAM: CT ABDOMEN AND PELVIS WITH CONTRAST TECHNIQUE: Multidetector CT imaging of the abdomen and pelvis was performed using the standard protocol following bolus administration of intravenous contrast. CONTRAST:  160mL ISOVUE-300 IOPAMIDOL (ISOVUE-300) INJECTION 61% COMPARISON:  None. FINDINGS: Lower chest: Lung bases are essentially clear. Cardiomegaly. Coronary atherosclerosis in the LAD. Atherosclerotic calcifications of the aortic valve/root. Hepatobiliary: Liver is within normal limits. No suspicious/enhancing hepatic lesions. Gallbladder is unremarkable. No intrahepatic or extrahepatic ductal dilatation. Pancreas: Within normal limits. Spleen: 10 mm probable cyst in the lateral spleen (series 2/image 19). Adrenals/Urinary Tract: Adrenal glands are within normal limits. Three cyst in the right kidney, measuring up to 1.7 cm in the posterior right lower pole (series 7/ image 26), benign (Bosniak I). No enhancing renal lesions. Punctate nonobstructing left lower pole renal calculus (series 2/ image 44). No ureteral or bladder calculi. No hydronephrosis. Bladder is underdistended and poorly evaluated. Stomach/Bowel: Stomach is within normal limits. No evidence of bowel obstruction. Appendix is not discretely visualized. Mild sigmoid diverticulosis, without evidence of diverticulitis. Vascular/Lymphatic: No evidence of abdominal aortic aneurysm. No suspicious abdominopelvic lymphadenopathy. Reproductive:  Status post hysterectomy and bilateral salpingo-oophorectomy. Mild asymmetric soft tissue thickening in the right vulvar/perineal region (series 2/ image 101), likely corresponding to the patient's known vulvar cancer. Other: No abdominopelvic ascites. Musculoskeletal: Degenerative changes of the visualized thoracolumbar spine. Left hip arthroplasty. IMPRESSION: Mild asymmetric soft tissue thickening in the right vulvar/perineal region, likely corresponding to the patient's known vulvar cancer, poorly evaluated on CT. No evidence of metastatic disease. Status post hysterectomy. Electronically Signed   By: Julian Hy M.D.   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 Patient would be at risk for recurrence along the right inguinal area and would agree with Dr. Serita Grit recommendations for postoperative adjuvant treatment.  I spoke to the patient and family today regarding her diagnosis and options for treatment. We discussed the role of radiation in reducing the risk of local recurrence along  the right inguinal region. We discussed the process of CT simulation and the placement tattoos. We discussed 5 1/2 weeks of treatment as an outpatient. . We discussed the low likelihood of secondary malignancies. We discussed the possible side effects including but not limited to skin redness, fatigue, permanent skin darkening, urinary symptoms, and  lymphedema of the right lower extremity.  PLAN: The patient signed a consent form and a copy was placed in her medical chart. The patient's surgery was on 02/27/16, approximately 2 weeks ago. The patient will return the week of March 12th for CT simulation (this is approximately 5 weeks after surgery to allow further healing). She will begin treatment the following week (6 weeks postop). We will contact her with the CT simulation schedule. She will be scheduled for a chest CT scan to  complete her staging workup. I'll ask the patient whether she would prefer to have this at [facility] or here in [place].     ------------------------------------------------  Blair Promise, PhD, MD  This document serves as a record of services personally performed by Gery Pray, MD. It was created on his behalf by Darcus Austin, a trained medical scribe. The creation of this record is based on the scribe's personal observations and the provider's statements to them. This document has been checked and approved by the attending provider.

## 2016-03-14 ENCOUNTER — Other Ambulatory Visit: Payer: Self-pay | Admitting: Family Medicine

## 2016-03-18 ENCOUNTER — Other Ambulatory Visit: Payer: Self-pay | Admitting: Radiation Oncology

## 2016-03-18 DIAGNOSIS — C519 Malignant neoplasm of vulva, unspecified: Secondary | ICD-10-CM

## 2016-03-29 ENCOUNTER — Telehealth: Payer: Self-pay

## 2016-03-29 NOTE — Telephone Encounter (Signed)
Gave [name] her appointment for a CT chest at [facility] on Monday 04-01-16 at 1130 am. Clear liquids only 4 hrs prior to scan.  Arrive to check in at 1115. Melissa Collyer verbalized understanding.

## 2016-04-01 ENCOUNTER — Ambulatory Visit (HOSPITAL_COMMUNITY)
Admission: RE | Admit: 2016-04-01 | Discharge: 2016-04-01 | Disposition: A | Discharge status: Home / Self Care | Payer: PPO | Source: Ambulatory Visit | Attending: Radiation Oncology | Admitting: Radiation Oncology

## 2016-04-01 ENCOUNTER — Ambulatory Visit (HOSPITAL_COMMUNITY): Payer: PPO

## 2016-04-01 DIAGNOSIS — I2510 Atherosclerotic heart disease of native coronary artery without angina pectoris: Secondary | ICD-10-CM | POA: Insufficient documentation

## 2016-04-01 DIAGNOSIS — R918 Other nonspecific abnormal finding of lung field: Secondary | ICD-10-CM | POA: Diagnosis not present

## 2016-04-01 DIAGNOSIS — E041 Nontoxic single thyroid nodule: Secondary | ICD-10-CM | POA: Diagnosis not present

## 2016-04-01 DIAGNOSIS — I700 Atherosclerosis of aorta: Secondary | ICD-10-CM | POA: Diagnosis not present

## 2016-04-01 DIAGNOSIS — C519 Malignant neoplasm of vulva, unspecified: Secondary | ICD-10-CM | POA: Insufficient documentation

## 2016-04-01 DIAGNOSIS — D7389 Other diseases of spleen: Secondary | ICD-10-CM | POA: Insufficient documentation

## 2016-04-01 DIAGNOSIS — R911 Solitary pulmonary nodule: Secondary | ICD-10-CM | POA: Diagnosis not present

## 2016-04-01 MED ORDER — IOPAMIDOL (ISOVUE-300) INJECTION 61%
75.0000 mL | Freq: Once | INTRAVENOUS | Status: AC | PRN
  Administered 2016-04-01: 75 mL via INTRAVENOUS

## 2016-04-02 ENCOUNTER — Encounter: Payer: Self-pay | Admitting: Gynecologic Oncology

## 2016-04-02 ENCOUNTER — Ambulatory Visit: Admission: RE | Admit: 2016-04-02 | Payer: PPO | Source: Ambulatory Visit | Admitting: Radiation Oncology

## 2016-04-02 ENCOUNTER — Ambulatory Visit: Payer: PPO | Attending: Gynecologic Oncology | Admitting: Gynecologic Oncology

## 2016-04-02 VITALS — BP 148/79 | HR 83 | Temp 97.9°F | Resp 20 | Wt 194.4 lb

## 2016-04-02 DIAGNOSIS — R918 Other nonspecific abnormal finding of lung field: Secondary | ICD-10-CM | POA: Diagnosis not present

## 2016-04-02 DIAGNOSIS — Z96642 Presence of left artificial hip joint: Secondary | ICD-10-CM | POA: Insufficient documentation

## 2016-04-02 DIAGNOSIS — Z803 Family history of malignant neoplasm of breast: Secondary | ICD-10-CM | POA: Insufficient documentation

## 2016-04-02 DIAGNOSIS — Z808 Family history of malignant neoplasm of other organs or systems: Secondary | ICD-10-CM | POA: Diagnosis not present

## 2016-04-02 DIAGNOSIS — Z79899 Other long term (current) drug therapy: Secondary | ICD-10-CM | POA: Diagnosis not present

## 2016-04-02 DIAGNOSIS — I10 Essential (primary) hypertension: Secondary | ICD-10-CM | POA: Insufficient documentation

## 2016-04-02 DIAGNOSIS — N7689 Other specified inflammation of vagina and vulva: Secondary | ICD-10-CM

## 2016-04-02 DIAGNOSIS — Z8249 Family history of ischemic heart disease and other diseases of the circulatory system: Secondary | ICD-10-CM | POA: Diagnosis not present

## 2016-04-02 DIAGNOSIS — E119 Type 2 diabetes mellitus without complications: Secondary | ICD-10-CM | POA: Insufficient documentation

## 2016-04-02 DIAGNOSIS — C519 Malignant neoplasm of vulva, unspecified: Secondary | ICD-10-CM | POA: Diagnosis not present

## 2016-04-02 DIAGNOSIS — Z96651 Presence of right artificial knee joint: Secondary | ICD-10-CM | POA: Insufficient documentation

## 2016-04-02 DIAGNOSIS — Z7982 Long term (current) use of aspirin: Secondary | ICD-10-CM | POA: Insufficient documentation

## 2016-04-02 DIAGNOSIS — Z7984 Long term (current) use of oral hypoglycemic drugs: Secondary | ICD-10-CM | POA: Insufficient documentation

## 2016-04-02 DIAGNOSIS — Z90710 Acquired absence of both cervix and uterus: Secondary | ICD-10-CM | POA: Insufficient documentation

## 2016-04-02 DIAGNOSIS — Z881 Allergy status to other antibiotic agents status: Secondary | ICD-10-CM | POA: Insufficient documentation

## 2016-04-02 DIAGNOSIS — C511 Malignant neoplasm of labium minus: Secondary | ICD-10-CM | POA: Diagnosis not present

## 2016-04-02 DIAGNOSIS — E559 Vitamin D deficiency, unspecified: Secondary | ICD-10-CM | POA: Insufficient documentation

## 2016-04-02 DIAGNOSIS — Z888 Allergy status to other drugs, medicaments and biological substances status: Secondary | ICD-10-CM | POA: Insufficient documentation

## 2016-04-02 NOTE — Patient Instructions (Signed)
Dr. Serita Grit office will call you with the results of the biopsy when available.

## 2016-04-02 NOTE — Progress Notes (Signed)
Consult Note: Gyn-Onc  Consult was requested by Dr. Elonda Husky for the evaluation of [name] 76 y.o. female   CC:  Chief Complaint  Patient presents with  . Vulvar Cancer    Assessment/Plan:  [name]  is a 76 y.o.  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   1. Radiation to groin, Discussed with Dr Sondra Come holding off radiation until bx back. 2. Nodules on CT scan - repeat CT in 3 months for follow-up.  3. Left vulvar lesion - likely secondary to irritation from suture material. Will follow-up today's biopsy. If positive, will consider surgery vs primary radiation to vulvar at same time as bilateral groin rads.[name]   HPI: [name] is a 76 year old woman who is seen in consultation at the request of Dr Elonda Husky for a right labial lesion.    The patient reports that she began feeling pruritis of the vulva in October, 2017. She was seen by her PCP who saw no lesion and prescribed estradiol.  The pruritis persisted and became worse and she began to feel a nodule in the area and therefore again saw her PCP followed by a referral to gynecologist, Dr Elonda Husky on 02/20/16. The nodular lesion was appreciated on the right vulva. No biopsy was taken at that time. Biopsy was performed on 02/22/16 and pathology from biopsy confirmed invasive SCC.   CT scan abdo/pelvis was negative for evidence of suspicious nodes or distant mets.  On 02/22/16 she underwent right radical vulvectomy and right inguinal lymphadenectomy with Dr Nancy Marus. Final pathology revealed: a 2.1cm moderately differentiated SCC of the right vulva. Margins were negative (closest was 3 o'clock, 93mm). Depth of invasion was 42mm. 1 of 6 inguinofemoral lymph nodes (right) was positive for metastatic disease.   Interval Hx:  Her JP was removed 2 weeks postop. She has had significant irritation of hte left anterior vulva since surgery. Extremely uncomfortable. No issues in  right leg or groin.  CT chest on 04/01/16 showed: Multiple pulmonary nodules as detailed above, generally in the 18mm and less average diameter range although with 1 right lower lobe subpleural nodule measuring about 8 mm in diameter. These likely warrant surveillance. Hypodense right thyroid nodule, 1.1 cm in long axis. Consider further evaluation with thyroid ultrasound. If patient is clinically hyperthyroid, consider nuclear medicine thyroid uptake and scan. Stable hypodense lateral splenic lesion, likely a cyst.  Current Meds:  Outpatient Encounter Prescriptions as of 04/02/2016  Medication Sig  . aspirin 81 MG tablet Take 81 mg by mouth daily.  . Cholecalciferol (VITAMIN D3 PO) Take 1,000 Units by mouth 2 (two) times daily.  [name] CINNAMON PO Take 2 capsules by mouth daily. 2,000 mg daily   . furosemide (LASIX) 40 MG tablet TAKE ONE TABLET BY MOUTH ONCE DAILY.  [name] gabapentin (NEURONTIN) 100 MG capsule TAKE 1 CAPSULE BY MOUTH THREE TIMES A DAY.  [name] HYDROcodone-acetaminophen (NORCO/VICODIN) 5-325 MG tablet TAKE (1) TABLET BY MOUTH TWICE A DAY AS NEEDED.  [name] losartan (COZAAR) 50 MG tablet TAKE ONE TABLET BY MOUTH ONCE DAILY.  . metFORMIN (GLUCOPHAGE) 500 MG tablet TAKE (1) TABLET BY MOUTH TWICE DAILY.  . Multiple Minerals-Vitamins (CALCIUM-MAGNESIUM-ZINC-D3 PO) Take 2 tablets by mouth 2 (two) times daily.  . potassium chloride SA (K-DUR,KLOR-CON) 20 MEQ tablet Take 1 tablet (20 mEq total) by mouth daily.   No facility-administered encounter medications on file as of 04/02/2016.     Allergy:  Allergies  Allergen Reactions  . Celebrex [Celecoxib]     GI upset   . Doxycycline Nausea Only    Gi upset  . Lodine [Etodolac] Nausea Only    Gi upset  . Nabumetone     (relafen)  . Tetracycline   . Xarelto [Rivaroxaban] Hives    Social Hx:   Social History   Social History  . Marital status: Married    Spouse name: N/A  . Number of children: 2  . Years of education: N/A   Occupational  History  . Not on file.   Social History Main Topics  . Smoking status: Never Smoker  . Smokeless tobacco: Never Used  . Alcohol use No  . Drug use: No  . Sexual activity: Not on file   Other Topics Concern  . Not on file   Social History Narrative  . No narrative on file    Past Surgical Hx:  Past Surgical History:  Procedure Laterality Date  . ABDOMINAL HYSTERECTOMY     complete  . BACK SURGERY     upper  with plate frontal fusion and lower x 2  . COLONOSCOPY    . KNEE SURGERY Left    arthroscopy  . TOE SURGERY Left   . TONSILLECTOMY    . TOTAL HIP ARTHROPLASTY Left 10/27/2013   Procedure: LEFT TOTAL HIP ARTHROPLASTY;  Surgeon: Tobi Bastos, MD;  Location: [facility];  Service: Orthopedics;  Laterality: Left;  . TOTAL KNEE ARTHROPLASTY Right   . VULVA /PERINEUM BIOPSY  02/22/2016  . VULVECTOMY Right 02/27/2016   Procedure: RADICAL RIGHT VULVECTOMY WITH RIGHT INGUINAL LYMPH NODE DISSECTION;  Surgeon: Nancy Marus, MD;  Location: [facility];  Service: Gynecology;  Laterality: Right;    Past Medical Hx:  Past Medical History:  Diagnosis Date  . Aortic stenosis, mild   . Aortic valve stenosis, mild   . Arthritis   . Cancer ([place])    vulvar   . Complication of anesthesia   . Diabetes mellitus without complication ([place])    type 2  . Heart murmur   . History of blood transfusion age 21 or 51 months old  . Hypertension   . IFG (impaired fasting glucose)   . PONV (postoperative nausea and vomiting)    years ago with back surgery-nausea  . Vitamin D deficiency     Past Gynecological History:  Remote hx of hysterectomy for fibroids. No hx of abnromal paps (last pap remote) No LMP recorded. Patient has had a hysterectomy.  Family Hx:  Family History  Problem Relation Age of Onset  . Hypertension Mother   . Hypertension Father   . Throat cancer Father   . Cancer Sister     Breast  . Hypertension Brother     Review of Systems:  Constitutional  Feels well,     ENT Normal appearing ears and nares bilaterally Skin/Breast  + vulvar pruritis. Cardiovascular  No chest pain, shortness of breath, or edema  Pulmonary  No cough or wheeze.  Gastro Intestinal  No nausea, vomitting, or diarrhoea. No bright red blood per rectum, no abdominal pain, change in bowel movement, or constipation.  Genito Urinary  No frequency, urgency, dysuria, no bleeding Musculo Skeletal  No myalgia, arthralgia, joint swelling or pain  Neurologic  No weakness, numbness, change in gait,  Psychology  No depression, anxiety, insomnia.   Vitals:  Blood pressure (!) 148/79, pulse 83, temperature 97.9 F (36.6 C), temperature source Oral, resp. rate 20, weight  194 lb 6.4 oz (88.2 kg), SpO2 99 %.  Physical Exam: WD in NAD Neck  Supple NROM, without any enlargements.  Lymph Node Survey No cervical supraclavicular or inguinal adenopathy. Right groin incision well healed. Cardiovascular  Pulse normal rate, regularity and rhythm. S1 and S2 normal.  Lungs  Clear to auscultation bilateraly, without wheezes/crackles/rhonchi. Good air movement.  Skin  No rash/lesions/breakdown  Psychiatry  Alert and oriented to person, place, and time  Abdomen  Normoactive bowel sounds, abdomen soft, non-tender and obese without evidence of hernia.  Back No CVA tenderness Genito Urinary  Vulva/vagina: minimal edema on vulva. Irritation of left vulva where it approximates with contralateral incision line. Slightly raised nodular area measuring 1cm on labia minora on left with leukoplakia (new).  Bladder/urethra:  No lesions or masses, well supported bladder  Vagina: atrophic, no lesions  Cervix: surgically absent  Uterus: surgically absen  Adnexa: no palpable masses. Rectal  deferred Extremities  No bilateral cyanosis, clubbing or edema.   PROCEDURE NOTE: Vulvar biopsy Patient provided verbal consent. Time out performed. Lesion prepped with betadine. 1cc of 2% lidocaine  infiltrated into lesion. 37mm punch taken. Hemostasis obtained with silver nitrate. EBL: <2QM Complications: none Specimen: left vulva for pathology  Donaciano Eva, MD  04/02/2016, 1:29 PM

## 2016-04-04 ENCOUNTER — Telehealth: Payer: Self-pay | Admitting: Gynecologic Oncology

## 2016-04-04 ENCOUNTER — Telehealth: Payer: Self-pay | Admitting: *Deleted

## 2016-04-04 NOTE — Telephone Encounter (Signed)
Informed patient of the result of invasive carcinoma on the left vulva.  Recommend excision with radical left vulvectomy.   We won't excised groin nodes on that side, but will extend the radiation to the right groin in addition to the left.  Surgery planned for Tuesday 04/09/16

## 2016-04-04 NOTE — Telephone Encounter (Signed)
Pt dropped off life line screening results. Results in folder in your office.

## 2016-04-05 ENCOUNTER — Encounter (HOSPITAL_COMMUNITY)
Admission: RE | Admit: 2016-04-05 | Discharge: 2016-04-05 | Disposition: A | Discharge status: Home / Self Care | Payer: PPO | Source: Ambulatory Visit | Attending: Gynecologic Oncology | Admitting: Gynecologic Oncology

## 2016-04-05 ENCOUNTER — Encounter (HOSPITAL_COMMUNITY): Payer: Self-pay

## 2016-04-05 DIAGNOSIS — Z01818 Encounter for other preprocedural examination: Secondary | ICD-10-CM | POA: Insufficient documentation

## 2016-04-05 DIAGNOSIS — C519 Malignant neoplasm of vulva, unspecified: Secondary | ICD-10-CM | POA: Diagnosis not present

## 2016-04-05 LAB — COMPREHENSIVE METABOLIC PANEL
ALBUMIN: 4.2 g/dL (ref 3.5–5.0)
ALT: 18 U/L (ref 14–54)
ANION GAP: 7 (ref 5–15)
AST: 24 U/L (ref 15–41)
Alkaline Phosphatase: 62 U/L (ref 38–126)
BUN: 19 mg/dL (ref 6–20)
CALCIUM: 10 mg/dL (ref 8.9–10.3)
CHLORIDE: 110 mmol/L (ref 101–111)
CO2: 29 mmol/L (ref 22–32)
Creatinine, Ser: 0.95 mg/dL (ref 0.44–1.00)
GFR calc non Af Amer: 57 mL/min — ABNORMAL LOW (ref >60)
Glucose, Bld: 107 mg/dL — ABNORMAL HIGH (ref 65–99)
Potassium: 4.7 mmol/L (ref 3.5–5.1)
Sodium: 146 mmol/L — ABNORMAL HIGH (ref 135–145)
Total Bilirubin: 0.5 mg/dL (ref 0.3–1.2)
Total Protein: 7.7 g/dL (ref 6.5–8.1)

## 2016-04-05 LAB — CBC WITH DIFFERENTIAL/PLATELET
BASOS ABS: 0 10*3/uL (ref 0.0–0.1)
BASOS PCT: 0 %
Eosinophils Absolute: 0.2 10*3/uL (ref 0.0–0.7)
Eosinophils Relative: 2 %
HCT: 30.3 % — ABNORMAL LOW (ref 36.0–46.0)
HEMOGLOBIN: 9 g/dL — AB (ref 12.0–15.0)
LYMPHS PCT: 23 %
Lymphs Abs: 1.9 10*3/uL (ref 0.7–4.0)
MCH: 22.4 pg — ABNORMAL LOW (ref 26.0–34.0)
MCHC: 29.7 g/dL — ABNORMAL LOW (ref 30.0–36.0)
MCV: 75.6 fL — AB (ref 78.0–100.0)
MONO ABS: 0.7 10*3/uL (ref 0.1–1.0)
Monocytes Relative: 8 %
NEUTROS ABS: 5.7 10*3/uL (ref 1.7–7.7)
NEUTROS PCT: 67 %
Platelets: 296 10*3/uL (ref 150–400)
RBC: 4.01 MIL/uL (ref 3.87–5.11)
RDW: 14.2 % (ref 11.5–15.5)
WBC: 8.5 10*3/uL (ref 4.0–10.5)

## 2016-04-05 LAB — URINALYSIS, ROUTINE W REFLEX MICROSCOPIC
BILIRUBIN URINE: NEGATIVE
Bacteria, UA: NONE SEEN
Glucose, UA: NEGATIVE mg/dL
KETONES UR: NEGATIVE mg/dL
Nitrite: NEGATIVE
Protein, ur: NEGATIVE mg/dL
SPECIFIC GRAVITY, URINE: 1.013 (ref 1.005–1.030)
pH: 5 (ref 5.0–8.0)

## 2016-04-05 LAB — GLUCOSE, CAPILLARY: Glucose-Capillary: 106 mg/dL — ABNORMAL HIGH (ref 65–99)

## 2016-04-05 NOTE — Progress Notes (Signed)
02/23/2016- noted in EPIC-EKG and Lab- HGA1C -5.4 10/31/2014-noted in EPIC-ECHO 04/01/2016-noted in EPIC- CT abd./pelvis

## 2016-04-05 NOTE — Patient Instructions (Addendum)
[name]  04/05/2016                [name]  04/05/2016   Your procedure is scheduled on: Tuesday 04/09/16  Report to [facility] Main  Entrance take [facility]  elevators to 3rd floor to  [place] at 100 PM.  Call this number if you have problems the morning of surgery [phone]   Remember: ONLY 1 PERSON MAY GO WITH YOU TO SHORT STAY TO GET  READY MORNING OF [place].     Eat a light diet the day before surgery.  Examples including soups, broths, toast, yogurt, mashed potatoes.  Things to avoid include carbonated beverages (fizzy beverages), raw fruits and raw vegetables, or beans.   If your bowels are filled with gas, your surgeon will have difficulty visualizing your pelvic organs which increases your surgical risks.    Do not eat any food after MIDNIGHT!  May have clear liquids from midnight until 9  AM morning of surgery-Nothing after  9  AM!    CLEAR LIQUID DIET   Foods Allowed                                                                     Foods Excluded  Coffee and tea, regular and decaf                             liquids that you cannot  Plain Jell-O in any flavor                                             see through such as: Fruit ices (not with fruit pulp)                                     milk, soups, orange juice  Iced Popsicles                                    All solid food                                    Cranberry, grape and apple juices Sports drinks like Gatorade Lightly seasoned clear broth or consume(fat free) Sugar, honey syrup  Sample Menu Breakfast                                Lunch                                     Supper Cranberry juice  Beef broth                            Chicken broth Jell-O                                     Grape juice                           Apple juice Coffee or tea                        Jell-O                                       Popsicle                                                Coffee or tea                        Coffee or tea  _____________________________________________________________________  How to Manage Your Diabetes Before and After Surgery  Why is it important to control my blood sugar before and after surgery? . Improving blood sugar levels before and after surgery helps healing and can limit problems. . A way of improving blood sugar control is eating a healthy diet by: o  Eating less sugar and carbohydrates o  Increasing activity/exercise o  Talking with your doctor about reaching your blood sugar goals . High blood sugars (greater than 180 mg/dL) can raise your risk of infections and slow your recovery, so you will need to focus on controlling your diabetes during the weeks before surgery. . Make sure that the doctor who takes care of your diabetes knows about your planned surgery including the date and location.  How do I manage my blood sugar before surgery? . Check your blood sugar at least 4 times a day, starting 2 days before surgery, to make sure that the level is not too high or low. o Check your blood sugar the morning of your surgery when you wake up and every 2 hours until you get to the Short Stay unit. . If your blood sugar is less than 70 mg/dL, you will need to treat for low blood sugar: o Do not take insulin. o Treat a low blood sugar (less than 70 mg/dL) with  cup of clear juice (cranberry or apple), 4 glucose tablets, OR glucose gel. o Recheck blood sugar in 15 minutes after treatment (to make sure it is greater than 70 mg/dL). If your blood sugar is not greater than 70 mg/dL on recheck, call [phone] for further instructions. . Report your blood sugar to the short stay nurse when you get to Short Stay.  . If you are admitted to the hospital after surgery: o Your blood sugar will be checked by the staff and you will probably be given insulin after surgery (instead of oral  diabetes medicines) to make sure you have good blood sugar levels. o The goal for blood sugar control after surgery is 80-180 mg/dL.   WHAT DO I DO  ABOUT MY DIABETES MEDICATION?  Take Metformin day before surgery, your morning dose only . Do not take oral diabetes medicines (pills) the morning of surgery.       Take these medicines the morning of surgery with A SIP OF WATER: Neurontin, Hydrocodone if needed                                You may not have any metal on your body including hair pins and              piercings  Do not wear jewelry, make-up, lotions, powders or perfumes, deodorant             Do not wear nail polish.  Do not shave  48 hours prior to surgery.              Men may shave face and neck.   Do not bring valuables to the hospital. [place].  Contacts, dentures or bridgework may not be worn into surgery.  Leave suitcase in the car. After surgery it may be brought to your room.                  Please read over the following fact sheets you were given: _____________________________________________________________________  [facility] - Preparing for Surgery Before surgery, you can play an important role.  Because skin is not sterile, your skin needs to be as free of germs as possible.  You can reduce the number of germs on your skin by washing with CHG (chlorahexidine gluconate) soap before surgery.  CHG is an antiseptic cleaner which kills germs and bonds with the skin to continue killing germs even after washing. Please DO NOT use if you have an allergy to CHG or antibacterial soaps.  If your skin becomes reddened/irritated stop using the CHG and inform your nurse when you arrive at Short Stay. Do not shave (including legs and underarms) for at least 48 hours prior to the first CHG shower.  You may shave your face/neck. Please follow these instructions carefully:  1.  Shower with CHG Soap the night before  surgery and the  morning of Surgery.  2.  If you choose to wash your hair, wash your hair first as usual with your  normal  shampoo.  3.  After you shampoo, rinse your hair and body thoroughly to remove the  shampoo.                           4.  Use CHG as you would any other liquid soap.  You can apply chg directly  to the skin and wash                       Gently with a scrungie or clean washcloth.  5.  Apply the CHG Soap to your body ONLY FROM THE NECK DOWN.   Do not use on face/ open                           Wound or open sores. Avoid contact with eyes, ears mouth and genitals (private parts).  Wash face,  Genitals (private parts) with your normal soap.             6.  Wash thoroughly, paying special attention to the area where your surgery  will be performed.  7.  Thoroughly rinse your body with warm water from the neck down.  8.  DO NOT shower/wash with your normal soap after using and rinsing off  the CHG Soap.                9.  Pat yourself dry with a clean towel.            10.  Wear clean pajamas.            11.  Place clean sheets on your bed the night of your first shower and do not  sleep with pets. Day of Surgery : Do not apply any lotions/deodorants the morning of surgery.  Please wear clean clothes to the hospital/surgery center.  FAILURE TO FOLLOW THESE INSTRUCTIONS MAY RESULT IN THE CANCELLATION OF YOUR SURGERY PATIENT SIGNATURE_________________________________  NURSE SIGNATURE__________________________________  ________________________________________________________________________             WHAT IS A BLOOD TRANSFUSION? Blood Transfusion Information  A transfusion is the replacement of blood or some of its parts. Blood is made up of multiple cells which provide different functions.  Red blood cells carry oxygen and are used for blood loss replacement.  White blood cells fight against infection.  Platelets control bleeding.  Plasma  helps clot blood.  Other blood products are available for specialized needs, such as hemophilia or other clotting disorders. BEFORE THE TRANSFUSION  Who gives blood for transfusions?   Healthy volunteers who are fully evaluated to make sure their blood is safe. This is blood bank blood. Transfusion therapy is the safest it has ever been in the practice of medicine. Before blood is taken from a donor, a complete history is taken to make sure that person has no history of diseases nor engages in risky social behavior (examples are intravenous drug use or sexual activity with multiple partners). The donor's travel history is screened to minimize risk of transmitting infections, such as malaria. The donated blood is tested for signs of infectious diseases, such as HIV and hepatitis. The blood is then tested to be sure it is compatible with you in order to minimize the chance of a transfusion reaction. If you or a relative donates blood, this is often done in anticipation of surgery and is not appropriate for emergency situations. It takes many days to process the donated blood. RISKS AND COMPLICATIONS Although transfusion therapy is very safe and saves many lives, the main dangers of transfusion include:   Getting an infectious disease.  Developing a transfusion reaction. This is an allergic reaction to something in the blood you were given. Every precaution is taken to prevent this. The decision to have a blood transfusion has been considered carefully by your caregiver before blood is given. Blood is not given unless the benefits outweigh the risks. AFTER THE TRANSFUSION  Right after receiving a blood transfusion, you will usually feel much better and more energetic. This is especially true if your red blood cells have gotten low (anemic). The transfusion raises the level of the red blood cells which carry oxygen, and this usually causes an energy increase.  The nurse administering the transfusion  will monitor you carefully for complications. HOME CARE INSTRUCTIONS  No special instructions are needed after a transfusion. You may  find your energy is better. Speak with your caregiver about any limitations on activity for underlying diseases you may have. SEEK MEDICAL CARE IF:   Your condition is not improving after your transfusion.  You develop redness or irritation at the intravenous (IV) site. SEEK IMMEDIATE MEDICAL CARE IF:  Any of the following symptoms occur over the next 12 hours:  Shaking chills.  You have a temperature by mouth above 102 F (38.9 C), not controlled by medicine.  Chest, back, or muscle pain.  People around you feel you are not acting correctly or are confused.  Shortness of breath or difficulty breathing.  Dizziness and fainting.  You get a rash or develop hives.  You have a decrease in urine output.  Your urine turns a dark color or changes to pink, red, or brown. Any of the following symptoms occur over the next 10 days:  You have a temperature by mouth above 102 F (38.9 C), not controlled by medicine.  Shortness of breath.  Weakness after normal activity.  The white part of the eye turns yellow (jaundice).  You have a decrease in the amount of urine or are urinating less often.  Your urine turns a dark color or changes to pink, red, or brown. Document Released: 01/05/2000 Document Revised: 04/01/2011 Document Reviewed: 08/24/2007 ExitCare Patient Information 2014 [place].  _______________________________________________________________________  Incentive Spirometer  An incentive spirometer is a tool that can help keep your lungs clear and active. This tool measures how well you are filling your lungs with each breath. Taking long deep breaths may help reverse or decrease the chance of developing breathing (pulmonary) problems (especially infection) following:  A long period of time when you are unable to move or be  active. BEFORE THE PROCEDURE   If the spirometer includes an indicator to show your best effort, your nurse or respiratory therapist will set it to a desired goal.  If possible, sit up straight or lean slightly forward. Try not to slouch.  Hold the incentive spirometer in an upright position. INSTRUCTIONS FOR USE  1. Sit on the edge of your bed if possible, or sit up as far as you can in bed or on a chair. 2. Hold the incentive spirometer in an upright position. 3. Breathe out normally. 4. Place the mouthpiece in your mouth and seal your lips tightly around it. 5. Breathe in slowly and as deeply as possible, raising the piston or the ball toward the top of the column. 6. Hold your breath for 3-5 seconds or for as long as possible. Allow the piston or ball to fall to the bottom of the column. 7. Remove the mouthpiece from your mouth and breathe out normally. 8. Rest for a few seconds and repeat Steps 1 through 7 at least 10 times every 1-2 hours when you are awake. Take your time and take a few normal breaths between deep breaths. 9. The spirometer may include an indicator to show your best effort. Use the indicator as a goal to work toward during each repetition. 10. After each set of 10 deep breaths, practice coughing to be sure your lungs are clear. If you have an incision (the cut made at the time of surgery), support your incision when coughing by placing a pillow or rolled up towels firmly against it. Once you are able to get out of bed, walk around indoors and cough well. You may stop using the incentive spirometer when instructed by your caregiver.  RISKS AND COMPLICATIONS  Take your time so you do not get dizzy or light-headed.  If you are in pain, you may need to take or ask for pain medication before doing incentive spirometry. It is harder to take a deep breath if you are having pain. AFTER USE  Rest and breathe slowly and easily.  It can be helpful to keep track of a log of  your progress. Your caregiver can provide you with a simple table to help with this. If you are using the spirometer at home, follow these instructions: [place] IF:   You are having difficultly using the spirometer.  You have trouble using the spirometer as often as instructed.  Your pain medication is not giving enough relief while using the spirometer.  You develop fever of 100.5 F (38.1 C) or higher. SEEK IMMEDIATE MEDICAL CARE IF:   You cough up bloody sputum that had not been present before.  You develop fever of 102 F (38.9 C) or greater.  You develop worsening pain at or near the incision site. MAKE SURE YOU:   Understand these instructions.  Will watch your condition.  Will get help right away if you are not doing well or get worse. Document Released: 05/20/2006 Document Revised: 04/01/2011 Document Reviewed: 07/21/2006 [facility] Patient Information 2014 [place], [place].   ________________________________________________________________________

## 2016-04-08 ENCOUNTER — Other Ambulatory Visit (HOSPITAL_COMMUNITY): Payer: PPO

## 2016-04-08 ENCOUNTER — Telehealth: Payer: Self-pay

## 2016-04-08 NOTE — Telephone Encounter (Signed)
Dr Denman George aware of u/a results from pre admit testing, no treatment needed at this time. RN notified, Rise Paganini in preadmit testing.

## 2016-04-09 ENCOUNTER — Encounter (HOSPITAL_COMMUNITY)
Admission: RE | Disposition: A | Discharge status: Home / Self Care | Payer: Self-pay | Source: Ambulatory Visit | Attending: Gynecologic Oncology

## 2016-04-09 ENCOUNTER — Ambulatory Visit: Payer: PPO | Admitting: Radiation Oncology

## 2016-04-09 ENCOUNTER — Ambulatory Visit (HOSPITAL_COMMUNITY): Payer: PPO | Admitting: Certified Registered Nurse Anesthetist

## 2016-04-09 ENCOUNTER — Encounter (HOSPITAL_COMMUNITY): Payer: Self-pay | Admitting: Anesthesiology

## 2016-04-09 ENCOUNTER — Ambulatory Visit (HOSPITAL_COMMUNITY)
Admission: RE | Admit: 2016-04-09 | Discharge: 2016-04-09 | Disposition: A | Discharge status: Home / Self Care | Payer: PPO | Source: Ambulatory Visit | Attending: Gynecologic Oncology | Admitting: Gynecologic Oncology

## 2016-04-09 DIAGNOSIS — E785 Hyperlipidemia, unspecified: Secondary | ICD-10-CM | POA: Diagnosis not present

## 2016-04-09 DIAGNOSIS — N9089 Other specified noninflammatory disorders of vulva and perineum: Secondary | ICD-10-CM

## 2016-04-09 DIAGNOSIS — Z96651 Presence of right artificial knee joint: Secondary | ICD-10-CM | POA: Insufficient documentation

## 2016-04-09 DIAGNOSIS — I10 Essential (primary) hypertension: Secondary | ICD-10-CM | POA: Diagnosis not present

## 2016-04-09 DIAGNOSIS — Z79899 Other long term (current) drug therapy: Secondary | ICD-10-CM | POA: Diagnosis not present

## 2016-04-09 DIAGNOSIS — Z96642 Presence of left artificial hip joint: Secondary | ICD-10-CM | POA: Insufficient documentation

## 2016-04-09 DIAGNOSIS — C519 Malignant neoplasm of vulva, unspecified: Secondary | ICD-10-CM | POA: Diagnosis not present

## 2016-04-09 DIAGNOSIS — E119 Type 2 diabetes mellitus without complications: Secondary | ICD-10-CM | POA: Diagnosis not present

## 2016-04-09 DIAGNOSIS — L900 Lichen sclerosus et atrophicus: Secondary | ICD-10-CM | POA: Diagnosis not present

## 2016-04-09 DIAGNOSIS — Z7984 Long term (current) use of oral hypoglycemic drugs: Secondary | ICD-10-CM | POA: Insufficient documentation

## 2016-04-09 DIAGNOSIS — I350 Nonrheumatic aortic (valve) stenosis: Secondary | ICD-10-CM | POA: Insufficient documentation

## 2016-04-09 DIAGNOSIS — Z7982 Long term (current) use of aspirin: Secondary | ICD-10-CM | POA: Diagnosis not present

## 2016-04-09 DIAGNOSIS — N904 Leukoplakia of vulva: Secondary | ICD-10-CM | POA: Diagnosis not present

## 2016-04-09 HISTORY — PX: RADICAL VULVECTOMY: SHX6584

## 2016-04-09 LAB — TYPE AND SCREEN
ABO/RH(D): B POS
ANTIBODY SCREEN: NEGATIVE

## 2016-04-09 LAB — GLUCOSE, CAPILLARY
GLUCOSE-CAPILLARY: 79 mg/dL (ref 65–99)
Glucose-Capillary: 95 mg/dL (ref 65–99)

## 2016-04-09 SURGERY — VULVECTOMY, RADICAL
Anesthesia: General | Site: Vulva | Laterality: Left

## 2016-04-09 MED ORDER — PROPOFOL 10 MG/ML IV BOLUS
INTRAVENOUS | Status: DC | PRN
  Administered 2016-04-09: 50 mg via INTRAVENOUS
  Administered 2016-04-09: 150 mg via INTRAVENOUS

## 2016-04-09 MED ORDER — BUPIVACAINE HCL (PF) 0.5 % IJ SOLN
INTRAMUSCULAR | Status: DC | PRN
  Administered 2016-04-09: 10 mL

## 2016-04-09 MED ORDER — BUPIVACAINE LIPOSOME 1.3 % IJ SUSP
INTRAMUSCULAR | Status: AC
  Filled 2016-04-09: qty 20

## 2016-04-09 MED ORDER — LACTATED RINGERS IV SOLN
INTRAVENOUS | Status: DC
  Administered 2016-04-09 (×3): via INTRAVENOUS

## 2016-04-09 MED ORDER — ROCURONIUM BROMIDE 100 MG/10ML IV SOLN
INTRAVENOUS | Status: DC | PRN
  Administered 2016-04-09: 20 mg via INTRAVENOUS

## 2016-04-09 MED ORDER — HYDROCODONE-ACETAMINOPHEN 5-325 MG PO TABS: 1.0000 | ORAL_TABLET | Freq: Four times a day (QID) | ORAL | 0 refills | Status: DC | PRN

## 2016-04-09 MED ORDER — ONDANSETRON HCL 4 MG/2ML IJ SOLN
INTRAMUSCULAR | Status: DC | PRN
  Administered 2016-04-09: 4 mg via INTRAVENOUS

## 2016-04-09 MED ORDER — SUCCINYLCHOLINE CHLORIDE 200 MG/10ML IV SOSY
PREFILLED_SYRINGE | INTRAVENOUS | Status: AC
  Filled 2016-04-09: qty 10

## 2016-04-09 MED ORDER — FENTANYL CITRATE (PF) 100 MCG/2ML IJ SOLN
INTRAMUSCULAR | Status: AC
  Filled 2016-04-09: qty 2

## 2016-04-09 MED ORDER — MIDAZOLAM HCL 2 MG/2ML IJ SOLN
INTRAMUSCULAR | Status: AC
  Filled 2016-04-09: qty 2

## 2016-04-09 MED ORDER — FENTANYL CITRATE (PF) 100 MCG/2ML IJ SOLN: 25.0000 ug | INTRAMUSCULAR | Status: DC | PRN

## 2016-04-09 MED ORDER — ROCURONIUM BROMIDE 50 MG/5ML IV SOSY
PREFILLED_SYRINGE | INTRAVENOUS | Status: AC
  Filled 2016-04-09: qty 5

## 2016-04-09 MED ORDER — SUGAMMADEX SODIUM 200 MG/2ML IV SOLN
INTRAVENOUS | Status: DC | PRN
  Administered 2016-04-09: 200 mg via INTRAVENOUS

## 2016-04-09 MED ORDER — LIDOCAINE HCL (CARDIAC) 20 MG/ML IV SOLN
INTRAVENOUS | Status: DC | PRN
  Administered 2016-04-09: 60 mg via INTRAVENOUS

## 2016-04-09 MED ORDER — CEFAZOLIN SODIUM-DEXTROSE 2-4 GM/100ML-% IV SOLN
2.0000 g | INTRAVENOUS | Status: AC
  Administered 2016-04-09: 2 g via INTRAVENOUS
  Filled 2016-04-09: qty 100

## 2016-04-09 MED ORDER — SODIUM CHLORIDE 0.9 % IJ SOLN
INTRAMUSCULAR | Status: AC
  Filled 2016-04-09: qty 50

## 2016-04-09 MED ORDER — LIDOCAINE-EPINEPHRINE 1 %-1:100000 IJ SOLN
INTRAMUSCULAR | Status: AC
  Filled 2016-04-09: qty 1

## 2016-04-09 MED ORDER — 0.9 % SODIUM CHLORIDE (POUR BTL) OPTIME
TOPICAL | Status: DC | PRN
  Administered 2016-04-09: 1000 mL

## 2016-04-09 MED ORDER — MIDAZOLAM HCL 5 MG/5ML IJ SOLN
INTRAMUSCULAR | Status: DC | PRN
  Administered 2016-04-09: 2 mg via INTRAVENOUS

## 2016-04-09 MED ORDER — ONDANSETRON HCL 4 MG/2ML IJ SOLN
INTRAMUSCULAR | Status: AC
  Filled 2016-04-09: qty 2

## 2016-04-09 MED ORDER — BACITRACIN-NEOMYCIN-POLYMYXIN 400-5-5000 EX OINT
TOPICAL_OINTMENT | CUTANEOUS | Status: AC
  Filled 2016-04-09: qty 1

## 2016-04-09 MED ORDER — ACETIC ACID 5 % SOLN
Status: AC
  Filled 2016-04-09: qty 500

## 2016-04-09 MED ORDER — LIDOCAINE 2% (20 MG/ML) 5 ML SYRINGE
INTRAMUSCULAR | Status: AC
  Filled 2016-04-09: qty 5

## 2016-04-09 MED ORDER — FENTANYL CITRATE (PF) 100 MCG/2ML IJ SOLN
INTRAMUSCULAR | Status: DC | PRN
  Administered 2016-04-09 (×3): 50 ug via INTRAVENOUS

## 2016-04-09 MED ORDER — BUPIVACAINE HCL (PF) 0.5 % IJ SOLN
INTRAMUSCULAR | Status: AC
  Filled 2016-04-09: qty 30

## 2016-04-09 MED ORDER — SODIUM CHLORIDE 0.9 % IJ SOLN
INTRAMUSCULAR | Status: DC | PRN
  Administered 2016-04-09: 20 mL

## 2016-04-09 MED ORDER — PROPOFOL 10 MG/ML IV BOLUS
INTRAVENOUS | Status: AC
  Filled 2016-04-09: qty 20

## 2016-04-09 MED ORDER — SUGAMMADEX SODIUM 200 MG/2ML IV SOLN
INTRAVENOUS | Status: AC
  Filled 2016-04-09: qty 2

## 2016-04-09 MED ORDER — BUPIVACAINE LIPOSOME 1.3 % IJ SUSP
INTRAMUSCULAR | Status: DC | PRN
  Administered 2016-04-09: 20 mL

## 2016-04-09 MED ORDER — SUCCINYLCHOLINE CHLORIDE 20 MG/ML IJ SOLN
INTRAMUSCULAR | Status: DC | PRN
  Administered 2016-04-09: 120 mg via INTRAVENOUS

## 2016-04-09 SURGICAL SUPPLY — 38 items
BLADE 15 SAFETY STRL DISP (BLADE) ×2 IMPLANT
BLADE SURG 15 STRL LF DISP TIS (BLADE) ×1 IMPLANT
BLADE SURG 15 STRL SS (BLADE) ×1
COVER SURGICAL LIGHT HANDLE (MISCELLANEOUS) ×2 IMPLANT
DRAPE SURG IRRIG POUCH 19X23 (DRAPES) ×2 IMPLANT
DRSG TELFA 3X8 NADH (GAUZE/BANDAGES/DRESSINGS) ×2 IMPLANT
ELECT PENCIL ROCKER SW 15FT (MISCELLANEOUS) ×2 IMPLANT
GAUZE SPONGE 4X4 12PLY STRL (GAUZE/BANDAGES/DRESSINGS) ×2 IMPLANT
GAUZE SPONGE 4X4 16PLY XRAY LF (GAUZE/BANDAGES/DRESSINGS) ×2 IMPLANT
GLOVE BIO SURGEON STRL SZ 6 (GLOVE) ×4 IMPLANT
GOWN STRL REUS W/ TWL LRG LVL3 (GOWN DISPOSABLE) ×2 IMPLANT
GOWN STRL REUS W/TWL LRG LVL3 (GOWN DISPOSABLE) ×2
KIT BASIN OR (CUSTOM PROCEDURE TRAY) ×2 IMPLANT
NEEDLE HYPO 22GX1.5 SAFETY (NEEDLE) ×2 IMPLANT
NEEDLE SPNL 22GX7 QUINCKE BK (NEEDLE) IMPLANT
NS IRRIG 1000ML POUR BTL (IV SOLUTION) ×2 IMPLANT
PACK LITHOTOMY IV (CUSTOM PROCEDURE TRAY) ×2 IMPLANT
PACK PERINEAL COLD (PAD) ×10 IMPLANT
SCOPETTES 8  STERILE (MISCELLANEOUS)
SCOPETTES 8 STERILE (MISCELLANEOUS) IMPLANT
SHEARS HARMONIC 9CM CVD (BLADE) IMPLANT
SUT VIC AB 0 CT1 27 (SUTURE)
SUT VIC AB 0 CT1 27XBRD ANTBC (SUTURE) IMPLANT
SUT VIC AB 2-0 SH 27 (SUTURE) ×4
SUT VIC AB 2-0 SH 27X BRD (SUTURE) ×4 IMPLANT
SUT VIC AB 3-0 SH 27 (SUTURE) ×5
SUT VIC AB 3-0 SH 27XBRD (SUTURE) ×5 IMPLANT
SUT VIC AB 4-0 P2 18 (SUTURE) IMPLANT
SUT VIC AB 4-0 SH 27 (SUTURE) ×5
SUT VIC AB 4-0 SH 27XBRD (SUTURE) ×5 IMPLANT
SYR BULB IRRIGATION 50ML (SYRINGE) ×2 IMPLANT
SYR CONTROL 10ML LL (SYRINGE) ×2 IMPLANT
TOWEL OR 17X26 10 PK STRL BLUE (TOWEL DISPOSABLE) ×2 IMPLANT
TOWEL OR NON WOVEN STRL DISP B (DISPOSABLE) ×2 IMPLANT
TRAY FOLEY W/METER SILVER 16FR (SET/KITS/TRAYS/PACK) ×2 IMPLANT
UNDERPAD 30X30 INCONTINENT (UNDERPADS AND DIAPERS) ×2 IMPLANT
WATER STERILE IRR 1500ML POUR (IV SOLUTION) IMPLANT
YANKAUER SUCT BULB TIP NO VENT (SUCTIONS) ×2 IMPLANT

## 2016-04-09 NOTE — Interval H&P Note (Signed)
History and Physical Interval Note:  04/09/2016 1:01 PM  [name]  has presented today for surgery, with the diagnosis of VULVAR CANCER  The various methods of treatment have been discussed with the patient and family. After consideration of risks, benefits and other options for treatment, the patient has consented to  Procedure(s): RADICAL LEFT VULVECTOMY (Left) as a surgical intervention .  The patient's history has been reviewed, patient examined, no change in status, stable for surgery.  I have reviewed the patient's chart and labs.  Questions were answered to the patient's satisfaction.     Donaciano Eva

## 2016-04-09 NOTE — Op Note (Signed)
PATIENT: [name] DATE: 04/09/16   Preop Diagnosis: vulvar cancer  Postoperative Diagnosis: same  Surgery: Partial radical left vulvectomy  Surgeons:  Donaciano Eva, MD Assistant: Dr Lahoma Crocker, (an MD assistant was necessary for tissue manipulation, retraction and positioning due to the complexity of the case and hospital policies).   Anesthesia: General  Estimated blood loss: <50 ml  IVF:  467ml   Urine output: 858 ml   Complications: None   Pathology: 1/ left vulva with marking stitch at 12 o'clock anterior, 2/ lateral (thigh) margin with stitch at true new lateral margin. 3/ medial (vaginal) margin with marking stitch at true new medial margin. Operative findings: 2cm left mid labial nodular lesion  Procedure: The patient was identified in the preoperative holding area. Informed consent was signed on the chart. Patient was seen history was reviewed and exam was performed.   The patient was then taken to the operating room and placed in the supine position with SCD hose on. General anesthesia was then induced without difficulty. She was then placed in the dorsolithotomy position. The perineum was prepped with Betadine. The vagina was prepped with Betadine. The patient was then draped after the prep was dried. A Foley catheter was inserted into the bladder under sterile conditions.  Timeout was performed the patient, procedure, antibiotic, allergy, and length of procedure.The lesion was identified and the marking pen was used to circumscribe the area with appropriate surgical margins. The 15 blade scalpel was used to make an incision through the skin circumferentially as marked. Dissection was performed deep to the fascial planes to excise a radical piece of tissue. The skin elipse was grasped and was separated from the underlying  Fatty tissues with the bovie device. After the specimen had been completely resected, it was oriented and marked at 12 o'clock  with a 0-vicryl suture. The lesion appeared close to the medial and lateral margins, therefore, additional margins were taken from these areas with marking stitches placed at the midpoint of the true new medial and lateral margins. The specimens were pinned to a cork board to orient and sent for The bovie was used to obtain hemostasis at the surgical bed. The subcutaneous tissues were irrigated and made hemostatic.   The deep dermal layer was approximated with 3-0vicryl mattress sutures to bring the skin edges into approximation and off tension. The wound was closed following langher's lines. The cutaneous layer was closed with interrupted 4-0 vicryl stitches and mattress sutures to ensure a tension free and hemostatic closure. The perineum was again irrigated. The foley was removed.  All instrument, suture, laparotomy, Ray-Tec, and needle counts were correct x2. The patient tolerated the procedure well and was taken recovery room in stable condition. This is Everitt Amber dictating an operative note on [name].

## 2016-04-09 NOTE — Anesthesia Preprocedure Evaluation (Signed)
Anesthesia Evaluation  Patient identified by MRN, date of birth, ID band Patient awake    Reviewed: Allergy & Precautions, NPO status , Patient's Chart, lab work & pertinent test results  History of Anesthesia Complications (+) PONV and history of anesthetic complications  Airway Mallampati: III     Mouth opening: Limited Mouth Opening  Dental  (+) Teeth Intact, Dental Advisory Given   Pulmonary neg pulmonary ROS,    breath sounds clear to auscultation       Cardiovascular hypertension, + Valvular Problems/Murmurs AS  Rhythm:Regular Rate:Normal     Neuro/Psych negative neurological ROS  negative psych ROS   GI/Hepatic negative GI ROS, Neg liver ROS,   Endo/Other  diabetes  Renal/GU negative Renal ROS  negative genitourinary   Musculoskeletal   Abdominal   Peds negative pediatric ROS (+)  Hematology   Anesthesia Other Findings   Reproductive/Obstetrics                            Lab Results  Component Value Date   WBC 8.5 04/05/2016   HGB 9.0 (L) 04/05/2016   HCT 30.3 (L) 04/05/2016   MCV 75.6 (L) 04/05/2016   PLT 296 04/05/2016   Lab Results  Component Value Date   CREATININE 0.95 04/05/2016   BUN 19 04/05/2016   NA 146 (H) 04/05/2016   K 4.7 04/05/2016   CL 110 04/05/2016   CO2 29 04/05/2016   Lab Results  Component Value Date   INR 1.05 02/23/2016   INR 1.03 10/21/2013   INR 1.57 (H) 04/02/2010   Echo Left ventricle: The cavity size was normal. There was mild focal   basal hypertrophy of the septum. Systolic function was normal.   The estimated ejection fraction was in the range of 60% to 65%.   Doppler parameters are consistent with abnormal left ventricular   relaxation (grade 1 diastolic dysfunction). - Aortic valve: There was moderate stenosis. Valve area (VTI): 1.46   cm^2. Valve area (Vmax): 1.53 cm^2. Valve area (Vmean): 1.37   cm^2. - Right atrium: The atrium  was mildly dilated. - Atrial septum: No defect or patent foramen ovale was identified. - Pulmonary arteries: PA peak pressure: 33 mm Hg (S).   Anesthesia Physical Anesthesia Plan  ASA: III  Anesthesia Plan: General   Post-op Pain Management:    Induction: Intravenous  Airway Management Planned: Oral ETT and Video Laryngoscope Planned  Additional Equipment:   Intra-op Plan:   Post-operative Plan: Extubation in OR  Informed Consent: I have reviewed the patients History and Physical, chart, labs and discussed the procedure including the risks, benefits and alternatives for the proposed anesthesia with the patient or authorized representative who has indicated his/her understanding and acceptance.   Dental advisory given  Plan Discussed with: CRNA  Anesthesia Plan Comments:         Anesthesia Quick Evaluation

## 2016-04-09 NOTE — Anesthesia Postprocedure Evaluation (Addendum)
Anesthesia Post Note  Patient: [name]  Procedure(s) Performed: Procedure(s) (LRB): RADICAL LEFT VULVECTOMY (Left)  Patient location during evaluation: PACU Anesthesia Type: General Level of consciousness: awake and alert Pain management: pain level controlled Vital Signs Assessment: post-procedure vital signs reviewed and stable Respiratory status: spontaneous breathing, nonlabored ventilation, respiratory function stable and patient connected to nasal cannula oxygen Cardiovascular status: blood pressure returned to baseline and stable Postop Assessment: no signs of nausea or vomiting Anesthetic complications: no       Last Vitals:  Vitals:   04/09/16 1800 04/09/16 1815  BP: (!) 142/87 137/78  Pulse: 85 78  Resp: 14 14  Temp:  36.7 C    Last Pain:  Vitals:   04/09/16 1800  TempSrc:   PainSc: 0-No pain                 Effie Berkshire

## 2016-04-09 NOTE — Transfer of Care (Signed)
Immediate Anesthesia Transfer of Care Note  Patient: [name]  Procedure(s) Performed: Procedure(s): RADICAL LEFT VULVECTOMY (Left)  Patient Location: PACU  Anesthesia Type:General  Level of Consciousness: awake, alert  and oriented  Airway & Oxygen Therapy: Patient Spontanous Breathing and Patient connected to face mask oxygen  Post-op Assessment: Report given to RN and Post -op Vital signs reviewed and stable  Post vital signs: Reviewed and stable  Last Vitals:  Vitals:   04/09/16 1245  BP: 125/68  Pulse: 100  Resp: 20  Temp: 36.4 C    Last Pain:  Vitals:   04/09/16 1324  TempSrc:   PainSc: 2       Patients Stated Pain Goal: 5 ([id number])  Complications: No apparent anesthesia complications

## 2016-04-09 NOTE — Discharge Instructions (Signed)
General Anesthesia, Adult, Care After These instructions provide you with information about caring for yourself after your procedure. Your health care provider may also give you more specific instructions. Your treatment has been planned according to current medical practices, but problems sometimes occur. Call your health care provider if you have any problems or questions after your procedure. What can I expect after the procedure? After the procedure, it is common to have:  Vomiting.  A sore throat.  Mental slowness. It is common to feel:  Nauseous.  Cold or shivery.  Sleepy.  Tired.  Sore or achy, even in parts of your body where you did not have surgery. Follow these instructions at home: For at least 24 hours after the procedure:   Do not:  Participate in activities where you could fall or become injured.  Drive.  Use heavy machinery.  Drink alcohol.  Take sleeping pills or medicines that cause drowsiness.  Make important decisions or sign legal documents.  Take care of children on your own.  Rest. Eating and drinking   If you vomit, drink water, juice, or soup when you can drink without vomiting.  Drink enough fluid to keep your urine clear or pale yellow.  Make sure you have little or no nausea before eating solid foods.  Follow the diet recommended by your health care provider. General instructions   Have a responsible adult stay with you until you are awake and alert.  Return to your normal activities as told by your health care provider. Ask your health care provider what activities are safe for you.  Take over-the-counter and prescription medicines only as told by your health care provider.  If you smoke, do not smoke without supervision.  Keep all follow-up visits as told by your health care provider. This is important. Contact a health care provider if:  You continue to have nausea or vomiting at home, and medicines are not helpful.  You  cannot drink fluids or start eating again.  You cannot urinate after 8-12 hours.  You develop a skin rash.  You have fever.  You have increasing redness at the site of your procedure. Get help right away if:  You have difficulty breathing.  You have chest pain.  You have unexpected bleeding.  You feel that you are having a life-threatening or urgent problem. This information is not intended to replace advice given to you by your health care provider. Make sure you discuss any questions you have with your health care provider. Document Released: 04/15/2000 Document Revised: 06/12/2015 Document Reviewed: 12/22/2014 Elsevier Interactive Patient Education  2017 [place] After Refer to this sheet in the next few weeks. These instructions provide you with information about caring for yourself after your procedure. Your health care provider may also give you more specific instructions. Your treatment has been planned according to current medical practices, but problems sometimes occur. Call your health care provider if you have any problems or questions after your procedure. What can I expect after the procedure? After the procedure, it is common to have:  Vaginal pain.  Vaginal numbness.  Vaginal swelling.  Bloody vaginal discharge. Follow these instructions at home: Activity    Rest as told by your health care provider.  Do not lift, push, or pull more than 5 lb (2.3 kg).  Avoid activities that require a lot of energy for as long as told by your health care provider. This includes any exercise.  Raise (elevate)  your legs while sitting or lying down.  Avoid standing or sitting in one place for long periods of time.  Do not cross your legs, especially when sitting. Bathing   Do not take baths, swim, or use a hot tub until your health care provider approves. Ask your health care provider if you can take showers. You may only be allowed to take  sponge baths for bathing.  After passing urine or a bowel movement, wipe yourself from front to back and clean your vaginal area using a spray bottle.  If told by your health care provider, take a sitz bath to help with discomfort. This is a warm water bath you take while sitting down.  Do this 3-4 times per day, or as often as told by your health care provider.  The water should only come up to your hips and cover your buttocks.  You may pat the area dry with a soft, clean towel. If needed, you may then gently dry the area with a hair dryer on a cool setting for 5-10 minutes. An enclosed box fan may also be used to gently dry the area. Incision care    Follow instructions from your health care provider about how to take care of your incision. Make sure you:  Wash your hands with soap and water before you change your bandage (dressing). If soap and water are not available, use hand sanitizer.  Change your dressing as told by your health care provider.  Leave stitches (sutures), skin glue, adhesive strips, or surgical clips in place. These skin closures may need to stay in place for 2 weeks or longer. If adhesive strip edges start to loosen and curl up, you may trim the loose edges. Do not remove adhesive strips completely unless your health care provider tells you to do that.  Check your incision area every day for signs of infection. Check for:  More redness, swelling, or pain.  More fluid or blood.  Warmth.  Pus or a bad smell. Lifestyle   Do not douche or use tampons until your health care provider approves.  Do not have sex until your health care provider approves. Tell your health care provider if you have pain or numbness when you return to sexual activity.  Wear comfortable, loose-fitting clothing. Driving   Do not drive or operate heavy machinery while taking prescription pain medicine.  Do not drive for 24 hours if you received a medicine to help you relax  (sedative). General instructions    Take over-the-counter and prescription medicines only as told by your health care provider.  Take a stool softener to help prevent constipation as told by your health care provider. You may need to do this if you are taking prescription pain medicine.  Drink enough fluid to keep your urine clear or pale yellow.  If you were sent home with a drain, take care of it as told by your health care provider.  Wear compression stockings as told by your health care provider. These stockings help to prevent blood clots and reduce swelling in your legs.  Keep all follow-up visits as told by your health care provider. This is important. Contact a health care provider if:  You have more redness, swelling, or pain around your incision.  You have more fluid or blood coming from your incision.  Your incision feels warm to the touch.  You have pus or a bad smell coming from your incision.  You have a fever.  You have  painful or bloody urination.  You feel nauseous or you vomit.  You develop diarrhea.  You develop constipation.  You develop a rash.  You feel dizzy or light-headed.  You have pain that does not get better with medicine.  Your incision breaks open. Get help right away if:  You faint.  You develop leg or chest pain.  You develop abdominal pain.  You develop shortness of breath. This information is not intended to replace advice given to you by your health care provider. Make sure you discuss any questions you have with your health care provider. Document Released: 08/22/2003 Document Revised: 06/15/2015 Document Reviewed: 01/02/2015 Elsevier Interactive Patient Education  2017 [employer].

## 2016-04-09 NOTE — H&P (View-Only) (Signed)
Consult Note: Gyn-Onc  Consult was requested by Dr. Elonda Husky for the evaluation of [name] 76 y.o. female   CC:  Chief Complaint  Patient presents with  . Vulvar Cancer    Assessment/Plan:  Ms. [name]  is a 76 y.o.  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   1. Radiation to groin, Discussed with Dr Sondra Come holding off radiation until bx back. 2. Nodules on CT scan - repeat CT in 3 months for follow-up.  3. Left vulvar lesion - likely secondary to irritation from suture material. Will follow-up today's biopsy. If positive, will consider surgery vs primary radiation to vulvar at same time as bilateral groin rads.[name]   HPI: [name] is a 76 year old woman who is seen in consultation at the request of Dr Elonda Husky for a right labial lesion.    The patient reports that she began feeling pruritis of the vulva in October, 2017. She was seen by her PCP who saw no lesion and prescribed estradiol.  The pruritis persisted and became worse and she began to feel a nodule in the area and therefore again saw her PCP followed by a referral to gynecologist, Dr Elonda Husky on 02/20/16. The nodular lesion was appreciated on the right vulva. No biopsy was taken at that time. Biopsy was performed on 02/22/16 and pathology from biopsy confirmed invasive SCC.   CT scan abdo/pelvis was negative for evidence of suspicious nodes or distant mets.  On 02/22/16 she underwent right radical vulvectomy and right inguinal lymphadenectomy with Dr Nancy Marus. Final pathology revealed: a 2.1cm moderately differentiated SCC of the right vulva. Margins were negative (closest was 3 o'clock, 34mm). Depth of invasion was 62mm. 1 of 6 inguinofemoral lymph nodes (right) was positive for metastatic disease.   Interval Hx:  Her JP was removed 2 weeks postop. She has had significant irritation of hte left anterior vulva since surgery. Extremely uncomfortable. No issues in  right leg or groin.  CT chest on 04/01/16 showed: Multiple pulmonary nodules as detailed above, generally in the 74mm and less average diameter range although with 1 right lower lobe subpleural nodule measuring about 8 mm in diameter. These likely warrant surveillance. Hypodense right thyroid nodule, 1.1 cm in long axis. Consider further evaluation with thyroid ultrasound. If patient is clinically hyperthyroid, consider nuclear medicine thyroid uptake and scan. Stable hypodense lateral splenic lesion, likely a cyst.  Current Meds:  Outpatient Encounter Prescriptions as of 04/02/2016  Medication Sig  . aspirin 81 MG tablet Take 81 mg by mouth daily.  . Cholecalciferol (VITAMIN D3 PO) Take 1,000 Units by mouth 2 (two) times daily.  [name] CINNAMON PO Take 2 capsules by mouth daily. 2,000 mg daily   . furosemide (LASIX) 40 MG tablet TAKE ONE TABLET BY MOUTH ONCE DAILY.  [name] gabapentin (NEURONTIN) 100 MG capsule TAKE 1 CAPSULE BY MOUTH THREE TIMES A DAY.  [name] HYDROcodone-acetaminophen (NORCO/VICODIN) 5-325 MG tablet TAKE (1) TABLET BY MOUTH TWICE A DAY AS NEEDED.  [name] losartan (COZAAR) 50 MG tablet TAKE ONE TABLET BY MOUTH ONCE DAILY.  . metFORMIN (GLUCOPHAGE) 500 MG tablet TAKE (1) TABLET BY MOUTH TWICE DAILY.  . Multiple Minerals-Vitamins (CALCIUM-MAGNESIUM-ZINC-D3 PO) Take 2 tablets by mouth 2 (two) times daily.  . potassium chloride SA (K-DUR,KLOR-CON) 20 MEQ tablet Take 1 tablet (20 mEq total) by mouth daily.   No facility-administered encounter medications on file as of 04/02/2016.     Allergy:  Allergies  Allergen Reactions  . Celebrex [Celecoxib]     GI upset   . Doxycycline Nausea Only    Gi upset  . Lodine [Etodolac] Nausea Only    Gi upset  . Nabumetone     (relafen)  . Tetracycline   . Xarelto [Rivaroxaban] Hives    Social Hx:   Social History   Social History  . Marital status: Married    Spouse name: N/A  . Number of children: 2  . Years of education: N/A   Occupational  History  . Not on file.   Social History Main Topics  . Smoking status: Never Smoker  . Smokeless tobacco: Never Used  . Alcohol use No  . Drug use: No  . Sexual activity: Not on file   Other Topics Concern  . Not on file   Social History Narrative  . No narrative on file    Past Surgical Hx:  Past Surgical History:  Procedure Laterality Date  . ABDOMINAL HYSTERECTOMY     complete  . BACK SURGERY     upper  with plate frontal fusion and lower x 2  . COLONOSCOPY    . KNEE SURGERY Left    arthroscopy  . TOE SURGERY Left   . TONSILLECTOMY    . TOTAL HIP ARTHROPLASTY Left 10/27/2013   Procedure: LEFT TOTAL HIP ARTHROPLASTY;  Surgeon: Tobi Bastos, MD;  Location: [facility];  Service: Orthopedics;  Laterality: Left;  . TOTAL KNEE ARTHROPLASTY Right   . VULVA /PERINEUM BIOPSY  02/22/2016  . VULVECTOMY Right 02/27/2016   Procedure: RADICAL RIGHT VULVECTOMY WITH RIGHT INGUINAL LYMPH NODE DISSECTION;  Surgeon: Nancy Marus, MD;  Location: [facility];  Service: Gynecology;  Laterality: Right;    Past Medical Hx:  Past Medical History:  Diagnosis Date  . Aortic stenosis, mild   . Aortic valve stenosis, mild   . Arthritis   . Cancer ([place])    vulvar   . Complication of anesthesia   . Diabetes mellitus without complication ([place])    type 2  . Heart murmur   . History of blood transfusion age 89 or 54 months old  . Hypertension   . IFG (impaired fasting glucose)   . PONV (postoperative nausea and vomiting)    years ago with back surgery-nausea  . Vitamin D deficiency     Past Gynecological History:  Remote hx of hysterectomy for fibroids. No hx of abnromal paps (last pap remote) No LMP recorded. Patient has had a hysterectomy.  Family Hx:  Family History  Problem Relation Age of Onset  . Hypertension Mother   . Hypertension Father   . Throat cancer Father   . Cancer Sister     Breast  . Hypertension Brother     Review of Systems:  Constitutional  Feels well,     ENT Normal appearing ears and nares bilaterally Skin/Breast  + vulvar pruritis. Cardiovascular  No chest pain, shortness of breath, or edema  Pulmonary  No cough or wheeze.  Gastro Intestinal  No nausea, vomitting, or diarrhoea. No bright red blood per rectum, no abdominal pain, change in bowel movement, or constipation.  Genito Urinary  No frequency, urgency, dysuria, no bleeding Musculo Skeletal  No myalgia, arthralgia, joint swelling or pain  Neurologic  No weakness, numbness, change in gait,  Psychology  No depression, anxiety, insomnia.   Vitals:  Blood pressure (!) 148/79, pulse 83, temperature 97.9 F (36.6 C), temperature source Oral, resp. rate 20, weight  194 lb 6.4 oz (88.2 kg), SpO2 99 %.  Physical Exam: WD in NAD Neck  Supple NROM, without any enlargements.  Lymph Node Survey No cervical supraclavicular or inguinal adenopathy. Right groin incision well healed. Cardiovascular  Pulse normal rate, regularity and rhythm. S1 and S2 normal.  Lungs  Clear to auscultation bilateraly, without wheezes/crackles/rhonchi. Good air movement.  Skin  No rash/lesions/breakdown  Psychiatry  Alert and oriented to person, place, and time  Abdomen  Normoactive bowel sounds, abdomen soft, non-tender and obese without evidence of hernia.  Back No CVA tenderness Genito Urinary  Vulva/vagina: minimal edema on vulva. Irritation of left vulva where it approximates with contralateral incision line. Slightly raised nodular area measuring 1cm on labia minora on left with leukoplakia (new).  Bladder/urethra:  No lesions or masses, well supported bladder  Vagina: atrophic, no lesions  Cervix: surgically absent  Uterus: surgically absen  Adnexa: no palpable masses. Rectal  deferred Extremities  No bilateral cyanosis, clubbing or edema.   PROCEDURE NOTE: Vulvar biopsy Patient provided verbal consent. Time out performed. Lesion prepped with betadine. 1cc of 2% lidocaine  infiltrated into lesion. 32mm punch taken. Hemostasis obtained with silver nitrate. EBL: <5PP Complications: none Specimen: left vulva for pathology  Donaciano Eva, MD  04/02/2016, 1:29 PM

## 2016-04-09 NOTE — Anesthesia Procedure Notes (Signed)
Procedure Name: Intubation Date/Time: 04/09/2016 4:11 PM Performed by: Gaston Islam EVETTE Pre-anesthesia Checklist: Patient identified, Emergency Drugs available, Suction available and Patient being monitored Patient Re-evaluated:Patient Re-evaluated prior to inductionOxygen Delivery Method: Circle system utilized Preoxygenation: Pre-oxygenation with 100% oxygen Intubation Type: IV induction Ventilation: Mask ventilation without difficulty Laryngoscope Size: Glidescope and 3 (Grade 1 view with Glidescope but Grade 3 airway very limited mouth opening ) Grade View: Grade I Tube type: Oral Tube size: 7.0 mm Number of attempts: 1 Placement Confirmation: ETT inserted through vocal cords under direct vision,  positive ETCO2,  CO2 detector and breath sounds checked- equal and bilateral Secured at: 22 cm Tube secured with: Tape Dental Injury: Teeth and Oropharynx as per pre-operative assessment

## 2016-04-10 ENCOUNTER — Telehealth: Payer: Self-pay | Admitting: *Deleted

## 2016-04-10 ENCOUNTER — Ambulatory Visit: Payer: PPO

## 2016-04-10 ENCOUNTER — Encounter (HOSPITAL_COMMUNITY): Payer: Self-pay | Admitting: Gynecologic Oncology

## 2016-04-10 NOTE — Telephone Encounter (Signed)
I have called and scheduled her post op appt for  4/3 at 11:15 am. Patient aware

## 2016-04-11 ENCOUNTER — Ambulatory Visit: Payer: PPO

## 2016-04-12 ENCOUNTER — Ambulatory Visit: Payer: PPO

## 2016-04-15 ENCOUNTER — Ambulatory Visit: Payer: PPO

## 2016-04-15 ENCOUNTER — Telehealth: Payer: Self-pay | Admitting: *Deleted

## 2016-04-15 NOTE — Telephone Encounter (Signed)
On 04-15-16 per Dr. Sondra Come told me fax consult note to [facility]

## 2016-04-16 ENCOUNTER — Ambulatory Visit: Payer: PPO

## 2016-04-17 ENCOUNTER — Ambulatory Visit: Payer: PPO

## 2016-04-18 ENCOUNTER — Ambulatory Visit: Payer: PPO

## 2016-04-19 ENCOUNTER — Ambulatory Visit: Payer: PPO

## 2016-04-22 ENCOUNTER — Ambulatory Visit: Payer: PPO

## 2016-04-23 ENCOUNTER — Ambulatory Visit: Payer: PPO

## 2016-04-23 NOTE — Progress Notes (Signed)
Consult Note: Gyn-Onc  Consult was requested by Dr. Elonda Husky for the evaluation of Doylene Canard 76 y.o. female   CC:  Chief Complaint  Patient presents with  . Vulvar Cancer    Assessment/Plan:  Ms. SHUNDA RABADI  is a 76 y.o.  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 Positive inferior (6 o'clock) margin on left vulvectomy.  1. Radiation to bilateral groins and vulva due to positive margin and positive left nodes, Discussed with Dr Sondra Come. 2. Low grade cellulitis of vulvar incision - prescribe keflex x 7 days 3. Chest nodules on CT scan - repeat CT in 3 months for follow-up.   HPI: Nasha Diss is a 76 year old woman who is seen in consultation at the request of Dr Elonda Husky for a right labial lesion.    The patient reports that she began feeling pruritis of the vulva in October, 2017. She was seen by her PCP who saw no lesion and prescribed estradiol.  The pruritis persisted and became worse and she began to feel a nodule in the area and therefore again saw her PCP followed by a referral to gynecologist, Dr Elonda Husky on 02/20/16. The nodular lesion was appreciated on the right vulva. No biopsy was taken at that time. Biopsy was performed on 02/22/16 and pathology from biopsy confirmed invasive SCC.   CT scan abdo/pelvis was negative for evidence of suspicious nodes or distant mets.  On 02/22/16 she underwent right radical vulvectomy and right inguinal lymphadenectomy with Dr Nancy Marus. Final pathology revealed: a 2.1cm moderately differentiated SCC of the right vulva. Margins were negative (closest was 3 o'clock, 33mm). Depth of invasion was 3mm. 1 of 6 inguinofemoral lymph nodes (right) was positive for metastatic disease.   CT chest on 04/01/16 showed: Multiple pulmonary nodules as detailed above, generally in the 26mm and less average diameter range although with 1 right lower lobe subpleural nodule measuring about 8  mm in diameter. These likely warrant surveillance. Hypodense right thyroid nodule, 1.1 cm in long axis. Consider further evaluation with thyroid ultrasound. If patient is clinically hyperthyroid, consider nuclear medicine thyroid uptake and scan. Stable hypodense lateral splenic lesion, likely a cyst.  Postoperatively after her first vulvectomy she developed irritation on the left vulvar lip. She saw [place] for evaluation in March and complained of this irritation. A nodular lesion was seen in the mid portion of the left labia minora in a "kissing" location to where the primary lesion had been. It was biopsied and confirmed as recurrent SCC of the vulva.  On 04/09/16 she was again taken to the OR, this time for a radical left vulvectomy. A decision was made to not perform left inguinal lymphadenectomy, so as to not delay radiation further (with an additional groin wound to heal), but to instead extend the radiation to both groins.  Pathology from this surgery confirmed a poorly differentiated squamous cell carcinoma of the left vulva with 60mm of invasion. The inferior 6 o'clock margin was "broadly" positive (though macroscopically this margin was grossly negative.  Interval Hx:  She feels better after this most recent surgery with respect to vulvar irritation. She denies fever or drainage.  Current Meds:  Outpatient Encounter Prescriptions as of 04/24/2016  Medication Sig  . aspirin 81 MG tablet Take 81 mg by mouth daily.  . Cholecalciferol (VITAMIN D3 PO) Take 1,000 Units by mouth 2 (two) times daily.  Marland Kitchen CINNAMON PO Take 2 capsules  by mouth daily. 2,000 mg daily   . furosemide (LASIX) 40 MG tablet TAKE ONE TABLET BY MOUTH ONCE DAILY.  Marland Kitchen gabapentin (NEURONTIN) 100 MG capsule TAKE 1 CAPSULE BY MOUTH THREE TIMES A DAY.  Marland Kitchen HYDROcodone-acetaminophen (NORCO) 5-325 MG tablet Take 1 tablet by mouth every 6 (six) hours as needed for moderate pain.  Marland Kitchen losartan (COZAAR) 50 MG tablet TAKE ONE TABLET BY MOUTH ONCE  DAILY.  . metFORMIN (GLUCOPHAGE) 500 MG tablet TAKE (1) TABLET BY MOUTH TWICE DAILY.  . Multiple Minerals-Vitamins (CALCIUM-MAGNESIUM-ZINC-D3 PO) Take 2 tablets by mouth 2 (two) times daily.  . potassium chloride SA (K-DUR,KLOR-CON) 20 MEQ tablet Take 1 tablet (20 mEq total) by mouth daily.   No facility-administered encounter medications on file as of 04/24/2016.     Allergy:  Allergies  Allergen Reactions  . Celebrex [Celecoxib]     GI upset   . Doxycycline Nausea Only    Gi upset  . Lodine [Etodolac] Nausea Only    Gi upset  . Nabumetone     (relafen)  . Tetracycline   . Xarelto [Rivaroxaban] Hives    Social Hx:   Social History   Social History  . Marital status: Married    Spouse name: N/A  . Number of children: 2  . Years of education: N/A   Occupational History  . Not on file.   Social History Main Topics  . Smoking status: Never Smoker  . Smokeless tobacco: Never Used  . Alcohol use No  . Drug use: No  . Sexual activity: Not on file   Other Topics Concern  . Not on file   Social History Narrative  . No narrative on file    Past Surgical Hx:  Past Surgical History:  Procedure Laterality Date  . ABDOMINAL HYSTERECTOMY     complete  . BACK SURGERY     upper  with plate frontal fusion and lower x 2  . COLONOSCOPY    . KNEE SURGERY Left    arthroscopy  . RADICAL VULVECTOMY Left 04/09/2016   Procedure: RADICAL LEFT VULVECTOMY;  Surgeon: Everitt Amber, MD;  Location: [facility];  Service: Gynecology;  Laterality: Left;  . TOE SURGERY Left   . TONSILLECTOMY    . TOTAL HIP ARTHROPLASTY Left 10/27/2013   Procedure: LEFT TOTAL HIP ARTHROPLASTY;  Surgeon: Tobi Bastos, MD;  Location: [facility];  Service: Orthopedics;  Laterality: Left;  . TOTAL KNEE ARTHROPLASTY Right   . VULVA /PERINEUM BIOPSY  02/22/2016  . VULVECTOMY Right 02/27/2016   Procedure: RADICAL RIGHT VULVECTOMY WITH RIGHT INGUINAL LYMPH NODE DISSECTION;  Surgeon: Nancy Marus, MD;  Location: [facility];   Service: Gynecology;  Laterality: Right;    Past Medical Hx:  Past Medical History:  Diagnosis Date  . Aortic stenosis, mild   . Aortic valve stenosis, mild   . Arthritis   . Cancer ([place])    vulvar   . Complication of anesthesia   . Diabetes mellitus without complication ([place])    type 2  . Heart murmur   . History of blood transfusion age 56 or 36 months old  . Hypertension   . IFG (impaired fasting glucose)   . PONV (postoperative nausea and vomiting)    years ago with back surgery-nausea  . Vitamin D deficiency     Past Gynecological History:  Remote hx of hysterectomy for fibroids. No hx of abnromal paps (last pap remote) No LMP recorded. Patient has had a hysterectomy.  Family Hx:  Family History  Problem Relation Age of Onset  . Hypertension Mother   . Hypertension Father   . Throat cancer Father   . Cancer Sister     Breast  . Hypertension Brother     Review of Systems:  Constitutional  Feels well,    ENT Normal appearing ears and nares bilaterally Skin/Breast  + vulvar pruritis. Cardiovascular  No chest pain, shortness of breath, or edema  Pulmonary  No cough or wheeze.  Gastro Intestinal  No nausea, vomitting, or diarrhoea. No bright red blood per rectum, no abdominal pain, change in bowel movement, or constipation.  Genito Urinary  No frequency, urgency, dysuria, no bleeding Musculo Skeletal  No myalgia, arthralgia, joint swelling or pain  Neurologic  No weakness, numbness, change in gait,  Psychology  No depression, anxiety, insomnia.   Vitals:  Blood pressure 133/63, pulse 88, temperature 97.9 F (36.6 C), temperature source Oral, resp. rate 18, weight 195 lb 8 oz (88.7 kg).  Physical Exam: WD in NAD Neck  Supple NROM, without any enlargements.  Lymph Node Survey No cervical supraclavicular or inguinal adenopathy. Right groin incision well healed. Cardiovascular  Pulse normal rate, regularity and rhythm. S1 and S2 normal.  Lungs  Clear  to auscultation bilateraly, without wheezes/crackles/rhonchi. Good air movement.  Skin  No rash/lesions/breakdown  Psychiatry  Alert and oriented to person, place, and time  Abdomen  Normoactive bowel sounds, abdomen soft, non-tender and obese without evidence of hernia.  Back No CVA tenderness Genito Urinary  Vulva/vagina: vulvar incision on left healing with some medial inflammation and erythema. No drainage. Mild wound separation inferiorally. There is some nodularity and roughness to the skin inferiorally at the 6 oclock margin - may correspond to either healing induration vs residual tumor  Bladder/urethra:  No lesions or masses, well supported bladder  Vagina: atrophic, no lesions  Cervix: surgically absent  Uterus: surgically absen  Adnexa: no palpable masses. Rectal  deferred Extremities  No bilateral cyanosis, clubbing or edema.  Donaciano Eva, MD  04/24/2016, 11:41 AM

## 2016-04-24 ENCOUNTER — Encounter: Payer: Self-pay | Admitting: Gynecologic Oncology

## 2016-04-24 ENCOUNTER — Ambulatory Visit: Payer: PPO

## 2016-04-24 ENCOUNTER — Encounter: Payer: Self-pay | Admitting: Radiation Oncology

## 2016-04-24 ENCOUNTER — Ambulatory Visit: Payer: PPO | Attending: Gynecologic Oncology | Admitting: Gynecologic Oncology

## 2016-04-24 VITALS — BP 133/63 | HR 88 | Temp 97.9°F | Resp 18 | Wt 195.5 lb

## 2016-04-24 DIAGNOSIS — C519 Malignant neoplasm of vulva, unspecified: Secondary | ICD-10-CM

## 2016-04-24 DIAGNOSIS — L0390 Cellulitis, unspecified: Secondary | ICD-10-CM | POA: Diagnosis not present

## 2016-04-24 DIAGNOSIS — Z7982 Long term (current) use of aspirin: Secondary | ICD-10-CM | POA: Insufficient documentation

## 2016-04-24 DIAGNOSIS — Z7984 Long term (current) use of oral hypoglycemic drugs: Secondary | ICD-10-CM | POA: Insufficient documentation

## 2016-04-24 DIAGNOSIS — C510 Malignant neoplasm of labium majus: Secondary | ICD-10-CM | POA: Insufficient documentation

## 2016-04-24 DIAGNOSIS — R918 Other nonspecific abnormal finding of lung field: Secondary | ICD-10-CM

## 2016-04-24 DIAGNOSIS — Z79899 Other long term (current) drug therapy: Secondary | ICD-10-CM | POA: Diagnosis not present

## 2016-04-24 DIAGNOSIS — Z7189 Other specified counseling: Secondary | ICD-10-CM | POA: Diagnosis not present

## 2016-04-24 MED ORDER — CEPHALEXIN 500 MG PO CAPS: 500.0000 mg | ORAL_CAPSULE | Freq: Four times a day (QID) | ORAL | 0 refills | Status: DC

## 2016-04-24 NOTE — Patient Instructions (Signed)
Take keflex 500mg  four times a day for 7 days Return to see me in 4 weeks.

## 2016-04-25 ENCOUNTER — Encounter: Payer: Self-pay | Admitting: Radiation Oncology

## 2016-04-25 ENCOUNTER — Ambulatory Visit: Payer: PPO

## 2016-04-26 ENCOUNTER — Ambulatory Visit: Payer: PPO

## 2016-04-29 ENCOUNTER — Ambulatory Visit: Payer: PPO

## 2016-04-30 ENCOUNTER — Ambulatory Visit: Payer: PPO

## 2016-05-01 ENCOUNTER — Telehealth: Payer: Self-pay | Admitting: Family Medicine

## 2016-05-01 ENCOUNTER — Ambulatory Visit: Payer: PPO

## 2016-05-01 NOTE — Telephone Encounter (Signed)
Patient wants to know when Dr. Richardson Landry says she is due to come back in the office to follow up with him?

## 2016-05-01 NOTE — Telephone Encounter (Signed)
Patient advised According to Dr Jeannine Kitten office note from last chronic visit 11/27/15 patient is to follow up in 3 months (around 02/27/16). Patient verbalized understanding and scheduled follow up with Dr Richardson Landry.

## 2016-05-01 NOTE — Telephone Encounter (Signed)
According to Dr Jeannine Kitten office note from last chronic visit 11/27/15 patient is to follow up in 3 months (around 02/27/16)

## 2016-05-02 ENCOUNTER — Ambulatory Visit: Payer: PPO

## 2016-05-03 ENCOUNTER — Emergency Department (HOSPITAL_COMMUNITY): Payer: PPO

## 2016-05-03 ENCOUNTER — Encounter (HOSPITAL_COMMUNITY): Payer: Self-pay | Admitting: Emergency Medicine

## 2016-05-03 ENCOUNTER — Telehealth: Payer: Self-pay | Admitting: *Deleted

## 2016-05-03 ENCOUNTER — Ambulatory Visit: Payer: PPO

## 2016-05-03 ENCOUNTER — Inpatient Hospital Stay (HOSPITAL_COMMUNITY)
Admission: EM | Admit: 2016-05-03 | Discharge: 2016-05-07 | DRG: 378 | Disposition: A | Discharge status: Home / Self Care | Payer: PPO | Attending: Family Medicine | Admitting: Family Medicine

## 2016-05-03 DIAGNOSIS — Z96651 Presence of right artificial knee joint: Secondary | ICD-10-CM | POA: Diagnosis present

## 2016-05-03 DIAGNOSIS — D509 Iron deficiency anemia, unspecified: Secondary | ICD-10-CM | POA: Diagnosis present

## 2016-05-03 DIAGNOSIS — D62 Acute posthemorrhagic anemia: Secondary | ICD-10-CM

## 2016-05-03 DIAGNOSIS — I10 Essential (primary) hypertension: Secondary | ICD-10-CM | POA: Diagnosis present

## 2016-05-03 DIAGNOSIS — K922 Gastrointestinal hemorrhage, unspecified: Secondary | ICD-10-CM | POA: Diagnosis present

## 2016-05-03 DIAGNOSIS — Z881 Allergy status to other antibiotic agents status: Secondary | ICD-10-CM

## 2016-05-03 DIAGNOSIS — E1342 Other specified diabetes mellitus with diabetic polyneuropathy: Secondary | ICD-10-CM | POA: Diagnosis not present

## 2016-05-03 DIAGNOSIS — Z79899 Other long term (current) drug therapy: Secondary | ICD-10-CM

## 2016-05-03 DIAGNOSIS — I350 Nonrheumatic aortic (valve) stenosis: Secondary | ICD-10-CM | POA: Diagnosis not present

## 2016-05-03 DIAGNOSIS — K5730 Diverticulosis of large intestine without perforation or abscess without bleeding: Secondary | ICD-10-CM | POA: Diagnosis present

## 2016-05-03 DIAGNOSIS — I5032 Chronic diastolic (congestive) heart failure: Secondary | ICD-10-CM | POA: Diagnosis present

## 2016-05-03 DIAGNOSIS — E559 Vitamin D deficiency, unspecified: Secondary | ICD-10-CM | POA: Diagnosis present

## 2016-05-03 DIAGNOSIS — Z808 Family history of malignant neoplasm of other organs or systems: Secondary | ICD-10-CM

## 2016-05-03 DIAGNOSIS — I2510 Atherosclerotic heart disease of native coronary artery without angina pectoris: Secondary | ICD-10-CM | POA: Diagnosis not present

## 2016-05-03 DIAGNOSIS — D638 Anemia in other chronic diseases classified elsewhere: Secondary | ICD-10-CM

## 2016-05-03 DIAGNOSIS — R0602 Shortness of breath: Secondary | ICD-10-CM | POA: Diagnosis not present

## 2016-05-03 DIAGNOSIS — K921 Melena: Principal | ICD-10-CM | POA: Diagnosis present

## 2016-05-03 DIAGNOSIS — I110 Hypertensive heart disease with heart failure: Secondary | ICD-10-CM | POA: Diagnosis not present

## 2016-05-03 DIAGNOSIS — R011 Cardiac murmur, unspecified: Secondary | ICD-10-CM | POA: Diagnosis present

## 2016-05-03 DIAGNOSIS — Z7982 Long term (current) use of aspirin: Secondary | ICD-10-CM

## 2016-05-03 DIAGNOSIS — Z981 Arthrodesis status: Secondary | ICD-10-CM

## 2016-05-03 DIAGNOSIS — Z96642 Presence of left artificial hip joint: Secondary | ICD-10-CM | POA: Diagnosis not present

## 2016-05-03 DIAGNOSIS — Z888 Allergy status to other drugs, medicaments and biological substances status: Secondary | ICD-10-CM | POA: Diagnosis not present

## 2016-05-03 DIAGNOSIS — L851 Acquired keratosis [keratoderma] palmaris et plantaris: Secondary | ICD-10-CM | POA: Diagnosis not present

## 2016-05-03 DIAGNOSIS — L609 Nail disorder, unspecified: Secondary | ICD-10-CM | POA: Diagnosis not present

## 2016-05-03 DIAGNOSIS — D649 Anemia, unspecified: Secondary | ICD-10-CM | POA: Diagnosis not present

## 2016-05-03 DIAGNOSIS — R918 Other nonspecific abnormal finding of lung field: Secondary | ICD-10-CM | POA: Diagnosis present

## 2016-05-03 DIAGNOSIS — E119 Type 2 diabetes mellitus without complications: Secondary | ICD-10-CM

## 2016-05-03 DIAGNOSIS — Z7984 Long term (current) use of oral hypoglycemic drugs: Secondary | ICD-10-CM

## 2016-05-03 DIAGNOSIS — K449 Diaphragmatic hernia without obstruction or gangrene: Secondary | ICD-10-CM | POA: Diagnosis present

## 2016-05-03 DIAGNOSIS — C519 Malignant neoplasm of vulva, unspecified: Secondary | ICD-10-CM | POA: Diagnosis present

## 2016-05-03 DIAGNOSIS — Z8249 Family history of ischemic heart disease and other diseases of the circulatory system: Secondary | ICD-10-CM

## 2016-05-03 DIAGNOSIS — Z90710 Acquired absence of both cervix and uterus: Secondary | ICD-10-CM

## 2016-05-03 LAB — CBC
HCT: 21.4 % — ABNORMAL LOW (ref 36.0–46.0)
HEMOGLOBIN: 6.5 g/dL — AB (ref 12.0–15.0)
MCH: 22.3 pg — ABNORMAL LOW (ref 26.0–34.0)
MCHC: 30.4 g/dL (ref 30.0–36.0)
MCV: 73.3 fL — AB (ref 78.0–100.0)
PLATELETS: 243 10*3/uL (ref 150–400)
RBC: 2.92 MIL/uL — AB (ref 3.87–5.11)
RDW: 15.1 % (ref 11.5–15.5)
WBC: 10 10*3/uL (ref 4.0–10.5)

## 2016-05-03 LAB — CBC WITH DIFFERENTIAL/PLATELET
Basophils Absolute: 0.1 10*3/uL (ref 0.0–0.1)
Basophils Relative: 0 %
EOS PCT: 0 %
Eosinophils Absolute: 0 10*3/uL (ref 0.0–0.7)
HEMATOCRIT: 24.6 % — AB (ref 36.0–46.0)
Hemoglobin: 7.5 g/dL — ABNORMAL LOW (ref 12.0–15.0)
LYMPHS ABS: 1.7 10*3/uL (ref 0.7–4.0)
LYMPHS PCT: 14 %
MCH: 22.5 pg — ABNORMAL LOW (ref 26.0–34.0)
MCHC: 30.5 g/dL (ref 30.0–36.0)
MCV: 73.7 fL — ABNORMAL LOW (ref 78.0–100.0)
Monocytes Absolute: 1.1 10*3/uL — ABNORMAL HIGH (ref 0.1–1.0)
Monocytes Relative: 9 %
Neutro Abs: 9.1 10*3/uL — ABNORMAL HIGH (ref 1.7–7.7)
Neutrophils Relative %: 76 %
PLATELETS: 321 10*3/uL (ref 150–400)
RBC: 3.34 MIL/uL — ABNORMAL LOW (ref 3.87–5.11)
RDW: 15.2 % (ref 11.5–15.5)
WBC: 11.9 10*3/uL — ABNORMAL HIGH (ref 4.0–10.5)

## 2016-05-03 LAB — URINALYSIS, ROUTINE W REFLEX MICROSCOPIC
Bilirubin Urine: NEGATIVE
Glucose, UA: NEGATIVE mg/dL
Hgb urine dipstick: NEGATIVE
KETONES UR: NEGATIVE mg/dL
NITRITE: NEGATIVE
PH: 5 (ref 5.0–8.0)
Protein, ur: 30 mg/dL — AB
Specific Gravity, Urine: 1.025 (ref 1.005–1.030)

## 2016-05-03 LAB — COMPREHENSIVE METABOLIC PANEL WITH GFR
ALT: 20 U/L (ref 14–54)
AST: 24 U/L (ref 15–41)
Albumin: 4 g/dL (ref 3.5–5.0)
Alkaline Phosphatase: 63 U/L (ref 38–126)
Anion gap: 10 (ref 5–15)
BUN: 27 mg/dL — ABNORMAL HIGH (ref 6–20)
CO2: 25 mmol/L (ref 22–32)
Calcium: 9.4 mg/dL (ref 8.9–10.3)
Chloride: 103 mmol/L (ref 101–111)
Creatinine, Ser: 0.87 mg/dL (ref 0.44–1.00)
GFR calc Af Amer: >60 mL/min
GFR calc non Af Amer: >60 mL/min
Glucose, Bld: 126 mg/dL — ABNORMAL HIGH (ref 65–99)
Potassium: 3.4 mmol/L — ABNORMAL LOW (ref 3.5–5.1)
Sodium: 138 mmol/L (ref 135–145)
Total Bilirubin: 0.4 mg/dL (ref 0.3–1.2)
Total Protein: 7.4 g/dL (ref 6.5–8.1)

## 2016-05-03 LAB — CBG MONITORING, ED: Glucose-Capillary: 114 mg/dL — ABNORMAL HIGH (ref 65–99)

## 2016-05-03 LAB — PREPARE RBC (CROSSMATCH)

## 2016-05-03 LAB — ABO/RH: ABO/RH(D): B POS

## 2016-05-03 LAB — TROPONIN I: Troponin I: <0.03 ng/mL (ref <0.03)

## 2016-05-03 MED ORDER — GABAPENTIN 100 MG PO CAPS
100.0000 mg | ORAL_CAPSULE | Freq: Three times a day (TID) | ORAL | Status: DC
  Administered 2016-05-03 – 2016-05-07 (×10): 100 mg via ORAL
  Filled 2016-05-03 (×10): qty 1

## 2016-05-03 MED ORDER — ONDANSETRON HCL 4 MG PO TABS: 4.0000 mg | ORAL_TABLET | Freq: Four times a day (QID) | ORAL | Status: DC | PRN

## 2016-05-03 MED ORDER — IOPAMIDOL (ISOVUE-370) INJECTION 76%
100.0000 mL | Freq: Once | INTRAVENOUS | Status: AC | PRN
  Administered 2016-05-03: 100 mL via INTRAVENOUS

## 2016-05-03 MED ORDER — SODIUM CHLORIDE 0.9 % IV SOLN: 10.0000 mL/h | Freq: Once | INTRAVENOUS | Status: DC

## 2016-05-03 MED ORDER — INSULIN ASPART 100 UNIT/ML ~~LOC~~ SOLN: 0.0000 [IU] | Freq: Three times a day (TID) | SUBCUTANEOUS | Status: DC

## 2016-05-03 MED ORDER — SODIUM CHLORIDE 0.9 % IV SOLN
INTRAVENOUS | Status: DC
  Administered 2016-05-03: 14:00:00 via INTRAVENOUS

## 2016-05-03 MED ORDER — PANTOPRAZOLE SODIUM 40 MG IV SOLR: 40.0000 mg | Freq: Two times a day (BID) | INTRAVENOUS | Status: DC

## 2016-05-03 MED ORDER — LOSARTAN POTASSIUM 50 MG PO TABS
50.0000 mg | ORAL_TABLET | Freq: Every day | ORAL | Status: DC
  Administered 2016-05-03 – 2016-05-07 (×4): 50 mg via ORAL
  Filled 2016-05-03 (×4): qty 1

## 2016-05-03 MED ORDER — HYDROCODONE-ACETAMINOPHEN 5-325 MG PO TABS: 1.0000 | ORAL_TABLET | Freq: Four times a day (QID) | ORAL | Status: DC | PRN

## 2016-05-03 MED ORDER — ONDANSETRON HCL 4 MG/2ML IJ SOLN: 4.0000 mg | Freq: Four times a day (QID) | INTRAMUSCULAR | Status: DC | PRN

## 2016-05-03 MED ORDER — SODIUM CHLORIDE 0.9 % IV BOLUS (SEPSIS)
500.0000 mL | Freq: Once | INTRAVENOUS | Status: AC
  Administered 2016-05-03: 500 mL via INTRAVENOUS

## 2016-05-03 MED ORDER — ACETAMINOPHEN 325 MG PO TABS: 650.0000 mg | ORAL_TABLET | Freq: Four times a day (QID) | ORAL | Status: DC | PRN

## 2016-05-03 MED ORDER — PANTOPRAZOLE SODIUM 40 MG IV SOLR
40.0000 mg | Freq: Two times a day (BID) | INTRAVENOUS | Status: DC
  Administered 2016-05-03 – 2016-05-07 (×7): 40 mg via INTRAVENOUS
  Filled 2016-05-03 (×7): qty 40

## 2016-05-03 MED ORDER — ACETAMINOPHEN 650 MG RE SUPP: 650.0000 mg | Freq: Four times a day (QID) | RECTAL | Status: DC | PRN

## 2016-05-03 MED ORDER — POTASSIUM CHLORIDE CRYS ER 20 MEQ PO TBCR
20.0000 meq | EXTENDED_RELEASE_TABLET | Freq: Every day | ORAL | Status: DC
  Administered 2016-05-03 – 2016-05-07 (×4): 20 meq via ORAL
  Filled 2016-05-03 (×4): qty 1

## 2016-05-03 NOTE — Telephone Encounter (Signed)
Patient walked in with c/o severe SOB and fatigue on minimal exertion. Patient states she feels like she has walked a mile just to get up and walk a little way. Patient it has been going on for a few days but is getting worse.  Advised to go straight to the ER for evaluation and treatment. Advised patient that she will most likely need cardiac work up and PE work up via the ER. Patient verbalized understanding and stated she was heading directly across the street to the ER.

## 2016-05-03 NOTE — Telephone Encounter (Signed)
I agree

## 2016-05-03 NOTE — H&P (Signed)
History and Physical  [name] XVQ:[id number] DOB: 01-Dec-1940 DOA: 05/03/2016  Referring physician: Dr Thurnell Garbe, ED physician PCP: Mickie Hillier, MD  Outpatient Specialists: none  Patient Coming From: home  Chief Complaint: Shortness of breath  HPI: [name] is a 76 y.o. female with a history of moderate aortic stenosis, diabetes type 2, hypertension, grade 1 diastolic heart failure on echocardiogram 10/2014, history of vulvar cancer. Patient seen for worsening shortness of breath over the past several days. Her symptoms have been getting worse. Her symptoms are worse with exertion and improved with rest. She denies fevers, chills, nausea, vomiting. She doesn't be to having black stools that has been intermittent over the past several weeks, more persistent over the past several days. He denies abdominal pain. Lightheaded and dizzy with exertion.   Emergency Department Course:  Blood work shows hemoglobin of 7.5. She has heme positive stool. She does have a mild elevated white count of 11.9. Urinalysis shows large white blood cells, but rare bacteria. Nitrite negative.  Review of Systems:   Pt denies any fevers, chills, nausea, vomiting, diarrhea, constipation, abdominal pain, shortness of breath, dyspnea on exertion, orthopnea, cough, wheezing, palpitations, headache, vision changes, lightheadedness, dizziness, melena, rectal bleeding.  Review of systems are otherwise negative  Past Medical History:  Diagnosis Date  . Aortic stenosis, mild   . Aortic valve stenosis, mild   . Arthritis   . Cancer ([place])    vulvar   . Complication of anesthesia   . Diabetes mellitus without complication ([place])    type 2  . Heart murmur   . History of blood transfusion age 35 or 88 months old  . Hypertension   . IFG (impaired fasting glucose)   . PONV (postoperative nausea and vomiting)    years ago with back surgery-nausea  . Vitamin D deficiency    Past Surgical History:  Procedure  Laterality Date  . ABDOMINAL HYSTERECTOMY     complete  . BACK SURGERY     upper  with plate frontal fusion and lower x 2  . COLONOSCOPY    . KNEE SURGERY Left    arthroscopy  . RADICAL VULVECTOMY Left 04/09/2016   Procedure: RADICAL LEFT VULVECTOMY;  Surgeon: Everitt Amber, MD;  Location: [facility];  Service: Gynecology;  Laterality: Left;  . TOE SURGERY Left   . TONSILLECTOMY    . TOTAL HIP ARTHROPLASTY Left 10/27/2013   Procedure: LEFT TOTAL HIP ARTHROPLASTY;  Surgeon: Tobi Bastos, MD;  Location: [facility];  Service: Orthopedics;  Laterality: Left;  . TOTAL KNEE ARTHROPLASTY Right   . VULVA /PERINEUM BIOPSY  02/22/2016  . VULVECTOMY Right 02/27/2016   Procedure: RADICAL RIGHT VULVECTOMY WITH RIGHT INGUINAL LYMPH NODE DISSECTION;  Surgeon: Nancy Marus, MD;  Location: [facility];  Service: Gynecology;  Laterality: Right;   Social History:  reports that she has never smoked. She has never used smokeless tobacco. She reports that she does not drink alcohol or use drugs. Patient lives at home  Allergies  Allergen Reactions  . Celebrex [Celecoxib]     GI upset   . Doxycycline Nausea Only    Gi upset  . Lodine [Etodolac] Nausea Only    Gi upset  . Nabumetone     (relafen)  . Tetracycline   . Xarelto [Rivaroxaban] Hives    Family History  Problem Relation Age of Onset  . Hypertension Mother   . Hypertension Father   . Throat cancer Father   . Cancer Sister  Breast  . Hypertension Brother       Prior to Admission medications   Medication Sig Start Date End Date Taking? Authorizing Provider  aspirin 81 MG tablet Take 81 mg by mouth daily.   Yes Historical Provider, MD  Cholecalciferol (VITAMIN D3 PO) Take 1,000 Units by mouth 2 (two) times daily.   Yes Historical Provider, MD  CINNAMON PO Take 2 capsules by mouth daily. 2,000 mg daily    Yes Historical Provider, MD  furosemide (LASIX) 40 MG tablet TAKE ONE TABLET BY MOUTH ONCE DAILY. 12/15/15  Yes Mikey Kirschner, MD    gabapentin (NEURONTIN) 100 MG capsule TAKE 1 CAPSULE BY MOUTH THREE TIMES A DAY. 03/14/16  Yes Mikey Kirschner, MD  HYDROcodone-acetaminophen (NORCO) 5-325 MG tablet Take 1 tablet by mouth every 6 (six) hours as needed for moderate pain. 04/09/16  Yes Lahoma Crocker, MD  losartan (COZAAR) 50 MG tablet TAKE ONE TABLET BY MOUTH ONCE DAILY. 12/15/15  Yes Mikey Kirschner, MD  metFORMIN (GLUCOPHAGE) 500 MG tablet TAKE (1) TABLET BY MOUTH TWICE DAILY. 03/14/16  Yes Mikey Kirschner, MD  Multiple Minerals-Vitamins (CALCIUM-MAGNESIUM-ZINC-D3 PO) Take 2 tablets by mouth 2 (two) times daily.   Yes Historical Provider, MD  potassium chloride SA (K-DUR,KLOR-CON) 20 MEQ tablet Take 1 tablet (20 mEq total) by mouth daily. 11/27/15  Yes Mikey Kirschner, MD    Physical Exam: BP 120/62   Pulse 97   Temp (!) 96.6 F (35.9 C) (Temporal)   Resp 16   Ht 5' 3.5" (1.613 m)   Wt 195 lb (88.5 kg)   SpO2 97%   BMI 34.00 kg/m   General:  Elderly Caucasian female. Awake and alert and oriented x3. No acute cardiopulmonary distress.  HEENT: Normocephalic atraumatic.  Right and left ears normal in appearance.  Pupils equal, round, reactive to light. Extraocular muscles are intact. Sclerae anicteric and noninjected.  Moist mucosal membranes. No mucosal lesions.  Neck: Neck supple without lymphadenopathy. No carotid bruits. No masses palpated.  Cardiovascular: Regular rate.  Grade 3/6 systolic murmur heard best over aortic area and radiates into right carotid. No rubs, gallops auscultated. No JVD.  Respiratory: Good respiratory effort with no wheezes, rales, rhonchi. Lungs clear to auscultation bilaterally.  No accessory muscle use. Abdomen: Soft, nontender, nondistended. Active bowel sounds. No masses or hepatosplenomegaly  Skin: No rashes, lesions, or ulcerations.  Dry, warm to touch. 2+ dorsalis pedis and radial pulses. Musculoskeletal: No calf or leg pain. All major joints not erythematous nontender.  No upper or  lower joint deformation.  Good ROM.  No contractures  Psychiatric: Intact judgment and insight. Pleasant and cooperative. Neurologic: No focal neurological deficits. Strength is 5/5 and symmetric in upper and lower extremities.  Cranial nerves II through XII are grossly intact.           Labs on Admission: I have personally reviewed following labs and imaging studies  CBC:  Recent Labs Lab 05/03/16 1131  WBC 11.9*  NEUTROABS 9.1*  HGB 7.5*  HCT 24.6*  MCV 73.7*  PLT 403   Basic Metabolic Panel:  Recent Labs Lab 05/03/16 1131  NA 138  K 3.4*  CL 103  CO2 25  GLUCOSE 126*  BUN 27*  CREATININE 0.87  CALCIUM 9.4   GFR: Estimated Creatinine Clearance: 58.7 mL/min (by C-G formula based on SCr of 0.87 mg/dL). Liver Function Tests:  Recent Labs Lab 05/03/16 1131  AST 24  ALT 20  ALKPHOS 63  BILITOT 0.4  PROT 7.4  ALBUMIN 4.0   No results for input(s): LIPASE, AMYLASE in the last 168 hours. No results for input(s): AMMONIA in the last 168 hours. Coagulation Profile: No results for input(s): INR, PROTIME in the last 168 hours. Cardiac Enzymes:  Recent Labs Lab 05/03/16 1131  TROPONINI <0.03   BNP (last 3 results) No results for input(s): PROBNP in the last 8760 hours. HbA1C: No results for input(s): HGBA1C in the last 72 hours. CBG:  Recent Labs Lab 05/03/16 1158  GLUCAP 114*   Lipid Profile: No results for input(s): CHOL, HDL, LDLCALC, TRIG, CHOLHDL, LDLDIRECT in the last 72 hours. Thyroid Function Tests: No results for input(s): TSH, T4TOTAL, FREET4, T3FREE, THYROIDAB in the last 72 hours. Anemia Panel: No results for input(s): VITAMINB12, FOLATE, FERRITIN, TIBC, IRON, RETICCTPCT in the last 72 hours. Urine analysis:    Component Value Date/Time   COLORURINE YELLOW 05/03/2016 1316   APPEARANCEUR HAZY (A) 05/03/2016 1316   LABSPEC 1.025 05/03/2016 1316   PHURINE 5.0 05/03/2016 1316   GLUCOSEU NEGATIVE 05/03/2016 1316   HGBUR NEGATIVE  05/03/2016 1316   BILIRUBINUR NEGATIVE 05/03/2016 1316   KETONESUR NEGATIVE 05/03/2016 1316   PROTEINUR 30 (A) 05/03/2016 1316   UROBILINOGEN 0.2 10/21/2013 1124   NITRITE NEGATIVE 05/03/2016 1316   LEUKOCYTESUR LARGE (A) 05/03/2016 1316   Sepsis Labs: @LABRCNTIP (procalcitonin:4,lacticidven:4) )No results found for this or any previous visit (from the past 240 hour(s)).   Radiological Exams on Admission: Dg Chest 2 View  Result Date: 05/03/2016 CLINICAL DATA:  Shortness of breath for 2-3 days EXAM: CHEST  2 VIEW COMPARISON:  Chest CT 04/01/2016 FINDINGS: The previously seen small bilateral pulmonary nodules cannot be appreciated by plain film. Heart is normal size. Lungs are clear. No effusions or acute bony abnormality. IMPRESSION: No active cardiopulmonary disease. Unable to visualize the previously seen small bilateral pulmonary nodules. These should be followed with CT. Electronically Signed   By: Rolm Baptise M.D.   On: 05/03/2016 11:54   Ct Angio Chest Pe W/cm &/or Wo Cm  Result Date: 05/03/2016 CLINICAL DATA:  75 year old female with shortness of breath for 2 days. Recently diagnosed and treated for ball vulva cancer in January and February. EXAM: CT ANGIOGRAPHY CHEST WITH CONTRAST TECHNIQUE: Multidetector CT imaging of the chest was performed using the standard protocol during bolus administration of intravenous contrast. Multiplanar CT image reconstructions and MIPs were obtained to evaluate the vascular anatomy. CONTRAST:  100 mL Isovue 370 COMPARISON:  Chest CT 04/01/2016. FINDINGS: Cardiovascular: Good contrast bolus timing in the pulmonary arterial tree. No focal filling defect identified in the pulmonary arteries to suggest acute pulmonary embolism. Cardiac size is at the upper limits of normal to mildly enlarged. No pericardial effusion. Negative visible aorta. There is evidence of left coronary artery atherosclerosis. Mediastinum/Nodes: Stable hypodense right thyroid nodule. No  mediastinal or hilar lymphadenopathy. Lungs/Pleura: Lower lung volumes. Major airways are patent. Perihilar and dependent atelectasis. Stable small superior segment right lower lobe subpleural nodule measuring 8-9 mm on series 7, image 41 small calcified granuloma along the left superior major fissure incidentally re - identified. Stable small lateral left lower lobe subpleural nodule on series 7, image 47. No pleural effusion.  No confluent or suspicious pulmonary opacity. Upper Abdomen: Negative visualized liver, spleen, pancreas, adrenal glands, kidneys, and bowel in the upper abdomen. Musculoskeletal: Disc and endplate degeneration in the thoracic spine. No acute or suspicious osseous lesion. Review of the MIP images confirms the above findings. IMPRESSION: 1.  No evidence  of acute pulmonary embolus. 2. Calcified coronary artery atherosclerosis. Negative visible aorta. 3. Pulmonary atelectasis with no other acute pulmonary process. The small subpleural pulmonary nodules seen by CT last month are stable. Electronically Signed   By: Genevie Ann M.D.   On: 05/03/2016 14:36    EKG: Independently reviewed.  Sinus rhythm with PVC.  Assessment/Plan: Principal Problem:   Symptomatic anemia Active Problems:   Essential hypertension, benign   Aortic valve stenosis, mild   GI bleed   Diabetes mellitus without complication [facility])    This patient was discussed with the ED physician, including pertinent vitals, physical exam findings, labs, and imaging.  We also discussed care given by the ED provider.  #1 symptomatic anemia  Observation on MedSurg  Transfuse 1 unit  CBC following transfusion and tomorrow morning #2 GI bleed  Likely upper GI. Start Protonix  Consult GI  Hold aspirin  Clear liquid diet #3 hypertension  Continue antihypertensives #4 moderate aortic stenosis  We'll need to be cautious with fluid balance  Hold Lasix for now #5 type 2 diabetes  Hold metformin as recently had  CTA  Scale insulin  DVT prophylaxis: SCD Consultants: GI Code Status: Full Family Communication: Husband  Disposition Plan: likely return to home   Truett Mainland, DO [facility] Pager [phone]  If 7PM-7AM, please contact night-coverage [email or web address] Password [email or web address]

## 2016-05-03 NOTE — ED Triage Notes (Signed)
Pt c/o sob with activity x 2 days. Has been on a new med x 2 weeks. Pt has vulvar cancer. Mild labored with walking into triage. No distress noted. Pt denies pain/cough or any other symptoms.

## 2016-05-03 NOTE — ED Triage Notes (Signed)
Sent by Dr Malachy Moan office after calling and reports exertional shortness of breath

## 2016-05-03 NOTE — ED Provider Notes (Signed)
[facility] DEPT Provider Note   CSN: [id number] Arrival date & time: 05/03/16  1117     History   Chief Complaint Chief Complaint  Patient presents with  . Shortness of Breath    HPI [name] is a 76 y.o. female.  HPI  Pt was seen at 1215. Per pt and her family, c/o gradual onset and worsening of persistent generalized weakness/fatigue and SOB for the past 3 days. Has been associated with "dark" stools.  Denies fevers, no abd pain, no N/V/D, no CP/palpitations, no cough, no back pain.   Past Medical History:  Diagnosis Date  . Aortic stenosis, mild   . Aortic valve stenosis, mild   . Arthritis   . Cancer ([place])    vulvar   . Complication of anesthesia   . Diabetes mellitus without complication ([place])    type 2  . Heart murmur   . History of blood transfusion age 8 or 59 months old  . Hypertension   . IFG (impaired fasting glucose)   . PONV (postoperative nausea and vomiting)    years ago with back surgery-nausea  . Vitamin D deficiency     Patient Active Problem List   Diagnosis Date Noted  . Cellulitis 04/24/2016  . Vulva cancer ([place]) 02/27/2016  . UTI (urinary tract infection) 02/26/2016  . Labial lesion 02/10/2016  . Hypoestrogenism 08/25/2015  . Osteopenia 08/25/2015  . Encounter for long-term opiate analgesic use 07/25/2014  . Osteoarthritis of left hip 10/27/2013  . S/P total hip arthroplasty 10/27/2013  . Degenerative arthritis 12/28/2012  . Aortic valve stenosis, mild 07/30/2012  . Vitamin D deficiency 07/30/2012  . Prediabetes 07/06/2012  . Essential hypertension, benign 07/06/2012  . Hyperlipemia 07/06/2012  . COLONIC POLYPS, HX OF 02/27/2009  . COLITIS 08/23/2008  . CARDIAC MURMUR 08/02/2008  . DIARRHEA 08/02/2008    Past Surgical History:  Procedure Laterality Date  . ABDOMINAL HYSTERECTOMY     complete  . BACK SURGERY     upper  with plate frontal fusion and lower x 2  . COLONOSCOPY    . KNEE SURGERY Left    arthroscopy  .  RADICAL VULVECTOMY Left 04/09/2016   Procedure: RADICAL LEFT VULVECTOMY;  Surgeon: Everitt Amber, MD;  Location: [facility];  Service: Gynecology;  Laterality: Left;  . TOE SURGERY Left   . TONSILLECTOMY    . TOTAL HIP ARTHROPLASTY Left 10/27/2013   Procedure: LEFT TOTAL HIP ARTHROPLASTY;  Surgeon: Tobi Bastos, MD;  Location: [facility];  Service: Orthopedics;  Laterality: Left;  . TOTAL KNEE ARTHROPLASTY Right   . VULVA /PERINEUM BIOPSY  02/22/2016  . VULVECTOMY Right 02/27/2016   Procedure: RADICAL RIGHT VULVECTOMY WITH RIGHT INGUINAL LYMPH NODE DISSECTION;  Surgeon: Nancy Marus, MD;  Location: [facility];  Service: Gynecology;  Laterality: Right;    OB History    No data available       Home Medications    Prior to Admission medications   Medication Sig Start Date End Date Taking? Authorizing Provider  aspirin 81 MG tablet Take 81 mg by mouth daily.    Historical Provider, MD  cephALEXin (KEFLEX) 500 MG capsule Take 1 capsule (500 mg total) by mouth 4 (four) times daily. 04/24/16   Everitt Amber, MD  Cholecalciferol (VITAMIN D3 PO) Take 1,000 Units by mouth 2 (two) times daily.    Historical Provider, MD  CINNAMON PO Take 2 capsules by mouth daily. 2,000 mg daily     Historical Provider, MD  furosemide (  LASIX) 40 MG tablet TAKE ONE TABLET BY MOUTH ONCE DAILY. 12/15/15   Mikey Kirschner, MD  gabapentin (NEURONTIN) 100 MG capsule TAKE 1 CAPSULE BY MOUTH THREE TIMES A DAY. 03/14/16   Mikey Kirschner, MD  HYDROcodone-acetaminophen (NORCO) 5-325 MG tablet Take 1 tablet by mouth every 6 (six) hours as needed for moderate pain. 04/09/16   Lahoma Crocker, MD  losartan (COZAAR) 50 MG tablet TAKE ONE TABLET BY MOUTH ONCE DAILY. 12/15/15   Mikey Kirschner, MD  metFORMIN (GLUCOPHAGE) 500 MG tablet TAKE (1) TABLET BY MOUTH TWICE DAILY. 03/14/16   Mikey Kirschner, MD  Multiple Minerals-Vitamins (CALCIUM-MAGNESIUM-ZINC-D3 PO) Take 2 tablets by mouth 2 (two) times daily.    Historical Provider, MD  potassium  chloride SA (K-DUR,KLOR-CON) 20 MEQ tablet Take 1 tablet (20 mEq total) by mouth daily. 11/27/15   Mikey Kirschner, MD    Family History Family History  Problem Relation Age of Onset  . Hypertension Mother   . Hypertension Father   . Throat cancer Father   . Cancer Sister     Breast  . Hypertension Brother     Social History Social History  Substance Use Topics  . Smoking status: Never Smoker  . Smokeless tobacco: Never Used  . Alcohol use No     Allergies   Celebrex [celecoxib]; Doxycycline; Lodine [etodolac]; Nabumetone; Tetracycline; and Xarelto [rivaroxaban]   Review of Systems Review of Systems ROS: Statement: All systems negative except as marked or noted in the HPI; Constitutional: Negative for fever and chills. +generalized weakness/fatigue. ; ; Eyes: Negative for eye pain, redness and discharge. ; ; ENMT: Negative for ear pain, hoarseness, nasal congestion, sinus pressure and sore throat. ; ; Cardiovascular: Negative for chest pain, palpitations, diaphoresis, and peripheral edema. ; ; Respiratory: +SOB. Negative for cough, wheezing and stridor. ; ; Gastrointestinal: +"dark" stools. Negative for nausea, vomiting, diarrhea, abdominal pain, blood in stool, hematemesis, jaundice and rectal bleeding. . ; ; Genitourinary: Negative for dysuria, flank pain and hematuria. ; ; Musculoskeletal: Negative for back pain and neck pain. Negative for swelling and trauma.; ; Skin: Negative for pruritus, rash, abrasions, blisters, bruising and skin lesion.; ; Neuro: Negative for headache, lightheadedness and neck stiffness. Negative for altered level of consciousness, altered mental status, extremity weakness, paresthesias, involuntary movement, seizure and syncope.       Physical Exam Updated Vital Signs BP 121/76 (BP Location: Left Arm)   Pulse (!) 124   Temp (!) 96.6 F (35.9 C) (Temporal)   Resp (!) 25   Ht 5' 3.5" (1.613 m)   Wt 195 lb (88.5 kg)   SpO2 100%   BMI 34.00 kg/m    Physical Exam 1220: Physical examination:  Nursing notes reviewed; Vital signs and O2 SAT reviewed;  Constitutional: Well developed, Well nourished, Well hydrated, In no acute distress; Head:  Normocephalic, atraumatic; Eyes: EOMI, PERRL, No scleral icterus. Conjunctiva pale.; ENMT: Mouth and pharynx normal, Mucous membranes moist; Neck: Supple, Full range of motion, No lymphadenopathy; Cardiovascular: Tachycardic rate and rhythm, No gallop; Respiratory: Breath sounds clear & equal bilaterally, No wheezes.  Speaking full sentences with ease, Normal respiratory effort/excursion; Chest: Nontender, Movement normal; Abdomen: Soft, Nontender, Nondistended, Normal bowel sounds. Rectal exam performed w/permission of pt and ED RN chaperone present.  Anal tone normal.  Non-tender, small amount of soft dark stool in rectal vault, heme positive.  No fissures, +external hemorrhoid without thrombosis or bleeding, no palp masses.; Genitourinary: No CVA tenderness; Extremities: Pulses normal, No  tenderness, No edema, No calf edema or asymmetry.; Neuro: AA&Ox3, Major CN grossly intact.  Speech clear. No gross focal motor or sensory deficits in extremities.; Skin: Color pale, Warm, Dry.   ED Treatments / Results  Labs (all labs ordered are listed, but only abnormal results are displayed)   EKG  EKG Interpretation  Date/Time:  Friday May 03 2016 11:32:02 EDT Ventricular Rate:  109 PR Interval:    QRS Duration: 82 QT Interval:  324 QTC Calculation: 436 R Axis:   13 Text Interpretation:  Sinus tachycardia with occasional Premature ventricular complexes Otherwise within normal limits Confirmed by Carmin Muskrat  MD [phone]) on 05/03/2016 11:39:59 AM       Radiology   Procedures Procedures (including critical care time)  Medications Ordered in ED Medications - No data to display   Initial Impression / Assessment and Plan / ED Course  I have reviewed the triage vital signs and the nursing  notes.  Pertinent labs & imaging results that were available during my care of the patient were reviewed by me and considered in my medical decision making (see chart for details).  MDM Reviewed: previous chart, nursing note and vitals Reviewed previous: labs and ECG Interpretation: labs, ECG, x-ray and CT scan Total time providing critical care: 30-74 minutes. This excludes time spent performing separately reportable procedures and services. Consults: admitting MD   Results for orders placed or performed during the hospital encounter of 05/03/16  CBC with Differential  Result Value Ref Range   WBC 11.9 (H) 4.0 - 10.5 K/uL   RBC 3.34 (L) 3.87 - 5.11 MIL/uL   Hemoglobin 7.5 (L) 12.0 - 15.0 g/dL   HCT 24.6 (L) 36.0 - 46.0 %   MCV 73.7 (L) 78.0 - 100.0 fL   MCH 22.5 (L) 26.0 - 34.0 pg   MCHC 30.5 30.0 - 36.0 g/dL   RDW 15.2 11.5 - 15.5 %   Platelets 321 150 - 400 K/uL   Neutrophils Relative % 76 %   Neutro Abs 9.1 (H) 1.7 - 7.7 K/uL   Lymphocytes Relative 14 %   Lymphs Abs 1.7 0.7 - 4.0 K/uL   Monocytes Relative 9 %   Monocytes Absolute 1.1 (H) 0.1 - 1.0 K/uL   Eosinophils Relative 0 %   Eosinophils Absolute 0.0 0.0 - 0.7 K/uL   Basophils Relative 0 %   Basophils Absolute 0.1 0.0 - 0.1 K/uL  Troponin I  Result Value Ref Range   Troponin I <0.03 <0.03 ng/mL  Comprehensive metabolic panel  Result Value Ref Range   Sodium 138 135 - 145 mmol/L   Potassium 3.4 (L) 3.5 - 5.1 mmol/L   Chloride 103 101 - 111 mmol/L   CO2 25 22 - 32 mmol/L   Glucose, Bld 126 (H) 65 - 99 mg/dL   BUN 27 (H) 6 - 20 mg/dL   Creatinine, Ser 0.87 0.44 - 1.00 mg/dL   Calcium 9.4 8.9 - 10.3 mg/dL   Total Protein 7.4 6.5 - 8.1 g/dL   Albumin 4.0 3.5 - 5.0 g/dL   AST 24 15 - 41 U/L   ALT 20 14 - 54 U/L   Alkaline Phosphatase 63 38 - 126 U/L   Total Bilirubin 0.4 0.3 - 1.2 mg/dL   GFR calc non Af Amer >60 >60 mL/min   GFR calc Af Amer >60 >60 mL/min   Anion gap 10 5 - 15  Urinalysis, Routine w reflex  microscopic  Result Value Ref Range   Color,  Urine YELLOW YELLOW   APPearance HAZY (A) CLEAR   Specific Gravity, Urine 1.025 1.005 - 1.030   pH 5.0 5.0 - 8.0   Glucose, UA NEGATIVE NEGATIVE mg/dL   Hgb urine dipstick NEGATIVE NEGATIVE   Bilirubin Urine NEGATIVE NEGATIVE   Ketones, ur NEGATIVE NEGATIVE mg/dL   Protein, ur 30 (A) NEGATIVE mg/dL   Nitrite NEGATIVE NEGATIVE   Leukocytes, UA LARGE (A) NEGATIVE   RBC / HPF 6-30 0 - 5 RBC/hpf   WBC, UA TOO NUMEROUS TO COUNT 0 - 5 WBC/hpf   Bacteria, UA RARE (A) NONE SEEN   Squamous Epithelial / LPF 0-5 (A) NONE SEEN   Mucous PRESENT    Hyaline Casts, UA PRESENT    Non Squamous Epithelial 0-5 (A) NONE SEEN  CBG monitoring, ED  Result Value Ref Range   Glucose-Capillary 114 (H) 65 - 99 mg/dL  Type and screen  Result Value Ref Range   ABO/RH(D) B POS    Antibody Screen NEG    Sample Expiration 05/06/2016    Dg Chest 2 View Result Date: 05/03/2016 CLINICAL DATA:  Shortness of breath for 2-3 days EXAM: CHEST  2 VIEW COMPARISON:  Chest CT 04/01/2016 FINDINGS: The previously seen small bilateral pulmonary nodules cannot be appreciated by plain film. Heart is normal size. Lungs are clear. No effusions or acute bony abnormality. IMPRESSION: No active cardiopulmonary disease. Unable to visualize the previously seen small bilateral pulmonary nodules. These should be followed with CT. Electronically Signed   By: Rolm Baptise M.D.   On: 05/03/2016 11:54   Ct Angio Chest Pe W/cm &/or Wo Cm Result Date: 05/03/2016 CLINICAL DATA:  76 year old female with shortness of breath for 2 days. Recently diagnosed and treated for ball vulva cancer in January and February. EXAM: CT ANGIOGRAPHY CHEST WITH CONTRAST TECHNIQUE: Multidetector CT imaging of the chest was performed using the standard protocol during bolus administration of intravenous contrast. Multiplanar CT image reconstructions and MIPs were obtained to evaluate the vascular anatomy. CONTRAST:  100 mL  Isovue 370 COMPARISON:  Chest CT 04/01/2016. FINDINGS: Cardiovascular: Good contrast bolus timing in the pulmonary arterial tree. No focal filling defect identified in the pulmonary arteries to suggest acute pulmonary embolism. Cardiac size is at the upper limits of normal to mildly enlarged. No pericardial effusion. Negative visible aorta. There is evidence of left coronary artery atherosclerosis. Mediastinum/Nodes: Stable hypodense right thyroid nodule. No mediastinal or hilar lymphadenopathy. Lungs/Pleura: Lower lung volumes. Major airways are patent. Perihilar and dependent atelectasis. Stable small superior segment right lower lobe subpleural nodule measuring 8-9 mm on series 7, image 41 small calcified granuloma along the left superior major fissure incidentally re - identified. Stable small lateral left lower lobe subpleural nodule on series 7, image 47. No pleural effusion.  No confluent or suspicious pulmonary opacity. Upper Abdomen: Negative visualized liver, spleen, pancreas, adrenal glands, kidneys, and bowel in the upper abdomen. Musculoskeletal: Disc and endplate degeneration in the thoracic spine. No acute or suspicious osseous lesion. Review of the MIP images confirms the above findings. IMPRESSION: 1.  No evidence of acute pulmonary embolus. 2. Calcified coronary artery atherosclerosis. Negative visible aorta. 3. Pulmonary atelectasis with no other acute pulmonary process. The small subpleural pulmonary nodules seen by CT last month are stable. Electronically Signed   By: Genevie Ann M.D.   On: 05/03/2016 14:36    Results for ELIANNAH, HINDE (MRN [id number]) as of 05/03/2016 13:11  Ref. Range 04/19/2014 08:54 02/23/2016 11:00 02/28/2016 05:28 04/05/2016 14:06 05/03/2016  11:31  Hemoglobin Latest Ref Range: 12.0 - 15.0 g/dL 12.3 8.5 (L) 7.6 (L) 9.0 (L) 7.5 (L)  HCT Latest Ref Range: 36.0 - 46.0 % 38.5 26.3 (L) 24.5 (L) 30.3 (L) 24.6 (L)    1525:  H/H lower than previous; will transfuse PRBC's. Possible  UTI on Udip, pt does not report dysuria; UC pending. Dx and testing d/w pt and family.  Questions answered.  Verb understanding, agreeable to admit.  T/C to [facility] Dr. Nehemiah Settle, case discussed, including:  HPI, pertinent PM/SHx, VS/PE, dx testing, ED course and treatment:  Agreeable to admit.   Final Clinical Impressions(s) / ED Diagnoses   Final diagnoses:  None    New Prescriptions New Prescriptions   No medications on file     Francine Graven, DO 05/08/16 1026

## 2016-05-04 ENCOUNTER — Encounter (HOSPITAL_COMMUNITY): Payer: Self-pay | Admitting: *Deleted

## 2016-05-04 ENCOUNTER — Encounter (HOSPITAL_COMMUNITY): Payer: Self-pay | Admitting: Anesthesiology

## 2016-05-04 ENCOUNTER — Encounter (HOSPITAL_COMMUNITY)
Admission: EM | Disposition: A | Discharge status: Home / Self Care | Payer: Self-pay | Source: Home / Self Care | Attending: Family Medicine

## 2016-05-04 DIAGNOSIS — I2510 Atherosclerotic heart disease of native coronary artery without angina pectoris: Secondary | ICD-10-CM | POA: Diagnosis not present

## 2016-05-04 DIAGNOSIS — E559 Vitamin D deficiency, unspecified: Secondary | ICD-10-CM | POA: Diagnosis not present

## 2016-05-04 DIAGNOSIS — Z8249 Family history of ischemic heart disease and other diseases of the circulatory system: Secondary | ICD-10-CM | POA: Diagnosis not present

## 2016-05-04 DIAGNOSIS — C519 Malignant neoplasm of vulva, unspecified: Secondary | ICD-10-CM | POA: Diagnosis not present

## 2016-05-04 DIAGNOSIS — Z96642 Presence of left artificial hip joint: Secondary | ICD-10-CM | POA: Diagnosis not present

## 2016-05-04 DIAGNOSIS — I5032 Chronic diastolic (congestive) heart failure: Secondary | ICD-10-CM | POA: Diagnosis not present

## 2016-05-04 DIAGNOSIS — R0602 Shortness of breath: Secondary | ICD-10-CM | POA: Diagnosis not present

## 2016-05-04 DIAGNOSIS — Z7982 Long term (current) use of aspirin: Secondary | ICD-10-CM | POA: Diagnosis not present

## 2016-05-04 DIAGNOSIS — Z808 Family history of malignant neoplasm of other organs or systems: Secondary | ICD-10-CM | POA: Diagnosis not present

## 2016-05-04 DIAGNOSIS — E119 Type 2 diabetes mellitus without complications: Secondary | ICD-10-CM | POA: Diagnosis not present

## 2016-05-04 DIAGNOSIS — K449 Diaphragmatic hernia without obstruction or gangrene: Secondary | ICD-10-CM | POA: Diagnosis not present

## 2016-05-04 DIAGNOSIS — R918 Other nonspecific abnormal finding of lung field: Secondary | ICD-10-CM | POA: Diagnosis not present

## 2016-05-04 DIAGNOSIS — Z888 Allergy status to other drugs, medicaments and biological substances status: Secondary | ICD-10-CM | POA: Diagnosis not present

## 2016-05-04 DIAGNOSIS — I110 Hypertensive heart disease with heart failure: Secondary | ICD-10-CM | POA: Diagnosis not present

## 2016-05-04 DIAGNOSIS — K921 Melena: Secondary | ICD-10-CM | POA: Diagnosis not present

## 2016-05-04 DIAGNOSIS — D649 Anemia, unspecified: Secondary | ICD-10-CM

## 2016-05-04 DIAGNOSIS — I350 Nonrheumatic aortic (valve) stenosis: Secondary | ICD-10-CM | POA: Diagnosis not present

## 2016-05-04 DIAGNOSIS — K922 Gastrointestinal hemorrhage, unspecified: Secondary | ICD-10-CM | POA: Diagnosis present

## 2016-05-04 DIAGNOSIS — Z881 Allergy status to other antibiotic agents status: Secondary | ICD-10-CM | POA: Diagnosis not present

## 2016-05-04 DIAGNOSIS — Z96651 Presence of right artificial knee joint: Secondary | ICD-10-CM | POA: Diagnosis not present

## 2016-05-04 DIAGNOSIS — D638 Anemia in other chronic diseases classified elsewhere: Secondary | ICD-10-CM | POA: Diagnosis not present

## 2016-05-04 DIAGNOSIS — Z79899 Other long term (current) drug therapy: Secondary | ICD-10-CM | POA: Diagnosis not present

## 2016-05-04 DIAGNOSIS — K5730 Diverticulosis of large intestine without perforation or abscess without bleeding: Secondary | ICD-10-CM | POA: Diagnosis not present

## 2016-05-04 DIAGNOSIS — D509 Iron deficiency anemia, unspecified: Secondary | ICD-10-CM | POA: Diagnosis not present

## 2016-05-04 DIAGNOSIS — Z7984 Long term (current) use of oral hypoglycemic drugs: Secondary | ICD-10-CM | POA: Diagnosis not present

## 2016-05-04 DIAGNOSIS — D62 Acute posthemorrhagic anemia: Secondary | ICD-10-CM | POA: Diagnosis not present

## 2016-05-04 HISTORY — PX: ESOPHAGOGASTRODUODENOSCOPY: SHX5428

## 2016-05-04 LAB — CBC
HCT: 24.1 % — ABNORMAL LOW (ref 36.0–46.0)
HCT: 25.2 % — ABNORMAL LOW (ref 36.0–46.0)
Hemoglobin: 7.7 g/dL — ABNORMAL LOW (ref 12.0–15.0)
Hemoglobin: 8 g/dL — ABNORMAL LOW (ref 12.0–15.0)
MCH: 24.1 pg — AB (ref 26.0–34.0)
MCH: 24 pg — ABNORMAL LOW (ref 26.0–34.0)
MCHC: 32 g/dL (ref 30.0–36.0)
MCHC: 31.7 g/dL (ref 30.0–36.0)
MCV: 75.5 fL — ABNORMAL LOW (ref 78.0–100.0)
MCV: 75.7 fL — ABNORMAL LOW (ref 78.0–100.0)
PLATELETS: 209 10*3/uL (ref 150–400)
PLATELETS: 228 10*3/uL (ref 150–400)
RBC: 3.19 MIL/uL — ABNORMAL LOW (ref 3.87–5.11)
RBC: 3.33 MIL/uL — ABNORMAL LOW (ref 3.87–5.11)
RDW: 15.3 % (ref 11.5–15.5)
RDW: 15.3 % (ref 11.5–15.5)
WBC: 6 10*3/uL (ref 4.0–10.5)
WBC: 6.8 10*3/uL (ref 4.0–10.5)

## 2016-05-04 LAB — GLUCOSE, CAPILLARY
GLUCOSE-CAPILLARY: 102 mg/dL — AB (ref 65–99)
GLUCOSE-CAPILLARY: 104 mg/dL — AB (ref 65–99)
GLUCOSE-CAPILLARY: 99 mg/dL (ref 65–99)
Glucose-Capillary: 91 mg/dL (ref 65–99)

## 2016-05-04 LAB — BASIC METABOLIC PANEL
Anion gap: 6 (ref 5–15)
BUN: 18 mg/dL (ref 6–20)
CO2: 26 mmol/L (ref 22–32)
CREATININE: 0.71 mg/dL (ref 0.44–1.00)
Calcium: 8.7 mg/dL — ABNORMAL LOW (ref 8.9–10.3)
Chloride: 108 mmol/L (ref 101–111)
GFR calc Af Amer: >60 mL/min (ref >60)
GLUCOSE: 109 mg/dL — AB (ref 65–99)
POTASSIUM: 3.3 mmol/L — AB (ref 3.5–5.1)
SODIUM: 140 mmol/L (ref 135–145)

## 2016-05-04 LAB — OCCULT BLOOD, POC DEVICE: FECAL OCCULT BLD: POSITIVE — AB

## 2016-05-04 SURGERY — EGD (ESOPHAGOGASTRODUODENOSCOPY)
Anesthesia: Moderate Sedation

## 2016-05-04 MED ORDER — LIDOCAINE VISCOUS 2 % MT SOLN
OROMUCOSAL | Status: DC | PRN
  Administered 2016-05-04: 1 via OROMUCOSAL

## 2016-05-04 MED ORDER — SODIUM CHLORIDE 0.9 % IV SOLN: INTRAVENOUS | Status: DC

## 2016-05-04 MED ORDER — SIMETHICONE 40 MG/0.6ML PO SUSP
ORAL | Status: AC
  Filled 2016-05-04: qty 30

## 2016-05-04 MED ORDER — ONDANSETRON HCL 4 MG/2ML IJ SOLN
INTRAMUSCULAR | Status: AC
  Filled 2016-05-04: qty 2

## 2016-05-04 MED ORDER — PEG 3350-KCL-NA BICARB-NACL 420 G PO SOLR
4000.0000 mL | Freq: Once | ORAL | Status: AC
  Administered 2016-05-04: 4000 mL via ORAL
  Filled 2016-05-04 (×2): qty 4000

## 2016-05-04 MED ORDER — LACTATED RINGERS IV SOLN
INTRAVENOUS | Status: DC
  Administered 2016-05-04: 10:00:00 via INTRAVENOUS

## 2016-05-04 MED ORDER — MIDAZOLAM HCL 5 MG/5ML IJ SOLN
INTRAMUSCULAR | Status: DC | PRN
  Administered 2016-05-04: 1 mg via INTRAVENOUS
  Administered 2016-05-04: 2 mg via INTRAVENOUS

## 2016-05-04 MED ORDER — LIDOCAINE VISCOUS 2 % MT SOLN
OROMUCOSAL | Status: AC
  Filled 2016-05-04: qty 15

## 2016-05-04 MED ORDER — MIDAZOLAM HCL 5 MG/5ML IJ SOLN
INTRAMUSCULAR | Status: AC
  Filled 2016-05-04: qty 10

## 2016-05-04 MED ORDER — MEPERIDINE HCL 100 MG/ML IJ SOLN
INTRAMUSCULAR | Status: AC
  Filled 2016-05-04: qty 2

## 2016-05-04 MED ORDER — ONDANSETRON HCL 4 MG/2ML IJ SOLN
INTRAMUSCULAR | Status: DC | PRN
  Administered 2016-05-04: 4 mg via INTRAVENOUS

## 2016-05-04 MED ORDER — MEPERIDINE HCL 100 MG/ML IJ SOLN
INTRAMUSCULAR | Status: DC | PRN
  Administered 2016-05-04 (×2): 25 mg via INTRAVENOUS

## 2016-05-04 MED ORDER — SODIUM CHLORIDE 0.9 % IV SOLN
INTRAVENOUS | Status: DC
  Administered 2016-05-04: 20:00:00 via INTRAVENOUS

## 2016-05-04 NOTE — Consult Note (Signed)
Referring Provider: No ref. provider found Primary Care Physician:  Mickie Hillier, MD Primary Gastroenterologist:  Dr.Rourk  Reason for Consultation:  Melena; Hemoccult positive  HPI: Pleasant 76 year old lady with recently diagnosed Stage IIIa vulvar cancer admitted to the hospital with anemia and melena / Hemoccult-positive stool. Patient states she developed increasing fatigue over the past 1 week and has developed intermittent black tarry stools. No iron or Pepto-Bismol therapy.  Has not had any hematemesis or hematochezia.    Hemoglobin 6.5 in the ED. Hemoccult-positive:  8.0 after a 1 unit transfusion.  She remains hemodynamically stable.  Furthermore, patient denies reflux symptoms, odynophagia, dysphagia, early satiety nausea, vomiting, abdominal pain or other change in bowel function. No history of NSAID use aside from one baby aspirin daily.  No history of peptic ulcer disease or prior upper GI tract evaluation.  I saw this nice lady back in 2010 for diarrhea. She underwent a colonoscopy which revealed lymphocytic colitis, diverticulosis and a small adenoma which was removed.  Recent oncology evaluation for newly diagnosed vulvar cancer included a CT abdomen and pelvis which demonstrated no obvious metastatic disease.   Small nonspecific pulmonary nodules were seen.  Past Medical History:  Diagnosis Date  . Aortic stenosis, mild   . Aortic valve stenosis, mild   . Arthritis   . Cancer ([place])    vulvar   . Complication of anesthesia   . Diabetes mellitus without complication ([place])    type 2  . Heart murmur   . History of blood transfusion age 25 or 48 months old  . Hypertension   . IFG (impaired fasting glucose)   . PONV (postoperative nausea and vomiting)    years ago with back surgery-nausea  . Vitamin D deficiency     Past Surgical History:  Procedure Laterality Date  . ABDOMINAL HYSTERECTOMY     complete  . BACK SURGERY     upper  with plate frontal fusion  and lower x 2  . COLONOSCOPY    . KNEE SURGERY Left    arthroscopy  . RADICAL VULVECTOMY Left 04/09/2016   Procedure: RADICAL LEFT VULVECTOMY;  Surgeon: Everitt Amber, MD;  Location: [facility];  Service: Gynecology;  Laterality: Left;  . TOE SURGERY Left   . TONSILLECTOMY    . TOTAL HIP ARTHROPLASTY Left 10/27/2013   Procedure: LEFT TOTAL HIP ARTHROPLASTY;  Surgeon: Tobi Bastos, MD;  Location: [facility];  Service: Orthopedics;  Laterality: Left;  . TOTAL KNEE ARTHROPLASTY Right   . VULVA /PERINEUM BIOPSY  02/22/2016  . VULVECTOMY Right 02/27/2016   Procedure: RADICAL RIGHT VULVECTOMY WITH RIGHT INGUINAL LYMPH NODE DISSECTION;  Surgeon: Nancy Marus, MD;  Location: [facility];  Service: Gynecology;  Laterality: Right;    Prior to Admission medications   Medication Sig Start Date End Date Taking? Authorizing Provider  aspirin 81 MG tablet Take 81 mg by mouth daily.   Yes Historical Provider, MD  Cholecalciferol (VITAMIN D3 PO) Take 1,000 Units by mouth 2 (two) times daily.   Yes Historical Provider, MD  CINNAMON PO Take 2 capsules by mouth daily. 2,000 mg daily    Yes Historical Provider, MD  furosemide (LASIX) 40 MG tablet TAKE ONE TABLET BY MOUTH ONCE DAILY. 12/15/15  Yes Mikey Kirschner, MD  gabapentin (NEURONTIN) 100 MG capsule TAKE 1 CAPSULE BY MOUTH THREE TIMES A DAY. 03/14/16  Yes Mikey Kirschner, MD  HYDROcodone-acetaminophen (NORCO) 5-325 MG tablet Take 1 tablet by mouth every 6 (six) hours as  needed for moderate pain. 04/09/16  Yes Lahoma Crocker, MD  losartan (COZAAR) 50 MG tablet TAKE ONE TABLET BY MOUTH ONCE DAILY. 12/15/15  Yes Mikey Kirschner, MD  metFORMIN (GLUCOPHAGE) 500 MG tablet TAKE (1) TABLET BY MOUTH TWICE DAILY. 03/14/16  Yes Mikey Kirschner, MD  Multiple Minerals-Vitamins (CALCIUM-MAGNESIUM-ZINC-D3 PO) Take 2 tablets by mouth 2 (two) times daily.   Yes Historical Provider, MD  potassium chloride SA (K-DUR,KLOR-CON) 20 MEQ tablet Take 1 tablet (20 mEq total) by mouth daily.  11/27/15  Yes Mikey Kirschner, MD    Current Facility-Administered Medications  Medication Dose Route Frequency Provider Last Rate Last Dose  . acetaminophen (TYLENOL) tablet 650 mg  650 mg Oral Q6H PRN Truett Mainland, DO       Or  . acetaminophen (TYLENOL) suppository 650 mg  650 mg Rectal Q6H PRN Tanna Savoy Stinson, DO      . gabapentin (NEURONTIN) capsule 100 mg  100 mg Oral TID Tanna Savoy Stinson, DO   100 mg at 05/04/16 1547  . HYDROcodone-acetaminophen (NORCO/VICODIN) 5-325 MG per tablet 1 tablet  1 tablet Oral Q6H PRN Tanna Savoy Stinson, DO      . insulin aspart (novoLOG) injection 0-15 Units  0-15 Units Subcutaneous TID WC Tanna Savoy Stinson, DO      . lactated ringers infusion   Intravenous Continuous Samuella Cota, MD 50 mL/hr at 05/04/16 [phone]    . losartan (COZAAR) tablet 50 mg  50 mg Oral Daily Tanna Savoy Stinson, DO   50 mg at 05/04/16 6295  . ondansetron (ZOFRAN) tablet 4 mg  4 mg Oral Q6H PRN Tanna Savoy Stinson, DO       Or  . ondansetron [facility]) injection 4 mg  4 mg Intravenous Q6H PRN Tanna Savoy Stinson, DO      . pantoprazole (PROTONIX) injection 40 mg  40 mg Intravenous Q12H Tanna Savoy Stinson, DO   40 mg at 05/04/16 0934  . potassium chloride SA (K-DUR,KLOR-CON) CR tablet 20 mEq  20 mEq Oral Daily Tanna Savoy Stinson, DO   20 mEq at 05/04/16 2841    Allergies as of 05/03/2016 - Review Complete 05/03/2016  Allergen Reaction Noted  . Celebrex [celecoxib]  10/26/2014  . Doxycycline Nausea Only 05/21/2012  . Lodine [etodolac] Nausea Only 05/21/2012  . Nabumetone    . Tetracycline    . Xarelto [rivaroxaban] Hives 10/26/2014    Family History  Problem Relation Age of Onset  . Hypertension Mother   . Hypertension Father   . Throat cancer Father   . Cancer Sister     Breast  . Hypertension Brother     Social History   Social History  . Marital status: Married    Spouse name: N/A  . Number of children: 2  . Years of education: N/A   Occupational History  . Not on file.    Social History Main Topics  . Smoking status: Never Smoker  . Smokeless tobacco: Never Used  . Alcohol use No  . Drug use: No  . Sexual activity: Not on file   Other Topics Concern  . Not on file   Social History Narrative  . No narrative on file    Review of Systems:  As in history of present illness  Physical Exam: Vital signs in last 24 hours: Temp:  [97.9 F (36.6 C)-99.2 F (37.3 C)] 98.7 F (37.1 C) (04/14 1300) Pulse Rate:  [74-93] 74 (04/14 1300) Resp:  [18-20] 18 (04/14  1300) BP: (106-138)/(57-74) 106/57 (04/14 1300) SpO2:  [97 %-100 %] 99 % (04/14 1300) Weight:  [195 lb (88.5 kg)] 195 lb (88.5 kg) (04/13 1708) Last BM Date: 05/04/16 General:  Pale, alert and conversant. pleasant and cooperative in NAD Mouth:  No deformity or lesions, dentition normal. Neck:  Supple; no masses or thyromegaly. Lungs:  Clear throughout to auscultation.   No wheezes, crackles, or rhonchi. No acute distress. Heart:  Regular rate and rhythm; 3- 4/6 systolic ejection murmur. Abdomen:  Nondistended. Normal bowel sounds, without guarding, and without rebound.    Intake/Output from previous day: 04/13 0701 - 04/14 0700 In: 910 [Blood:410; IV Piggyback:500] Out: -  Intake/Output this shift: Total I/O In: 240 [P.O.:240] Out: -   Lab Results:  Recent Labs  05/03/16 1822 05/04/16 0552 05/04/16 1146  WBC 10.0 6.0 6.8  HGB 6.5* 7.7* 8.0*  HCT 21.4* 24.1* 25.2*  PLT 243 209 228   BMET  Recent Labs  05/03/16 1131 05/04/16 0552  NA 138 140  K 3.4* 3.3*  CL 103 108  CO2 25 26  GLUCOSE 126* 109*  BUN 27* 18  CREATININE 0.87 0.71  CALCIUM 9.4 8.7*   LFT  Recent Labs  05/03/16 1131  PROT 7.4  ALBUMIN 4.0  AST 24  ALT 20  ALKPHOS 63  BILITOT 0.4     Impression:  Pleasant 76 year old lady recently diagnosed with stage IIIa vulvar cancer admitted to the hospital with a history of melena and significant anemia. Otherwise, patient is devoid of any GI tract  symptoms.  She may well have an upper GI tract source for bleeding. BUN notably elevated. Small bowel or right colon etiology not absolutely excluded. Any number of potential causes including occult metastatic disease would remain in the differential at this time.  History of colonic adenoma removed some 8 years ago. Known diverticulosis.  Agree with PPI therapy empirically.    Recommendations:  I have offered the patient a diagnostic EGD today.  The risks, benefits, limitations, alternatives and imponderables have been reviewed with the patient. Potential for esophageal dilation, biopsy, etc. have also been reviewed.  Questions have been answered. All parties agreeable.  Further recommendations to follow.        Notice:  This dictation was prepared with Dragon dictation along with smaller phrase technology. Any transcriptional errors that result from this process are unintentional and may not be corrected upon review.

## 2016-05-04 NOTE — Progress Notes (Signed)
PROGRESS NOTE  ELLISSA Campbell MLY:[id number] DOB: 1940/09/19 DOA: 05/03/2016 PCP: Mickie Hillier, MD  Brief Narrative: 48yow PMH DM type 2, vulvar cancer with plans for radiation soon, presented to ED with SOB for several days, black stools. Admitted for symptomatic anemia, suspected UGIB  Assessment/Plan 1. Symptomatic microcytic anemia with h/o dark stools, presumed UGIB; new problem with further workup planned. No NSAIDs other than daily ASA.  Now asymptomatic s/p 1 U PRBC with appropriate rise in Hgb.  NPO with meds, consult GI for further evaluation, likely EGD.  Continue PPI.  2. Hypokalemia, minor problem, no workup planned  Replace potassium  3. Anemia of chronic disease. 4.  5. Abnormal u/a. Asymptomatic. No treatment indicated. 6.  7. Vulvar cancer. s/p radial vulvectomy 03/2016. Radiation therapy planned soon.  8. DM type 2, established problem, stable.  Hold metformin, continue SSI.  9. Coronary artery atherosclerosis. Asymptomatic.  10. Known aortic stenosis, mild, established problem, stable.  DVT prophylaxis: SCDs Code Status: full Family Communication: none Disposition Plan: home    Murray Hodgkins, MD  [facility] Direct contact: [phone] --[place]ia [place]  --[email or web address]; password [email or web address]  7PM-7AM contact night coverage as above 05/04/2016, 9:41 AM  LOS: 0 days   Consultants:  GI  Procedures:    Antimicrobials:    Interval history/Subjective: Feels better, no dizziness, no pain. Had some jello and juice this AM. Several dark stools since admission.  Objective: Vitals:   05/03/16 2015 05/03/16 2310 05/03/16 2325 05/04/16 0518  BP: 111/60 130/68 133/63 136/69  Pulse: 85 78 77 78  Resp: 18 18 18 18   Temp: 99.2 F (37.3 C) 98.7 F (37.1 C) 98.6 F (37 C) 98 F (36.7 C)  TempSrc: Oral Oral Oral Oral  SpO2: 100% 100% 100% 99%  Weight:      Height:        Intake/Output Summary (Last 24 hours) at 05/04/16 0941 Last data  filed at 05/03/16 2325  Gross per 24 hour  Intake              910 ml  Output                0 ml  Net              910 ml     Filed Weights   05/03/16 1127 05/03/16 1708  Weight: 88.5 kg (195 lb) 88.5 kg (195 lb)    Exam:    Constitutional: appears calm, comfortable CV: RRR, 3/6 holosystolic murmur RUSB, no r/g. No LE edema Resp: CTA bilaterally, no w/r/r. Normal resp effort. Abd: soft, nontender, nondistended Psych: grossly normal mood and affect, speech fluent and appropriate Musculoskeletal: lifts both legs off the bed, tone and strength bilateral legs grossly normal. ENT: lips and tongue appear normal, grossly normal hearing. Eyes: lids appear normal.  I have personally reviewed the following:   Labs:  u/a TNTC WBC, positive LE  Hgb up to 7.7  K+ 3.3, normal creatinine.  Imaging studies:  CT chest no PE. Coronary artery atherosclerosis.  CXR independently reviewed, no acute disease.  Medical tests:  EKG independently reviewed: ST, no acute changes  Scheduled Meds: . gabapentin  100 mg Oral TID  . insulin aspart  0-15 Units Subcutaneous TID WC  . losartan  50 mg Oral Daily  . pantoprazole (PROTONIX) IV  40 mg Intravenous Q12H  . potassium chloride SA  20 mEq Oral Daily   Continuous Infusions: . lactated ringers 50 mL/hr at 05/04/16  0934    Principal Problem:   Symptomatic anemia Active Problems:   Essential hypertension, benign   Aortic valve stenosis, mild   GI bleed   Diabetes mellitus without complication (HCC)   LOS: 0 days

## 2016-05-04 NOTE — Op Note (Signed)
[facility] Patient Name: [name] Procedure Date: 05/04/2016 6:01 PM MRN: [id number] Date of Birth: Feb 14, 1940 Attending MD: Norvel Richards , MD CSN: [id number] Age: 76 Admit Type: Inpatient Procedure:                Upper GI endoscopy - diagnostic Indications:              Acute post hemorrhagic anemia, Melena Providers:                Norvel Richards, MD, Lurline Del, RN, Bonnetta Barry, Technician Referring MD:              Medicines:                Midazolam 3 mg IV, Meperidine 50 mg IV, Ondansetron                            4 mg IV Complications:            No immediate complications. Estimated Blood Loss:     Estimated blood loss: none. Procedure:                Pre-Anesthesia Assessment:                           - Prior to the procedure, a History and Physical                            was performed, and patient medications and                            allergies were reviewed. The patient's tolerance of                            previous anesthesia was also reviewed. The risks                            and benefits of the procedure and the sedation                            options and risks were discussed with the patient.                            All questions were answered, and informed consent                            was obtained. Prior Anticoagulants: The patient has                            taken no previous anticoagulant or antiplatelet                            agents. ASA Grade Assessment: III - A patient with  severe systemic disease. After reviewing the risks                            and benefits, the patient was deemed in                            satisfactory condition to undergo the procedure.                           After obtaining informed consent, the endoscope was                            passed under direct vision. Throughout the   procedure, the patient's blood pressure, pulse, and                            oxygen saturations were monitored continuously. The                            [id number] ([id number]) was introduced through the                            mouth, and advanced to the fourth part of duodenum.                            The upper GI endoscopy was accomplished without                            difficulty. The patient tolerated the procedure                            well. The upper GI endoscopy was accomplished                            without difficulty. The patient tolerated the                            procedure well. Scope In: 6:13:57 PM Scope Out: 6:20:09 PM Total Procedure Duration: 0 hours 6 minutes 12 seconds  Findings:      The examined esophagus was normal.      A small hiatal hernia was present.      The exam was otherwise without abnormality.      The duodenal bulb, first portion of the duodenum, second portion of the       duodenum, third portion of the duodenum and fourth portion of the       duodenum were normal. Angela well-seen and appeared normal as well. No       blood in the upper GI tract. Impression:               - Normal esophagus.                           - Small hiatal hernia.                           -  The examination was otherwise normal.                           - Normal duodenal bulb, first portion of the                            duodenum, second portion of the duodenum, third                            portion of the duodenum and fourth portion of the                            duodenum.                           - No specimens collected. No explanation for GI                            bleeding based on today's procedural findings. Moderate Sedation:      Moderate (conscious) sedation was administered by the endoscopy nurse       and supervised by the endoscopist. The following parameters were       monitored: oxygen saturation, heart rate, blood  pressure, respiratory       rate, EKG, adequacy of pulmonary ventilation, and response to care.       Total physician intraservice time was 16 minutes. Recommendation:           - Return patient to hospital ward for ongoing care.                           - Clear liquid diet.                           - Perform a colonoscopy tomorrow. Further                            recommendations to follow.                           - Continue present medications. Procedure Code(s):        --- Professional ---                           [phone], Esophagogastroduodenoscopy, flexible,                            transoral; diagnostic, including collection of                            specimen(s) by brushing or washing, when performed                            (separate procedure)                           99152, Moderate sedation services provided by the  same physician or other qualified health care                            professional performing the diagnostic or                            therapeutic service that the sedation supports,                            requiring the presence of an independent trained                            observer to assist in the monitoring of the                            patient's level of consciousness and physiological                            status; initial 15 minutes of intraservice time,                            patient age 30 years or older Diagnosis Code(s):        --- Professional ---                           K44.9, Diaphragmatic hernia without obstruction or                            gangrene                           D62, Acute posthemorrhagic anemia                           K92.1, Melena (includes Hematochezia) CPT copyright 2016 American Medical Association. All rights reserved. The codes documented in this report are preliminary and upon coder review may  be revised to meet current compliance requirements. Cristopher Estimable.  Danial Hlavac, MD Norvel Richards, MD 05/04/2016 6:28:24 PM This report has been signed electronically. Number of Addenda: 0

## 2016-05-05 ENCOUNTER — Encounter (HOSPITAL_COMMUNITY)
Admission: EM | Disposition: A | Discharge status: Home / Self Care | Payer: Self-pay | Source: Home / Self Care | Attending: Family Medicine

## 2016-05-05 DIAGNOSIS — D638 Anemia in other chronic diseases classified elsewhere: Secondary | ICD-10-CM

## 2016-05-05 DIAGNOSIS — K5730 Diverticulosis of large intestine without perforation or abscess without bleeding: Secondary | ICD-10-CM

## 2016-05-05 HISTORY — PX: COLONOSCOPY: SHX5424

## 2016-05-05 LAB — GLUCOSE, CAPILLARY
Glucose-Capillary: 96 mg/dL (ref 65–99)
Glucose-Capillary: 96 mg/dL (ref 65–99)
Glucose-Capillary: 88 mg/dL (ref 65–99)
Glucose-Capillary: 77 mg/dL (ref 65–99)
Glucose-Capillary: 101 mg/dL — ABNORMAL HIGH (ref 65–99)

## 2016-05-05 LAB — CBC
HCT: 26.1 % — ABNORMAL LOW (ref 36.0–46.0)
HEMOGLOBIN: 8 g/dL — AB (ref 12.0–15.0)
MCH: 23.5 pg — ABNORMAL LOW (ref 26.0–34.0)
MCHC: 30.7 g/dL (ref 30.0–36.0)
MCV: 76.8 fL — ABNORMAL LOW (ref 78.0–100.0)
Platelets: 230 10*3/uL (ref 150–400)
RBC: 3.4 MIL/uL — ABNORMAL LOW (ref 3.87–5.11)
RDW: 15.6 % — AB (ref 11.5–15.5)
WBC: 6.6 10*3/uL (ref 4.0–10.5)

## 2016-05-05 LAB — URINE CULTURE

## 2016-05-05 SURGERY — COLONOSCOPY
Anesthesia: Moderate Sedation

## 2016-05-05 MED ORDER — ONDANSETRON HCL 4 MG/2ML IJ SOLN
INTRAMUSCULAR | Status: DC | PRN
  Administered 2016-05-05: 4 mg via INTRAVENOUS

## 2016-05-05 MED ORDER — MEPERIDINE HCL 100 MG/ML IJ SOLN
INTRAMUSCULAR | Status: DC | PRN
  Administered 2016-05-05 (×2): 25 mg via INTRAVENOUS

## 2016-05-05 MED ORDER — MIDAZOLAM HCL 5 MG/5ML IJ SOLN
INTRAMUSCULAR | Status: AC
  Filled 2016-05-05: qty 10

## 2016-05-05 MED ORDER — ONDANSETRON HCL 4 MG/2ML IJ SOLN
INTRAMUSCULAR | Status: AC
  Filled 2016-05-05: qty 2

## 2016-05-05 MED ORDER — MEPERIDINE HCL 100 MG/ML IJ SOLN
INTRAMUSCULAR | Status: AC
  Filled 2016-05-05: qty 2

## 2016-05-05 MED ORDER — MIDAZOLAM HCL 5 MG/5ML IJ SOLN
INTRAMUSCULAR | Status: DC | PRN
  Administered 2016-05-05: 1 mg via INTRAVENOUS
  Administered 2016-05-05: 2 mg via INTRAVENOUS

## 2016-05-05 NOTE — Progress Notes (Signed)
PROGRESS NOTE  [name] WER:[id number] DOB: 1940/11/22 DOA: 05/03/2016 PCP: Mickie Hillier, MD  Brief Narrative: 6yow PMH DM type 2, vulvar cancer with plans for radiation soon, presented to ED with SOB for several days, black stools. Admitted for symptomatic anemia, suspected UGIB  Assessment/Plan 1. Symptomatic microcytic anemia with h/o dark stools, presumed GIB but EGD and colonoscopy this admission both unremarkable. No evidence of ongoing bleeding. Hgb remains stable. No NSAIDs other than daily ASA. S/p 1 U PRBC with appropriate rise in Hgb.  D/w Dr. Gala Romney, he plans diabetic diet for lunch, then clear liquids for dinner, then small bowel video capsule 4/16.  2. Anemia of chronic disease. Stable.   3. DM type 2, established problem, remains stable.  Continue SSI.  4. Vulvar cancer. s/p radial vulvectomy 03/2016. Radiation therapy planned soon.  5. Coronary artery atherosclerosis.   6. Known aortic stenosis, mild, established problem, stable.  DVT prophylaxis: SCDs Code Status: full Family Communication: none Disposition Plan: home    Murray Hodgkins, MD  [facility] Direct contact: [phone] --[address]ia [place]  --[email or web address]; password [email or web address]  7PM-7AM contact night coverage as above 05/05/2016, 12:46 PM  LOS: 1 day   Consultants:  GI  Procedures:  EGD 4/14 Impression:               - Normal esophagus.                           - Small hiatal hernia.                           - The examination was otherwise normal.                           - Normal duodenal bulb, first portion of the                            duodenum, second portion of the duodenum, third                            portion of the duodenum and fourth portion of the                            duodenum.                           - No specimens collected. No explanation for GI                            bleeding based on today's procedural findings.   Colonoscopy  4/15 Impression:               - Diverticulosis in the sigmoid colon and in the                            descending colon.                           - The examination was otherwise normal on direct  and retroflexion views.                           - No specimens collected. Negative EGD Overt GI                            bleeding of obscure etiology. Evaluation of small                            intestine now needed  Antimicrobials:    Interval history/Subjective: Feeling fine, no pain. Eating ok.   Objective: Vitals:   05/05/16 0640 05/05/16 0855 05/05/16 0856 05/05/16 1016  BP: (!) 94/51  124/67 (!) 111/49  Pulse: 70  70 78  Resp: 18 16 16 16   Temp: 98 F (36.7 C)  98 F (36.7 C) 97.7 F (36.5 C)  TempSrc: Oral  Oral Oral  SpO2: 94%  100% 98%  Weight:      Height:        Intake/Output Summary (Last 24 hours) at 05/05/16 1246 Last data filed at 05/05/16 0800  Gross per 24 hour  Intake           362.67 ml  Output                0 ml  Net           362.67 ml     Filed Weights   05/03/16 1127 05/03/16 1708  Weight: 88.5 kg (195 lb) 88.5 kg (195 lb)    Exam: General: appears calm and comfortable CV: RRR 2/6 holosystolic murmur, no r/g. No LE edema Respiratory: CTA bilaterally, no w/r/r. Normal respiratory effort. Abdomen: soft, nt Psychiatric: speech fluent and clear, grossly normal mood and affect  I have personally reviewed the following:   Labs:  Hgb stable at 8.0  Plts WNL  Imaging studies:    Medical tests:  EGD and colonoscopy reports reviewed and discussed with Dr. Gala Romney.  Scheduled Meds: . gabapentin  100 mg Oral TID  . insulin aspart  0-15 Units Subcutaneous TID WC  . losartan  50 mg Oral Daily  . meperidine      . midazolam      . ondansetron      . pantoprazole (PROTONIX) IV  40 mg Intravenous Q12H  . potassium chloride SA  20 mEq Oral Daily   Continuous Infusions: . lactated ringers 50 mL/hr at  05/04/16 9622    Principal Problem:   Symptomatic anemia Active Problems:   Essential hypertension, benign   Aortic valve stenosis, mild   GI bleed   Diabetes mellitus without complication (HCC)   GIB (gastrointestinal bleeding)   LOS: 1 day

## 2016-05-05 NOTE — Op Note (Signed)
[facility] Patient Name: [name] Procedure Date: 05/05/2016 8:02 AM MRN: [id number] Date of Birth: Jun 19, 1940 Attending MD: Norvel Richards , MD CSN: [id number] Age: 76 Admit Type: Inpatient Procedure:                ileo-colonoscopy - diagnostic Indications:              Heme positive stool, Iron deficiency anemia;                            negative EGD yesterday; hemoglobin stable at 8.0                            this morning Providers:                Norvel Richards, MD, Janeece Riggers, RN, Nelda Severe, RN Referring MD:              Medicines:                Midazolam 3 mg IV, Meperidine 50 mg IV, Ondansetron                            4 mg IV Complications:            No immediate complications. Estimated Blood Loss:     Estimated blood loss: none. Procedure:                Pre-Anesthesia Assessment:                           - Prior to the procedure, a History and Physical                            was performed, and patient medications and                            allergies were reviewed. The patient's tolerance of                            previous anesthesia was also reviewed. The risks                            and benefits of the procedure and the sedation                            options and risks were discussed with the patient.                            All questions were answered, and informed consent                            was obtained. Prior Anticoagulants: The patient has  taken no previous anticoagulant or antiplatelet                            agents. ASA Grade Assessment: III - A patient with                            severe systemic disease. After reviewing the risks                            and benefits, the patient was deemed in                            satisfactory condition to undergo the procedure.                           After obtaining informed consent, the  colonoscope                            was passed under direct vision. Throughout the                            procedure, the patient's blood pressure, pulse, and                            oxygen saturations were monitored continuously. The                            [id number] ([id number]) scope was introduced through                            the anus and advanced to the 5 cm into the ileum.                            The terminal ileum, ileocecal valve, appendiceal                            orifice, and rectum were photographed. The terminal                            ileum, ileocecal valve, appendiceal orifice, and                            rectum were photographed. The entire colon was well                            visualized. The colonoscopy was performed without                            difficulty. The quality of the bowel preparation                            was adequate. Scope In: 9:11:54 AM Scope Out: 9:33:11 AM Scope Withdrawal Time: 0 hours 15 minutes 18 seconds  Total Procedure Duration:  0 hours 21 minutes 17 seconds  Findings:      The perianal and digital rectal examinations were normal.      Scattered medium-mouthed diverticula were found in the sigmoid colon and       descending colon.      The exam was otherwise without abnormality on direct and retroflexion       views. The distal 5 cm of terminal ileal mucosa also appeared normal.       The was no blood in the lower GI tract. Impression:               - Diverticulosis in the sigmoid colon and in the                            descending colon.                           - The examination was otherwise normal on direct                            and retroflexion views.                           - No specimens collected. Negative EGD Overt GI                            bleeding of obscure etiology. Evaluation of small                            intestine now needed Moderate Sedation:      Moderate (conscious)  sedation was administered by the endoscopy nurse       and supervised by the endoscopist. The following parameters were       monitored: oxygen saturation, heart rate, blood pressure, respiratory       rate, EKG, adequacy of pulmonary ventilation, and response to care.       Total physician intraservice time was 27 minutes. Recommendation:           - Return patient to hospital ward for ongoing care.                           - diabetic (ADA) diet for lunch. Back to clear                            liquids for supper. Plan for video capsule study of                            small intestine tomorrow. further recommendations                            to follow.                           - Continue present medications.                           - No repeat colonoscopy.                           -  Return to GI office (date not yet determined). I                            reviewed GI workup thus far with multiple family                            members and showed them images from her colonoscopy                            today. Procedure Code(s):        --- Professional ---                           [phone], Colonoscopy, flexible; diagnostic, including                            collection of specimen(s) by brushing or washing,                            when performed (separate procedure)                           99152, Moderate sedation services provided by the                            same physician or other qualified health care                            professional performing the diagnostic or                            therapeutic service that the sedation supports,                            requiring the presence of an independent trained                            observer to assist in the monitoring of the                            patient's level of consciousness and physiological                            status; initial 15 minutes of intraservice time,                             patient age 66 years or older                           [phone], Moderate sedation services; each additional                            15 minutes intraservice time Diagnosis Code(s):        --- Professional ---  R19.5, Other fecal abnormalities                           D50.9, Iron deficiency anemia, unspecified                           K57.30, Diverticulosis of large intestine without                            perforation or abscess without bleeding CPT copyright 2016 American Medical Association. All rights reserved. The codes documented in this report are preliminary and upon coder review may  be revised to meet current compliance requirements. Cristopher Estimable. [place] Brogden, MD Norvel Richards, MD 05/05/2016 9:51:13 AM This report has been signed electronically. Number of Addenda: 0

## 2016-05-05 NOTE — Interval H&P Note (Signed)
History and Physical Interval Note:  05/05/2016 9:00 AM  [name]  has presented today for surgery, with the diagnosis of gi bleed  The various methods of treatment have been discussed with the patient and family. After consideration of risks, benefits and other options for treatment, the patient has consented to  Procedure(s): COLONOSCOPY (N/A) as a surgical intervention .  The patient's history has been reviewed, patient examined, no change in status, stable for surgery.  I have reviewed the patient's chart and labs.  Questions were answered to the patient's satisfaction.    Stable overnight. Hemoglobin 8.0 this morning. No bleeding with colon prep. Colonoscopy now being done per plan as discussed with patient and family late this morning and last evening. Unless significant findings today, will complete GI evaluation with a capsule study of the small bowel as outlined to patient and family.The risks, benefits, limitations, alternatives and imponderables have been reviewed with the patient. Questions have been answered. All parties are agreeable.  Manus Rudd

## 2016-05-05 NOTE — H&P (View-Only) (Signed)
Referring Provider: No ref. provider found Primary Care Physician:  Mickie Hillier, MD Primary Gastroenterologist:  Dr.Ziad Maye  Reason for Consultation:  Melena; Hemoccult positive  HPI: Pleasant 76 year old lady with recently diagnosed Stage IIIa vulvar cancer admitted to the hospital with anemia and melena / Hemoccult-positive stool. Patient states she developed increasing fatigue over the past 1 week and has developed intermittent black tarry stools. No iron or Pepto-Bismol therapy.  Has not had any hematemesis or hematochezia.    Hemoglobin 6.5 in the ED. Hemoccult-positive:  8.0 after a 1 unit transfusion.  She remains hemodynamically stable.  Furthermore, patient denies reflux symptoms, odynophagia, dysphagia, early satiety nausea, vomiting, abdominal pain or other change in bowel function. No history of NSAID use aside from one baby aspirin daily.  No history of peptic ulcer disease or prior upper GI tract evaluation.  I saw this nice lady back in 2010 for diarrhea. She underwent a colonoscopy which revealed lymphocytic colitis, diverticulosis and a small adenoma which was removed.  Recent oncology evaluation for newly diagnosed vulvar cancer included a CT abdomen and pelvis which demonstrated no obvious metastatic disease.   Small nonspecific pulmonary nodules were seen.  Past Medical History:  Diagnosis Date  . Aortic stenosis, mild   . Aortic valve stenosis, mild   . Arthritis   . Cancer ([place])    vulvar   . Complication of anesthesia   . Diabetes mellitus without complication ([place])    type 2  . Heart murmur   . History of blood transfusion age 17 or 29 months old  . Hypertension   . IFG (impaired fasting glucose)   . PONV (postoperative nausea and vomiting)    years ago with back surgery-nausea  . Vitamin D deficiency     Past Surgical History:  Procedure Laterality Date  . ABDOMINAL HYSTERECTOMY     complete  . BACK SURGERY     upper  with plate frontal fusion  and lower x 2  . COLONOSCOPY    . KNEE SURGERY Left    arthroscopy  . RADICAL VULVECTOMY Left 04/09/2016   Procedure: RADICAL LEFT VULVECTOMY;  Surgeon: Everitt Amber, MD;  Location: [facility];  Service: Gynecology;  Laterality: Left;  . TOE SURGERY Left   . TONSILLECTOMY    . TOTAL HIP ARTHROPLASTY Left 10/27/2013   Procedure: LEFT TOTAL HIP ARTHROPLASTY;  Surgeon: Tobi Bastos, MD;  Location: [facility];  Service: Orthopedics;  Laterality: Left;  . TOTAL KNEE ARTHROPLASTY Right   . VULVA /PERINEUM BIOPSY  02/22/2016  . VULVECTOMY Right 02/27/2016   Procedure: RADICAL RIGHT VULVECTOMY WITH RIGHT INGUINAL LYMPH NODE DISSECTION;  Surgeon: Nancy Marus, MD;  Location: [facility];  Service: Gynecology;  Laterality: Right;    Prior to Admission medications   Medication Sig Start Date End Date Taking? Authorizing Provider  aspirin 81 MG tablet Take 81 mg by mouth daily.   Yes Historical Provider, MD  Cholecalciferol (VITAMIN D3 PO) Take 1,000 Units by mouth 2 (two) times daily.   Yes Historical Provider, MD  CINNAMON PO Take 2 capsules by mouth daily. 2,000 mg daily    Yes Historical Provider, MD  furosemide (LASIX) 40 MG tablet TAKE ONE TABLET BY MOUTH ONCE DAILY. 12/15/15  Yes Mikey Kirschner, MD  gabapentin (NEURONTIN) 100 MG capsule TAKE 1 CAPSULE BY MOUTH THREE TIMES A DAY. 03/14/16  Yes Mikey Kirschner, MD  HYDROcodone-acetaminophen (NORCO) 5-325 MG tablet Take 1 tablet by mouth every 6 (six) hours as  needed for moderate pain. 04/09/16  Yes Lahoma Crocker, MD  losartan (COZAAR) 50 MG tablet TAKE ONE TABLET BY MOUTH ONCE DAILY. 12/15/15  Yes Mikey Kirschner, MD  metFORMIN (GLUCOPHAGE) 500 MG tablet TAKE (1) TABLET BY MOUTH TWICE DAILY. 03/14/16  Yes Mikey Kirschner, MD  Multiple Minerals-Vitamins (CALCIUM-MAGNESIUM-ZINC-D3 PO) Take 2 tablets by mouth 2 (two) times daily.   Yes Historical Provider, MD  potassium chloride SA (K-DUR,KLOR-CON) 20 MEQ tablet Take 1 tablet (20 mEq total) by mouth daily.  11/27/15  Yes Mikey Kirschner, MD    Current Facility-Administered Medications  Medication Dose Route Frequency Provider Last Rate Last Dose  . acetaminophen (TYLENOL) tablet 650 mg  650 mg Oral Q6H PRN Truett Mainland, DO       Or  . acetaminophen (TYLENOL) suppository 650 mg  650 mg Rectal Q6H PRN Tanna Savoy Stinson, DO      . gabapentin (NEURONTIN) capsule 100 mg  100 mg Oral TID Tanna Savoy Stinson, DO   100 mg at 05/04/16 1547  . HYDROcodone-acetaminophen (NORCO/VICODIN) 5-325 MG per tablet 1 tablet  1 tablet Oral Q6H PRN Tanna Savoy Stinson, DO      . insulin aspart (novoLOG) injection 0-15 Units  0-15 Units Subcutaneous TID WC Tanna Savoy Stinson, DO      . lactated ringers infusion   Intravenous Continuous Samuella Cota, MD 50 mL/hr at 05/04/16 [phone]    . losartan (COZAAR) tablet 50 mg  50 mg Oral Daily Tanna Savoy Stinson, DO   50 mg at 05/04/16 4403  . ondansetron (ZOFRAN) tablet 4 mg  4 mg Oral Q6H PRN Tanna Savoy Stinson, DO       Or  . ondansetron [facility]) injection 4 mg  4 mg Intravenous Q6H PRN Tanna Savoy Stinson, DO      . pantoprazole (PROTONIX) injection 40 mg  40 mg Intravenous Q12H Tanna Savoy Stinson, DO   40 mg at 05/04/16 0934  . potassium chloride SA (K-DUR,KLOR-CON) CR tablet 20 mEq  20 mEq Oral Daily Tanna Savoy Stinson, DO   20 mEq at 05/04/16 4742    Allergies as of 05/03/2016 - Review Complete 05/03/2016  Allergen Reaction Noted  . Celebrex [celecoxib]  10/26/2014  . Doxycycline Nausea Only 05/21/2012  . Lodine [etodolac] Nausea Only 05/21/2012  . Nabumetone    . Tetracycline    . Xarelto [rivaroxaban] Hives 10/26/2014    Family History  Problem Relation Age of Onset  . Hypertension Mother   . Hypertension Father   . Throat cancer Father   . Cancer Sister     Breast  . Hypertension Brother     Social History   Social History  . Marital status: Married    Spouse name: N/A  . Number of children: 2  . Years of education: N/A   Occupational History  . Not on file.    Social History Main Topics  . Smoking status: Never Smoker  . Smokeless tobacco: Never Used  . Alcohol use No  . Drug use: No  . Sexual activity: Not on file   Other Topics Concern  . Not on file   Social History Narrative  . No narrative on file    Review of Systems:  As in history of present illness  Physical Exam: Vital signs in last 24 hours: Temp:  [97.9 F (36.6 C)-99.2 F (37.3 C)] 98.7 F (37.1 C) (04/14 1300) Pulse Rate:  [74-93] 74 (04/14 1300) Resp:  [18-20] 18 (04/14  1300) BP: (106-138)/(57-74) 106/57 (04/14 1300) SpO2:  [97 %-100 %] 99 % (04/14 1300) Weight:  [195 lb (88.5 kg)] 195 lb (88.5 kg) (04/13 1708) Last BM Date: 05/04/16 General:  Pale, alert and conversant. pleasant and cooperative in NAD Mouth:  No deformity or lesions, dentition normal. Neck:  Supple; no masses or thyromegaly. Lungs:  Clear throughout to auscultation.   No wheezes, crackles, or rhonchi. No acute distress. Heart:  Regular rate and rhythm; 3- 4/6 systolic ejection murmur. Abdomen:  Nondistended. Normal bowel sounds, without guarding, and without rebound.    Intake/Output from previous day: 04/13 0701 - 04/14 0700 In: 910 [Blood:410; IV Piggyback:500] Out: -  Intake/Output this shift: Total I/O In: 240 [P.O.:240] Out: -   Lab Results:  Recent Labs  05/03/16 1822 05/04/16 0552 05/04/16 1146  WBC 10.0 6.0 6.8  HGB 6.5* 7.7* 8.0*  HCT 21.4* 24.1* 25.2*  PLT 243 209 228   BMET  Recent Labs  05/03/16 1131 05/04/16 0552  NA 138 140  K 3.4* 3.3*  CL 103 108  CO2 25 26  GLUCOSE 126* 109*  BUN 27* 18  CREATININE 0.87 0.71  CALCIUM 9.4 8.7*   LFT  Recent Labs  05/03/16 1131  PROT 7.4  ALBUMIN 4.0  AST 24  ALT 20  ALKPHOS 63  BILITOT 0.4     Impression:  Pleasant 76 year old lady recently diagnosed with stage IIIa vulvar cancer admitted to the hospital with a history of melena and significant anemia. Otherwise, patient is devoid of any GI tract  symptoms.  She may well have an upper GI tract source for bleeding. BUN notably elevated. Small bowel or right colon etiology not absolutely excluded. Any number of potential causes including occult metastatic disease would remain in the differential at this time.  History of colonic adenoma removed some 8 years ago. Known diverticulosis.  Agree with PPI therapy empirically.    Recommendations:  I have offered the patient a diagnostic EGD today.  The risks, benefits, limitations, alternatives and imponderables have been reviewed with the patient. Potential for esophageal dilation, biopsy, etc. have also been reviewed.  Questions have been answered. All parties agreeable.  Further recommendations to follow.        Notice:  This dictation was prepared with Dragon dictation along with smaller phrase technology. Any transcriptional errors that result from this process are unintentional and may not be corrected upon review.

## 2016-05-06 ENCOUNTER — Ambulatory Visit
Admission: RE | Admit: 2016-05-06 | Discharge: 2016-05-06 | Disposition: A | Discharge status: Home / Self Care | Payer: PPO | Source: Ambulatory Visit | Attending: Radiation Oncology | Admitting: Radiation Oncology

## 2016-05-06 ENCOUNTER — Ambulatory Visit: Payer: PPO

## 2016-05-06 ENCOUNTER — Ambulatory Visit: Payer: PPO | Admitting: Radiation Oncology

## 2016-05-06 ENCOUNTER — Encounter (HOSPITAL_COMMUNITY)
Admission: EM | Disposition: A | Discharge status: Home / Self Care | Payer: Self-pay | Source: Home / Self Care | Attending: Family Medicine

## 2016-05-06 HISTORY — PX: GIVENS CAPSULE STUDY: SHX5432

## 2016-05-06 LAB — GLUCOSE, CAPILLARY
GLUCOSE-CAPILLARY: 95 mg/dL (ref 65–99)
GLUCOSE-CAPILLARY: 99 mg/dL (ref 65–99)
GLUCOSE-CAPILLARY: 92 mg/dL (ref 65–99)

## 2016-05-06 SURGERY — IMAGING PROCEDURE, GI TRACT, INTRALUMINAL, VIA CAPSULE

## 2016-05-06 NOTE — Progress Notes (Signed)
    Subjective: No abdominal pain. No overt GI bleeding. Capsule study in process. Patient notified she may have clear, colorless liquid at 0939.   Objective: Vital signs in last 24 hours: Temp:  [97.7 F (36.5 C)-98.3 F (36.8 C)] 98.3 F (36.8 C) (04/16 0412) Pulse Rate:  [70-81] 79 (04/16 0412) Resp:  [16] 16 (04/16 0412) BP: (96-124)/(49-67) 96/61 (04/16 0412) SpO2:  [96 %-100 %] 97 % (04/16 0412) Last BM Date: 05/04/16 General:   Alert and oriented, pleasant Head:  Normocephalic and atraumatic. Abdomen:  Bowel sounds present, soft, non-tender, non-distended. Limited due to capsule study equipment Extremities:  Without  edema. Neurologic:  Alert and  oriented x4 Psych:  Alert and cooperative. Normal mood and affect.  Intake/Output from previous day: 04/15 0701 - 04/16 0700 In: 480 [P.O.:480] Out: 450 [Urine:450] Intake/Output this shift: No intake/output data recorded.  Lab Results:  Recent Labs  05/04/16 0552 05/04/16 1146 05/05/16 0541  WBC 6.0 6.8 6.6  HGB 7.7* 8.0* 8.0*  HCT 24.1* 25.2* 26.1*  PLT 209 228 230   BMET  Recent Labs  05/03/16 1131 05/04/16 0552  NA 138 140  K 3.4* 3.3*  CL 103 108  CO2 25 26  GLUCOSE 126* 109*  BUN 27* 18  CREATININE 0.87 0.71  CALCIUM 9.4 8.7*   LFT  Recent Labs  05/03/16 1131  PROT 7.4  ALBUMIN 4.0  AST 24  ALT 20  ALKPHOS 63  BILITOT 0.4    Assessment: 76 year old female with anemia, melena, heme positive stool, and EGD on 4/14 normal and colonoscopy 4/15 with only diverticulosis. Capsule study started today to further evaluate obscure GI bleeding. Remains without overt GI bleeding. Hgb remains stable from a few days ago. Capsule to be available tomorrow for interpretation.     Plan: Clear, colorless liquids at 0939 May advance diet once capsule study complete Interpretation of images on 4/17   Annitta Needs, ANP-BC [facility]    LOS: 2 days    05/06/2016, 7:41 AM

## 2016-05-06 NOTE — Progress Notes (Signed)
PROGRESS NOTE  [name] FGH:[id number] DOB: 01-Dec-1940 DOA: 05/03/2016 PCP: Mickie Hillier, MD  Brief Narrative: 46yow PMH DM type 2, vulvar cancer with plans for radiation soon, presented to ED with SOB for several days, black stools. Admitted for symptomatic anemia, suspected UGIB  Assessment/Plan 1. Symptomatic microcytic anemia with h/o dark stools, presumed GIB but EGD and colonoscopy this admission both unremarkable. No evidence of ongoing bleeding. Hgb remains stable. No NSAIDs other than daily ASA. S/p 1 U PRBC with appropriate rise in Hgb.  Stable.   2. Anemia of chronic disease.   3. DM type 2, CBG stable.  Remains stable.   4. Vulvar cancer. s/p radial vulvectomy 03/2016. Radiation therapy planned soon.  5. Coronary artery atherosclerosis.   6. Known aortic stenosis, mild, established problem, stable.  Home when cleared by GI.  DVT prophylaxis: SCDs Code Status: full Family Communication: none Disposition Plan: home    Murray Hodgkins, MD  [facility] Direct contact: [phone] --[place] [place]  --[email or web address]; password [email or web address]  7PM-7AM contact night coverage as above 05/06/2016, 1:09 PM  LOS: 2 days   Consultants:  GI  Procedures:  EGD 4/14 Impression:               - Normal esophagus.                           - Small hiatal hernia.                           - The examination was otherwise normal.                           - Normal duodenal bulb, first portion of the                            duodenum, second portion of the duodenum, third                            portion of the duodenum and fourth portion of the                            duodenum.                           - No specimens collected. No explanation for GI                            bleeding based on today's procedural findings.   Colonoscopy 4/15 Impression:               - Diverticulosis in the sigmoid colon and in the                            descending  colon.                           - The examination was otherwise normal on direct                            and retroflexion  views.                           - No specimens collected. Negative EGD Overt GI                            bleeding of obscure etiology. Evaluation of small                            intestine now needed  Antimicrobials:   Interval history/Subjective: Feeling fine, no complaints, no pain.  Objective: Vitals:   05/05/16 1016 05/05/16 1300 05/05/16 2159 05/06/16 0412  BP: (!) 111/49 (!) 100/52 (!) 105/52 96/61  Pulse: 78 70 81 79  Resp: 16 16 16 16   Temp: 97.7 F (36.5 C) 98 F (36.7 C) 98.3 F (36.8 C) 98.3 F (36.8 C)  TempSrc: Oral Oral Oral Oral  SpO2: 98% 100% 96% 97%  Weight:      Height:        Intake/Output Summary (Last 24 hours) at 05/06/16 1309 Last data filed at 05/06/16 0930  Gross per 24 hour  Intake              600 ml  Output              650 ml  Net              -50 ml     Filed Weights   05/03/16 1127 05/03/16 1708  Weight: 88.5 kg (195 lb) 88.5 kg (195 lb)    Exam: General: appears calm and comfortable CV: RRR no m/r/g. No LE edema. Resp: CTA bilaterally, normal resp effort Psych: alert, speech fluent and clear.  I have personally reviewed the following:   Labs:  CBG stable  Imaging studies:    Medical tests:  .  Scheduled Meds: . gabapentin  100 mg Oral TID  . insulin aspart  0-15 Units Subcutaneous TID WC  . losartan  50 mg Oral Daily  . pantoprazole (PROTONIX) IV  40 mg Intravenous Q12H  . potassium chloride SA  20 mEq Oral Daily   Continuous Infusions:   Principal Problem:   Symptomatic anemia Active Problems:   Essential hypertension, benign   Aortic valve stenosis, mild   GI bleed   Diabetes mellitus without complication ([facility])   GIB (gastrointestinal bleeding)   LOS: 2 days

## 2016-05-06 NOTE — Care Management Note (Signed)
Case Management Note  Patient Details  Name: [name] MRN: [id number] Date of Birth: 03-Jul-1940  Subjective/Objective: Adm with symptomatic anemia. From home, ind with ADL's. No DME or HH PTA. Has PCP, still drives to appointments, reports no issue affording medications.                     Action/Plan: Anticipate DC home with self care. Possible DC later today after capsule study complete.    Expected Discharge Date:   05/06/2016               Expected Discharge Plan:  Home/Self Care  In-House Referral:     Discharge planning Services  CM Consult  Post Acute Care Choice:    Choice offered to:  NA  DME Arranged:    DME Agency:     HH Arranged:    HH Agency:     Status of Service:  Completed, signed off  If discussed at [employer] of Stay Meetings, dates discussed:    Additional Comments:  Anselm Aumiller, Chauncey Reading, RN 05/06/2016, 10:57 AM

## 2016-05-07 ENCOUNTER — Encounter (HOSPITAL_COMMUNITY): Payer: Self-pay | Admitting: Internal Medicine

## 2016-05-07 ENCOUNTER — Telehealth: Payer: Self-pay | Admitting: Gastroenterology

## 2016-05-07 ENCOUNTER — Ambulatory Visit: Payer: PPO

## 2016-05-07 DIAGNOSIS — E119 Type 2 diabetes mellitus without complications: Secondary | ICD-10-CM

## 2016-05-07 DIAGNOSIS — D62 Acute posthemorrhagic anemia: Secondary | ICD-10-CM

## 2016-05-07 DIAGNOSIS — D638 Anemia in other chronic diseases classified elsewhere: Secondary | ICD-10-CM

## 2016-05-07 LAB — TYPE AND SCREEN
ABO/RH(D): B POS
Antibody Screen: NEGATIVE
UNIT DIVISION: 0
Unit division: 0

## 2016-05-07 LAB — BPAM RBC
BLOOD PRODUCT EXPIRATION DATE: 201805072359
Blood Product Expiration Date: 201805072359
ISSUE DATE / TIME: 201804131951
UNIT TYPE AND RH: 5100
Unit Type and Rh: 5100

## 2016-05-07 LAB — GLUCOSE, CAPILLARY
GLUCOSE-CAPILLARY: 103 mg/dL — AB (ref 65–99)
GLUCOSE-CAPILLARY: 104 mg/dL — AB (ref 65–99)
GLUCOSE-CAPILLARY: 105 mg/dL — AB (ref 65–99)

## 2016-05-07 NOTE — Telephone Encounter (Signed)
Patient missed her appt with radiation oncology (Dr. Denman George) on the 16th as she was inpatient. Is there any way that we can have her plugged back in with them ASAP? She is now at home.   We need to see her back in about 6-8 weeks.

## 2016-05-07 NOTE — Telephone Encounter (Signed)
According to Epic appt desk. Pt is scheduled with rad onc 05/15/16.

## 2016-05-07 NOTE — Procedures (Addendum)
Small Bowel Giv[place] Capsule Study Procedure date:  05/06/16  Referring Provider: Dr. Gala Romney  PCP:  Dr. Mickie Hillier, MD  Indication for procedure:  76 year old female with anemia, melena, and heme positive stool, with normal EGD and colonoscopy only with diverticulosis. Capsule study indicated to conclude GI evaluation. Hgb 6.5 on admission.    Findings:   Capsule study was complete to the cecum. No obvious mass, lesions, AVMs. Scattered debris/bile more distal small intestine, limiting quality of images. Possible old blood towards distal small bowel around 02:49:55 but significant debris makes difficult to identify. No obvious culprit for melena noted on capsule study.   First Gastric image:  00:04:42 First Duodenal image: 00:22:31 First Cecal image: 04:09:45 Gastric Passage time: 0h 46m Small Bowel Passage time:  3h 13m  Summary & Recommendations: 77 year old female with vulvar cancer, admitted with symptomatic anemia, melena, and heme positive stool. Colonoscopy/EGD unrevealing. Capsule study without obvious mass, lesion, AVMs. Debris precluded ideal examination of images. However, still no overt evidence of active bleeding noted. From review of chart, her Hgb has ranged from 7-9. 1 unit of blood received this admission and now steady at 8.   Recommend monitoring of Hgb as outpatient, close follow-up with Hematology/Oncology. Follow-up with our practice as outpatient. Hopeful discharge home soon, as she has had no further overt GI bleeding and Hgb remaining stable.  Annitta Needs, ANP-BC [facility]   Attending note:  Pertinent images are reviewed. Agree with above assessment and recommendations.

## 2016-05-07 NOTE — Discharge Summary (Signed)
Physician Discharge Summary  [name] VPX:[id number] DOB: [birth year] DOA: 05/03/2016  PCP: Mickie Hillier, MD  Admit date: 05/03/2016 Discharge date: 05/07/2016  Recommendations for Outpatient Follow-up:  1. f/u with GI for GIB unclear etiology.  Follow-up Information    Neil Crouch, PA-C Follow up on 05/30/2016.   Specialty:  Gastroenterology Why:  at 2:00 pm Contact information: [address] [place] [address] [phone]            Discharge Diagnoses:  1. Symptomatic microcytic anemia 2. Presumed GIB 3. Anemia of chronic disease 4. DM type 2 5. Vulvar cancer  Discharge Condition: improved Disposition: home  Diet recommendation: diabetic diet  Filed Weights   05/03/16 1127 05/03/16 1708  Weight: 88.5 kg (195 lb) 88.5 kg (195 lb)    History of present illness:  76yow PMH DM type 2, vulvar cancer with plans for radiation soon, presented to ED with SOB for several days, black stools. Admitted for symptomatic anemia, suspected UGIB.  Hospital Course:  No definite bleeding since admission. Transfused 1 unit PRBC with appropriate rise. Seen by GI and underwent EGD, colonoscopy, and small bowel video capsule study; all unrevealing. Hgb remained stable and hospitalization was uncomplicated. Patient will f/u with GI as an outpatient.  Consultants:  GI  Procedures:  EGD 4/14 Impression: - Normal esophagus. - Small hiatal hernia. - The examination was otherwise normal. - Normal duodenal bulb, first portion of the  duodenum, second portion of the duodenum, third  portion of the duodenum and fourth portion of the  duodenum. - No specimens collected. No explanation for GI  bleeding based on today's  procedural findings.   Colonoscopy 4/15 Impression: - Diverticulosis in the sigmoid colon and in the  descending colon. - The examination was otherwise normal on direct  and retroflexion views. - No specimens collected. Negative EGD Overt GI  bleeding of obscure etiology. Evaluation of small  intestine now needed   Small Bowel Givens Capsule Study Capsule study without obvious mass, lesion, AVMs. Debris precluded ideal examination of images. However, still no overt evidence of active bleeding noted.   Today's assessment: S: feels fine O: Vitals:   05/06/16 2101 05/07/16 0514  BP: 122/62 120/64  Pulse: 78 76  Resp: 18 18  Temp: 98 F (36.7 C) 98 F (36.7 C)   General: appears calm and comfortable CV: RRR no m/r/g. No LE edema Resp: CTA bilaterally, no w/r/r. Normal respiratory effort.   Discharge Instructions  Discharge Instructions    Activity as tolerated - No restrictions    Complete by:  As directed    Diet - low sodium heart healthy    Complete by:  As directed    Diet Carb Modified    Complete by:  As directed    Discharge instructions    Complete by:  As directed    Call your physician or seek immediate medical attention for bleeding, fatigue, weakness, passing out, dark stools, vomiting, shortness of breath or worsening of condition.    can restart ASA in 3 days Allergies as of 05/07/2016      Reactions   Celebrex [celecoxib]    GI upset   Doxycycline Nausea Only   Gi upset   Lodine [etodolac] Nausea Only   Gi upset   Nabumetone    (relafen)   Tetracycline    Xarelto [rivaroxaban] Hives      Medication List    TAKE these medications   aspirin 81  MG tablet Take 81 mg by mouth daily.   CALCIUM-MAGNESIUM-ZINC-D3 PO Take 2 tablets by mouth 2 (two) times daily.   CINNAMON  PO Take 2 capsules by mouth daily. 2,000 mg daily   furosemide 40 MG tablet Commonly known as:  LASIX TAKE ONE TABLET BY MOUTH ONCE DAILY.   gabapentin 100 MG capsule Commonly known as:  NEURONTIN TAKE 1 CAPSULE BY MOUTH THREE TIMES A DAY.   HYDROcodone-acetaminophen 5-325 MG tablet Commonly known as:  NORCO Take 1 tablet by mouth every 6 (six) hours as needed for moderate pain.   losartan 50 MG tablet Commonly known as:  COZAAR TAKE ONE TABLET BY MOUTH ONCE DAILY.   metFORMIN 500 MG tablet Commonly known as:  GLUCOPHAGE TAKE (1) TABLET BY MOUTH TWICE DAILY.   potassium chloride SA 20 MEQ tablet Commonly known as:  K-DUR,KLOR-CON Take 1 tablet (20 mEq total) by mouth daily.   VITAMIN D3 PO Take 1,000 Units by mouth 2 (two) times daily.      Allergies  Allergen Reactions  . Celebrex [Celecoxib]     GI upset   . Doxycycline Nausea Only    Gi upset  . Lodine [Etodolac] Nausea Only    Gi upset  . Nabumetone     (relafen)  . Tetracycline   . Xarelto [Rivaroxaban] Hives    The results of significant diagnostics from this hospitalization (including imaging, microbiology, ancillary and laboratory) are listed below for reference.    Significant Diagnostic Studies: Dg Chest 2 View  Result Date: 05/03/2016 CLINICAL DATA:  Shortness of breath for 2-3 days EXAM: CHEST  2 VIEW COMPARISON:  Chest CT 04/01/2016 FINDINGS: The previously seen small bilateral pulmonary nodules cannot be appreciated by plain film. Heart is normal size. Lungs are clear. No effusions or acute bony abnormality. IMPRESSION: No active cardiopulmonary disease. Unable to visualize the previously seen small bilateral pulmonary nodules. These should be followed with CT. Electronically Signed   By: Rolm Baptise M.D.   On: 05/03/2016 11:54   Ct Angio Chest Pe W/cm &/or Wo Cm  Result Date: 05/03/2016 CLINICAL DATA:  76 year old female with shortness of breath for 2 days. Recently diagnosed and treated for  ball vulva cancer in January and February. EXAM: CT ANGIOGRAPHY CHEST WITH CONTRAST TECHNIQUE: Multidetector CT imaging of the chest was performed using the standard protocol during bolus administration of intravenous contrast. Multiplanar CT image reconstructions and MIPs were obtained to evaluate the vascular anatomy. CONTRAST:  100 mL Isovue 370 COMPARISON:  Chest CT 04/01/2016. FINDINGS: Cardiovascular: Good contrast bolus timing in the pulmonary arterial tree. No focal filling defect identified in the pulmonary arteries to suggest acute pulmonary embolism. Cardiac size is at the upper limits of normal to mildly enlarged. No pericardial effusion. Negative visible aorta. There is evidence of left coronary artery atherosclerosis. Mediastinum/Nodes: Stable hypodense right thyroid nodule. No mediastinal or hilar lymphadenopathy. Lungs/Pleura: Lower lung volumes. Major airways are patent. Perihilar and dependent atelectasis. Stable small superior segment right lower lobe subpleural nodule measuring 8-9 mm on series 7, image 41 small calcified granuloma along the left superior major fissure incidentally re - identified. Stable small lateral left lower lobe subpleural nodule on series 7, image 47. No pleural effusion.  No confluent or suspicious pulmonary opacity. Upper Abdomen: Negative visualized liver, spleen, pancreas, adrenal glands, kidneys, and bowel in the upper abdomen. Musculoskeletal: Disc and endplate degeneration in the thoracic spine. No acute or suspicious osseous lesion. Review of the MIP images confirms the above findings.  IMPRESSION: 1.  No evidence of acute pulmonary embolus. 2. Calcified coronary artery atherosclerosis. Negative visible aorta. 3. Pulmonary atelectasis with no other acute pulmonary process. The small subpleural pulmonary nodules seen by CT last month are stable. Electronically Signed   By: Genevie Ann M.D.   On: 05/03/2016 14:36    Microbiology: Recent Results (from the past 240  hour(s))  Urine culture     Status: Abnormal   Collection Time: 05/03/16  1:16 PM  Result Value Ref Range Status   Specimen Description URINE, CLEAN CATCH  Final   Special Requests NONE  Final   Culture MULTIPLE SPECIES PRESENT, SUGGEST RECOLLECTION (A)  Final   Report Status 05/05/2016 FINAL  Final     Labs: Basic Metabolic Panel:  Recent Labs Lab 05/03/16 1131 05/04/16 0552  NA 138 140  K 3.4* 3.3*  CL 103 108  CO2 25 26  GLUCOSE 126* 109*  BUN 27* 18  CREATININE 0.87 0.71  CALCIUM 9.4 8.7*   Liver Function Tests:  Recent Labs Lab 05/03/16 1131  AST 24  ALT 20  ALKPHOS 63  BILITOT 0.4  PROT 7.4  ALBUMIN 4.0   CBC:  Recent Labs Lab 05/03/16 1131 05/03/16 1822 05/04/16 0552 05/04/16 1146 05/05/16 0541  WBC 11.9* 10.0 6.0 6.8 6.6  NEUTROABS 9.1*  --   --   --   --   HGB 7.5* 6.5* 7.7* 8.0* 8.0*  HCT 24.6* 21.4* 24.1* 25.2* 26.1*  MCV 73.7* 73.3* 75.5* 75.7* 76.8*  PLT 321 243 209 228 230   Cardiac Enzymes:  Recent Labs Lab 05/03/16 1131  TROPONINI <0.03    CBG:  Recent Labs Lab 05/06/16 1117 05/06/16 1702 05/06/16 2057 05/07/16 0719 05/07/16 1153  GLUCAP 99 92 103* 104* 105*    Principal Problem:   Symptomatic anemia Active Problems:   Essential hypertension, benign   Aortic valve stenosis, mild   GI bleed   Diabetes mellitus without complication ([place])   GIB (gastrointestinal bleeding)   Time coordinating discharge: 25 minutes  Signed:  Murray Hodgkins, MD [facility] 05/07/2016, 5:48 PM

## 2016-05-07 NOTE — Progress Notes (Signed)
Patient discharged in stable condition with family by POV. Discharge instructions reviewed with the patient and patient verbalizes understanding and had no questions.

## 2016-05-08 ENCOUNTER — Ambulatory Visit (INDEPENDENT_AMBULATORY_CARE_PROVIDER_SITE_OTHER): Payer: PPO | Admitting: Family Medicine

## 2016-05-08 ENCOUNTER — Ambulatory Visit: Payer: PPO

## 2016-05-08 ENCOUNTER — Ambulatory Visit: Payer: PPO | Admitting: Radiation Oncology

## 2016-05-08 ENCOUNTER — Encounter: Payer: Self-pay | Admitting: Family Medicine

## 2016-05-08 VITALS — BP 128/82 | Temp 97.6°F | Ht 64.0 in | Wt 192.4 lb

## 2016-05-08 DIAGNOSIS — D649 Anemia, unspecified: Secondary | ICD-10-CM | POA: Diagnosis not present

## 2016-05-08 DIAGNOSIS — K922 Gastrointestinal hemorrhage, unspecified: Secondary | ICD-10-CM | POA: Diagnosis not present

## 2016-05-08 MED ORDER — HYDROCODONE-ACETAMINOPHEN 5-325 MG PO TABS: 1.0000 | ORAL_TABLET | Freq: Four times a day (QID) | ORAL | 0 refills | Status: DC | PRN

## 2016-05-08 NOTE — Progress Notes (Signed)
Subjective:    Patient ID: [name], female    DOB: Dec 31, 1940, 76 y.o.   MRN: [id number] Patient actually presents for follow-up from serious hospitalization with ongoing substantial concerns. Patient seen within 2 business days of discharge so therefore communication provided, and fact along with examination.  Entire hospital record and all lab results and all procedure results and all imaging reviewed today in presence of the patient. Diabetes  She presents for her follow-up diabetic visit. Associated symptoms include weakness. Symptoms are improving. When asked about meal planning, she reported none. She participates in exercise three times a week. There is no change in her home blood glucose trend. She sees a podiatrist.Eye exam is not current (PT SCHEDULE TO FOR EYE EXAM).    Results for orders placed or performed during the hospital encounter of 05/03/16  Urine culture  Result Value Ref Range   Specimen Description URINE, CLEAN CATCH    Special Requests NONE    Culture MULTIPLE SPECIES PRESENT, SUGGEST RECOLLECTION (A)    Report Status 05/05/2016 FINAL   CBC with Differential  Result Value Ref Range   WBC 11.9 (H) 4.0 - 10.5 K/uL   RBC 3.34 (L) 3.87 - 5.11 MIL/uL   Hemoglobin 7.5 (L) 12.0 - 15.0 g/dL   HCT 24.6 (L) 36.0 - 46.0 %   MCV 73.7 (L) 78.0 - 100.0 fL   MCH 22.5 (L) 26.0 - 34.0 pg   MCHC 30.5 30.0 - 36.0 g/dL   RDW 15.2 11.5 - 15.5 %   Platelets 321 150 - 400 K/uL   Neutrophils Relative % 76 %   Neutro Abs 9.1 (H) 1.7 - 7.7 K/uL   Lymphocytes Relative 14 %   Lymphs Abs 1.7 0.7 - 4.0 K/uL   Monocytes Relative 9 %   Monocytes Absolute 1.1 (H) 0.1 - 1.0 K/uL   Eosinophils Relative 0 %   Eosinophils Absolute 0.0 0.0 - 0.7 K/uL   Basophils Relative 0 %   Basophils Absolute 0.1 0.0 - 0.1 K/uL  Troponin I  Result Value Ref Range   Troponin I <0.03 <0.03 ng/mL  Comprehensive metabolic panel  Result Value Ref Range   Sodium 138 135 - 145 mmol/L   Potassium  3.4 (L) 3.5 - 5.1 mmol/L   Chloride 103 101 - 111 mmol/L   CO2 25 22 - 32 mmol/L   Glucose, Bld 126 (H) 65 - 99 mg/dL   BUN 27 (H) 6 - 20 mg/dL   Creatinine, Ser 0.87 0.44 - 1.00 mg/dL   Calcium 9.4 8.9 - 10.3 mg/dL   Total Protein 7.4 6.5 - 8.1 g/dL   Albumin 4.0 3.5 - 5.0 g/dL   AST 24 15 - 41 U/L   ALT 20 14 - 54 U/L   Alkaline Phosphatase 63 38 - 126 U/L   Total Bilirubin 0.4 0.3 - 1.2 mg/dL   GFR calc non Af Amer >60 >60 mL/min   GFR calc Af Amer >60 >60 mL/min   Anion gap 10 5 - 15  Urinalysis, Routine w reflex microscopic  Result Value Ref Range   Color, Urine YELLOW YELLOW   APPearance HAZY (A) CLEAR   Specific Gravity, Urine 1.025 1.005 - 1.030   pH 5.0 5.0 - 8.0   Glucose, UA NEGATIVE NEGATIVE mg/dL   Hgb urine dipstick NEGATIVE NEGATIVE   Bilirubin Urine NEGATIVE NEGATIVE   Ketones, ur NEGATIVE NEGATIVE mg/dL   Protein, ur 30 (A) NEGATIVE mg/dL   Nitrite NEGATIVE NEGATIVE  Leukocytes, UA LARGE (A) NEGATIVE   RBC / HPF 6-30 0 - 5 RBC/hpf   WBC, UA TOO NUMEROUS TO COUNT 0 - 5 WBC/hpf   Bacteria, UA RARE (A) NONE SEEN   Squamous Epithelial / LPF 0-5 (A) NONE SEEN   Mucous PRESENT    Hyaline Casts, UA PRESENT    Non Squamous Epithelial 0-5 (A) NONE SEEN  CBC  Result Value Ref Range   WBC 10.0 4.0 - 10.5 K/uL   RBC 2.92 (L) 3.87 - 5.11 MIL/uL   Hemoglobin 6.5 (LL) 12.0 - 15.0 g/dL   HCT 21.4 (L) 36.0 - 46.0 %   MCV 73.3 (L) 78.0 - 100.0 fL   MCH 22.3 (L) 26.0 - 34.0 pg   MCHC 30.4 30.0 - 36.0 g/dL   RDW 15.1 11.5 - 15.5 %   Platelets 243 150 - 400 K/uL  CBC  Result Value Ref Range   WBC 6.0 4.0 - 10.5 K/uL   RBC 3.19 (L) 3.87 - 5.11 MIL/uL   Hemoglobin 7.7 (L) 12.0 - 15.0 g/dL   HCT 24.1 (L) 36.0 - 46.0 %   MCV 75.5 (L) 78.0 - 100.0 fL   MCH 24.1 (L) 26.0 - 34.0 pg   MCHC 32.0 30.0 - 36.0 g/dL   RDW 15.3 11.5 - 15.5 %   Platelets 209 150 - 400 K/uL  Basic metabolic panel  Result Value Ref Range   Sodium 140 135 - 145 mmol/L   Potassium 3.3 (L) 3.5 -  5.1 mmol/L   Chloride 108 101 - 111 mmol/L   CO2 26 22 - 32 mmol/L   Glucose, Bld 109 (H) 65 - 99 mg/dL   BUN 18 6 - 20 mg/dL   Creatinine, Ser 0.71 0.44 - 1.00 mg/dL   Calcium 8.7 (L) 8.9 - 10.3 mg/dL   GFR calc non Af Amer >60 >60 mL/min   GFR calc Af Amer >60 >60 mL/min   Anion gap 6 5 - 15  Glucose, capillary  Result Value Ref Range   Glucose-Capillary 102 (H) 65 - 99 mg/dL   Comment 1 Notify RN    Comment 2 Document in Chart   CBC  Result Value Ref Range   WBC 6.8 4.0 - 10.5 K/uL   RBC 3.33 (L) 3.87 - 5.11 MIL/uL   Hemoglobin 8.0 (L) 12.0 - 15.0 g/dL   HCT 25.2 (L) 36.0 - 46.0 %   MCV 75.7 (L) 78.0 - 100.0 fL   MCH 24.0 (L) 26.0 - 34.0 pg   MCHC 31.7 30.0 - 36.0 g/dL   RDW 15.3 11.5 - 15.5 %   Platelets 228 150 - 400 K/uL  Glucose, capillary  Result Value Ref Range   Glucose-Capillary 104 (H) 65 - 99 mg/dL   Comment 1 Notify RN    Comment 2 Document in Chart   CBC  Result Value Ref Range   WBC 6.6 4.0 - 10.5 K/uL   RBC 3.40 (L) 3.87 - 5.11 MIL/uL   Hemoglobin 8.0 (L) 12.0 - 15.0 g/dL   HCT 26.1 (L) 36.0 - 46.0 %   MCV 76.8 (L) 78.0 - 100.0 fL   MCH 23.5 (L) 26.0 - 34.0 pg   MCHC 30.7 30.0 - 36.0 g/dL   RDW 15.6 (H) 11.5 - 15.5 %   Platelets 230 150 - 400 K/uL  Glucose, capillary  Result Value Ref Range   Glucose-Capillary 99 65 - 99 mg/dL  Glucose, capillary  Result Value Ref Range   Glucose-Capillary  91 65 - 99 mg/dL  Glucose, capillary  Result Value Ref Range   Glucose-Capillary 96 65 - 99 mg/dL   Comment 1 Notify RN    Comment 2 Document in Chart   Glucose, capillary  Result Value Ref Range   Glucose-Capillary 96 65 - 99 mg/dL   Comment 1 Notify RN    Comment 2 Document in Chart   Glucose, capillary  Result Value Ref Range   Glucose-Capillary 88 65 - 99 mg/dL   Comment 1 Notify RN    Comment 2 Document in Chart   Glucose, capillary  Result Value Ref Range   Glucose-Capillary 77 65 - 99 mg/dL   Comment 1 Notify RN    Comment 2 Document in  Chart   Glucose, capillary  Result Value Ref Range   Glucose-Capillary 101 (H) 65 - 99 mg/dL  Glucose, capillary  Result Value Ref Range   Glucose-Capillary 95 65 - 99 mg/dL  Glucose, capillary  Result Value Ref Range   Glucose-Capillary 99 65 - 99 mg/dL  Glucose, capillary  Result Value Ref Range   Glucose-Capillary 92 65 - 99 mg/dL  Glucose, capillary  Result Value Ref Range   Glucose-Capillary 104 (H) 65 - 99 mg/dL  Glucose, capillary  Result Value Ref Range   Glucose-Capillary 103 (H) 65 - 99 mg/dL   Comment 1 Notify RN    Comment 2 Document in Chart   Glucose, capillary  Result Value Ref Range   Glucose-Capillary 105 (H) 65 - 99 mg/dL  CBG monitoring, ED  Result Value Ref Range   Glucose-Capillary 114 (H) 65 - 99 mg/dL  Occult blood, poc device  Result Value Ref Range   Fecal Occult Bld POSITIVE (A) NEGATIVE  Type and screen  Result Value Ref Range   ABO/RH(D) B POS    Antibody Screen NEG    Sample Expiration 05/06/2016    Unit Number [id number]    Blood Component Type RBC LR PHER1    Unit division 00    Status of Unit REL FROM [facility]    Transfusion Status OK TO TRANSFUSE    Crossmatch Result Compatible    Unit Number [id number]    Blood Component Type RED CELLS,LR    Unit division 00    Status of Unit ISSUED,FINAL    Transfusion Status OK TO TRANSFUSE    Crossmatch Result Compatible   Prepare RBC  Result Value Ref Range   Order Confirmation ORDER PROCESSED BY BLOOD BANK   ABO/Rh  Result Value Ref Range   ABO/RH(D) B POS   BPAM RBC  Result Value Ref Range   Blood Product Unit Number [id number]    Unit Type and Rh 5100    Blood Product Expiration Date 381017510258    ISSUE DATE / TIME [id number]    Blood Product Unit Number [id number]    PRODUCT CODE [id number]    Unit Type and Rh 5100    Blood Product Expiration Date 712458099833    Due to see rad therapist next wk for vulvar cancer   Continues to feel quite weak which is certainly  understandable. Unfortunately reports inability to take iron supplement. In the past. Next  Was given just 1 unit of blood. Patient had some improvement. But not as much as I would normally have hoped for in the hospital. Discussed at length.  Results of endoscopy discussed no obvious source of bleeding. Next  Patient reports no blood loss anywhere this time.  Patient reports sugar control good  Review of Systems  Neurological: Positive for weakness.       Objective:   Physical Exam Alert and oriented, vitals reviewed and stable, patient is somewhat pale and weak appearing ENT-TM's and ext canals WNL bilat via otoscopic exam Soft palate, tonsils and post pharynx WNL via oropharyngeal exam Neck-symmetric, no masses; thyroid nonpalpable and nontender Pulmonary-no tachypnea or accessory muscle use; Clear without wheezes via auscultation Card--no abnrml murmurs, rhythm reg and rate WNL Carotid pulses symmetric, without bruits        Assessment & Plan:  Impression 1 GI blood loss chronic with microcytic MCV. Received 1 unit packed red blood cells. Still very suboptimum numbers. Discussed. Strongly encouraged to try iron Cannon gluconate form along with multivitamin. Warning signs discussed carefully. Continue same diabetes management. Follow-up with CBC. If does not improve at follow-up would recommend consideration towards IV iron. Warning signs discussed carefully. Also long discussion held about patient's ongoing challenges with vulvar cancer treatment

## 2016-05-08 NOTE — Patient Instructions (Addendum)
Centrum silver one tab daily  Iron gluconate ask your pharm where? One daily   Call us if you can not tolerate

## 2016-05-09 ENCOUNTER — Encounter (HOSPITAL_COMMUNITY): Payer: Self-pay | Admitting: Internal Medicine

## 2016-05-09 ENCOUNTER — Ambulatory Visit: Payer: PPO

## 2016-05-10 ENCOUNTER — Ambulatory Visit: Payer: PPO | Admitting: Family Medicine

## 2016-05-10 ENCOUNTER — Ambulatory Visit: Payer: PPO

## 2016-05-13 ENCOUNTER — Ambulatory Visit: Payer: PPO

## 2016-05-14 ENCOUNTER — Ambulatory Visit: Payer: PPO

## 2016-05-15 ENCOUNTER — Ambulatory Visit
Admission: RE | Admit: 2016-05-15 | Discharge: 2016-05-15 | Disposition: A | Discharge status: Home / Self Care | Payer: PPO | Source: Ambulatory Visit | Attending: Radiation Oncology | Admitting: Radiation Oncology

## 2016-05-15 ENCOUNTER — Ambulatory Visit: Payer: PPO

## 2016-05-15 ENCOUNTER — Ambulatory Visit: Payer: PPO | Admitting: Radiation Oncology

## 2016-05-15 ENCOUNTER — Encounter: Payer: Self-pay | Admitting: Radiation Oncology

## 2016-05-15 ENCOUNTER — Telehealth: Payer: Self-pay | Admitting: Oncology

## 2016-05-15 VITALS — BP 123/67 | HR 96 | Temp 98.2°F | Ht 64.0 in | Wt 183.8 lb

## 2016-05-15 DIAGNOSIS — C519 Malignant neoplasm of vulva, unspecified: Secondary | ICD-10-CM | POA: Diagnosis not present

## 2016-05-15 DIAGNOSIS — Z7984 Long term (current) use of oral hypoglycemic drugs: Secondary | ICD-10-CM | POA: Diagnosis not present

## 2016-05-15 DIAGNOSIS — Z9079 Acquired absence of other genital organ(s): Secondary | ICD-10-CM | POA: Diagnosis not present

## 2016-05-15 DIAGNOSIS — E119 Type 2 diabetes mellitus without complications: Secondary | ICD-10-CM | POA: Diagnosis not present

## 2016-05-15 DIAGNOSIS — M1990 Unspecified osteoarthritis, unspecified site: Secondary | ICD-10-CM | POA: Diagnosis not present

## 2016-05-15 DIAGNOSIS — Z8249 Family history of ischemic heart disease and other diseases of the circulatory system: Secondary | ICD-10-CM | POA: Diagnosis not present

## 2016-05-15 DIAGNOSIS — I350 Nonrheumatic aortic (valve) stenosis: Secondary | ICD-10-CM | POA: Diagnosis not present

## 2016-05-15 DIAGNOSIS — Z510 Encounter for antineoplastic radiation therapy: Secondary | ICD-10-CM | POA: Diagnosis not present

## 2016-05-15 DIAGNOSIS — Z79899 Other long term (current) drug therapy: Secondary | ICD-10-CM | POA: Diagnosis not present

## 2016-05-15 DIAGNOSIS — Z881 Allergy status to other antibiotic agents status: Secondary | ICD-10-CM | POA: Diagnosis not present

## 2016-05-15 DIAGNOSIS — E559 Vitamin D deficiency, unspecified: Secondary | ICD-10-CM | POA: Diagnosis not present

## 2016-05-15 DIAGNOSIS — Z90710 Acquired absence of both cervix and uterus: Secondary | ICD-10-CM | POA: Diagnosis not present

## 2016-05-15 DIAGNOSIS — Z96642 Presence of left artificial hip joint: Secondary | ICD-10-CM | POA: Diagnosis not present

## 2016-05-15 DIAGNOSIS — D509 Iron deficiency anemia, unspecified: Secondary | ICD-10-CM | POA: Diagnosis not present

## 2016-05-15 DIAGNOSIS — Z96651 Presence of right artificial knee joint: Secondary | ICD-10-CM | POA: Diagnosis not present

## 2016-05-15 DIAGNOSIS — Z888 Allergy status to other drugs, medicaments and biological substances status: Secondary | ICD-10-CM | POA: Diagnosis not present

## 2016-05-15 DIAGNOSIS — Z7982 Long term (current) use of aspirin: Secondary | ICD-10-CM | POA: Diagnosis not present

## 2016-05-15 DIAGNOSIS — I10 Essential (primary) hypertension: Secondary | ICD-10-CM | POA: Diagnosis not present

## 2016-05-15 DIAGNOSIS — Z808 Family history of malignant neoplasm of other organs or systems: Secondary | ICD-10-CM | POA: Diagnosis not present

## 2016-05-15 LAB — CBC WITH DIFFERENTIAL/PLATELET
BASO%: 0.6 % (ref 0.0–2.0)
BASOS ABS: 0.1 10*3/uL (ref 0.0–0.1)
EOS ABS: 0.1 10*3/uL (ref 0.0–0.5)
EOS%: 1.1 % (ref 0.0–7.0)
HCT: 27 % — ABNORMAL LOW (ref 34.8–46.6)
HEMOGLOBIN: 8.4 g/dL — AB (ref 11.6–15.9)
LYMPH%: 23.3 % (ref 14.0–49.7)
MCH: 22.8 pg — ABNORMAL LOW (ref 25.1–34.0)
MCHC: 31 g/dL — ABNORMAL LOW (ref 31.5–36.0)
MCV: 73.5 fL — AB (ref 79.5–101.0)
MONO#: 0.8 10*3/uL (ref 0.1–0.9)
MONO%: 10.3 % (ref 0.0–14.0)
NEUT%: 64.7 % (ref 38.4–76.8)
NEUTROS ABS: 5.3 10*3/uL (ref 1.5–6.5)
PLATELETS: 306 10*3/uL (ref 145–400)
RBC: 3.68 10*6/uL — AB (ref 3.70–5.45)
RDW: 18.8 % — ABNORMAL HIGH (ref 11.2–14.5)
WBC: 8.1 10*3/uL (ref 3.9–10.3)
lymph#: 1.9 10*3/uL (ref 0.9–3.3)

## 2016-05-15 LAB — BASIC METABOLIC PANEL
Anion Gap: 12 mEq/L — ABNORMAL HIGH (ref 3–11)
BUN: 18.5 mg/dL (ref 7.0–26.0)
CHLORIDE: 106 meq/L (ref 98–109)
CO2: 26 meq/L (ref 22–29)
Calcium: 9.6 mg/dL (ref 8.4–10.4)
Creatinine: 0.8 mg/dL (ref 0.6–1.1)
EGFR: 67 mL/min/{1.73_m2} — ABNORMAL LOW (ref >90)
Glucose: 110 mg/dl (ref 70–140)
Potassium: 4.6 mEq/L (ref 3.5–5.1)
Sodium: 144 mEq/L (ref 136–145)

## 2016-05-15 NOTE — Telephone Encounter (Signed)
Left a message for patient regarding her CBC results.  Requested a return call.

## 2016-05-15 NOTE — Progress Notes (Addendum)
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 Vulva, vulvectomy, left - INVASIVE SQUAMOUS CELL CARCINOMA, NON-KERATINIZING, SPANNING 2.4 CM IN WIDTH X 0.4 CM IN DEPTH. - CARCINOMA IS BROADLY PRESENT AT THE 6 O'CLOCK MARGIN OF SPECIMEN #1. - SEE ONCOLOGY TABLE BELOW. 2. Vulva, excision, lateral margin - SQUAMOUS LINED EPITHELIUM WITH LICHEN SCLEROSUS ET ATROPHICUS. 3. Vulva, excision, medial margin - SQUAMOUS LINED EPITHELIUM WITH LICHEN SCLEROSUS ET ATROPHICUS.  02/27/16 Diagnosis 1. Lymph nodes, regional resection, right inguinal - METASTATIC CARCINOMA IN 1 OF 6 LYMPH NODES (1/6). 2. Vulva, excision, right - INVASIVE KERATINIZING SQUAMOUS CELL CARCINOMA, MODERATELY DIFFERENTIATED, SPANNING 2.1 CM WITH A DEPTH OF INVASION OF 0.6 CM. - THE SURGICAL RESECTION MARGIN ARE NEGATIVE FOR CARCINOMA. - DIFFUSE LICHEN SCLEROSUS ET ATROPHICUS. - SEE ONCOLOGY TABLE BELOW.  02/22/16 Diagnosis Labium, biopsy, right mid - INVASIVE SQUAMOUS CELL CARCINOMA.  Past/Anticipated interventions by Gyn/Onc surgery, if any: 04/09/16 -Procedure: RADICAL LEFT VULVECTOMY;  Surgeon: Everitt Amber, MD, 02/27/16 - Procedure: RADICAL RIGHT VULVECTOMY WITH RIGHT INGUINAL LYMPH NODE DISSECTION;  Surgeon: Nancy Marus, MD   Past/Anticipated interventions by medical oncology, if any: no  Weight changes, if any: yes reports having a poor appetite.  Has lost 6 lbs in last week.  Bowel/Bladder complaints, if any: no  Nausea/Vomiting, if any: no  Pain issues, if any:  Reports having pain in her left hip.  She reports having a burning feeling after her blood transfusion.  SAFETY ISSUES:  Prior radiation? no  Pacemaker/ICD? no  Possible current pregnancy? no  Is the patient on methotrexate? no  Current Complaints  / other details:  Dr. Denman George is recommending Radiation to bilateral groins and vulva due to positive margin and positive left nodes.  Patient is here with husband and daughter in law.  Patient reports feeling weak and is wondering if her blood counts are down again.    BP 123/67 (BP Location: Left Arm, Patient Position: Sitting)   Pulse 96   Temp 98.2 F (36.8 C) (Oral)   Ht 5\' 4"  (1.626 m)   Wt 183 lb 12.8 oz (83.4 kg)   SpO2 98%   BMI 31.55 kg/m    Wt Readings from Last 3 Encounters:  05/15/16 183 lb 12.8 oz (83.4 kg)  05/08/16 192 lb 6.4 oz (87.3 kg)  05/03/16 195 lb (88.5 kg)

## 2016-05-15 NOTE — Progress Notes (Signed)
Please see the Nurse Progress Note in the MD Initial Consult Encounter for this patient. 

## 2016-05-15 NOTE — Progress Notes (Signed)
Radiation Oncology         ([phone]) [phone] ________________________________  Name: [name] MRN: [id number]  Date: 05/15/2016  DOB: November 17, 1940  Re-Consultation Note  CC: Mickie Hillier, MD  Everitt Amber, MD    ICD-9-CM ICD-10-CM   1. 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 Since the initial consult, the patient underwent Additional surgery on March 20 with a partial radical left vulvectomy. Pathology from this surgery as below:  Vulva, vulvectomy, left - INVASIVE SQUAMOUS CELL CARCINOMA, NON-KERATINIZING, SPANNING 2.4 CM IN WIDTH X 0.4 CM IN DEPTH. - CARCINOMA IS BROADLY PRESENT AT THE 6 O'CLOCK MARGIN OF SPECIMEN #1. - SEE ONCOLOGY TABLE BELOW.    Patient reports a 6 lb weight loss last week due to poor appetite. She reports pain to the left hip. She also notes a burning sensation following her blood transfusion. She denies bowel/bladder complaints, or nausea/vomiting. She complains of generalized weakness today.         Patient was recently discharged from the hospital after a 4 day stay from 4/13 through April 17 at [facility]. She found to have symptomatic microcytic anemia with presumed GI bleed. She did undergo EGD and colonoscopy with no active bleeding noted.                ALLERGIES:  is allergic to celebrex [celecoxib]; doxycycline; lodine [etodolac]; nabumetone; tetracycline; and xarelto [rivaroxaban].  Meds: Current Outpatient Prescriptions  Medication Sig Dispense Refill  . aspirin 81 MG tablet Take 81 mg by mouth daily.    Marland Kitchen CINNAMON PO Take 2 capsules by mouth daily. 2,000 mg daily     . ferrous sulfate 325 (65 FE) MG tablet Take 325 mg by mouth daily with breakfast.    . furosemide (LASIX) 40 MG tablet TAKE ONE TABLET BY MOUTH ONCE DAILY. 90 tablet 1  .  gabapentin (NEURONTIN) 100 MG capsule TAKE 1 CAPSULE BY MOUTH THREE TIMES A DAY. 270 capsule 0  . HYDROcodone-acetaminophen (NORCO) 5-325 MG tablet Take 1 tablet by mouth every 6 (six) hours as needed for moderate pain. 36 tablet 0  . losartan (COZAAR) 50 MG tablet TAKE ONE TABLET BY MOUTH ONCE DAILY. 90 tablet 1  . metFORMIN (GLUCOPHAGE) 500 MG tablet TAKE (1) TABLET BY MOUTH TWICE DAILY. 180 tablet 0  . Multiple Vitamins-Minerals (MULTIVITAMIN ADULT PO) Take by mouth.    . potassium chloride SA (K-DUR,KLOR-CON) 20 MEQ tablet Take 1 tablet (20 mEq total) by mouth daily. 90 tablet 3  . Cholecalciferol (VITAMIN D3 PO) Take 1,000 Units by mouth 2 (two) times daily.    . Multiple Minerals-Vitamins (CALCIUM-MAGNESIUM-ZINC-D3 PO) Take 2 tablets by mouth 2 (two) times daily.     No current facility-administered medications for this encounter.     Physical Findings: The patient is in no acute distress. Patient is alert and oriented.  height is 5\' 4"  (1.626 m) and weight is 183 lb 12.8 oz (83.4 kg). Her oral temperature is 98.2 F (36.8 C). Her blood pressure is 123/67 and her pulse is 96. Her oxygen saturation is 98%. .  No significant changes. Lungs are clear to auscultation bilaterally. Heart has regular rate and rhythm. No palpable cervical, supraclavicular, or axillary adenopathy. Abdomen soft, non-tender, normal bowel sounds.  Scar healed well along right  groin, no palpable lymph  nodes in right groin. Left groin no palpable lymph nodes. Left labia healing well with no signs of drainage or infection.  Patient unable to tolerate a detailed exam in light of her surgery and associated pain.   Lab Findings: Lab Results  Component Value Date   WBC 6.6 05/05/2016   HGB 8.0 (L) 05/05/2016   HCT 26.1 (L) 05/05/2016   MCV 76.8 (L) 05/05/2016   PLT 230 05/05/2016    Radiographic Findings: Dg Chest 2 View  Result Date: 05/03/2016 CLINICAL DATA:  Shortness of breath for 2-3 days EXAM: CHEST  2  VIEW COMPARISON:  Chest CT 04/01/2016 FINDINGS: The previously seen small bilateral pulmonary nodules cannot be appreciated by plain film. Heart is normal size. Lungs are clear. No effusions or acute bony abnormality. IMPRESSION: No active cardiopulmonary disease. Unable to visualize the previously seen small bilateral pulmonary nodules. These should be followed with CT. Electronically Signed   By: Rolm Baptise M.D.   On: 05/03/2016 11:54   Ct Angio Chest Pe W/cm &/or Wo Cm  Result Date: 05/03/2016 CLINICAL DATA:  76 year old female with shortness of breath for 2 days. Recently diagnosed and treated for ball vulva cancer in January and February. EXAM: CT ANGIOGRAPHY CHEST WITH CONTRAST TECHNIQUE: Multidetector CT imaging of the chest was performed using the standard protocol during bolus administration of intravenous contrast. Multiplanar CT image reconstructions and MIPs were obtained to evaluate the vascular anatomy. CONTRAST:  100 mL Isovue 370 COMPARISON:  Chest CT 04/01/2016. FINDINGS: Cardiovascular: Good contrast bolus timing in the pulmonary arterial tree. No focal filling defect identified in the pulmonary arteries to suggest acute pulmonary embolism. Cardiac size is at the upper limits of normal to mildly enlarged. No pericardial effusion. Negative visible aorta. There is evidence of left coronary artery atherosclerosis. Mediastinum/Nodes: Stable hypodense right thyroid nodule. No mediastinal or hilar lymphadenopathy. Lungs/Pleura: Lower lung volumes. Major airways are patent. Perihilar and dependent atelectasis. Stable small superior segment right lower lobe subpleural nodule measuring 8-9 mm on series 7, image 41 small calcified granuloma along the left superior major fissure incidentally re - identified. Stable small lateral left lower lobe subpleural nodule on series 7, image 47. No pleural effusion.  No confluent or suspicious pulmonary opacity. Upper Abdomen: Negative visualized liver, spleen,  pancreas, adrenal glands, kidneys, and bowel in the upper abdomen. Musculoskeletal: Disc and endplate degeneration in the thoracic spine. No acute or suspicious osseous lesion. Review of the MIP images confirms the above findings. IMPRESSION: 1.  No evidence of acute pulmonary embolus. 2. Calcified coronary artery atherosclerosis. Negative visible aorta. 3. Pulmonary atelectasis with no other acute pulmonary process. The small subpleural pulmonary nodules seen by CT last month are stable. Electronically Signed   By: Genevie Ann M.D.   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 I would agree with Dr. Serita Grit recommendation for radiation treatment to the bilateral inguinal area as well as the vulva area particularly given the most recent surgery showing a positive margin. I discussed the course of treatment, side effects, and potential toxicities with the patient, her husband, and her daughter in law. She appears to understand and wishes to proceed with treatment. A consent form was signed and a copy was placed in the patient's file.  Plan: Patient will proceed to the lab for blood work following today's appointment to rule out additional anemia issues. The patient will be scheduled for CT simulation and treatment  planning in the next several days.. She will be seen again by Dr. Denman George on 05/29/16. ____________________________________   This document serves as a record of services personally performed by Gery Pray, MD. It was created on his behalf by Bethann Humble, a trained medical scribe. The creation of this record is based on the scribe's personal observations and the provider's statements to them. This document has been checked and approved by the attending provider.

## 2016-05-15 NOTE — Telephone Encounter (Signed)
[name] called back and was advised of her lab results.  She verbalized agreement and said she was concerned because she has been "winded" when walking long distances.  Advised her that we will call tomorrow to see how she is feeling.

## 2016-05-16 ENCOUNTER — Ambulatory Visit: Payer: PPO

## 2016-05-17 ENCOUNTER — Ambulatory Visit: Payer: PPO

## 2016-05-20 ENCOUNTER — Telehealth: Payer: Self-pay | Admitting: Family Medicine

## 2016-05-20 DIAGNOSIS — R7989 Other specified abnormal findings of blood chemistry: Secondary | ICD-10-CM

## 2016-05-20 NOTE — Telephone Encounter (Signed)
Spoke with patient and informed her per Dr.Steve Luking- Labs have been ordered. Patient verbalized understanding.  

## 2016-05-20 NOTE — Telephone Encounter (Signed)
Blood work ordered in [employer] for patient to return call

## 2016-05-20 NOTE — Telephone Encounter (Signed)
Cbc reviewed, hgb up slightly, rec repeating cbc in 4 weeks, cst

## 2016-05-20 NOTE — Telephone Encounter (Signed)
Patient is suppose to have blood work done this week for Dr. Richardson Landry but she said Dr. Sondra Come just ran a bunch of labs so she wants [place] to check and make sure she isn't getting duplicated labs.  Needs to know what to do. Call home number and if she doesn't answer just leave her a message on what, if any labs she needs to have done.

## 2016-05-23 DIAGNOSIS — D649 Anemia, unspecified: Secondary | ICD-10-CM | POA: Diagnosis not present

## 2016-05-24 LAB — CBC WITH DIFFERENTIAL/PLATELET
BASOS ABS: 0 10*3/uL (ref 0.0–0.2)
Basos: 0 %
EOS (ABSOLUTE): 0.1 10*3/uL (ref 0.0–0.4)
EOS: 2 %
Hematocrit: 29 % — ABNORMAL LOW (ref 34.0–46.6)
Hemoglobin: 8.8 g/dL — ABNORMAL LOW (ref 11.1–15.9)
IMMATURE GRANULOCYTES: 0 %
Immature Grans (Abs): 0 10*3/uL (ref 0.0–0.1)
Lymphocytes Absolute: 1.7 10*3/uL (ref 0.7–3.1)
Lymphs: 23 %
MCH: 22.6 pg — ABNORMAL LOW (ref 26.6–33.0)
MCHC: 30.3 g/dL — ABNORMAL LOW (ref 31.5–35.7)
MCV: 75 fL — ABNORMAL LOW (ref 79–97)
MONOS ABS: 0.6 10*3/uL (ref 0.1–0.9)
Monocytes: 8 %
NEUTROS PCT: 67 %
Neutrophils Absolute: 4.8 10*3/uL (ref 1.4–7.0)
PLATELETS: 319 10*3/uL (ref 150–379)
RBC: 3.89 x10E6/uL (ref 3.77–5.28)
RDW: 18.4 % — AB (ref 12.3–15.4)
WBC: 7.2 10*3/uL (ref 3.4–10.8)

## 2016-05-24 LAB — BASIC METABOLIC PANEL
BUN/Creatinine Ratio: 19 (ref 12–28)
BUN: 15 mg/dL (ref 8–27)
CALCIUM: 9.6 mg/dL (ref 8.7–10.3)
CHLORIDE: 101 mmol/L (ref 96–106)
CO2: 26 mmol/L (ref 18–29)
Creatinine, Ser: 0.77 mg/dL (ref 0.57–1.00)
GFR calc Af Amer: 86 mL/min/{1.73_m2} (ref >59)
GFR calc non Af Amer: 75 mL/min/{1.73_m2} (ref >59)
GLUCOSE: 155 mg/dL — AB (ref 65–99)
POTASSIUM: 4.3 mmol/L (ref 3.5–5.2)
Sodium: 144 mmol/L (ref 134–144)

## 2016-05-24 LAB — FERRITIN: Ferritin: 11 ng/mL — ABNORMAL LOW (ref 15–150)

## 2016-05-27 ENCOUNTER — Ambulatory Visit
Admission: RE | Admit: 2016-05-27 | Discharge: 2016-05-27 | Disposition: A | Discharge status: Home / Self Care | Payer: PPO | Source: Ambulatory Visit | Attending: Radiation Oncology | Admitting: Radiation Oncology

## 2016-05-27 DIAGNOSIS — C519 Malignant neoplasm of vulva, unspecified: Secondary | ICD-10-CM | POA: Diagnosis not present

## 2016-05-27 DIAGNOSIS — Z510 Encounter for antineoplastic radiation therapy: Secondary | ICD-10-CM | POA: Diagnosis not present

## 2016-05-27 NOTE — Addendum Note (Signed)
Addended by: Dairl Ponder on: 05/27/2016 03:46 PM   Modules accepted: Orders

## 2016-05-27 NOTE — Progress Notes (Signed)
  Radiation Oncology         [phone] ________________________________  Name: [name] MRN: [id number]  Date: 05/27/2016  DOB: [birth year]  SIMULATION AND TREATMENT PLANNING NOTE    ICD-9-CM ICD-10-CM   1. Vulva cancer (HCC) 184.4 C51.9     DIAGNOSIS: Stage IIIA (pT1b, pN1a) moderately differentiated squamous cell carcinoma of the right and left vulva  NARRATIVE:  The patient was brought to the [place].  Identity was confirmed.  All relevant records and images related to the planned course of therapy were reviewed.  The patient freely provided informed written consent to proceed with treatment after reviewing the details related to the planned course of therapy. The consent form was witnessed and verified by the simulation staff.  Then, the patient was set-up in a stable reproducible  supine position for radiation therapy.  CT images were obtained.  Surface markings were placed.  The CT images were loaded into the planning software.  Then the target and avoidance structures were contoured.  Treatment planning then occurred.  The radiation prescription was entered and confirmed.  Then, I designed and supervised the construction of a total of 2 medically necessary complex treatment devices.  I have requested : Intensity Modulated Radiotherapy (IMRT) is medically necessary for this case for the following reason:  Dose homogeneity And avoidance of the femoral head and neck area bilaterally.  I have ordered:dose calc.  PLAN:  The patient will receive 45 Gy in 25 fractions Directed at the bilateral inguinal and vulvar area. The patient will then proceed with a boost to the vulvar region the light of the positive surgical margin and continue to a dose of 63 gray.  -----------------------------------  Blair Promise, PhD, MD  This document serves as a record of services personally performed by Gery Pray, MD. It was created on his behalf by Bethann Humble, a trained medical  scribe. The creation of this record is based on the scribe's personal observations and the provider's statements to them. This document has been checked and approved by the attending provider.

## 2016-05-28 DIAGNOSIS — L570 Actinic keratosis: Secondary | ICD-10-CM | POA: Diagnosis not present

## 2016-05-29 ENCOUNTER — Ambulatory Visit: Payer: PPO | Attending: Gynecologic Oncology | Admitting: Gynecologic Oncology

## 2016-05-29 ENCOUNTER — Encounter: Payer: Self-pay | Admitting: Gynecologic Oncology

## 2016-05-29 VITALS — BP 136/61 | HR 95 | Temp 98.2°F | Resp 20 | Wt 193.2 lb

## 2016-05-29 DIAGNOSIS — C519 Malignant neoplasm of vulva, unspecified: Secondary | ICD-10-CM | POA: Diagnosis not present

## 2016-05-29 DIAGNOSIS — Z7982 Long term (current) use of aspirin: Secondary | ICD-10-CM | POA: Insufficient documentation

## 2016-05-29 DIAGNOSIS — Z7984 Long term (current) use of oral hypoglycemic drugs: Secondary | ICD-10-CM | POA: Insufficient documentation

## 2016-05-29 DIAGNOSIS — Z7189 Other specified counseling: Secondary | ICD-10-CM

## 2016-05-29 DIAGNOSIS — R918 Other nonspecific abnormal finding of lung field: Secondary | ICD-10-CM | POA: Insufficient documentation

## 2016-05-29 DIAGNOSIS — Z79899 Other long term (current) drug therapy: Secondary | ICD-10-CM | POA: Insufficient documentation

## 2016-05-29 NOTE — Progress Notes (Signed)
Consult Note: Gyn-Onc  Consult was requested by Dr. Elonda Husky for the evaluation of [name] 76 y.o. female   CC:  Chief Complaint  Patient presents with  . Vulvar Cancer    Assessment/Plan:  [name]  is a 76 y.o.  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 Positive inferior (6 o'clock) margin on left vulvectomy.  1. Radiation to bilateral groins and vulva due to positive margin and positive left nodes, has healed adequately to start. 2. Chest nodules on CT scan - repeat CT in 3 months for follow-up.   HPI: [name] is a 76 year old woman who is seen in consultation at the request of Dr Elonda Husky for a right labial lesion.    The patient reports that she began feeling pruritis of the vulva in October, 2017. She was seen by her PCP who saw no lesion and prescribed estradiol.  The pruritis persisted and became worse and she began to feel a nodule in the area and therefore again saw her PCP followed by a referral to gynecologist, Dr Elonda Husky on 02/20/16. The nodular lesion was appreciated on the right vulva. No biopsy was taken at that time. Biopsy was performed on 02/22/16 and pathology from biopsy confirmed invasive SCC.   CT scan abdo/pelvis was negative for evidence of suspicious nodes or distant mets.  On 02/22/16 she underwent right radical vulvectomy and right inguinal lymphadenectomy with Dr Nancy Marus. Final pathology revealed: a 2.1cm moderately differentiated SCC of the right vulva. Margins were negative (closest was 3 o'clock, 31mm). Depth of invasion was 72mm. 1 of 6 inguinofemoral lymph nodes (right) was positive for metastatic disease.   CT chest on 04/01/16 showed: Multiple pulmonary nodules as detailed above, generally in the 31mm and less average diameter range although with 1 right lower lobe subpleural nodule measuring about 8 mm in diameter. These likely warrant surveillance. Hypodense right  thyroid nodule, 1.1 cm in long axis. Consider further evaluation with thyroid ultrasound. If patient is clinically hyperthyroid, consider nuclear medicine thyroid uptake and scan. Stable hypodense lateral splenic lesion, likely a cyst.  Postoperatively after her first vulvectomy she developed irritation on the left vulvar lip. She saw [place] for evaluation in March and complained of this irritation. A nodular lesion was seen in the mid portion of the left labia minora in a "kissing" location to where the primary lesion had been. It was biopsied and confirmed as recurrent SCC of the vulva.  On 04/09/16 she was again taken to the OR, this time for a radical left vulvectomy. A decision was made to not perform left inguinal lymphadenectomy, so as to not delay radiation further (with an additional groin wound to heal), but to instead extend the radiation to both groins.  Pathology from this surgery confirmed a poorly differentiated squamous cell carcinoma of the left vulva with 51mm of invasion. The inferior 6 o'clock margin was "broadly" positive (though macroscopically this margin was grossly negative.  Interval Hx:  She feels better after this most recent surgery with respect to vulvar irritation. She denies fever or drainage.  Current Meds:  Outpatient Encounter Prescriptions as of 05/29/2016  Medication Sig  . aspirin 81 MG tablet Take 81 mg by mouth daily.  . Cholecalciferol (VITAMIN D3 PO) Take 1,000 Units by mouth 2 (two) times daily.  Marland Kitchen CINNAMON PO Take 2 capsules by mouth daily. 2,000 mg daily   . ferrous sulfate 325 (  65 FE) MG tablet Take 325 mg by mouth daily with breakfast.  . furosemide (LASIX) 40 MG tablet TAKE ONE TABLET BY MOUTH ONCE DAILY.  Marland Kitchen gabapentin (NEURONTIN) 100 MG capsule TAKE 1 CAPSULE BY MOUTH THREE TIMES A DAY.  Marland Kitchen HYDROcodone-acetaminophen (NORCO) 5-325 MG tablet Take 1 tablet by mouth every 6 (six) hours as needed for moderate pain.  Marland Kitchen losartan (COZAAR) 50 MG tablet TAKE ONE  TABLET BY MOUTH ONCE DAILY.  . metFORMIN (GLUCOPHAGE) 500 MG tablet TAKE (1) TABLET BY MOUTH TWICE DAILY.  . Multiple Minerals-Vitamins (CALCIUM-MAGNESIUM-ZINC-D3 PO) Take 2 tablets by mouth 2 (two) times daily.  . Multiple Vitamins-Minerals (MULTIVITAMIN ADULT PO) Take by mouth.  . potassium chloride SA (K-DUR,KLOR-CON) 20 MEQ tablet Take 1 tablet (20 mEq total) by mouth daily.   No facility-administered encounter medications on file as of 05/29/2016.     Allergy:  Allergies  Allergen Reactions  . Celebrex [Celecoxib]     GI upset   . Doxycycline Nausea Only    Gi upset  . Lodine [Etodolac] Nausea Only    Gi upset  . Nabumetone     (relafen)  . Tetracycline   . Xarelto [Rivaroxaban] Hives    Social Hx:   Social History   Social History  . Marital status: Married    Spouse name: N/A  . Number of children: 2  . Years of education: N/A   Occupational History  . Not on file.   Social History Main Topics  . Smoking status: Never Smoker  . Smokeless tobacco: Never Used  . Alcohol use No  . Drug use: No  . Sexual activity: Not on file   Other Topics Concern  . Not on file   Social History Narrative  . No narrative on file    Past Surgical Hx:  Past Surgical History:  Procedure Laterality Date  . ABDOMINAL HYSTERECTOMY     complete  . BACK SURGERY     upper  with plate frontal fusion and lower x 2  . COLONOSCOPY    . COLONOSCOPY N/A 05/05/2016   Procedure: COLONOSCOPY;  Surgeon: Daneil Dolin, MD;  [facility]: [facility];  Service: Endoscopy;  Laterality: N/A;  . ESOPHAGOGASTRODUODENOSCOPY N/A 05/04/2016   Procedure: ESOPHAGOGASTRODUODENOSCOPY (EGD);  Surgeon: Daneil Dolin, MD;  Location: [facility];  Service: Endoscopy;  Laterality: N/A;  . GIVENS CAPSULE STUDY N/A 05/06/2016   Procedure: GIVENS CAPSULE STUDY;  Surgeon: Daneil Dolin, MD;  Location: [facility];  Service: Endoscopy;  Laterality: N/A;  . KNEE SURGERY Left    arthroscopy  . RADICAL  VULVECTOMY Left 04/09/2016   Procedure: RADICAL LEFT VULVECTOMY;  Surgeon: Everitt Amber, MD;  Location: [facility];  Service: Gynecology;  Laterality: Left;  . TOE SURGERY Left   . TONSILLECTOMY    . TOTAL HIP ARTHROPLASTY Left 10/27/2013   Procedure: LEFT TOTAL HIP ARTHROPLASTY;  Surgeon: Tobi Bastos, MD;  Location: [facility];  Service: Orthopedics;  Laterality: Left;  . TOTAL KNEE ARTHROPLASTY Right   . VULVA /PERINEUM BIOPSY  02/22/2016  . VULVECTOMY Right 02/27/2016   Procedure: RADICAL RIGHT VULVECTOMY WITH RIGHT INGUINAL LYMPH NODE DISSECTION;  Surgeon: Nancy Marus, MD;  Location: [facility];  Service: Gynecology;  Laterality: Right;    Past Medical Hx:  Past Medical History:  Diagnosis Date  . Aortic stenosis, mild   . Aortic valve stenosis, mild   . Arthritis   . Cancer ([place])    vulvar   .  Complication of anesthesia   . Diabetes mellitus without complication ([place])    type 2  . Heart murmur   . History of blood transfusion age 45 or 10 months old  . Hypertension   . IFG (impaired fasting glucose)   . PONV (postoperative nausea and vomiting)    years ago with back surgery-nausea  . Vitamin D deficiency     Past Gynecological History:  Remote hx of hysterectomy for fibroids. No hx of abnromal paps (last pap remote) No LMP recorded. Patient has had a hysterectomy.  Family Hx:  Family History  Problem Relation Age of Onset  . Hypertension Mother   . Hypertension Father   . Throat cancer Father   . Cancer Sister     Breast  . Hypertension Brother     Review of Systems:  Constitutional  Feels well,    ENT Normal appearing ears and nares bilaterally Skin/Breast  + vulvar pruritis. Cardiovascular  No chest pain, shortness of breath, or edema  Pulmonary  No cough or wheeze.  Gastro Intestinal  No nausea, vomitting, or diarrhoea. No bright red blood per rectum, no abdominal pain, change in bowel movement, or constipation.  Genito Urinary  No frequency, urgency, dysuria, no  bleeding Musculo Skeletal  No myalgia, arthralgia, joint swelling or pain  Neurologic  No weakness, numbness, change in gait,  Psychology  No depression, anxiety, insomnia.   Vitals:  Blood pressure 136/61, pulse 95, temperature 98.2 F (36.8 C), resp. rate 20, weight 193 lb 3.2 oz (87.6 kg).  Physical Exam: WD in NAD Neck  Supple NROM, without any enlargements.  Lymph Node Survey No cervical supraclavicular or inguinal adenopathy. Right groin incision well healed.  Cardiovascular  Pulse normal rate, regularity and rhythm. S1 and S2 normal.  Lungs  Clear to auscultation bilateraly, without wheezes/crackles/rhonchi. Good air movement.  Skin  No rash/lesions/breakdown  Psychiatry  Alert and oriented to person, place, and time  Abdomen  Normoactive bowel sounds, abdomen soft, non-tender and obese without evidence of hernia.  Back No CVA tenderness Genito Urinary  Vulva/vagina: vulvar incision completely healed. No infection. No raised or palpable lesions.   Bladder/urethra:  No lesions or masses, well supported bladder  Vagina: atrophic, no lesions  Cervix: surgically absent  Uterus: surgically absen  Adnexa: no palpable masses. Rectal  deferred Extremities  No bilateral cyanosis, clubbing or edema.  Donaciano Eva, MD  05/29/2016, 2:48 PM

## 2016-05-29 NOTE — Patient Instructions (Signed)
Dr. Sondra Come will make an appointment for you with Dr. Denman George after you have completed your radiation treatments.

## 2016-05-30 ENCOUNTER — Ambulatory Visit (INDEPENDENT_AMBULATORY_CARE_PROVIDER_SITE_OTHER): Payer: PPO | Admitting: Gastroenterology

## 2016-05-30 ENCOUNTER — Encounter: Payer: Self-pay | Admitting: Gastroenterology

## 2016-05-30 VITALS — BP 123/75 | HR 85 | Temp 97.8°F | Ht 64.0 in | Wt 192.2 lb

## 2016-05-30 DIAGNOSIS — D509 Iron deficiency anemia, unspecified: Secondary | ICD-10-CM | POA: Insufficient documentation

## 2016-05-30 DIAGNOSIS — K921 Melena: Secondary | ICD-10-CM

## 2016-05-30 NOTE — Patient Instructions (Addendum)
1. We will contact you after your next labs in a few weeks and discuss any necessary follow up at that time.  2. Continue daily iron.  3. Call if you see blood in the stool or black stools.    Iron-Rich Diet Iron is a mineral that helps your body to produce hemoglobin. Hemoglobin is a protein in your red blood cells that carries oxygen to your body's tissues. Eating too little iron may cause you to feel weak and tired, and it can increase your risk for infection. Eating enough iron is necessary for your body's metabolism, muscle function, and nervous system. Iron is naturally found in many foods. It can also be added to foods or fortified in foods. There are two types of dietary iron:  Heme iron. Heme iron is absorbed by the body more easily than nonheme iron. Heme iron is found in meat, poultry, and fish.  Nonheme iron. Nonheme iron is found in dietary supplements, iron-fortified grains, beans, and vegetables. You may need to follow an iron-rich diet if:  You have been diagnosed with iron deficiency or iron-deficiency anemia.  You have a condition that prevents you from absorbing dietary iron, such as:  Infection in your intestines.  Celiac disease. This involves long-lasting (chronic) inflammation of your intestines.  You do not eat enough iron.  You eat a diet that is high in foods that impair iron absorption.  You have lost a lot of blood.  You have heavy bleeding during your menstrual cycle.  You are pregnant. What is my plan? Your health care provider may help you to determine how much iron you need per day based on your condition. Generally, when a person consumes sufficient amounts of iron in the diet, the following iron needs are met:  Men.  43-83 years old: 11 mg per day.  63-62 years old: 8 mg per day.  Women.  59-23 years old: 15 mg per day.  80-20 years old: 18 mg per day.  Over 15 years old: 8 mg per day.  Pregnant women: 27 mg per day.  Breastfeeding  women: 9 mg per day. What do I need to know about an iron-rich diet?  Eat fresh fruits and vegetables that are high in vitamin C along with foods that are high in iron. This will help increase the amount of iron that your body absorbs from food, especially with foods containing nonheme iron. Foods that are high in vitamin C include oranges, peppers, tomatoes, and mango.  Take iron supplements only as directed by your health care provider. Overdose of iron can be life-threatening. If you were prescribed iron supplements, take them with orange juice or a vitamin C supplement.  Cook foods in pots and pans that are made from iron.  Eat nonheme iron-containing foods alongside foods that are high in heme iron. This helps to improve your iron absorption.  Certain foods and drinks contain compounds that impair iron absorption. Avoid eating these foods in the same meal as iron-rich foods or with iron supplements. These include:  Coffee, black tea, and red wine.  Milk, dairy products, and foods that are high in calcium.  Beans, soybeans, and peas.  Whole grains.  When eating foods that contain both nonheme iron and compounds that impair iron absorption, follow these tips to absorb iron better.  Soak beans overnight before cooking.  Soak whole grains overnight and drain them before using.  Ferment flours before baking, such as using yeast in bread dough. What foods can  I eat? Grains  Iron-fortified breakfast cereal. Iron-fortified whole-wheat bread. Enriched rice. Sprouted grains. Vegetables  Spinach. Potatoes with skin. Green peas. Broccoli. Red and green bell peppers. Fermented vegetables. Fruits  Prunes. Raisins. Oranges. Strawberries. Mango. Grapefruit. Meats and Other Protein Sources  Beef liver. Oysters. Beef. Shrimp. [place]. Chicken. [place]. Sardines. Chickpeas. Nuts. Tofu. Beverages  Tomato juice. Fresh orange juice. Prune juice. Hibiscus tea. Fortified instant breakfast  shakes. Condiments  Tahini. Fermented soy sauce. Sweets and Desserts  Black-strap molasses. Other  Wheat germ. The items listed above may not be a complete list of recommended foods or beverages. Contact your dietitian for more options.  What foods are not recommended? Grains  Whole grains. Bran cereal. Bran flour. Oats. Vegetables  Artichokes. Brussels sprouts. Kale. Fruits  Blueberries. Raspberries. Strawberries. Figs. Meats and Other Protein Sources  Soybeans. Products made from soy protein. Dairy  Milk. Cream. Cheese. Yogurt. Cottage cheese. Beverages  Coffee. Black tea. Red wine. Sweets and Desserts  Cocoa. Chocolate. Ice cream. Other  Basil. Oregano. Parsley. The items listed above may not be a complete list of foods and beverages to avoid. Contact your dietitian for more information.  This information is not intended to replace advice given to you by your health care provider. Make sure you discuss any questions you have with your health care provider. Document Released: 08/21/2004 Document Revised: 07/28/2015 Document Reviewed: 08/04/2013 Elsevier Interactive Patient Education  2017 [employer].

## 2016-05-30 NOTE — Progress Notes (Signed)
Primary Care Physician: Mikey Kirschner, MD  Primary Gastroenterologist:  Garfield Cornea, MD   Chief Complaint  Patient presents with  . Anemia    hosp f/u, doing ok    HPI: [name] is a 76 y.o. female here for hospital follow-up. She has a history of stage IIIa vulvar cancer admitted with anemia and melena/heme positive stool back in mid April. Hemoglobin was 6.5. No history of NSAID use aside from a baby aspirin daily. Received one unit of prbcs during admission.   Underwent Givens capsule study on 05/06/2016 for anemia, melena, heme positive stool with normal EGD and colonoscopy (same admission). Possible old blood seen towards the distal small bowel but significant debris may difficulty to identify. No obvious culprit for melena.  Energy level much better. Iron once per day. BM regular. No melena, brbpr. Will start XRT next week for 5 weeks. No abd pain. No heartburn, vomiting. Appetite ok.    Current Outpatient Prescriptions  Medication Sig Dispense Refill  . aspirin 81 MG tablet Take 81 mg by mouth daily.    Marland Kitchen CINNAMON PO Take 2 capsules by mouth daily. 2,000 mg daily     . ferrous sulfate 325 (65 FE) MG tablet Take 325 mg by mouth daily with breakfast.    . furosemide (LASIX) 40 MG tablet TAKE ONE TABLET BY MOUTH ONCE DAILY. 90 tablet 1  . gabapentin (NEURONTIN) 100 MG capsule TAKE 1 CAPSULE BY MOUTH THREE TIMES A DAY. 270 capsule 0  . HYDROcodone-acetaminophen (NORCO) 5-325 MG tablet Take 1 tablet by mouth every 6 (six) hours as needed for moderate pain. 36 tablet 0  . losartan (COZAAR) 50 MG tablet TAKE ONE TABLET BY MOUTH ONCE DAILY. 90 tablet 1  . metFORMIN (GLUCOPHAGE) 500 MG tablet TAKE (1) TABLET BY MOUTH TWICE DAILY. 180 tablet 0  . Multiple Vitamins-Minerals (MULTIVITAMIN ADULT PO) Take by mouth daily.     . potassium chloride SA (K-DUR,KLOR-CON) 20 MEQ tablet Take 1 tablet (20 mEq total) by mouth daily. 90 tablet 3  . Multiple Minerals-Vitamins  (CALCIUM-MAGNESIUM-ZINC-D3 PO) Take 2 tablets by mouth 2 (two) times daily.     No current facility-administered medications for this visit.     Allergies as of 05/30/2016 - Review Complete 05/30/2016  Allergen Reaction Noted  . Celebrex [celecoxib]  10/26/2014  . Doxycycline Nausea Only 05/21/2012  . Iron  05/29/2016  . Lodine [etodolac] Nausea Only 05/21/2012  . Nabumetone    . Tetracycline    . Xarelto [rivaroxaban] Hives 10/26/2014    ROS:  General: Negative for anorexia, weight loss, fever, chills, weakness.see hpi ENT: Negative for hoarseness, difficulty swallowing , nasal congestion. CV: Negative for chest pain, angina, palpitations, dyspnea on exertion, peripheral edema.  Respiratory: Negative for dyspnea at rest, dyspnea on exertion, cough, sputum, wheezing.  GI: See history of present illness. GU:  Negative for dysuria, hematuria, urinary incontinence, urinary frequency, nocturnal urination.  Endo: Negative for unusual weight change.    Physical Examination:   BP 123/75   Pulse 85   Temp 97.8 F (36.6 C) (Oral)   Ht 5\' 4"  (1.626 m)   Wt 192 lb 3.2 oz (87.2 kg)   BMI 32.99 kg/m   General: Well-nourished, well-developed in no acute distress. pale Eyes: No icterus. Mouth: Oropharyngeal mucosa moist and pink , no lesions erythema or exudate. Lungs: Clear to auscultation bilaterally.  Heart: Regular rate and rhythm, no murmurs rubs or gallops.  Abdomen: Bowel sounds are  normal, nontender, nondistended, no hepatosplenomegaly or masses, no abdominal bruits or hernia , no rebound or guarding.   Extremities: No lower extremity edema. No clubbing or deformities. Neuro: Alert and oriented x 4   Skin: Warm and dry, no jaundice.   Psych: Alert and cooperative, normal mood and affect.  Labs:  Lab Results  Component Value Date   WBC 7.2 05/23/2016   HGB 8.8 (L) 05/23/2016   HCT 29.0 (L) 05/23/2016   MCV 75 (L) 05/23/2016   PLT 319 05/23/2016   Lab Results    Component Value Date   CREATININE 0.77 05/23/2016   BUN 15 05/23/2016   NA 144 05/23/2016   K 4.3 05/23/2016   CL 101 05/23/2016   CO2 26 05/23/2016   Lab Results  Component Value Date   ALT 20 05/03/2016   AST 24 05/03/2016   ALKPHOS 63 05/03/2016   BILITOT 0.4 05/03/2016   Lab Results  Component Value Date   FERRITIN 11 (L) 05/23/2016     Imaging Studies: Dg Chest 2 View  Result Date: 05/03/2016 CLINICAL DATA:  Shortness of breath for 2-3 days EXAM: CHEST  2 VIEW COMPARISON:  Chest CT 04/01/2016 FINDINGS: The previously seen small bilateral pulmonary nodules cannot be appreciated by plain film. Heart is normal size. Lungs are clear. No effusions or acute bony abnormality. IMPRESSION: No active cardiopulmonary disease. Unable to visualize the previously seen small bilateral pulmonary nodules. These should be followed with CT. Electronically Signed   By: Rolm Baptise M.D.   On: 05/03/2016 11:54   Ct Angio Chest Pe W/cm &/or Wo Cm  Result Date: 05/03/2016 CLINICAL DATA:  76 year old female with shortness of breath for 2 days. Recently diagnosed and treated for ball vulva cancer in January and February. EXAM: CT ANGIOGRAPHY CHEST WITH CONTRAST TECHNIQUE: Multidetector CT imaging of the chest was performed using the standard protocol during bolus administration of intravenous contrast. Multiplanar CT image reconstructions and MIPs were obtained to evaluate the vascular anatomy. CONTRAST:  100 mL Isovue 370 COMPARISON:  Chest CT 04/01/2016. FINDINGS: Cardiovascular: Good contrast bolus timing in the pulmonary arterial tree. No focal filling defect identified in the pulmonary arteries to suggest acute pulmonary embolism. Cardiac size is at the upper limits of normal to mildly enlarged. No pericardial effusion. Negative visible aorta. There is evidence of left coronary artery atherosclerosis. Mediastinum/Nodes: Stable hypodense right thyroid nodule. No mediastinal or hilar lymphadenopathy.  Lungs/Pleura: Lower lung volumes. Major airways are patent. Perihilar and dependent atelectasis. Stable small superior segment right lower lobe subpleural nodule measuring 8-9 mm on series 7, image 41 small calcified granuloma along the left superior major fissure incidentally re - identified. Stable small lateral left lower lobe subpleural nodule on series 7, image 47. No pleural effusion.  No confluent or suspicious pulmonary opacity. Upper Abdomen: Negative visualized liver, spleen, pancreas, adrenal glands, kidneys, and bowel in the upper abdomen. Musculoskeletal: Disc and endplate degeneration in the thoracic spine. No acute or suspicious osseous lesion. Review of the MIP images confirms the above findings. IMPRESSION: 1.  No evidence of acute pulmonary embolus. 2. Calcified coronary artery atherosclerosis. Negative visible aorta. 3. Pulmonary atelectasis with no other acute pulmonary process. The small subpleural pulmonary nodules seen by CT last month are stable. Electronically Signed   By: Genevie Ann M.D.   On: 05/03/2016 14:36

## 2016-05-31 ENCOUNTER — Encounter: Payer: Self-pay | Admitting: Gastroenterology

## 2016-05-31 NOTE — Assessment & Plan Note (Signed)
Recent hospitalization with melena/heme + stool, Hgb in six range. EGD/TCS unremarkable. Givens with ?old blood towards distal SB but due to significant debris interpretation somewhat limited, no obvious culprit for melena. Follow up on pending labs in the next few weeks. If overt GI bleeding, she will let us know. Further recommendations to follow based on labs.

## 2016-06-03 NOTE — Progress Notes (Signed)
cc'ed to pcp °

## 2016-06-04 DIAGNOSIS — C519 Malignant neoplasm of vulva, unspecified: Secondary | ICD-10-CM | POA: Diagnosis not present

## 2016-06-04 DIAGNOSIS — Z510 Encounter for antineoplastic radiation therapy: Secondary | ICD-10-CM | POA: Diagnosis not present

## 2016-06-05 ENCOUNTER — Ambulatory Visit: Payer: PPO | Admitting: Radiation Oncology

## 2016-06-05 ENCOUNTER — Ambulatory Visit
Admission: RE | Admit: 2016-06-05 | Discharge: 2016-06-05 | Disposition: A | Discharge status: Home / Self Care | Payer: PPO | Source: Ambulatory Visit | Attending: Radiation Oncology | Admitting: Radiation Oncology

## 2016-06-05 DIAGNOSIS — C519 Malignant neoplasm of vulva, unspecified: Secondary | ICD-10-CM

## 2016-06-05 DIAGNOSIS — Z510 Encounter for antineoplastic radiation therapy: Secondary | ICD-10-CM | POA: Diagnosis not present

## 2016-06-05 NOTE — Progress Notes (Signed)
  Radiation Oncology         [phone] ________________________________  Name: [name] MRN: [id number]  Date: 06/05/2016  DOB: 05/07/1940  Simulation Verification Note    ICD-9-CM ICD-10-CM   1. Vulva cancer ([place]) 184.4 C51.9     Status: outpatient  NARRATIVE: The patient was brought to the treatment unit and placed in the planned treatment position. The clinical setup was verified. Then port films were obtained and uploaded to the radiation oncology medical record software.  The treatment beams were carefully compared against the planned radiation fields. The position location and shape of the radiation fields was reviewed. They targeted volume of tissue appears to be appropriately covered by the radiation beams. Organs at risk appear to be excluded as planned.  Based on my personal review, I approved the simulation verification. The patient's treatment will proceed as planned.  -----------------------------------  Blair Promise, PhD, MD

## 2016-06-06 ENCOUNTER — Ambulatory Visit: Payer: PPO

## 2016-06-06 ENCOUNTER — Ambulatory Visit
Admission: RE | Admit: 2016-06-06 | Discharge: 2016-06-06 | Disposition: A | Discharge status: Home / Self Care | Payer: PPO | Source: Ambulatory Visit | Attending: Radiation Oncology | Admitting: Radiation Oncology

## 2016-06-06 DIAGNOSIS — C519 Malignant neoplasm of vulva, unspecified: Secondary | ICD-10-CM | POA: Diagnosis not present

## 2016-06-06 DIAGNOSIS — Z510 Encounter for antineoplastic radiation therapy: Secondary | ICD-10-CM | POA: Diagnosis not present

## 2016-06-07 ENCOUNTER — Ambulatory Visit: Payer: PPO

## 2016-06-07 ENCOUNTER — Ambulatory Visit
Admission: RE | Admit: 2016-06-07 | Discharge: 2016-06-07 | Disposition: A | Discharge status: Home / Self Care | Payer: PPO | Source: Ambulatory Visit | Attending: Radiation Oncology | Admitting: Radiation Oncology

## 2016-06-07 DIAGNOSIS — C519 Malignant neoplasm of vulva, unspecified: Secondary | ICD-10-CM | POA: Diagnosis not present

## 2016-06-07 DIAGNOSIS — Z510 Encounter for antineoplastic radiation therapy: Secondary | ICD-10-CM | POA: Diagnosis not present

## 2016-06-10 ENCOUNTER — Ambulatory Visit: Payer: PPO

## 2016-06-10 ENCOUNTER — Ambulatory Visit
Admission: RE | Admit: 2016-06-10 | Discharge: 2016-06-10 | Disposition: A | Discharge status: Home / Self Care | Payer: PPO | Source: Ambulatory Visit | Attending: Radiation Oncology | Admitting: Radiation Oncology

## 2016-06-10 DIAGNOSIS — Z510 Encounter for antineoplastic radiation therapy: Secondary | ICD-10-CM | POA: Diagnosis not present

## 2016-06-10 DIAGNOSIS — C519 Malignant neoplasm of vulva, unspecified: Secondary | ICD-10-CM | POA: Diagnosis not present

## 2016-06-11 ENCOUNTER — Ambulatory Visit: Payer: PPO

## 2016-06-11 ENCOUNTER — Ambulatory Visit
Admission: RE | Admit: 2016-06-11 | Discharge: 2016-06-11 | Disposition: A | Discharge status: Home / Self Care | Payer: PPO | Source: Ambulatory Visit | Attending: Radiation Oncology | Admitting: Radiation Oncology

## 2016-06-11 ENCOUNTER — Encounter: Payer: Self-pay | Admitting: Radiation Oncology

## 2016-06-11 VITALS — BP 133/74 | HR 85 | Temp 98.1°F | Ht 64.0 in | Wt 191.4 lb

## 2016-06-11 DIAGNOSIS — Z808 Family history of malignant neoplasm of other organs or systems: Secondary | ICD-10-CM | POA: Diagnosis not present

## 2016-06-11 DIAGNOSIS — Z8249 Family history of ischemic heart disease and other diseases of the circulatory system: Secondary | ICD-10-CM | POA: Diagnosis not present

## 2016-06-11 DIAGNOSIS — Z888 Allergy status to other drugs, medicaments and biological substances status: Secondary | ICD-10-CM | POA: Insufficient documentation

## 2016-06-11 DIAGNOSIS — Z90710 Acquired absence of both cervix and uterus: Secondary | ICD-10-CM | POA: Diagnosis not present

## 2016-06-11 DIAGNOSIS — I10 Essential (primary) hypertension: Secondary | ICD-10-CM | POA: Diagnosis not present

## 2016-06-11 DIAGNOSIS — Z96651 Presence of right artificial knee joint: Secondary | ICD-10-CM | POA: Insufficient documentation

## 2016-06-11 DIAGNOSIS — C519 Malignant neoplasm of vulva, unspecified: Secondary | ICD-10-CM

## 2016-06-11 DIAGNOSIS — M1990 Unspecified osteoarthritis, unspecified site: Secondary | ICD-10-CM | POA: Diagnosis not present

## 2016-06-11 DIAGNOSIS — Z510 Encounter for antineoplastic radiation therapy: Secondary | ICD-10-CM | POA: Insufficient documentation

## 2016-06-11 DIAGNOSIS — E559 Vitamin D deficiency, unspecified: Secondary | ICD-10-CM | POA: Insufficient documentation

## 2016-06-11 DIAGNOSIS — D509 Iron deficiency anemia, unspecified: Secondary | ICD-10-CM | POA: Diagnosis not present

## 2016-06-11 DIAGNOSIS — Z7984 Long term (current) use of oral hypoglycemic drugs: Secondary | ICD-10-CM | POA: Diagnosis not present

## 2016-06-11 DIAGNOSIS — I350 Nonrheumatic aortic (valve) stenosis: Secondary | ICD-10-CM | POA: Diagnosis not present

## 2016-06-11 DIAGNOSIS — Z96642 Presence of left artificial hip joint: Secondary | ICD-10-CM | POA: Insufficient documentation

## 2016-06-11 DIAGNOSIS — Z7982 Long term (current) use of aspirin: Secondary | ICD-10-CM | POA: Diagnosis not present

## 2016-06-11 DIAGNOSIS — Z881 Allergy status to other antibiotic agents status: Secondary | ICD-10-CM | POA: Diagnosis not present

## 2016-06-11 DIAGNOSIS — Z79899 Other long term (current) drug therapy: Secondary | ICD-10-CM | POA: Diagnosis not present

## 2016-06-11 DIAGNOSIS — E119 Type 2 diabetes mellitus without complications: Secondary | ICD-10-CM | POA: Diagnosis not present

## 2016-06-11 NOTE — Progress Notes (Signed)
  Radiation Oncology         [phone] ________________________________  Name: [name] MRN: [id number]  Date: 06/11/2016  DOB: 03/16/1940  Weekly Radiation Therapy Management    ICD-9-CM ICD-10-CM   1. Vulva cancer ([place]) 184.4 C51.9      Current Dose: 9 Gy     Planned Dose:  63 Gy  Narrative . . . . . . . . The patient presents for routine under treatment assessment.                                   [name] has completed 5 fractions to her pelvis.  She denies having any pain today.  She reports having urinary frequency which she thinks is due to taking lasix.  She denies having nausea, diarrhea or vaginal bleeding/discharge.  She denies having any increase in fatigue.                                 Set-up films were reviewed.                                 The chart was checked. Physical Findings. . .  height is 5\' 4"  (1.626 m) and weight is 191 lb 6.4 oz (86.8 kg). Her oral temperature is 98.1 F (36.7 C). Her blood pressure is 133/74 and her pulse is 85. Her oxygen saturation is 100%. . The lungs are clear. The heart has a regular rhythm and rate. The abdomen is soft and nontender with normal bowel sounds. Impression . . . . . . . The patient is tolerating radiation. Plan . . . . . . . . . . . . Continue treatment as planned.  ________________________________   Blair Promise, PhD, MD

## 2016-06-11 NOTE — Progress Notes (Signed)
Shanigua has completed 5 fractions to her pelvis.  She denies having any pain today.  She reports having urinary frequency which she thinks is due to taking lasix.  She denies having nausea, diarrhea or vaginal bleeding/discharge.  She denies having any increase in fatigue.  BP 133/74 (BP Location: Right Arm, Patient Position: Sitting)   Pulse 85   Temp 98.1 F (36.7 C) (Oral)   Ht 5\' 4"  (1.626 m)   Wt 191 lb 6.4 oz (86.8 kg)   SpO2 100%   BMI 32.85 kg/m    Wt Readings from Last 3 Encounters:  06/11/16 191 lb 6.4 oz (86.8 kg)  05/30/16 192 lb 3.2 oz (87.2 kg)  05/29/16 193 lb 3.2 oz (87.6 kg)

## 2016-06-11 NOTE — Progress Notes (Signed)
Pt here for patient teaching.  Pt given Radiation and You booklet.  Reviewed areas of pertinence such as diarrhea, fatigue, nausea and vomiting, sexual and fertility changes, skin changes and urinary and bladder changes . Pt able to give teach back of to pat skin, use unscented/gentle soap, have Imodium on hand and drink plenty of water,avoid applying anything to skin within 4 hours of treatment. Pt demonstrated understanding and verbalizes understanding of information given and will contact nursing with any questions or concerns.          

## 2016-06-12 ENCOUNTER — Ambulatory Visit: Payer: PPO

## 2016-06-12 ENCOUNTER — Ambulatory Visit
Admission: RE | Admit: 2016-06-12 | Discharge: 2016-06-12 | Disposition: A | Discharge status: Home / Self Care | Payer: PPO | Source: Ambulatory Visit | Attending: Radiation Oncology | Admitting: Radiation Oncology

## 2016-06-12 DIAGNOSIS — Z510 Encounter for antineoplastic radiation therapy: Secondary | ICD-10-CM | POA: Diagnosis not present

## 2016-06-12 DIAGNOSIS — C519 Malignant neoplasm of vulva, unspecified: Secondary | ICD-10-CM | POA: Diagnosis not present

## 2016-06-13 ENCOUNTER — Ambulatory Visit: Payer: PPO

## 2016-06-13 ENCOUNTER — Ambulatory Visit
Admission: RE | Admit: 2016-06-13 | Discharge: 2016-06-13 | Disposition: A | Discharge status: Home / Self Care | Payer: PPO | Source: Ambulatory Visit | Attending: Radiation Oncology | Admitting: Radiation Oncology

## 2016-06-13 DIAGNOSIS — C519 Malignant neoplasm of vulva, unspecified: Secondary | ICD-10-CM | POA: Diagnosis not present

## 2016-06-13 DIAGNOSIS — Z510 Encounter for antineoplastic radiation therapy: Secondary | ICD-10-CM | POA: Diagnosis not present

## 2016-06-14 ENCOUNTER — Ambulatory Visit
Admission: RE | Admit: 2016-06-14 | Discharge: 2016-06-14 | Disposition: A | Discharge status: Home / Self Care | Payer: PPO | Source: Ambulatory Visit | Attending: Radiation Oncology | Admitting: Radiation Oncology

## 2016-06-14 ENCOUNTER — Ambulatory Visit: Payer: PPO

## 2016-06-14 DIAGNOSIS — C519 Malignant neoplasm of vulva, unspecified: Secondary | ICD-10-CM | POA: Diagnosis not present

## 2016-06-14 DIAGNOSIS — Z510 Encounter for antineoplastic radiation therapy: Secondary | ICD-10-CM | POA: Diagnosis not present

## 2016-06-18 ENCOUNTER — Ambulatory Visit
Admission: RE | Admit: 2016-06-18 | Discharge: 2016-06-18 | Disposition: A | Discharge status: Home / Self Care | Payer: PPO | Source: Ambulatory Visit | Attending: Radiation Oncology | Admitting: Radiation Oncology

## 2016-06-18 ENCOUNTER — Ambulatory Visit: Payer: PPO

## 2016-06-18 VITALS — BP 116/74 | HR 90 | Temp 98.3°F | Resp 20 | Wt 192.2 lb

## 2016-06-18 DIAGNOSIS — C519 Malignant neoplasm of vulva, unspecified: Secondary | ICD-10-CM

## 2016-06-18 DIAGNOSIS — Z510 Encounter for antineoplastic radiation therapy: Secondary | ICD-10-CM | POA: Diagnosis not present

## 2016-06-18 NOTE — Progress Notes (Signed)
  Radiation Oncology         [phone] ________________________________  Name: [name] MRN: [id number]  Date: 06/18/2016  DOB: [birth year]  Weekly Radiation Therapy Management    ICD-9-CM ICD-10-CM   1. Vulva cancer ([place]) 184.4 C51.9      Current Dose: 16.2 Gy     Planned Dose:  63 Gy  Narrative . . . . . . . . The patient presents for routine under treatment assessment.                                  [name] is here today for 9th fraction of radiation to pelvis.  Patent reports having some pain on her labia only when urinating and subsides once the urine is cleaned.  Patient is unsure if the skin is red or inflamed, instructed patient to start to monitor that area for any skin breakdown and to let us know if any were to occur.  Patient reports no changes in appetite, or bowel habits.  Patient reports she no blood in urine/stool or vaginal bleeding.  Patient reports that her energy level has dropped some during treatment.                                  Set-up films were reviewed.                                 The chart was checked. Physical Findings. . .  weight is 192 lb 3.2 oz (87.2 kg). Her oral temperature is 98.3 F (36.8 C). Her blood pressure is 116/74 and her pulse is 90. Her respiration is 20 and oxygen saturation is 100%. . Weight essentially stable.  No significant changes.  The lungs are clear. The heart has a regular rhythm and rate. The abdomen is soft and nontender with normal bowel sounds. Impression . . . . . . . The patient is tolerating radiation. Plan . . . . . . . . . . . . Continue treatment as planned.  ________________________________   Blair Promise, PhD, MD

## 2016-06-18 NOTE — Progress Notes (Signed)
[name] is here today for 9th fraction of radiation to pelvis.  Patent reports having some pain on her labia only when urinating and subsides once the urine is cleaned.  Patient is unsure if the skin is red or inflamed, instructed patient to start to monitor that area for any skin breakdown and to let us know if any were to occur.  Patient reports no changes in appetite, or bowel habits.  Patient reports she no blood in urine/stool or vaginal bleeding.  Patient reports that her energy level has dropped some during treatment.   Vitals:   06/18/16 1456  BP: 116/74  Pulse: 90  Resp: 20  Temp: 98.3 F (36.8 C)  TempSrc: Oral  SpO2: 100%  Weight: 192 lb 3.2 oz (87.2 kg)   Wt Readings from Last 3 Encounters:  06/18/16 192 lb 3.2 oz (87.2 kg)  06/11/16 191 lb 6.4 oz (86.8 kg)  05/30/16 192 lb 3.2 oz (87.2 kg)

## 2016-06-19 ENCOUNTER — Ambulatory Visit: Payer: PPO

## 2016-06-19 ENCOUNTER — Ambulatory Visit
Admission: RE | Admit: 2016-06-19 | Discharge: 2016-06-19 | Disposition: A | Discharge status: Home / Self Care | Payer: PPO | Source: Ambulatory Visit | Attending: Radiation Oncology | Admitting: Radiation Oncology

## 2016-06-19 DIAGNOSIS — C519 Malignant neoplasm of vulva, unspecified: Secondary | ICD-10-CM | POA: Diagnosis not present

## 2016-06-19 DIAGNOSIS — Z510 Encounter for antineoplastic radiation therapy: Secondary | ICD-10-CM | POA: Diagnosis not present

## 2016-06-20 ENCOUNTER — Ambulatory Visit: Payer: PPO

## 2016-06-20 ENCOUNTER — Ambulatory Visit
Admission: RE | Admit: 2016-06-20 | Discharge: 2016-06-20 | Disposition: A | Discharge status: Home / Self Care | Payer: PPO | Source: Ambulatory Visit | Attending: Radiation Oncology | Admitting: Radiation Oncology

## 2016-06-20 DIAGNOSIS — C519 Malignant neoplasm of vulva, unspecified: Secondary | ICD-10-CM | POA: Diagnosis not present

## 2016-06-20 DIAGNOSIS — Z510 Encounter for antineoplastic radiation therapy: Secondary | ICD-10-CM | POA: Diagnosis not present

## 2016-06-21 ENCOUNTER — Ambulatory Visit: Payer: PPO

## 2016-06-21 ENCOUNTER — Ambulatory Visit
Admission: RE | Admit: 2016-06-21 | Discharge: 2016-06-21 | Disposition: A | Discharge status: Home / Self Care | Payer: PPO | Source: Ambulatory Visit | Attending: Radiation Oncology | Admitting: Radiation Oncology

## 2016-06-21 DIAGNOSIS — C519 Malignant neoplasm of vulva, unspecified: Secondary | ICD-10-CM | POA: Diagnosis not present

## 2016-06-21 DIAGNOSIS — Z510 Encounter for antineoplastic radiation therapy: Secondary | ICD-10-CM | POA: Diagnosis not present

## 2016-06-21 NOTE — Addendum Note (Signed)
Addendum  created 06/21/16 1111 by Effie Berkshire, MD   Sign clinical note

## 2016-06-24 ENCOUNTER — Ambulatory Visit
Admission: RE | Admit: 2016-06-24 | Discharge: 2016-06-24 | Disposition: A | Discharge status: Home / Self Care | Payer: PPO | Source: Ambulatory Visit | Attending: Radiation Oncology | Admitting: Radiation Oncology

## 2016-06-24 ENCOUNTER — Ambulatory Visit: Payer: PPO

## 2016-06-24 DIAGNOSIS — Z510 Encounter for antineoplastic radiation therapy: Secondary | ICD-10-CM | POA: Diagnosis not present

## 2016-06-24 DIAGNOSIS — C519 Malignant neoplasm of vulva, unspecified: Secondary | ICD-10-CM | POA: Diagnosis not present

## 2016-06-25 ENCOUNTER — Ambulatory Visit
Admission: RE | Admit: 2016-06-25 | Discharge: 2016-06-25 | Disposition: A | Discharge status: Home / Self Care | Payer: PPO | Source: Ambulatory Visit | Attending: Radiation Oncology | Admitting: Radiation Oncology

## 2016-06-25 ENCOUNTER — Ambulatory Visit: Payer: PPO

## 2016-06-25 ENCOUNTER — Encounter: Payer: Self-pay | Admitting: Radiation Oncology

## 2016-06-25 VITALS — BP 124/80 | HR 92 | Temp 98.2°F | Ht 64.0 in | Wt 192.0 lb

## 2016-06-25 DIAGNOSIS — C519 Malignant neoplasm of vulva, unspecified: Secondary | ICD-10-CM | POA: Diagnosis not present

## 2016-06-25 DIAGNOSIS — Z510 Encounter for antineoplastic radiation therapy: Secondary | ICD-10-CM | POA: Diagnosis not present

## 2016-06-25 NOTE — Progress Notes (Signed)
  Radiation Oncology         [phone] ________________________________  Name: [name] MRN: [id number]  Date: 06/25/2016  DOB: August 10, 1940  Weekly Radiation Therapy Management    ICD-9-CM ICD-10-CM   1. Vulva cancer ([place]) 184.4 C51.9      Current Dose: 25.2 Gy     Planned Dose:  63 Gy  Narrative . . . . . . . . The patient presents for routine under treatment assessment.                                   [name] has completed 14 fractions to her pelvis.  She denies having any dysuria this week.  She has noticed some itching in her vaginal area when she wipes after urinating.  She reports having a small amount of pink vaginal discharge.  She reports having 4-5 loose bowel movements yesterday and took Imodium which helped.  She denies having nausea.  She reports having fatigue with acitivty.                                   Set-up films were reviewed.                                 The chart was checked. Physical Findings. . .  height is 5\' 4"  (1.626 m) and weight is 192 lb (87.1 kg). Her oral temperature is 98.2 F (36.8 C). Her blood pressure is 124/80 and her pulse is 92. Her oxygen saturation is 100%. . Weight essentially stable.  Lungs are clear to auscultation bilaterally. Heart has regular rate and rhythm. No palpable cervical, supraclavicular, or axillary adenopathy. Abdomen soft, non-tender, normal bowel sounds. Impression . . . . . . . The patient is tolerating radiation. Plan . . . . . . . . . . . . Continue treatment as planned.  ________________________________   Blair Promise, PhD, MD

## 2016-06-25 NOTE — Progress Notes (Addendum)
[name] has completed 14 fractions to her pelvis.  She denies having any dysuria this week.  She has noticed some itching in her vaginal area when she wipes after urinating.  She reports having a small amount of pink vaginal discharge.  She reports having 4-5 loose bowel movements yesterday and took Imodium which helped.  She denies having nausea.  She reports having fatigue with acitivty.  She mentioned that she takes a hydrocodone/acetaminophen which she thinks helps with her leg weakness.  BP 124/80 (BP Location: Left Arm, Patient Position: Sitting)   Pulse 92   Temp 98.2 F (36.8 C) (Oral)   Ht 5\' 4"  (1.626 m)   Wt 192 lb (87.1 kg)   SpO2 100%   BMI 32.96 kg/m    Wt Readings from Last 3 Encounters:  06/25/16 192 lb (87.1 kg)  06/18/16 192 lb 3.2 oz (87.2 kg)  06/11/16 191 lb 6.4 oz (86.8 kg)

## 2016-06-26 ENCOUNTER — Ambulatory Visit: Payer: PPO

## 2016-06-26 ENCOUNTER — Ambulatory Visit
Admission: RE | Admit: 2016-06-26 | Discharge: 2016-06-26 | Disposition: A | Discharge status: Home / Self Care | Payer: PPO | Source: Ambulatory Visit | Attending: Radiation Oncology | Admitting: Radiation Oncology

## 2016-06-26 DIAGNOSIS — Z510 Encounter for antineoplastic radiation therapy: Secondary | ICD-10-CM | POA: Diagnosis not present

## 2016-06-26 DIAGNOSIS — C519 Malignant neoplasm of vulva, unspecified: Secondary | ICD-10-CM | POA: Diagnosis not present

## 2016-06-27 ENCOUNTER — Ambulatory Visit: Payer: PPO

## 2016-06-28 ENCOUNTER — Other Ambulatory Visit: Payer: Self-pay | Admitting: Family Medicine

## 2016-06-28 ENCOUNTER — Ambulatory Visit
Admission: RE | Admit: 2016-06-28 | Discharge: 2016-06-28 | Disposition: A | Discharge status: Home / Self Care | Payer: PPO | Source: Ambulatory Visit | Attending: Radiation Oncology | Admitting: Radiation Oncology

## 2016-06-28 ENCOUNTER — Ambulatory Visit: Payer: PPO

## 2016-06-28 DIAGNOSIS — Z510 Encounter for antineoplastic radiation therapy: Secondary | ICD-10-CM | POA: Diagnosis not present

## 2016-06-28 DIAGNOSIS — C519 Malignant neoplasm of vulva, unspecified: Secondary | ICD-10-CM | POA: Diagnosis not present

## 2016-07-01 ENCOUNTER — Ambulatory Visit: Payer: PPO

## 2016-07-01 ENCOUNTER — Ambulatory Visit
Admission: RE | Admit: 2016-07-01 | Discharge: 2016-07-01 | Disposition: A | Discharge status: Home / Self Care | Payer: PPO | Source: Ambulatory Visit | Attending: Radiation Oncology | Admitting: Radiation Oncology

## 2016-07-01 DIAGNOSIS — C519 Malignant neoplasm of vulva, unspecified: Secondary | ICD-10-CM | POA: Diagnosis not present

## 2016-07-01 DIAGNOSIS — Z510 Encounter for antineoplastic radiation therapy: Secondary | ICD-10-CM | POA: Diagnosis not present

## 2016-07-02 ENCOUNTER — Ambulatory Visit: Payer: PPO

## 2016-07-02 ENCOUNTER — Ambulatory Visit: Payer: PPO | Admitting: Radiation Oncology

## 2016-07-02 ENCOUNTER — Ambulatory Visit
Admission: RE | Admit: 2016-07-02 | Discharge: 2016-07-02 | Disposition: A | Discharge status: Home / Self Care | Payer: PPO | Source: Ambulatory Visit | Attending: Radiation Oncology | Admitting: Radiation Oncology

## 2016-07-02 DIAGNOSIS — Z510 Encounter for antineoplastic radiation therapy: Secondary | ICD-10-CM | POA: Diagnosis not present

## 2016-07-02 DIAGNOSIS — C519 Malignant neoplasm of vulva, unspecified: Secondary | ICD-10-CM

## 2016-07-02 MED ORDER — BIAFINE EX EMUL
Freq: Once | CUTANEOUS | Status: AC
  Administered 2016-07-02: 11:00:00 via TOPICAL

## 2016-07-03 ENCOUNTER — Ambulatory Visit: Payer: PPO

## 2016-07-03 ENCOUNTER — Ambulatory Visit
Admission: RE | Admit: 2016-07-03 | Discharge: 2016-07-03 | Disposition: A | Discharge status: Home / Self Care | Payer: PPO | Source: Ambulatory Visit | Attending: Radiation Oncology | Admitting: Radiation Oncology

## 2016-07-03 DIAGNOSIS — C519 Malignant neoplasm of vulva, unspecified: Secondary | ICD-10-CM | POA: Diagnosis not present

## 2016-07-03 DIAGNOSIS — Z510 Encounter for antineoplastic radiation therapy: Secondary | ICD-10-CM | POA: Diagnosis not present

## 2016-07-04 ENCOUNTER — Ambulatory Visit
Admission: RE | Admit: 2016-07-04 | Discharge: 2016-07-04 | Disposition: A | Discharge status: Home / Self Care | Payer: PPO | Source: Ambulatory Visit | Attending: Radiation Oncology | Admitting: Radiation Oncology

## 2016-07-04 ENCOUNTER — Ambulatory Visit: Payer: PPO

## 2016-07-04 DIAGNOSIS — Z510 Encounter for antineoplastic radiation therapy: Secondary | ICD-10-CM | POA: Diagnosis not present

## 2016-07-04 DIAGNOSIS — C519 Malignant neoplasm of vulva, unspecified: Secondary | ICD-10-CM | POA: Diagnosis not present

## 2016-07-05 ENCOUNTER — Ambulatory Visit
Admission: RE | Admit: 2016-07-05 | Discharge: 2016-07-05 | Disposition: A | Discharge status: Home / Self Care | Payer: PPO | Source: Ambulatory Visit | Attending: Radiation Oncology | Admitting: Radiation Oncology

## 2016-07-05 ENCOUNTER — Ambulatory Visit: Payer: PPO

## 2016-07-05 DIAGNOSIS — C519 Malignant neoplasm of vulva, unspecified: Secondary | ICD-10-CM | POA: Diagnosis not present

## 2016-07-05 DIAGNOSIS — Z510 Encounter for antineoplastic radiation therapy: Secondary | ICD-10-CM | POA: Diagnosis not present

## 2016-07-08 ENCOUNTER — Ambulatory Visit: Payer: PPO

## 2016-07-08 ENCOUNTER — Ambulatory Visit
Admission: RE | Admit: 2016-07-08 | Discharge: 2016-07-08 | Disposition: A | Discharge status: Home / Self Care | Payer: PPO | Source: Ambulatory Visit | Attending: Radiation Oncology | Admitting: Radiation Oncology

## 2016-07-08 ENCOUNTER — Other Ambulatory Visit: Payer: Self-pay

## 2016-07-08 DIAGNOSIS — C519 Malignant neoplasm of vulva, unspecified: Secondary | ICD-10-CM | POA: Diagnosis not present

## 2016-07-08 DIAGNOSIS — Z510 Encounter for antineoplastic radiation therapy: Secondary | ICD-10-CM | POA: Diagnosis not present

## 2016-07-08 MED ORDER — HYDROCODONE-ACETAMINOPHEN 5-325 MG PO TABS: 1.0000 | ORAL_TABLET | Freq: Four times a day (QID) | ORAL | 0 refills | Status: DC | PRN

## 2016-07-09 ENCOUNTER — Ambulatory Visit
Admission: RE | Admit: 2016-07-09 | Discharge: 2016-07-09 | Disposition: A | Discharge status: Home / Self Care | Payer: PPO | Source: Ambulatory Visit | Attending: Radiation Oncology | Admitting: Radiation Oncology

## 2016-07-09 ENCOUNTER — Ambulatory Visit: Payer: PPO

## 2016-07-09 DIAGNOSIS — C519 Malignant neoplasm of vulva, unspecified: Secondary | ICD-10-CM

## 2016-07-09 DIAGNOSIS — Z510 Encounter for antineoplastic radiation therapy: Secondary | ICD-10-CM | POA: Diagnosis not present

## 2016-07-09 MED ORDER — SILVER SULFADIAZINE 1 % EX CREA
TOPICAL_CREAM | Freq: Two times a day (BID) | CUTANEOUS | Status: DC
  Administered 2016-07-09: 15:00:00 via TOPICAL

## 2016-07-10 ENCOUNTER — Ambulatory Visit: Payer: PPO

## 2016-07-10 ENCOUNTER — Ambulatory Visit
Admission: RE | Admit: 2016-07-10 | Discharge: 2016-07-10 | Disposition: A | Discharge status: Home / Self Care | Payer: PPO | Source: Ambulatory Visit | Attending: Radiation Oncology | Admitting: Radiation Oncology

## 2016-07-10 DIAGNOSIS — Z510 Encounter for antineoplastic radiation therapy: Secondary | ICD-10-CM | POA: Diagnosis not present

## 2016-07-10 DIAGNOSIS — C519 Malignant neoplasm of vulva, unspecified: Secondary | ICD-10-CM | POA: Diagnosis not present

## 2016-07-11 ENCOUNTER — Ambulatory Visit: Payer: PPO

## 2016-07-11 ENCOUNTER — Ambulatory Visit
Admission: RE | Admit: 2016-07-11 | Discharge: 2016-07-11 | Disposition: A | Discharge status: Home / Self Care | Payer: PPO | Source: Ambulatory Visit | Attending: Radiation Oncology | Admitting: Radiation Oncology

## 2016-07-11 DIAGNOSIS — C519 Malignant neoplasm of vulva, unspecified: Secondary | ICD-10-CM | POA: Diagnosis not present

## 2016-07-11 DIAGNOSIS — Z510 Encounter for antineoplastic radiation therapy: Secondary | ICD-10-CM | POA: Diagnosis not present

## 2016-07-12 ENCOUNTER — Ambulatory Visit
Admission: RE | Admit: 2016-07-12 | Discharge: 2016-07-12 | Disposition: A | Discharge status: Home / Self Care | Payer: PPO | Source: Ambulatory Visit | Attending: Radiation Oncology | Admitting: Radiation Oncology

## 2016-07-12 ENCOUNTER — Ambulatory Visit: Payer: PPO

## 2016-07-12 DIAGNOSIS — C519 Malignant neoplasm of vulva, unspecified: Secondary | ICD-10-CM | POA: Diagnosis not present

## 2016-07-12 DIAGNOSIS — Z510 Encounter for antineoplastic radiation therapy: Secondary | ICD-10-CM | POA: Diagnosis not present

## 2016-07-15 ENCOUNTER — Ambulatory Visit
Admission: RE | Admit: 2016-07-15 | Discharge: 2016-07-15 | Disposition: A | Discharge status: Home / Self Care | Payer: PPO | Source: Ambulatory Visit | Attending: Radiation Oncology | Admitting: Radiation Oncology

## 2016-07-15 ENCOUNTER — Ambulatory Visit: Payer: PPO

## 2016-07-15 DIAGNOSIS — Z510 Encounter for antineoplastic radiation therapy: Secondary | ICD-10-CM | POA: Diagnosis not present

## 2016-07-15 DIAGNOSIS — C519 Malignant neoplasm of vulva, unspecified: Secondary | ICD-10-CM | POA: Diagnosis not present

## 2016-07-16 ENCOUNTER — Ambulatory Visit
Admission: RE | Admit: 2016-07-16 | Discharge: 2016-07-16 | Disposition: A | Discharge status: Home / Self Care | Payer: PPO | Source: Ambulatory Visit | Attending: Radiation Oncology | Admitting: Radiation Oncology

## 2016-07-16 ENCOUNTER — Ambulatory Visit: Payer: PPO

## 2016-07-16 DIAGNOSIS — Z510 Encounter for antineoplastic radiation therapy: Secondary | ICD-10-CM | POA: Diagnosis not present

## 2016-07-16 DIAGNOSIS — C519 Malignant neoplasm of vulva, unspecified: Secondary | ICD-10-CM | POA: Diagnosis not present

## 2016-07-17 ENCOUNTER — Ambulatory Visit
Admission: RE | Admit: 2016-07-17 | Discharge: 2016-07-17 | Disposition: A | Discharge status: Home / Self Care | Payer: PPO | Source: Ambulatory Visit | Attending: Radiation Oncology | Admitting: Radiation Oncology

## 2016-07-17 ENCOUNTER — Ambulatory Visit: Payer: PPO

## 2016-07-17 DIAGNOSIS — C519 Malignant neoplasm of vulva, unspecified: Secondary | ICD-10-CM | POA: Diagnosis not present

## 2016-07-17 DIAGNOSIS — Z510 Encounter for antineoplastic radiation therapy: Secondary | ICD-10-CM | POA: Diagnosis not present

## 2016-07-18 ENCOUNTER — Ambulatory Visit: Payer: PPO

## 2016-07-18 ENCOUNTER — Ambulatory Visit
Admission: RE | Admit: 2016-07-18 | Discharge: 2016-07-18 | Disposition: A | Discharge status: Home / Self Care | Payer: PPO | Source: Ambulatory Visit | Attending: Radiation Oncology | Admitting: Radiation Oncology

## 2016-07-18 DIAGNOSIS — C519 Malignant neoplasm of vulva, unspecified: Secondary | ICD-10-CM | POA: Diagnosis not present

## 2016-07-18 DIAGNOSIS — Z510 Encounter for antineoplastic radiation therapy: Secondary | ICD-10-CM | POA: Diagnosis not present

## 2016-07-19 ENCOUNTER — Ambulatory Visit: Payer: PPO

## 2016-07-19 ENCOUNTER — Ambulatory Visit
Admission: RE | Admit: 2016-07-19 | Discharge: 2016-07-19 | Disposition: A | Discharge status: Home / Self Care | Payer: PPO | Source: Ambulatory Visit | Attending: Radiation Oncology | Admitting: Radiation Oncology

## 2016-07-19 DIAGNOSIS — C519 Malignant neoplasm of vulva, unspecified: Secondary | ICD-10-CM | POA: Diagnosis not present

## 2016-07-19 DIAGNOSIS — Z510 Encounter for antineoplastic radiation therapy: Secondary | ICD-10-CM | POA: Diagnosis not present

## 2016-07-21 DIAGNOSIS — C519 Malignant neoplasm of vulva, unspecified: Secondary | ICD-10-CM | POA: Diagnosis not present

## 2016-07-21 DIAGNOSIS — Z79899 Other long term (current) drug therapy: Secondary | ICD-10-CM | POA: Diagnosis not present

## 2016-07-21 DIAGNOSIS — I10 Essential (primary) hypertension: Secondary | ICD-10-CM | POA: Diagnosis not present

## 2016-07-21 DIAGNOSIS — M1990 Unspecified osteoarthritis, unspecified site: Secondary | ICD-10-CM | POA: Diagnosis not present

## 2016-07-21 DIAGNOSIS — Z8249 Family history of ischemic heart disease and other diseases of the circulatory system: Secondary | ICD-10-CM | POA: Diagnosis not present

## 2016-07-21 DIAGNOSIS — Z96642 Presence of left artificial hip joint: Secondary | ICD-10-CM | POA: Diagnosis not present

## 2016-07-21 DIAGNOSIS — Z96651 Presence of right artificial knee joint: Secondary | ICD-10-CM | POA: Diagnosis not present

## 2016-07-21 DIAGNOSIS — D509 Iron deficiency anemia, unspecified: Secondary | ICD-10-CM | POA: Diagnosis not present

## 2016-07-21 DIAGNOSIS — Z7984 Long term (current) use of oral hypoglycemic drugs: Secondary | ICD-10-CM | POA: Diagnosis not present

## 2016-07-21 DIAGNOSIS — I350 Nonrheumatic aortic (valve) stenosis: Secondary | ICD-10-CM | POA: Diagnosis not present

## 2016-07-21 DIAGNOSIS — E559 Vitamin D deficiency, unspecified: Secondary | ICD-10-CM | POA: Diagnosis not present

## 2016-07-21 DIAGNOSIS — E119 Type 2 diabetes mellitus without complications: Secondary | ICD-10-CM | POA: Diagnosis not present

## 2016-07-21 DIAGNOSIS — Z808 Family history of malignant neoplasm of other organs or systems: Secondary | ICD-10-CM | POA: Diagnosis not present

## 2016-07-21 DIAGNOSIS — Z881 Allergy status to other antibiotic agents status: Secondary | ICD-10-CM | POA: Diagnosis not present

## 2016-07-21 DIAGNOSIS — Z90710 Acquired absence of both cervix and uterus: Secondary | ICD-10-CM | POA: Diagnosis not present

## 2016-07-21 DIAGNOSIS — Z510 Encounter for antineoplastic radiation therapy: Secondary | ICD-10-CM | POA: Diagnosis not present

## 2016-07-21 DIAGNOSIS — Z7982 Long term (current) use of aspirin: Secondary | ICD-10-CM | POA: Diagnosis not present

## 2016-07-21 DIAGNOSIS — Z888 Allergy status to other drugs, medicaments and biological substances status: Secondary | ICD-10-CM | POA: Diagnosis not present

## 2016-07-22 ENCOUNTER — Ambulatory Visit: Payer: PPO

## 2016-07-22 ENCOUNTER — Ambulatory Visit
Admission: RE | Admit: 2016-07-22 | Discharge: 2016-07-22 | Disposition: A | Discharge status: Home / Self Care | Payer: PPO | Source: Ambulatory Visit | Attending: Radiation Oncology | Admitting: Radiation Oncology

## 2016-07-22 DIAGNOSIS — C519 Malignant neoplasm of vulva, unspecified: Secondary | ICD-10-CM | POA: Diagnosis not present

## 2016-07-22 DIAGNOSIS — Z510 Encounter for antineoplastic radiation therapy: Secondary | ICD-10-CM | POA: Diagnosis not present

## 2016-07-23 ENCOUNTER — Ambulatory Visit: Payer: PPO

## 2016-07-23 ENCOUNTER — Ambulatory Visit
Admission: RE | Admit: 2016-07-23 | Discharge: 2016-07-23 | Disposition: A | Discharge status: Home / Self Care | Payer: PPO | Source: Ambulatory Visit | Attending: Radiation Oncology | Admitting: Radiation Oncology

## 2016-07-23 DIAGNOSIS — C519 Malignant neoplasm of vulva, unspecified: Secondary | ICD-10-CM | POA: Diagnosis not present

## 2016-07-23 DIAGNOSIS — Z510 Encounter for antineoplastic radiation therapy: Secondary | ICD-10-CM | POA: Diagnosis not present

## 2016-07-23 MED ORDER — SILVER SULFADIAZINE 1 % EX CREA
TOPICAL_CREAM | Freq: Once | CUTANEOUS | Status: AC
  Administered 2016-07-23: 11:00:00 via TOPICAL

## 2016-07-24 ENCOUNTER — Ambulatory Visit: Payer: PPO

## 2016-07-25 ENCOUNTER — Ambulatory Visit
Admission: RE | Admit: 2016-07-25 | Discharge: 2016-07-25 | Disposition: A | Discharge status: Home / Self Care | Payer: PPO | Source: Ambulatory Visit | Attending: Radiation Oncology | Admitting: Radiation Oncology

## 2016-07-25 DIAGNOSIS — C519 Malignant neoplasm of vulva, unspecified: Secondary | ICD-10-CM | POA: Diagnosis not present

## 2016-07-25 DIAGNOSIS — Z510 Encounter for antineoplastic radiation therapy: Secondary | ICD-10-CM | POA: Diagnosis not present

## 2016-07-26 DIAGNOSIS — L609 Nail disorder, unspecified: Secondary | ICD-10-CM | POA: Diagnosis not present

## 2016-07-26 DIAGNOSIS — L851 Acquired keratosis [keratoderma] palmaris et plantaris: Secondary | ICD-10-CM | POA: Diagnosis not present

## 2016-07-26 DIAGNOSIS — E1342 Other specified diabetes mellitus with diabetic polyneuropathy: Secondary | ICD-10-CM | POA: Diagnosis not present

## 2016-07-31 ENCOUNTER — Encounter: Payer: Self-pay | Admitting: Radiation Oncology

## 2016-07-31 NOTE — Progress Notes (Signed)
  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  Beams/energy:   Pelvis:  IMRT  //  6X        Boost:  IMRT  //  6X  Narrative: The patient tolerated radiation treatment relatively well. She reported mild dysuria as well as loose stool 1-2 times daily. Overall, the patient was without complaint.  Plan: The patient has completed radiation treatment. The patient was provided with a sitz bath and instructions to soothe any irritation. The patient will return to radiation oncology clinic for routine followup in one month. I advised them to call or return sooner if they have any questions or concerns related to their recovery or treatment.  -----------------------------------  Blair Promise, PhD, MD  This document serves as a record of services personally performed by Gery Pray, MD. It was created on his behalf by Maryla Morrow, a trained medical scribe. The creation of this record is based on the scribe's personal observations and the provider's statements to them. This document has been checked and approved by the attending provider.

## 2016-08-07 ENCOUNTER — Encounter: Payer: Self-pay | Admitting: Family Medicine

## 2016-08-07 ENCOUNTER — Ambulatory Visit (INDEPENDENT_AMBULATORY_CARE_PROVIDER_SITE_OTHER): Payer: PPO | Admitting: Family Medicine

## 2016-08-07 VITALS — BP 138/84 | Ht 64.0 in | Wt 195.1 lb

## 2016-08-07 DIAGNOSIS — I10 Essential (primary) hypertension: Secondary | ICD-10-CM

## 2016-08-07 DIAGNOSIS — R7303 Prediabetes: Secondary | ICD-10-CM

## 2016-08-07 DIAGNOSIS — D649 Anemia, unspecified: Secondary | ICD-10-CM | POA: Diagnosis not present

## 2016-08-07 LAB — POCT GLYCOSYLATED HEMOGLOBIN (HGB A1C): Hemoglobin A1C: 5.1

## 2016-08-07 MED ORDER — HYDROCODONE-ACETAMINOPHEN 5-325 MG PO TABS: 1.0000 | ORAL_TABLET | Freq: Four times a day (QID) | ORAL | 0 refills | Status: DC | PRN

## 2016-08-07 NOTE — Progress Notes (Signed)
   Subjective:    Patient ID: [name], female    DOB: 1940-03-02, 76 y.o.   MRN: [id number]  HPI This patient was seen today for chronic pain  The medication list was reviewed and updated.   -Compliance with medication: Takes daily  - Number patient states they take daily: Takes 1-3 daily  -when was the last dose patient took? Last this morning The patient was advised the importance of maintaining medication and not using illegal substances with these.  Refills needed: yes   The patient was educated that we can provide 3 monthly scripts for their medication, it is their responsibility to follow the instructions.  Side effects or complications from medications: None  Patient is aware that pain medications are meant to minimize the severity of the pain to allow their pain levels to improve to allow for better function. They are aware of that pain medications cannot totally remove their pain.  Due for UDT ( at least once per year) : N/A  Patient states no concerns this visit.    fiishee thr treatments for the vulvar cancer Results for orders placed or performed in visit on 08/07/16  POCT HgB A1C  Result Value Ref Range   Hemoglobin A1C 5.1    Patient arrives office for numerous concerns  Blood pressure medicine and blood pressure levels reviewed today with patient. Compliant with blood pressure medicine. States does not miss a dose. No obvious side effects. Blood pressure generally good when checked elsewhere. Watching salt intake.  Patient had considerable anemia C office visit from the spring, was supposed to get a follow-up CBC. Did not do it.  Known history of prediabetes. Does not check her own sugars   Recently finished her treatment for vulvar carcinoma. Still has irritation blisters. Still has lingering pain. On further history patient noted that the hydrocodone also helped her arthritis. She would like to get on hydrocodone long-term  for her arthritis  pain  Review of Systems No headache, no major weight loss or weight gain, no chest pain no back pain abdominal pain no change in bowel habits complete ROS otherwise negative     Objective:   Physical Exam Alert and oriented, vitals reviewed and stable, NAD ENT-TM's and ext canals WNL bilat via otoscopic exam Soft palate, tonsils and post pharynx WNL via oropharyngeal exam Neck-symmetric, no masses; thyroid nonpalpable and nontender Pulmonary-no tachypnea or accessory muscle use; Clear without wheezes via auscultation Card--no abnrml murmurs, rhythm reg and rate WNL Carotid pulses symmetric, without bruits        Assessment & Plan:  Impression 1 prediabetes discussed at length A1c excellent at 5.1 #2 hypertension good control discussed maintain same #3 anemia status and certain to get CBC #4 chronic pain arthritis. Long discussion held. All experts now recommend not use narcotics for osteoarthritis. I will hold off on this. One further treatment for patient's residual symptoms from the cancer therapy. Encouraged follow-up for wellness with [name] this fall

## 2016-08-09 DIAGNOSIS — D649 Anemia, unspecified: Secondary | ICD-10-CM | POA: Diagnosis not present

## 2016-08-10 LAB — CBC WITH DIFFERENTIAL/PLATELET
Basophils Absolute: 0 10*3/uL (ref 0.0–0.2)
Basos: 0 %
EOS (ABSOLUTE): 0.1 10*3/uL (ref 0.0–0.4)
EOS: 2 %
HEMATOCRIT: 35.8 % (ref 34.0–46.6)
Hemoglobin: 11.1 g/dL (ref 11.1–15.9)
IMMATURE GRANULOCYTES: 0 %
Immature Grans (Abs): 0 10*3/uL (ref 0.0–0.1)
Lymphocytes Absolute: 0.6 10*3/uL — ABNORMAL LOW (ref 0.7–3.1)
Lymphs: 11 %
MCH: 24.6 pg — ABNORMAL LOW (ref 26.6–33.0)
MCHC: 31 g/dL — ABNORMAL LOW (ref 31.5–35.7)
MCV: 79 fL (ref 79–97)
MONOS ABS: 0.5 10*3/uL (ref 0.1–0.9)
Monocytes: 8 %
NEUTROS PCT: 79 %
Neutrophils Absolute: 4.3 10*3/uL (ref 1.4–7.0)
Platelets: 198 10*3/uL (ref 150–379)
RBC: 4.52 x10E6/uL (ref 3.77–5.28)
RDW: 16.6 % — AB (ref 12.3–15.4)
WBC: 5.5 10*3/uL (ref 3.4–10.8)

## 2016-08-21 DIAGNOSIS — Z961 Presence of intraocular lens: Secondary | ICD-10-CM | POA: Diagnosis not present

## 2016-08-21 DIAGNOSIS — H26491 Other secondary cataract, right eye: Secondary | ICD-10-CM | POA: Diagnosis not present

## 2016-08-21 DIAGNOSIS — H43812 Vitreous degeneration, left eye: Secondary | ICD-10-CM | POA: Diagnosis not present

## 2016-08-21 DIAGNOSIS — H17821 Peripheral opacity of cornea, right eye: Secondary | ICD-10-CM | POA: Diagnosis not present

## 2016-08-21 LAB — HM DIABETES EYE EXAM

## 2016-08-24 NOTE — Progress Notes (Signed)
Hgb continued to improve over the past several months. Hgb 11.1 normal on 07/2016.  Would recommend OV with RMR in 3 months.

## 2016-08-26 NOTE — Progress Notes (Signed)
ON RECALL  °

## 2016-08-28 ENCOUNTER — Encounter: Payer: Self-pay | Admitting: Oncology

## 2016-08-29 ENCOUNTER — Telehealth: Payer: Self-pay | Admitting: *Deleted

## 2016-08-29 ENCOUNTER — Ambulatory Visit
Admission: RE | Admit: 2016-08-29 | Discharge: 2016-08-29 | Disposition: A | Discharge status: Home / Self Care | Payer: PPO | Source: Ambulatory Visit | Attending: Radiation Oncology | Admitting: Radiation Oncology

## 2016-08-29 ENCOUNTER — Encounter: Payer: Self-pay | Admitting: Radiation Oncology

## 2016-08-29 DIAGNOSIS — Z7984 Long term (current) use of oral hypoglycemic drugs: Secondary | ICD-10-CM | POA: Insufficient documentation

## 2016-08-29 DIAGNOSIS — C519 Malignant neoplasm of vulva, unspecified: Secondary | ICD-10-CM

## 2016-08-29 DIAGNOSIS — Z7982 Long term (current) use of aspirin: Secondary | ICD-10-CM | POA: Diagnosis not present

## 2016-08-29 DIAGNOSIS — Z79899 Other long term (current) drug therapy: Secondary | ICD-10-CM | POA: Insufficient documentation

## 2016-08-29 NOTE — Telephone Encounter (Signed)
Per radiation scheduled patient for follow up appt in September. Appt made for September 10th at 2pm; arrive at 1:30pm. Radiation to contact the patient.

## 2016-08-29 NOTE — Progress Notes (Signed)
[name] is here for follow up.  She denies having any pain.  She reports having an occasional small amount of urinary incontinence.  She denies having any bowel issues or vaginal bleeding.  She reports her skin in the treatment area is back to normal.  She reports having some soreness in her vaginal area after sitting forward for long periods of time.  She reports having some burning with urination with this and then it will go away.  She reports having some fatigue.  BP 122/73 (BP Location: Left Arm, Patient Position: Sitting)   Pulse 81   Temp 98.8 F (37.1 C) (Oral)   Ht 5\' 4"  (1.626 m)   Wt 193 lb 12.8 oz (87.9 kg)   SpO2 98%   BMI 33.27 kg/m    Wt Readings from Last 3 Encounters:  08/29/16 193 lb 12.8 oz (87.9 kg)  08/07/16 195 lb 2 oz (88.5 kg)  06/25/16 192 lb (87.1 kg)

## 2016-08-29 NOTE — Progress Notes (Signed)
Radiation Oncology         [phone] ________________________________  Name: [name] MRN: [id number]  Date: 08/29/2016  DOB: 1940/11/17  Follow-Up Visit Note  CC: Mikey Kirschner, MD  Everitt Amber, MD    ICD-10-CM   1. 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 The patient then proceeded with a boost to the vulvar region the light of the positive surgical margin and continue to a dose of 63 gray.  Narrative:  The patient returns today for routine follow-up. Pt reports mild stress urinary incontinence, that she doesn't have to wear pads for. Pt reports intermittent decreased energy levels. Denies rectal bleeding, bowel issues, or decreased appetite.                       ALLERGIES:  is allergic to celebrex [celecoxib]; doxycycline; iron; lodine [etodolac]; nabumetone; tetracycline; and xarelto [rivaroxaban].  Meds: Current Outpatient Prescriptions  Medication Sig Dispense Refill  . aspirin 81 MG tablet Take 81 mg by mouth daily.    . ferrous sulfate 325 (65 FE) MG tablet Take 325 mg by mouth daily with breakfast.    . furosemide (LASIX) 40 MG tablet TAKE ONE TABLET BY MOUTH ONCE DAILY. 90 tablet 0  . gabapentin (NEURONTIN) 100 MG capsule TAKE 1 CAPSULE BY MOUTH THREE TIMES A DAY. 270 capsule 0  . losartan (COZAAR) 50 MG tablet TAKE ONE TABLET BY MOUTH ONCE DAILY. 90 tablet 0  . metFORMIN (GLUCOPHAGE) 500 MG tablet TAKE (1) TABLET BY MOUTH TWICE DAILY. 180 tablet 0  . Multiple Vitamins-Minerals (MULTIVITAMIN ADULT PO) Take by mouth daily.     . potassium chloride SA (K-DUR,KLOR-CON) 20 MEQ tablet Take 1 tablet (20 mEq total) by mouth daily. 90 tablet 3  . CINNAMON PO Take 2 capsules by mouth daily.  2,000 mg daily     . HYDROcodone-acetaminophen (NORCO) 5-325 MG tablet Take 1 tablet by mouth every 6 (six) hours as needed for moderate pain. (Patient not taking: Reported on 08/29/2016) 36 tablet 0   No current facility-administered medications for this encounter.     REVIEW OF SYSTEMS: A 10+ POINT REVIEW OF SYSTEMS WAS OBTAINED including neurology, dermatology, psychiatry, cardiac, respiratory, lymph, extremities, GI, GU, musculoskeletal, constitutional, reproductive, HEENT. All pertinent positives are noted in the HPI. All others are negative.  Physical Findings: The patient is in no acute distress. Patient is alert and oriented.  height is 5\' 4"  (1.626 m) and weight is 193 lb 12.8 oz (87.9 kg). Her oral temperature is 98.8 F (37.1 C). Her blood pressure is 122/73 and her pulse is 81. Her oxygen saturation is 98%. .  No significant changes. Lungs are clear to auscultation bilaterally. Heart has regular rate and rhythm. No palpable cervical, supraclavicular, or axillary adenopathy. Abdomen soft, non-tender, normal bowel sounds. Pt skin has healed well in lower pelvis region. Attempted digital exam, but the pt is very uncomfortable just inside the vaginal introitus. Appears to be some granulation tissue Or residual healing after her radiation treatments.    Lab Findings: Lab Results  Component Value Date   WBC 5.5 08/09/2016   HGB 11.1 08/09/2016   HCT 35.8 08/09/2016   MCV 79 08/09/2016  PLT 198 08/09/2016    Radiographic Findings: No results found.  Impression:  The patient is recovering from the effects of  therapy.  I do not feel that pt is ready to began using a vaginal dilator at this time. I recommend that the pt f/u with Dr. Denman George in the next 3-4 weeks for a detailed exam and determine when she began using a vaginal dilator.  Plan:  Pt will follow up with Radiation Oncology in 4 months.  ____________________________________ -----------------------------------  Blair Promise, PhD, MD  This document serves as a record of services personally performed by Gery Pray, MD. It was created on her behalf by Steva Colder, a trained medical scribe. The creation of this record is based on the scribe's personal observations and the provider's statements to them. This document has been checked and approved by the attending provider.

## 2016-09-06 ENCOUNTER — Other Ambulatory Visit: Payer: Self-pay | Admitting: Family Medicine

## 2016-09-12 DIAGNOSIS — H26493 Other secondary cataract, bilateral: Secondary | ICD-10-CM | POA: Diagnosis not present

## 2016-09-30 ENCOUNTER — Ambulatory Visit: Payer: PPO | Attending: Gynecologic Oncology | Admitting: Gynecologic Oncology

## 2016-09-30 ENCOUNTER — Encounter: Payer: Self-pay | Admitting: Gynecologic Oncology

## 2016-09-30 ENCOUNTER — Ambulatory Visit (HOSPITAL_BASED_OUTPATIENT_CLINIC_OR_DEPARTMENT_OTHER): Payer: PPO

## 2016-09-30 VITALS — BP 147/76 | HR 82 | Temp 98.1°F | Resp 20 | Wt 196.6 lb

## 2016-09-30 DIAGNOSIS — C519 Malignant neoplasm of vulva, unspecified: Secondary | ICD-10-CM

## 2016-09-30 DIAGNOSIS — I10 Essential (primary) hypertension: Secondary | ICD-10-CM | POA: Insufficient documentation

## 2016-09-30 DIAGNOSIS — Z96651 Presence of right artificial knee joint: Secondary | ICD-10-CM | POA: Insufficient documentation

## 2016-09-30 DIAGNOSIS — E119 Type 2 diabetes mellitus without complications: Secondary | ICD-10-CM | POA: Diagnosis not present

## 2016-09-30 DIAGNOSIS — E559 Vitamin D deficiency, unspecified: Secondary | ICD-10-CM | POA: Insufficient documentation

## 2016-09-30 DIAGNOSIS — Z96642 Presence of left artificial hip joint: Secondary | ICD-10-CM | POA: Insufficient documentation

## 2016-09-30 DIAGNOSIS — Z79899 Other long term (current) drug therapy: Secondary | ICD-10-CM | POA: Insufficient documentation

## 2016-09-30 DIAGNOSIS — Z7984 Long term (current) use of oral hypoglycemic drugs: Secondary | ICD-10-CM | POA: Insufficient documentation

## 2016-09-30 DIAGNOSIS — Z7982 Long term (current) use of aspirin: Secondary | ICD-10-CM | POA: Insufficient documentation

## 2016-09-30 DIAGNOSIS — Z881 Allergy status to other antibiotic agents status: Secondary | ICD-10-CM | POA: Diagnosis not present

## 2016-09-30 DIAGNOSIS — R918 Other nonspecific abnormal finding of lung field: Secondary | ICD-10-CM | POA: Diagnosis not present

## 2016-09-30 LAB — COMPREHENSIVE METABOLIC PANEL
ALT: 26 U/L (ref 0–55)
AST: 27 U/L (ref 5–34)
Albumin: 3.6 g/dL (ref 3.5–5.0)
Alkaline Phosphatase: 76 U/L (ref 40–150)
Anion Gap: 11 mEq/L (ref 3–11)
BILIRUBIN TOTAL: 0.49 mg/dL (ref 0.20–1.20)
BUN: 16.7 mg/dL (ref 7.0–26.0)
CO2: 27 mEq/L (ref 22–29)
Calcium: 9.8 mg/dL (ref 8.4–10.4)
Chloride: 106 mEq/L (ref 98–109)
Creatinine: 0.8 mg/dL (ref 0.6–1.1)
EGFR: 73 mL/min/{1.73_m2} — AB (ref >90)
GLUCOSE: 101 mg/dL (ref 70–140)
Potassium: 4.4 mEq/L (ref 3.5–5.1)
SODIUM: 144 meq/L (ref 136–145)
TOTAL PROTEIN: 7.3 g/dL (ref 6.4–8.3)

## 2016-09-30 NOTE — Progress Notes (Signed)
Consult Note: Gyn-Onc  Consult was requested by Dr. Elonda Husky for the evaluation of [name] 76 y.o. female   CC:  Chief Complaint  Patient presents with  . Vulvar cancer [facility])    Assessment/Plan:  [name]  is a 76 y.o.  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 Positive inferior (6 o'clock) margin on left vulvectomy. S/p adjuvant vulvar and inguinal (bilateral) radiation.  Chest nodules on pretreatment CT scan - repeat CT.   Follow-up biopsy of vulva- will need surgery if recurrence confirmed.  HPI: [name] is a 76 year old woman who is seen in consultation at the request of Dr Elonda Husky for a right labial lesion.    The patient reports that she began feeling pruritis of the vulva in October, 2017. She was seen by her PCP who saw no lesion and prescribed estradiol.  The pruritis persisted and became worse and she began to feel a nodule in the area and therefore again saw her PCP followed by a referral to gynecologist, Dr Elonda Husky on 02/20/16. The nodular lesion was appreciated on the right vulva. No biopsy was taken at that time. Biopsy was performed on 02/22/16 and pathology from biopsy confirmed invasive SCC.   CT scan abdo/pelvis was negative for evidence of suspicious nodes or distant mets.  On 02/22/16 she underwent right radical vulvectomy and right inguinal lymphadenectomy with Dr Nancy Marus. Final pathology revealed: a 2.1cm moderately differentiated SCC of the right vulva. Margins were negative (closest was 3 o'clock, 56mm). Depth of invasion was 71mm. 1 of 6 inguinofemoral lymph nodes (right) was positive for metastatic disease.   CT chest on 04/01/16 showed: Multiple pulmonary nodules as detailed above, generally in the 23mm and less average diameter range although with 1 right lower lobe subpleural nodule measuring about 8 mm in diameter. These likely warrant surveillance. Hypodense right  thyroid nodule, 1.1 cm in long axis. Consider further evaluation with thyroid ultrasound. If patient is clinically hyperthyroid, consider nuclear medicine thyroid uptake and scan. Stable hypodense lateral splenic lesion, likely a cyst.  Postoperatively after her first vulvectomy she developed irritation on the left vulvar lip. She saw [place] for evaluation in March and complained of this irritation. A nodular lesion was seen in the mid portion of the left labia minora in a "kissing" location to where the primary lesion had been. It was biopsied and confirmed as recurrent SCC of the vulva.  On 04/09/16 she was again taken to the OR, this time for a radical left vulvectomy. A decision was made to not perform left inguinal lymphadenectomy, so as to not delay radiation further (with an additional groin wound to heal), but to instead extend the radiation to both groins.  Pathology from this surgery confirmed a poorly differentiated squamous cell carcinoma of the left vulva with 2mm of invasion. The inferior 6 o'clock margin was "broadly" positive (though macroscopically this margin was grossly negative.  Interval Hx:  She was recommended to receive adjuvant radiation to the vulva and groins. Radiation treatment dates of 06/05/16-07/25/16 where she received 45Gy in 18fractions directed at the bilateral inguinal and vulvar area. The patient then proceeded with a boost to the vulvar region the light of the positive surgical margin and continue to a dose of 63 gray.  She has burning and itching on the anterior right vulva.  Current Meds:  Outpatient Encounter Prescriptions as of 09/30/2016  Medication Sig  .  aspirin 81 MG tablet Take 81 mg by mouth daily.  Marland Kitchen CINNAMON PO Take 2 capsules by mouth daily. 2,000 mg daily   . ferrous sulfate 325 (65 FE) MG tablet Take 325 mg by mouth daily with breakfast.  . furosemide (LASIX) 40 MG tablet TAKE ONE TABLET BY MOUTH ONCE DAILY.  Marland Kitchen gabapentin (NEURONTIN) 100 MG capsule  TAKE 1 CAPSULE BY MOUTH THREE TIMES A DAY.  Marland Kitchen HYDROcodone-acetaminophen (NORCO) 5-325 MG tablet Take 1 tablet by mouth every 6 (six) hours as needed for moderate pain. (Patient not taking: Reported on 08/29/2016)  . losartan (COZAAR) 50 MG tablet TAKE ONE TABLET BY MOUTH ONCE DAILY.  . metFORMIN (GLUCOPHAGE) 500 MG tablet TAKE (1) TABLET BY MOUTH TWICE DAILY.  . Multiple Vitamins-Minerals (MULTIVITAMIN ADULT PO) Take by mouth daily.   . potassium chloride SA (K-DUR,KLOR-CON) 20 MEQ tablet Take 1 tablet (20 mEq total) by mouth daily.   No facility-administered encounter medications on file as of 09/30/2016.     Allergy:  Allergies  Allergen Reactions  . Celebrex [Celecoxib]     GI upset   . Doxycycline Nausea Only    Gi upset  . Iron     Pt cannot tolerate some iron preparations. The yupset her stomach.  . Lodine [Etodolac] Nausea Only    Gi upset  . Nabumetone     (relafen)  . Tetracycline   . Xarelto [Rivaroxaban] Hives    Social Hx:   Social History   Social History  . Marital status: Married    Spouse name: N/A  . Number of children: 2  . Years of education: N/A   Occupational History  . Not on file.   Social History Main Topics  . Smoking status: Never Smoker  . Smokeless tobacco: Never Used  . Alcohol use No  . Drug use: No  . Sexual activity: Not on file   Other Topics Concern  . Not on file   Social History Narrative  . No narrative on file    Past Surgical Hx:  Past Surgical History:  Procedure Laterality Date  . ABDOMINAL HYSTERECTOMY     complete  . BACK SURGERY     upper  with plate frontal fusion and lower x 2  . COLONOSCOPY    . COLONOSCOPY N/A 05/05/2016   Procedure: COLONOSCOPY;  Surgeon: Daneil Dolin, MD;  Location: [facility];  Service: Endoscopy;  Laterality: N/A;  . ESOPHAGOGASTRODUODENOSCOPY N/A 05/04/2016   Procedure: ESOPHAGOGASTRODUODENOSCOPY (EGD);  Surgeon: Daneil Dolin, MD;  Location: [facility];  Service: Endoscopy;   Laterality: N/A;  . GIVENS CAPSULE STUDY N/A 05/06/2016   Procedure: GIVENS CAPSULE STUDY;  Surgeon: Daneil Dolin, MD;  Location: [facility];  Service: Endoscopy;  Laterality: N/A;  . KNEE SURGERY Left    arthroscopy  . RADICAL VULVECTOMY Left 04/09/2016   Procedure: RADICAL LEFT VULVECTOMY;  Surgeon: Everitt Amber, MD;  Location: [facility];  Service: Gynecology;  Laterality: Left;  . TOE SURGERY Left   . TONSILLECTOMY    . TOTAL HIP ARTHROPLASTY Left 10/27/2013   Procedure: LEFT TOTAL HIP ARTHROPLASTY;  Surgeon: Tobi Bastos, MD;  Location: [facility];  Service: Orthopedics;  Laterality: Left;  . TOTAL KNEE ARTHROPLASTY Right   . VULVA /PERINEUM BIOPSY  02/22/2016  . VULVECTOMY Right 02/27/2016   Procedure: RADICAL RIGHT VULVECTOMY WITH RIGHT INGUINAL LYMPH NODE DISSECTION;  Surgeon: Nancy Marus, MD;  Location: [facility];  Service: Gynecology;  Laterality: Right;  Past Medical Hx:  Past Medical History:  Diagnosis Date  . Aortic stenosis, mild   . Aortic valve stenosis, mild   . Arthritis   . Cancer ([place])    vulvar   . Complication of anesthesia   . Diabetes mellitus without complication ([place])    type 2  . Heart murmur   . History of blood transfusion age 86 or 59 months old  . History of radiation therapy 06/05/16 - 07/25/16   Pelvis treated to 45 Gy in 25 fractions, then Boosted an additional 18 Gy in 9 fractions.  . Hypertension   . IFG (impaired fasting glucose)   . PONV (postoperative nausea and vomiting)    years ago with back surgery-nausea  . Vitamin D deficiency     Past Gynecological History:  Remote hx of hysterectomy for fibroids. No hx of abnromal paps (last pap remote) No LMP recorded. Patient has had a hysterectomy.  Family Hx:  Family History  Problem Relation Age of Onset  . Hypertension Mother   . Hypertension Father   . Throat cancer Father   . Cancer Sister        Breast  . Hypertension Brother     Review of Systems:  Constitutional  Feels well,     ENT Normal appearing ears and nares bilaterally Skin/Breast  + vulvar pruritis. Cardiovascular  No chest pain, shortness of breath, or edema  Pulmonary  No cough or wheeze.  Gastro Intestinal  No nausea, vomitting, or diarrhoea. No bright red blood per rectum, no abdominal pain, change in bowel movement, or constipation.  Genito Urinary  No frequency, urgency, dysuria, no bleeding Musculo Skeletal  No myalgia, arthralgia, joint swelling or pain  Neurologic  No weakness, numbness, change in gait,  Psychology  No depression, anxiety, insomnia.   Vitals:  Blood pressure (!) 147/76, pulse 82, temperature 98.1 F (36.7 C), temperature source Oral, resp. rate 20, weight 196 lb 9.6 oz (89.2 kg), SpO2 96 %.  Physical Exam: WD in NAD Neck  Supple NROM, without any enlargements.  Lymph Node Survey No cervical supraclavicular or inguinal adenopathy. Right groin incision well healed.  Cardiovascular  Pulse normal rate, regularity and rhythm. S1 and S2 normal.  Lungs  Clear to auscultation bilateraly, without wheezes/crackles/rhonchi. Good air movement.  Skin  No rash/lesions/breakdown  Psychiatry  Alert and oriented to person, place, and time  Abdomen  Normoactive bowel sounds, abdomen soft, non-tender and obese without evidence of hernia.  Back No CVA tenderness Genito Urinary  Vulva/vagina: Radiation changes to vulva. 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 Suspicious for recurrence.  Bladder/urethra:  No lesions or masses, well supported bladder  Vagina: atrophic, no lesions  Cervix: surgically absent  Uterus: surgically absen  Adnexa: no palpable masses. Rectal  deferred Extremities  No bilateral cyanosis, clubbing or edema.  Procedure Note:  Verbal consent obtained Time out performed Betadine to swab vulva 1cc of 2% lidocaine infiltrated into anterior mons. tischler forcep used to take piece of  representative area of leukoplakia. Hemostasis obtained with silver nitrate. Specimen: anterior vulva to pathology  Donaciano Eva, MD  09/30/2016, 2:06 PM

## 2016-09-30 NOTE — Patient Instructions (Signed)
We will call you the results of your biopsy from today.  Plan on having a CT scan of the chest and we will call you with the results.  Please call our office for any questions or concerns.

## 2016-10-07 ENCOUNTER — Encounter (HOSPITAL_COMMUNITY): Payer: Self-pay

## 2016-10-07 ENCOUNTER — Telehealth: Payer: Self-pay | Admitting: Gynecologic Oncology

## 2016-10-07 ENCOUNTER — Ambulatory Visit (HOSPITAL_COMMUNITY)
Admission: RE | Admit: 2016-10-07 | Discharge: 2016-10-07 | Disposition: A | Discharge status: Home / Self Care | Payer: PPO | Source: Ambulatory Visit | Attending: Gynecologic Oncology | Admitting: Gynecologic Oncology

## 2016-10-07 DIAGNOSIS — I700 Atherosclerosis of aorta: Secondary | ICD-10-CM | POA: Diagnosis not present

## 2016-10-07 DIAGNOSIS — R918 Other nonspecific abnormal finding of lung field: Secondary | ICD-10-CM | POA: Diagnosis not present

## 2016-10-07 DIAGNOSIS — I2510 Atherosclerotic heart disease of native coronary artery without angina pectoris: Secondary | ICD-10-CM | POA: Diagnosis not present

## 2016-10-07 DIAGNOSIS — C519 Malignant neoplasm of vulva, unspecified: Secondary | ICD-10-CM | POA: Diagnosis not present

## 2016-10-07 MED ORDER — IOPAMIDOL (ISOVUE-300) INJECTION 61%
75.0000 mL | Freq: Once | INTRAVENOUS | Status: AC | PRN
  Administered 2016-10-07: 75 mL via INTRAVENOUS

## 2016-10-07 MED ORDER — IOPAMIDOL (ISOVUE-300) INJECTION 61%
INTRAVENOUS | Status: AC
  Administered 2016-10-07: 75 mL via INTRAVENOUS
  Filled 2016-10-07: qty 75

## 2016-10-07 NOTE — Telephone Encounter (Signed)
Informed patient of biopsy results. Plan is for partial radical vulvectomy of area anteriorally. Will schedule this at [facility] on 10/17/16.  Patient vocalized understanding.  Explained increased risks of wound healing following prior radiation.

## 2016-10-08 ENCOUNTER — Telehealth: Payer: Self-pay | Admitting: Gynecologic Oncology

## 2016-10-08 ENCOUNTER — Ambulatory Visit (INDEPENDENT_AMBULATORY_CARE_PROVIDER_SITE_OTHER): Payer: PPO | Admitting: *Deleted

## 2016-10-08 DIAGNOSIS — Z23 Encounter for immunization: Secondary | ICD-10-CM

## 2016-10-08 NOTE — Telephone Encounter (Signed)
Called patient to inform her that her surgery has been scheduled for Sept 27 and she will receive a phone call from the hospital.  Advised to call for any questions or concerns.

## 2016-10-11 DIAGNOSIS — L609 Nail disorder, unspecified: Secondary | ICD-10-CM | POA: Diagnosis not present

## 2016-10-11 DIAGNOSIS — E1342 Other specified diabetes mellitus with diabetic polyneuropathy: Secondary | ICD-10-CM | POA: Diagnosis not present

## 2016-10-11 DIAGNOSIS — L851 Acquired keratosis [keratoderma] palmaris et plantaris: Secondary | ICD-10-CM | POA: Diagnosis not present

## 2016-10-11 NOTE — Patient Instructions (Addendum)
[name]  10/11/2016   Your procedure is scheduled on: 10-17-16   Report to [facility] Main  Entrance Take [place]  Elevators to 3rd floor to  [place] at 10:30 AM.    Call this number if you have problems the morning of surgery [phone]    Remember: ONLY 1 PERSON MAY GO WITH YOU TO SHORT STAY TO GET  READY MORNING OF YOUR SURGERY.  Do not eat food or drink liquids :After Midnight. You may have a Clear Liquid Diet from Midnight until 7:00 AM. After 7:00AM, nothing until after surgery.     CLEAR LIQUID DIET   Foods Allowed                                                                     Foods Excluded  Coffee and tea, regular and decaf                             liquids that you cannot  Plain Jell-O in any flavor                                             see through such as: Fruit ices (not with fruit pulp)                                     milk, soups, orange juice  Iced Popsicles                                    All solid food Carbonated beverages, regular and diet                                    Cranberry, grape and apple juices Sports drinks like Gatorade Lightly seasoned clear broth or consume(fat free) Sugar, honey syrup  Sample Menu Breakfast                                Lunch                                     Supper Cranberry juice                    Beef broth                            Chicken broth Jell-O                                     Grape  juice                           Apple juice Coffee or tea                        Jell-O                                      Popsicle                                                Coffee or tea                        Coffee or tea  _____________________________________________________________________     Take these medicines the morning of surgery with A SIP OF WATER: Neurontin (Gabapentin)                                You may not have any metal on your body  including hair pins and              piercings  Do not wear jewelry, make-up, lotions, powders or perfumes, deodorant             Do not wear nail polish.  Do not shave  48 hours prior to surgery.                 Do not bring valuables to the hospital. [place].  Contacts, dentures or bridgework may not be worn into surgery.  Your driver: Delfino Lovett [phone]               Please read over the following fact sheets you were given: _____________________________________________________________________  How to Manage Your Diabetes Before and After Surgery  Why is it important to control my blood sugar before and after surgery? . Improving blood sugar levels before and after surgery helps healing and can limit problems. . A way of improving blood sugar control is eating a healthy diet by: o  Eating less sugar and carbohydrates o  Increasing activity/exercise o  Talking with your doctor about reaching your blood sugar goals . High blood sugars (greater than 180 mg/dL) can raise your risk of infections and slow your recovery, so you will need to focus on controlling your diabetes during the weeks before surgery. . Make sure that the doctor who takes care of your diabetes knows about your planned surgery including the date and location.  How do I manage my blood sugar before surgery? . Check your blood sugar at least 4 times a day, starting 2 days before surgery, to make sure that the level is not too high or low. o Check your blood sugar the morning of your surgery when you wake up and every 2 hours until you get to the Short Stay unit. . If your blood sugar is less than 70 mg/dL, you will need to treat for low blood sugar: o Do not take insulin. o Treat a low blood sugar (less than 70  mg/dL) with  cup of clear juice (cranberry or apple), 4 glucose tablets, OR glucose gel. o Recheck blood sugar in 15 minutes after treatment (to make sure it  is greater than 70 mg/dL). If your blood sugar is not greater than 70 mg/dL on recheck, call [phone] for further instructions. . Report your blood sugar to the short stay nurse when you get to Short Stay.  . If you are admitted to the hospital after surgery: o Your blood sugar will be checked by the staff and you will probably be given insulin after surgery (instead of oral diabetes medicines) to make sure you have good blood sugar levels. o The goal for blood sugar control after surgery is 80-180 mg/dL.   WHAT DO I DO ABOUT MY DIABETES MEDICATION?  Marland Kitchen Do not take oral diabetes medicines (pills) the morning of surgery.  . THE DAY BEFORE SURGERY, take your usual dose of Metformin         Patient Signature:  Date:   Nurse Signature:  Date:   Reviewed and Endorsed by [facility] Patient Education Committee, August 2015           [facility] - Preparing for Surgery Before surgery, you can play an important role.  Because skin is not sterile, your skin needs to be as free of germs as possible.  You can reduce the number of germs on your skin by washing with CHG (chlorahexidine gluconate) soap before surgery.  CHG is an antiseptic cleaner which kills germs and bonds with the skin to continue killing germs even after washing. Please DO NOT use if you have an allergy to CHG or antibacterial soaps.  If your skin becomes reddened/irritated stop using the CHG and inform your nurse when you arrive at Short Stay. Do not shave (including legs and underarms) for at least 48 hours prior to the first CHG shower.  You may shave your face/neck. Please follow these instructions carefully:  1.  Shower with CHG Soap the night before surgery and the  morning of Surgery.  2.  If you choose to wash your hair, wash your hair first as usual with your  normal  shampoo.  3.  After you shampoo, rinse your hair and body thoroughly to remove the  shampoo.                           4.  Use CHG as you would any  other liquid soap.  You can apply chg directly  to the skin and wash                       Gently with a scrungie or clean washcloth.  5.  Apply the CHG Soap to your body ONLY FROM THE NECK DOWN.   Do not use on face/ open                           Wound or open sores. Avoid contact with eyes, ears mouth and genitals (private parts).                       Wash face,  Genitals (private parts) with your normal soap.             6.  Wash thoroughly, paying special attention to the area where your surgery  will be performed.  7.  Thoroughly rinse your  body with warm water from the neck down.  8.  DO NOT shower/wash with your normal soap after using and rinsing off  the CHG Soap.                9.  Pat yourself dry with a clean towel.            10.  Wear clean pajamas.            11.  Place clean sheets on your bed the night of your first shower and do not  sleep with pets. Day of Surgery : Do not apply any lotions/deodorants the morning of surgery.  Please wear clean clothes to the hospital/surgery center.  FAILURE TO FOLLOW THESE INSTRUCTIONS MAY RESULT IN THE CANCELLATION OF YOUR SURGERY PATIENT SIGNATURE_________________________________  NURSE SIGNATURE__________________________________  ________________________________________________________________________

## 2016-10-11 NOTE — Progress Notes (Addendum)
05-03-16 (EPIC) EKG  09-30-16 (EPIC) CMP   10-07-16 (EPIC) CT chest

## 2016-10-14 ENCOUNTER — Encounter (HOSPITAL_COMMUNITY)
Admission: RE | Admit: 2016-10-14 | Discharge: 2016-10-14 | Disposition: A | Discharge status: Home / Self Care | Payer: PPO | Source: Ambulatory Visit | Attending: Gynecologic Oncology | Admitting: Gynecologic Oncology

## 2016-10-14 ENCOUNTER — Encounter (HOSPITAL_COMMUNITY): Payer: Self-pay

## 2016-10-14 DIAGNOSIS — C519 Malignant neoplasm of vulva, unspecified: Secondary | ICD-10-CM | POA: Insufficient documentation

## 2016-10-14 DIAGNOSIS — Z01812 Encounter for preprocedural laboratory examination: Secondary | ICD-10-CM | POA: Diagnosis not present

## 2016-10-14 LAB — HEMOGLOBIN A1C
HEMOGLOBIN A1C: 5.6 % (ref 4.8–5.6)
MEAN PLASMA GLUCOSE: 114.02 mg/dL

## 2016-10-14 LAB — CBC
HEMATOCRIT: 35.4 % — AB (ref 36.0–46.0)
HEMOGLOBIN: 11.1 g/dL — AB (ref 12.0–15.0)
MCH: 26.2 pg (ref 26.0–34.0)
MCHC: 31.4 g/dL (ref 30.0–36.0)
MCV: 83.7 fL (ref 78.0–100.0)
Platelets: 196 10*3/uL (ref 150–400)
RBC: 4.23 MIL/uL (ref 3.87–5.11)
RDW: 14.8 % (ref 11.5–15.5)
WBC: 5.9 10*3/uL (ref 4.0–10.5)

## 2016-10-14 LAB — GLUCOSE, CAPILLARY: GLUCOSE-CAPILLARY: 85 mg/dL (ref 65–99)

## 2016-10-14 NOTE — Progress Notes (Signed)
10-14-16 Spoke to Dr. Ola Spurr regarding ECHO results. Patient okay to have surgery schedule on 10-17-16, however, Dr. Ola Spurr feels that patient should scheduled an appointment with a Cardiologist.   Spoke to patient and advised the above. Pt indicated that she has her annual physical scheduled in November. At that appointment, she will obtain a referral to see Dr. Bronson Ing at [facility].

## 2016-10-16 ENCOUNTER — Other Ambulatory Visit: Payer: Self-pay | Admitting: Family Medicine

## 2016-10-17 ENCOUNTER — Encounter (HOSPITAL_COMMUNITY): Payer: Self-pay | Admitting: Certified Registered Nurse Anesthetist

## 2016-10-17 ENCOUNTER — Ambulatory Visit (HOSPITAL_COMMUNITY): Payer: PPO | Admitting: Anesthesiology

## 2016-10-17 ENCOUNTER — Ambulatory Visit (HOSPITAL_COMMUNITY)
Admission: RE | Admit: 2016-10-17 | Discharge: 2016-10-18 | Disposition: A | Discharge status: Home / Self Care | Payer: PPO | Source: Ambulatory Visit | Attending: Gynecologic Oncology | Admitting: Gynecologic Oncology

## 2016-10-17 ENCOUNTER — Encounter (HOSPITAL_COMMUNITY)
Admission: RE | Disposition: A | Discharge status: Home / Self Care | Payer: Self-pay | Source: Ambulatory Visit | Attending: Gynecologic Oncology

## 2016-10-17 DIAGNOSIS — I10 Essential (primary) hypertension: Secondary | ICD-10-CM | POA: Diagnosis not present

## 2016-10-17 DIAGNOSIS — Z7984 Long term (current) use of oral hypoglycemic drugs: Secondary | ICD-10-CM | POA: Diagnosis not present

## 2016-10-17 DIAGNOSIS — E119 Type 2 diabetes mellitus without complications: Secondary | ICD-10-CM | POA: Insufficient documentation

## 2016-10-17 DIAGNOSIS — Z79899 Other long term (current) drug therapy: Secondary | ICD-10-CM | POA: Diagnosis not present

## 2016-10-17 DIAGNOSIS — C519 Malignant neoplasm of vulva, unspecified: Secondary | ICD-10-CM | POA: Diagnosis not present

## 2016-10-17 DIAGNOSIS — Z7982 Long term (current) use of aspirin: Secondary | ICD-10-CM | POA: Diagnosis not present

## 2016-10-17 DIAGNOSIS — D62 Acute posthemorrhagic anemia: Secondary | ICD-10-CM | POA: Diagnosis not present

## 2016-10-17 HISTORY — PX: RADICAL VULVECTOMY: SHX6584

## 2016-10-17 LAB — GLUCOSE, CAPILLARY
GLUCOSE-CAPILLARY: 76 mg/dL (ref 65–99)
Glucose-Capillary: 112 mg/dL — ABNORMAL HIGH (ref 65–99)

## 2016-10-17 SURGERY — VULVECTOMY, RADICAL
Anesthesia: General

## 2016-10-17 MED ORDER — ROCURONIUM BROMIDE 50 MG/5ML IV SOSY
PREFILLED_SYRINGE | INTRAVENOUS | Status: AC
  Filled 2016-10-17: qty 5

## 2016-10-17 MED ORDER — FENTANYL CITRATE (PF) 250 MCG/5ML IJ SOLN
INTRAMUSCULAR | Status: DC | PRN
  Administered 2016-10-17: 50 ug via INTRAVENOUS

## 2016-10-17 MED ORDER — ONDANSETRON HCL 4 MG PO TABS: 4.0000 mg | ORAL_TABLET | Freq: Four times a day (QID) | ORAL | Status: DC | PRN

## 2016-10-17 MED ORDER — MIDAZOLAM HCL 5 MG/5ML IJ SOLN
INTRAMUSCULAR | Status: DC | PRN
  Administered 2016-10-17: 1 mg via INTRAVENOUS

## 2016-10-17 MED ORDER — PROPOFOL 10 MG/ML IV BOLUS
INTRAVENOUS | Status: AC
  Filled 2016-10-17: qty 20

## 2016-10-17 MED ORDER — ACETIC ACID 5 % SOLN
1.0000 "application " | Freq: Once | Status: DC
  Filled 2016-10-17: qty 500

## 2016-10-17 MED ORDER — PROPOFOL 10 MG/ML IV BOLUS
INTRAVENOUS | Status: DC | PRN
  Administered 2016-10-17: 130 mg via INTRAVENOUS

## 2016-10-17 MED ORDER — LIDOCAINE HCL (CARDIAC) 20 MG/ML IV SOLN
INTRAVENOUS | Status: DC | PRN
  Administered 2016-10-17: 60 mg via INTRATRACHEAL

## 2016-10-17 MED ORDER — PROMETHAZINE HCL 25 MG/ML IJ SOLN: 6.2500 mg | INTRAMUSCULAR | Status: DC | PRN

## 2016-10-17 MED ORDER — METFORMIN HCL 500 MG PO TABS
500.0000 mg | ORAL_TABLET | Freq: Two times a day (BID) | ORAL | Status: DC
  Administered 2016-10-17 – 2016-10-18 (×2): 500 mg via ORAL
  Filled 2016-10-17 (×2): qty 1

## 2016-10-17 MED ORDER — BUPIVACAINE HCL (PF) 0.25 % IJ SOLN
INTRAMUSCULAR | Status: AC
  Filled 2016-10-17: qty 30

## 2016-10-17 MED ORDER — SUCCINYLCHOLINE CHLORIDE 200 MG/10ML IV SOSY
PREFILLED_SYRINGE | INTRAVENOUS | Status: AC
  Filled 2016-10-17: qty 10

## 2016-10-17 MED ORDER — HYDROMORPHONE HCL-NACL 0.5-0.9 MG/ML-% IV SOSY: 0.2000 mg | PREFILLED_SYRINGE | INTRAVENOUS | Status: DC | PRN

## 2016-10-17 MED ORDER — BUPIVACAINE LIPOSOME 1.3 % IJ SUSP
20.0000 mL | Freq: Once | INTRAMUSCULAR | Status: AC
  Administered 2016-10-17: 20 mL
  Filled 2016-10-17: qty 20

## 2016-10-17 MED ORDER — ONDANSETRON HCL 4 MG/2ML IJ SOLN
INTRAMUSCULAR | Status: DC | PRN
  Administered 2016-10-17: 4 mg via INTRAVENOUS

## 2016-10-17 MED ORDER — LOSARTAN POTASSIUM 50 MG PO TABS
50.0000 mg | ORAL_TABLET | Freq: Every day | ORAL | Status: DC
  Administered 2016-10-18: 50 mg via ORAL
  Filled 2016-10-17: qty 1

## 2016-10-17 MED ORDER — POTASSIUM CHLORIDE CRYS ER 20 MEQ PO TBCR
20.0000 meq | EXTENDED_RELEASE_TABLET | Freq: Every day | ORAL | Status: DC
  Administered 2016-10-18: 20 meq via ORAL
  Filled 2016-10-17: qty 1

## 2016-10-17 MED ORDER — FUROSEMIDE 40 MG PO TABS
40.0000 mg | ORAL_TABLET | Freq: Every day | ORAL | Status: DC
  Administered 2016-10-18: 40 mg via ORAL
  Filled 2016-10-17: qty 1

## 2016-10-17 MED ORDER — FENTANYL CITRATE (PF) 100 MCG/2ML IJ SOLN: 25.0000 ug | INTRAMUSCULAR | Status: DC | PRN

## 2016-10-17 MED ORDER — ASPIRIN EC 81 MG PO TBEC
81.0000 mg | DELAYED_RELEASE_TABLET | Freq: Every evening | ORAL | Status: DC
  Administered 2016-10-17: 81 mg via ORAL
  Filled 2016-10-17: qty 1

## 2016-10-17 MED ORDER — OXYCODONE-ACETAMINOPHEN 5-325 MG PO TABS
1.0000 | ORAL_TABLET | ORAL | Status: DC | PRN
  Administered 2016-10-18 (×2): 1 via ORAL
  Filled 2016-10-17 (×2): qty 1

## 2016-10-17 MED ORDER — MIDAZOLAM HCL 2 MG/2ML IJ SOLN
INTRAMUSCULAR | Status: AC
  Filled 2016-10-17: qty 2

## 2016-10-17 MED ORDER — LIDOCAINE 2% (20 MG/ML) 5 ML SYRINGE
INTRAMUSCULAR | Status: AC
  Filled 2016-10-17: qty 5

## 2016-10-17 MED ORDER — ONDANSETRON HCL 4 MG/2ML IJ SOLN: 4.0000 mg | Freq: Four times a day (QID) | INTRAMUSCULAR | Status: DC | PRN

## 2016-10-17 MED ORDER — FENTANYL CITRATE (PF) 250 MCG/5ML IJ SOLN
INTRAMUSCULAR | Status: AC
  Filled 2016-10-17: qty 5

## 2016-10-17 MED ORDER — 0.9 % SODIUM CHLORIDE (POUR BTL) OPTIME
TOPICAL | Status: DC | PRN
  Administered 2016-10-17: 1000 mL

## 2016-10-17 MED ORDER — BUPIVACAINE HCL (PF) 0.25 % IJ SOLN
INTRAMUSCULAR | Status: DC | PRN
  Administered 2016-10-17: 20 mL

## 2016-10-17 MED ORDER — LACTATED RINGERS IV SOLN
INTRAVENOUS | Status: DC
  Administered 2016-10-17 (×2): via INTRAVENOUS

## 2016-10-17 SURGICAL SUPPLY — 27 items
BLADE SURG 15 STRL LF DISP TIS (BLADE) ×2 IMPLANT
BLADE SURG 15 STRL SS (BLADE) ×1
BNDG GAUZE ELAST 4 BULKY (GAUZE/BANDAGES/DRESSINGS) IMPLANT
BRIEF STRETCH FOR OB PAD LRG (UNDERPADS AND DIAPERS) ×3 IMPLANT
COVER SURGICAL LIGHT HANDLE (MISCELLANEOUS) ×3 IMPLANT
DRAPE SHEET LG 3/4 BI-LAMINATE (DRAPES) ×3 IMPLANT
DRSG PAD ABDOMINAL 8X10 ST (GAUZE/BANDAGES/DRESSINGS) IMPLANT
ELECT PENCIL ROCKER SW 15FT (MISCELLANEOUS) ×3 IMPLANT
GLOVE BIO SURGEON STRL SZ 6 (GLOVE) ×6 IMPLANT
GLOVE BIO SURGEON STRL SZ 6.5 (GLOVE) ×3 IMPLANT
GOWN STRL REUS W/ TWL LRG LVL3 (GOWN DISPOSABLE) ×4 IMPLANT
GOWN STRL REUS W/TWL LRG LVL3 (GOWN DISPOSABLE) ×2
KIT BASIN OR (CUSTOM PROCEDURE TRAY) ×3 IMPLANT
NEEDLE HYPO 25X1 1.5 SAFETY (NEEDLE) ×3 IMPLANT
PACK LITHOTOMY IV (CUSTOM PROCEDURE TRAY) ×3 IMPLANT
PACK PERINEAL COLD (PAD) ×12 IMPLANT
PAD TELFA 2X3 NADH STRL (GAUZE/BANDAGES/DRESSINGS) IMPLANT
SPONGE LAP 18X18 X RAY DECT (DISPOSABLE) ×3 IMPLANT
SUT VIC AB 2-0 SH 27 (SUTURE) ×2
SUT VIC AB 2-0 SH 27X BRD (SUTURE) ×4 IMPLANT
SUT VIC AB 3-0 SH 27 (SUTURE) ×2
SUT VIC AB 3-0 SH 27X BRD (SUTURE) ×4 IMPLANT
SUT VIC AB 4-0 PS2 27 (SUTURE) ×6 IMPLANT
SUT VIC AB 4-0 SH 18 (SUTURE) IMPLANT
SYR CONTROL 10ML LL (SYRINGE) ×3 IMPLANT
TRAY FOLEY W/METER SILVER 16FR (SET/KITS/TRAYS/PACK) IMPLANT
YANKAUER SUCT BULB TIP 10FT TU (MISCELLANEOUS) ×3 IMPLANT

## 2016-10-17 NOTE — Transfer of Care (Signed)
Immediate Anesthesia Transfer of Care Note  Patient: [name]  Procedure(s) Performed: Procedure(s): RADICAL ANTERIOR VULVECTOMY  Patient Location: PACU  Anesthesia Type:General  Level of Consciousness: awake, alert  and oriented  Airway & Oxygen Therapy: Patient Spontanous Breathing and Patient connected to face mask oxygen  Post-op Assessment: Report given to RN and Post -op Vital signs reviewed and stable  Post vital signs: Reviewed and stable  Last Vitals: There were no vitals filed for this visit.  Last Pain:  Vitals:   10/17/16 1046  PainSc: 0-No pain         Complications: No apparent anesthesia complications

## 2016-10-17 NOTE — Anesthesia Procedure Notes (Signed)
Procedure Name: LMA Insertion Date/Time: 10/17/2016 12:30 PM Performed by: [place] Zamari Vea C Pre-anesthesia Checklist: Patient identified, Emergency Drugs available, Suction available and Patient being monitored Patient Re-evaluated:Patient Re-evaluated prior to induction Oxygen Delivery Method: Circle system utilized Preoxygenation: Pre-oxygenation with 100% oxygen Induction Type: IV induction Ventilation: Mask ventilation without difficulty LMA: LMA inserted LMA Size: 4.0 Number of attempts: 1 Airway Equipment and Method: Bite block Placement Confirmation: positive ETCO2 Tube secured with: Tape Dental Injury: Teeth and Oropharynx as per pre-operative assessment

## 2016-10-17 NOTE — Anesthesia Postprocedure Evaluation (Signed)
Anesthesia Post Note  Patient: [name]  Procedure(Campbell) Performed: Procedure(Campbell): RADICAL ANTERIOR VULVECTOMY     Patient location during evaluation: PACU Anesthesia Type: General Level of consciousness: awake and alert Pain management: pain level controlled Vital Signs Assessment: post-procedure vital signs reviewed and stable Respiratory status: spontaneous breathing, nonlabored ventilation, respiratory function stable and patient connected to nasal cannula oxygen Cardiovascular status: blood pressure returned to baseline and stable Postop Assessment: no apparent nausea or vomiting Anesthetic complications: no    Last Vitals:  Vitals:   10/17/16 1400 10/17/16 1415  BP: (!) 161/88 (!) 167/87  Pulse: 77 74  Resp: (!) 8 (!) 8  Temp:    SpO2: 100% 100%    Last Pain:  Vitals:   10/17/16 1415  PainSc: 0-No pain                 [name]

## 2016-10-17 NOTE — H&P (View-Only) (Signed)
Consult Note: Gyn-Onc  Consult was requested by Dr. Elonda Husky for the evaluation of [name] 76 y.o. female   CC:  Chief Complaint  Patient presents with  . Vulvar cancer [facility])    Assessment/Plan:  [name]  is a 76 y.o.  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 Positive inferior (6 o'clock) margin on left vulvectomy. S/p adjuvant vulvar and inguinal (bilateral) radiation.  Chest nodules on pretreatment CT scan - repeat CT.   Follow-up biopsy of vulva- will need surgery if recurrence confirmed.  HPI: [name] is a 76 year old woman who is seen in consultation at the request of Dr Elonda Husky for a right labial lesion.    The patient reports that she began feeling pruritis of the vulva in October, 2017. She was seen by her PCP who saw no lesion and prescribed estradiol.  The pruritis persisted and became worse and she began to feel a nodule in the area and therefore again saw her PCP followed by a referral to gynecologist, Dr Elonda Husky on 02/20/16. The nodular lesion was appreciated on the right vulva. No biopsy was taken at that time. Biopsy was performed on 02/22/16 and pathology from biopsy confirmed invasive SCC.   CT scan abdo/pelvis was negative for evidence of suspicious nodes or distant mets.  On 02/22/16 she underwent right radical vulvectomy and right inguinal lymphadenectomy with Dr Nancy Marus. Final pathology revealed: a 2.1cm moderately differentiated SCC of the right vulva. Margins were negative (closest was 3 o'clock, 24mm). Depth of invasion was 74mm. 1 of 6 inguinofemoral lymph nodes (right) was positive for metastatic disease.   CT chest on 04/01/16 showed: Multiple pulmonary nodules as detailed above, generally in the 81mm and less average diameter range although with 1 right lower lobe subpleural nodule measuring about 8 mm in diameter. These likely warrant surveillance. Hypodense right  thyroid nodule, 1.1 cm in long axis. Consider further evaluation with thyroid ultrasound. If patient is clinically hyperthyroid, consider nuclear medicine thyroid uptake and scan. Stable hypodense lateral splenic lesion, likely a cyst.  Postoperatively after her first vulvectomy she developed irritation on the left vulvar lip. She saw [place] for evaluation in March and complained of this irritation. A nodular lesion was seen in the mid portion of the left labia minora in a "kissing" location to where the primary lesion had been. It was biopsied and confirmed as recurrent SCC of the vulva.  On 04/09/16 she was again taken to the OR, this time for a radical left vulvectomy. A decision was made to not perform left inguinal lymphadenectomy, so as to not delay radiation further (with an additional groin wound to heal), but to instead extend the radiation to both groins.  Pathology from this surgery confirmed a poorly differentiated squamous cell carcinoma of the left vulva with 9mm of invasion. The inferior 6 o'clock margin was "broadly" positive (though macroscopically this margin was grossly negative.  Interval Hx:  She was recommended to receive adjuvant radiation to the vulva and groins. Radiation treatment dates of 06/05/16-07/25/16 where she received 45Gy in 24fractions directed at the bilateral inguinal and vulvar area. The patient then proceeded with a boost to the vulvar region the light of the positive surgical margin and continue to a dose of 63 gray.  She has burning and itching on the anterior right vulva.  Current Meds:  Outpatient Encounter Prescriptions as of 09/30/2016  Medication Sig  .  aspirin 81 MG tablet Take 81 mg by mouth daily.  Marland Kitchen CINNAMON PO Take 2 capsules by mouth daily. 2,000 mg daily   . ferrous sulfate 325 (65 FE) MG tablet Take 325 mg by mouth daily with breakfast.  . furosemide (LASIX) 40 MG tablet TAKE ONE TABLET BY MOUTH ONCE DAILY.  Marland Kitchen gabapentin (NEURONTIN) 100 MG capsule  TAKE 1 CAPSULE BY MOUTH THREE TIMES A DAY.  Marland Kitchen HYDROcodone-acetaminophen (NORCO) 5-325 MG tablet Take 1 tablet by mouth every 6 (six) hours as needed for moderate pain. (Patient not taking: Reported on 08/29/2016)  . losartan (COZAAR) 50 MG tablet TAKE ONE TABLET BY MOUTH ONCE DAILY.  . metFORMIN (GLUCOPHAGE) 500 MG tablet TAKE (1) TABLET BY MOUTH TWICE DAILY.  . Multiple Vitamins-Minerals (MULTIVITAMIN ADULT PO) Take by mouth daily.   . potassium chloride SA (K-DUR,KLOR-CON) 20 MEQ tablet Take 1 tablet (20 mEq total) by mouth daily.   No facility-administered encounter medications on file as of 09/30/2016.     Allergy:  Allergies  Allergen Reactions  . Celebrex [Celecoxib]     GI upset   . Doxycycline Nausea Only    Gi upset  . Iron     Pt cannot tolerate some iron preparations. The yupset her stomach.  . Lodine [Etodolac] Nausea Only    Gi upset  . Nabumetone     (relafen)  . Tetracycline   . Xarelto [Rivaroxaban] Hives    Social Hx:   Social History   Social History  . Marital status: Married    Spouse name: N/A  . Number of children: 2  . Years of education: N/A   Occupational History  . Not on file.   Social History Main Topics  . Smoking status: Never Smoker  . Smokeless tobacco: Never Used  . Alcohol use No  . Drug use: No  . Sexual activity: Not on file   Other Topics Concern  . Not on file   Social History Narrative  . No narrative on file    Past Surgical Hx:  Past Surgical History:  Procedure Laterality Date  . ABDOMINAL HYSTERECTOMY     complete  . BACK SURGERY     upper  with plate frontal fusion and lower x 2  . COLONOSCOPY    . COLONOSCOPY N/A 05/05/2016   Procedure: COLONOSCOPY;  Surgeon: Daneil Dolin, MD;  [facility]: [facility];  Service: Endoscopy;  Laterality: N/A;  . ESOPHAGOGASTRODUODENOSCOPY N/A 05/04/2016   Procedure: ESOPHAGOGASTRODUODENOSCOPY (EGD);  Surgeon: Daneil Dolin, MD;  Location: [facility];  Service: Endoscopy;   Laterality: N/A;  . GIVENS CAPSULE STUDY N/A 05/06/2016   Procedure: GIVENS CAPSULE STUDY;  Surgeon: Daneil Dolin, MD;  Location: [facility];  Service: Endoscopy;  Laterality: N/A;  . KNEE SURGERY Left    arthroscopy  . RADICAL VULVECTOMY Left 04/09/2016   Procedure: RADICAL LEFT VULVECTOMY;  Surgeon: Everitt Amber, MD;  Location: [facility];  Service: Gynecology;  Laterality: Left;  . TOE SURGERY Left   . TONSILLECTOMY    . TOTAL HIP ARTHROPLASTY Left 10/27/2013   Procedure: LEFT TOTAL HIP ARTHROPLASTY;  Surgeon: Tobi Bastos, MD;  Location: [facility];  Service: Orthopedics;  Laterality: Left;  . TOTAL KNEE ARTHROPLASTY Right   . VULVA /PERINEUM BIOPSY  02/22/2016  . VULVECTOMY Right 02/27/2016   Procedure: RADICAL RIGHT VULVECTOMY WITH RIGHT INGUINAL LYMPH NODE DISSECTION;  Surgeon: Nancy Marus, MD;  Location: [facility];  Service: Gynecology;  Laterality: Right;  Past Medical Hx:  Past Medical History:  Diagnosis Date  . Aortic stenosis, mild   . Aortic valve stenosis, mild   . Arthritis   . Cancer ([place])    vulvar   . Complication of anesthesia   . Diabetes mellitus without complication ([place])    type 2  . Heart murmur   . History of blood transfusion age 78 or 58 months old  . History of radiation therapy 06/05/16 - 07/25/16   Pelvis treated to 45 Gy in 25 fractions, then Boosted an additional 18 Gy in 9 fractions.  . Hypertension   . IFG (impaired fasting glucose)   . PONV (postoperative nausea and vomiting)    years ago with back surgery-nausea  . Vitamin D deficiency     Past Gynecological History:  Remote hx of hysterectomy for fibroids. No hx of abnromal paps (last pap remote) No LMP recorded. Patient has had a hysterectomy.  Family Hx:  Family History  Problem Relation Age of Onset  . Hypertension Mother   . Hypertension Father   . Throat cancer Father   . Cancer Sister        Breast  . Hypertension Brother     Review of Systems:  Constitutional  Feels well,     ENT Normal appearing ears and nares bilaterally Skin/Breast  + vulvar pruritis. Cardiovascular  No chest pain, shortness of breath, or edema  Pulmonary  No cough or wheeze.  Gastro Intestinal  No nausea, vomitting, or diarrhoea. No bright red blood per rectum, no abdominal pain, change in bowel movement, or constipation.  Genito Urinary  No frequency, urgency, dysuria, no bleeding Musculo Skeletal  No myalgia, arthralgia, joint swelling or pain  Neurologic  No weakness, numbness, change in gait,  Psychology  No depression, anxiety, insomnia.   Vitals:  Blood pressure (!) 147/76, pulse 82, temperature 98.1 F (36.7 C), temperature source Oral, resp. rate 20, weight 196 lb 9.6 oz (89.2 kg), SpO2 96 %.  Physical Exam: WD in NAD Neck  Supple NROM, without any enlargements.  Lymph Node Survey No cervical supraclavicular or inguinal adenopathy. Right groin incision well healed.  Cardiovascular  Pulse normal rate, regularity and rhythm. S1 and S2 normal.  Lungs  Clear to auscultation bilateraly, without wheezes/crackles/rhonchi. Good air movement.  Skin  No rash/lesions/breakdown  Psychiatry  Alert and oriented to person, place, and time  Abdomen  Normoactive bowel sounds, abdomen soft, non-tender and obese without evidence of hernia.  Back No CVA tenderness Genito Urinary  Vulva/vagina: Radiation changes to vulva. 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 Suspicious for recurrence.  Bladder/urethra:  No lesions or masses, well supported bladder  Vagina: atrophic, no lesions  Cervix: surgically absent  Uterus: surgically absen  Adnexa: no palpable masses. Rectal  deferred Extremities  No bilateral cyanosis, clubbing or edema.  Procedure Note:  Verbal consent obtained Time out performed Betadine to swab vulva 1cc of 2% lidocaine infiltrated into anterior mons. tischler forcep used to take piece of  representative area of leukoplakia. Hemostasis obtained with silver nitrate. Specimen: anterior vulva to pathology  Donaciano Eva, MD  09/30/2016, 2:06 PM

## 2016-10-17 NOTE — Op Note (Signed)
PATIENT: [name] DATE: 10/17/16   Preop Diagnosis: recurrent vulvar cancer  Postoperative Diagnosis: same  Surgery: Partial radical anterior vulvectomy  Surgeons:  Donaciano Eva, MD Assistant: Lahoma Crocker, MD (an MD assistant was necessary for tissue manipulation, retraction and positioning due to the complexity of the case and hospital policies).   Anesthesia: General   Anesthesiologist: Rose  Estimated blood loss: 100 ml  IVF:  200 ml   Urine output: 488 ml   Complications: None   Pathology: anterior vulva with marking stitch at anterior clitoral margin o'clock  Operative findings: 3cm raised vulvar cancer (leukoplakia) at midline and right vulva extending to within 0.5cm of anterior urethral meatus.  Procedure: The patient was identified in the preoperative holding area. Informed consent was signed on the chart. Patient was seen history was reviewed and exam was performed.   The patient was then taken to the operating room and placed in the supine position with SCD hose on. General anesthesia was then induced without difficulty. She was then placed in the dorsolithotomy position. The perineum was prepped with Betadine. The vagina was prepped with Betadine. The patient was then draped after the prep was dried. A Foley catheter was inserted into the bladder under sterile conditions.  Timeout was performed the patient, procedure, antibiotic, allergy, and length of procedure. The vulvar tissues were inspected for areas of acetowhite changes or leukoplakia. The lesion was identified and the marking pen was used to circumscribe the area with appropriate surgical margins. The margin at the anterior urethral margin was very close (0.5cm) and therefore a 1cm margin could not be obtained without compromising functional integrity of the urethra. The 15 blade scalpel was used to make an incision through the skin circumferentially as marked. The skin elipse was  grasped and was separated from the underlying deep dermal tissues to the level of the endopelvic fascia with the bovie device. After the specimen had been completely resected, it was oriented and marked at the anterior clitoral margin and placed pinned and oriented on a cork board. The bovie was used to obtain hemostasis at the surgical bed. The subcutaneous tissues were irrigated and made hemostatic.   The deep dermal layer was approximated with 2-0 or 3-0 vicryl mattress sutures to bring the skin edges into approximation and off tension. The wound was closed following langher's lines. The cutaneous layer was closed with interrupted 4-0 vicryl stitches and mattress sutures to ensure a tension free and hemostatic closure. The perineum was again irrigated. The foley was kept in situ due to the close approximation with the suture line and urethral meatus.  All instrument, suture, laparotomy, Ray-Tec, and needle counts were correct x2. The patient tolerated the procedure well and was taken recovery room in stable condition. This is Everitt Amber dictating an operative note on Cherly Erno.

## 2016-10-17 NOTE — Interval H&P Note (Signed)
History and Physical Interval Note:  10/17/2016 11:17 AM  [name]  has presented today for surgery, with the diagnosis of VULVAR CANCER  The various methods of treatment have been discussed with the patient and family. After consideration of risks, benefits and other options for treatment, the patient has consented to  Procedure(s): VULVECTOMY PARTIAL (N/A) as a surgical intervention .  The patient's history has been reviewed, patient examined, no change in status, stable for surgery.  I have reviewed the patient's chart and labs.  Questions were answered to the patient's satisfaction.     Donaciano Eva

## 2016-10-17 NOTE — Anesthesia Preprocedure Evaluation (Signed)
Anesthesia Evaluation  Patient identified by MRN, date of birth, ID band Patient awake    Reviewed: Allergy & Precautions, NPO status , Patient's Chart, lab work & pertinent test results  Airway Mallampati: II  TM Distance: >3 FB Neck ROM: Full    Dental no notable dental hx.    Pulmonary neg pulmonary ROS,    Pulmonary exam normal breath sounds clear to auscultation       Cardiovascular hypertension, Normal cardiovascular exam+ Valvular Problems/Murmurs AS  Rhythm:Regular Rate:Normal     Neuro/Psych negative neurological ROS  negative psych ROS   GI/Hepatic negative GI ROS, Neg liver ROS,   Endo/Other  diabetes  Renal/GU negative Renal ROS  negative genitourinary   Musculoskeletal negative musculoskeletal ROS (+)   Abdominal   Peds negative pediatric ROS (+)  Hematology  (+) anemia ,   Anesthesia Other Findings   Reproductive/Obstetrics negative OB ROS                             Anesthesia Physical Anesthesia Plan  ASA: III  Anesthesia Plan: General   Post-op Pain Management:    Induction: Intravenous  PONV Risk Score and Plan: 2 and Ondansetron, Dexamethasone and Treatment may vary due to age or medical condition  Airway Management Planned: LMA  Additional Equipment:   Intra-op Plan:   Post-operative Plan: Extubation in OR  Informed Consent: I have reviewed the patients History and Physical, chart, labs and discussed the procedure including the risks, benefits and alternatives for the proposed anesthesia with the patient or authorized representative who has indicated his/her understanding and acceptance.   Dental advisory given  Plan Discussed with: CRNA and Surgeon  Anesthesia Plan Comments:         Anesthesia Quick Evaluation

## 2016-10-18 ENCOUNTER — Encounter (HOSPITAL_COMMUNITY): Payer: Self-pay | Admitting: Gynecologic Oncology

## 2016-10-18 DIAGNOSIS — C519 Malignant neoplasm of vulva, unspecified: Secondary | ICD-10-CM | POA: Diagnosis not present

## 2016-10-18 LAB — GLUCOSE, CAPILLARY: Glucose-Capillary: 107 mg/dL — ABNORMAL HIGH (ref 65–99)

## 2016-10-18 MED ORDER — OXYCODONE-ACETAMINOPHEN 5-325 MG PO TABS: 1.0000 | ORAL_TABLET | ORAL | 0 refills | Status: DC | PRN

## 2016-10-18 NOTE — Progress Notes (Signed)
Patient discharged to home with family. Given all belongings, instructions, prescriptions, equipment. Family present for education. Verbalized understanding of all instructions. Escorted to POV via w/c.

## 2016-10-18 NOTE — Discharge Instructions (Addendum)
Vulvectomy, Care After °The vulva is the external female genitalia, outside and around the vagina and pubic bone. It consists of: °· The skin on, and in front of, the pubic bone. °· The clitoris. °· The labia majora (large lips) on the outside of the vagina. °· The labia minora (small lips) around the opening of the vagina. °· The opening and the skin in and around the vagina. °A vulvectomy is the removal of the tissue of the vulva, which sometimes includes removal of the lymph nodes and tissue in the groin areas. °These discharge instructions provide you with general information on caring for yourself after you leave the hospital. It is also important that you know the warning signs of complications, so that you can seek treatment. Please read the instructions outlined below and refer to this sheet in the next few weeks. Your caregiver may also give you specific information and medicines. If you have any questions or complications after discharge, please call your caregiver. °ACTIVITY °· Rest as much as possible the first two weeks after discharge. °· Arrange to have help from family or others with your daily activities when you go home. °· Avoid heavy lifting (more than 5 pounds), pushing, or pulling. °· If you feel tired, balance your activity with rest periods. °· Follow your caregiver's instruction about climbing stairs and driving a car. °· Increase activity gradually. °· Do not exercise until you have permission from your caregiver. °LEG AND FOOT CARE °If your doctor has removed lymph nodes from your groin area, there may be an increase in swelling of your legs and feet. You can help prevent swelling by doing the following: °· Elevate your legs while sitting or lying down. °· If your caregiver has ordered special stockings, wear them according to instructions. °· Avoid standing in one place for long periods of time. °· Call the physical therapy department if you have any questions about swelling or treatment  for swelling. °· Avoid salt in your diet. It can cause fluid retention and swelling. °· Do not Donalee Gaumond your legs, especially when sitting. °NUTRITION °· You may resume your normal diet. °· Drink 6 to 8 glasses of fluids a day. °· Eat a healthy, balanced diet including portions of food from the meat (protein), milk, fruit, vegetable, and bread groups. °· Your caregiver may recommend you take a multivitamin with iron. °ELIMINATION °· You may notice that your stream of urine is at a different angle, and may tend to spray. Using a plastic funnel may help to decrease urine spray. °· If constipation occurs, drink more liquids, and add more fruits, vegetables, and bran to your diet. You may take a mild laxative, such as Milk of Magnesia, Metamucil, or a stool softener such as Colace, with permission from your caregiver. °HYGIENE °· You may shower and wash your hair. °· Check with your caregiver about tub baths. °· Do not add any bath oils or chemicals to your bath water, after you have permission to take baths. °· While passing urine, pour water from a bottle or spray over your vulva to dilute the urine as it passes the incision (this will decrease burning and discomfort). °· Clean yourself well after moving your bowels. °· After urinating, do not wipe. Dap or pat dry with toilet paper or a dry cleath soft cloth. °· A sitz bath will help keep your perineal area clean, reduce swelling, and provide comfort. °· Avoid wearing underpants for the first 2 weeks and wear loose skirts to   allow circulation of air around the incision  You do not need to apply dressings, salves or lotions to the wound.  The stitches are self-dissolving and will absorb and disappear over a couple of months (it is normal to notice the knot from the stitches on toilet paper after voiding). HOME CARE INSTRUCTIONS   Apply a soft ice pack (or frozen bag of peas) to your perineum (vulva) every hour in the first 48 hours after surgery. This will reduce  swelling.  Avoid activities that involve a lot of friction between your legs.  Avoid wearing pants or underpants in the 1st 2 weeks (skirts are preferable).  Take your temperature twice a day and record it, especially if you feel feverish or have chills.  Follow your caregiver's instructions about medicines, activity, and follow-up appointments after surgery.  Do not drink alcohol while taking pain medicine.  Change your dressing as advised by your caregiver.  You may take over-the-counter medicine for pain, recommended by your caregiver.  If your pain is not relieved with medicine, call your caregiver.  Do not take aspirin because it can cause bleeding.  Do not douche or use tampons (use a nonperfumed sanitary pad).  Do not have sexual intercourse until your caregiver gives you permission (typically 6 weeks postoperatively). Hugging, kissing, and playful sexual activity is fine with your caregiver's permission.  Warm sitz baths, with your caregiver's permission, are helpful to control swelling and discomfort.  Take showers instead of baths, until your caregiver gives you permission to take baths.  You may take a mild medicine for constipation, recommended by your caregiver. Bran foods and drinking a lot of fluids will help with constipation.  Make sure your family understands everything about your operation and recovery. SEEK MEDICAL CARE IF:   You notice swelling and redness around the wound area.  You notice a foul smell coming from the wound or on the surgical dressing.  You notice the wound is separating.  You have painful or bloody urination.  You develop nausea and vomiting.  You develop diarrhea.  You develop a rash.  You have a reaction or allergy from the medicine.  You feel dizzy or light-headed.  You need stronger pain medicine. SEEK IMMEDIATE MEDICAL CARE IF:   You develop a temperature of 102 F (38.9 C) or higher.  You pass out.  You develop  leg or chest pain.  You develop abdominal pain.  You develop shortness of breath.  You develop bleeding from the wound area.  You see pus in the wound area. MAKE SURE YOU:   Understand these instructions.  Will watch your condition.  Will get help right away if you are not doing well or get worse. Document Released: 08/22/2003 Document Revised: 05/24/2013 Document Reviewed: 12/09/2008 [facility] Patient Information 2015 [place], [place]. This information is not intended to replace advice given to you by your health care provider. Make sure you discuss any questions you have with your health care provider.  Do not take Percocet and Vicodin together.  Plan to follow up in the office in one week for foley removal.   Indwelling Urinary Catheter Care, Adult Take good care of your catheter to keep it working and to prevent problems. How to wear your catheter Attach your catheter to your leg with tape (adhesive tape) or a leg strap. Make sure it is not too tight. If you use tape, remove any bits of tape that are already on the catheter. How to wear a drainage bag  You should have:  A large overnight bag.  A small leg bag.  Overnight Bag You may wear the overnight bag at any time. Always keep the bag below the level of your bladder but off the floor. When you sleep, put a clean plastic bag in a wastebasket. Then hang the bag inside the wastebasket. Leg Bag Never wear the leg bag at night. Always wear the leg bag below your knee. Keep the leg bag secure with a leg strap or tape. How to care for your skin  Clean the skin around the catheter at least once every day.  Shower every day. Do not take baths.  Put creams, lotions, or ointments on your genital area only as told by your doctor.  Do not use powders, sprays, or lotions on your genital area. How to clean your catheter and your skin 1. Wash your hands with soap and water. 2. Wet a washcloth in warm water and gentle (mild)  soap. 3. Use the washcloth to clean the skin where the catheter enters your body. Clean downward and wipe away from the catheter in small circles. Do not wipe toward the catheter. 4. Pat the area dry with a clean towel. Make sure to clean off all soap. How to care for your drainage bags Empty your drainage bag when it is ?- full or at least 2-3 times a day. Replace your drainage bag once a month or sooner if it starts to smell bad or look dirty. Do not clean your drainage bag unless told by your doctor. Emptying a drainage bag  Supplies Needed  Rubbing alcohol.  Gauze pad or cotton ball.  Tape or a leg strap.  Steps 1. Wash your hands with soap and water. 2. Separate (detach) the bag from your leg. 3. Hold the bag over the toilet or a clean container. Keep the bag below your hips and bladder. This stops pee (urine) from going back into the tube. 4. Open the pour spout at the bottom of the bag. 5. Empty the pee into the toilet or container. Do not let the pour spout touch any surface. 6. Put rubbing alcohol on a gauze pad or cotton ball. 7. Use the gauze pad or cotton ball to clean the pour spout. 8. Close the pour spout. 9. Attach the bag to your leg with tape or a leg strap. 10. Wash your hands.  Changing a drainage bag Supplies Needed  Alcohol wipes.  A clean drainage bag.  Adhesive tape or a leg strap.  Steps 1. Wash your hands with soap and water. 2. Separate the dirty bag from your leg. 3. Pinch the rubber catheter with your fingers so that pee does not spill out. 4. Separate the catheter tube from the drainage tube where these tubes connect (at the connection valve). Do not let the tubes touch any surface. 5. Clean the end of the catheter tube with an alcohol wipe. Use a different alcohol wipe to clean the end of the drainage tube. 6. Connect the catheter tube to the drainage tube of the clean bag. 7. Attach the new bag to the leg with adhesive tape or a leg  strap. 8. Wash your hands.  How to prevent infection and other problems  Never pull on your catheter or try to remove it. Pulling can damage tissue in your body.  Always wash your hands before and after touching your catheter.  If a leg strap gets wet, replace it with a dry one.  Drink  enough fluids to keep your pee clear or pale yellow, or as told by your doctor.  Do not let the drainage bag or tubing touch the floor.  Wear cotton underwear.  If you are female, wipe from front to back after you poop (have a bowel movement).  Check on the catheter often to make sure it works and the tubing is not twisted. Get help if:  Your pee is cloudy.  Your pee smells unusually bad.  Your pee is not draining into the bag.  Your tube gets clogged.  Your catheter starts to leak.  Your bladder feels full. Get help right away if:  You have redness, swelling, or pain where the catheter enters your body.  You have fluid, pus, or a bad smell coming from the area where the catheter enters your body.  The area where the catheter enters your body feels warm.  You have a fever.  You have pain in your: ? Stomach (abdomen). ? Legs. ? Lower back. ? Bladder.  You see blood fill the catheter.  Your pee is pink or red.  You feel sick to your stomach (nauseous).  You throw up (vomit).  You have chills.  Your catheter gets pulled out. This information is not intended to replace advice given to you by your health care provider. Make sure you discuss any questions you have with your health care provider. Document Released: 05/04/2012 Document Revised: 12/06/2015 Document Reviewed: 06/22/2013 Elsevier Interactive Patient Education  [employer].

## 2016-10-18 NOTE — Discharge Summary (Signed)
Physician Discharge Summary  Patient ID: [name] MRN: [id number] DOB/AGE: January 26, 1940 76 y.o.  Admit date: 10/17/2016 Discharge date: 10/18/2016  Admission Diagnoses: Vulva cancer [facility])  Discharge Diagnoses:  Principal Problem:   Vulva cancer [facility])   Discharged Condition:  The patient is in good condition and stable for discharge.    Hospital Course: On 10/17/2016, the patient underwent the following: Procedure(s): RADICAL ANTERIOR VULVECTOMY.  The postoperative course was uneventful.  She was discharged to home on postoperative day 1 tolerating a regular diet, passing flatus, ambulating, with foley in place.  Instructed on foley care and leg bag prior to discharge.  Consults: None  Significant Diagnostic Studies: None  Treatments: surgery: see above  Discharge Exam: Blood pressure 116/68, pulse 75, temperature 98.1 F (36.7 C), temperature source Oral, resp. rate 16, height 5\' 4"  (1.626 m), weight 197 lb 1.5 oz (89.4 kg), SpO2 95 %. General appearance: alert, cooperative and no distress Resp: clear to auscultation bilaterally Cardio: systolic murmur noted, regular rate and rhythm GI: soft, non-tender; bowel sounds normal; no masses,  no organomegaly Extremities: extremities normal, atraumatic, no cyanosis or edema Incision/Wound: Vulvar incision intact. Foley in place.  Switched to new larger bag.  Disposition: 01-Home or Self Care  Discharge Instructions    Call MD for:  difficulty breathing, headache or visual disturbances    Complete by:  As directed    Call MD for:  extreme fatigue    Complete by:  As directed    Call MD for:  hives    Complete by:  As directed    Call MD for:  persistant dizziness or light-headedness    Complete by:  As directed    Call MD for:  persistant nausea and vomiting    Complete by:  As directed    Call MD for:  redness, tenderness, or signs of infection (pain, swelling, redness, odor or green/yellow discharge around incision site)     Complete by:  As directed    Call MD for:  severe uncontrolled pain    Complete by:  As directed    Call MD for:  temperature >100.4    Complete by:  As directed    Diet - low sodium heart healthy    Complete by:  As directed    Driving Restrictions    Complete by:  As directed    No driving for 1 week if cleared to drive previous.  Do not take narcotics and drive.   Increase activity slowly    Complete by:  As directed    Lifting restrictions    Complete by:  As directed    No lifting greater than 10 lbs.   Sexual Activity Restrictions    Complete by:  As directed    No sexual activity, nothing in the vagina, for 6 weeks.     Allergies as of 10/18/2016      Reactions   Celebrex [celecoxib] Other (See Comments)   GI upset   Doxycycline Nausea Only   Gi upset   Iron Other (See Comments)   Pt cannot tolerate some iron preparations. The upset her stomach.   Lodine [etodolac] Nausea Only   Gi upset   Nabumetone Other (See Comments)   (relafen) unknown   Tetracycline Nausea And Vomiting   Xarelto [rivaroxaban] Hives      Medication List    TAKE these medications   aspirin 81 MG tablet Take 81 mg by mouth every evening.   ferrous gluconate 225 (  27 Fe) MG tablet Commonly known as:  FERGON Take 240 mg by mouth daily.   furosemide 40 MG tablet Commonly known as:  LASIX TAKE ONE TABLET BY MOUTH ONCE DAILY.   gabapentin 100 MG capsule Commonly known as:  NEURONTIN TAKE 1 CAPSULE BY MOUTH THREE TIMES A DAY. What changed:  See the new instructions.   HYDROcodone-acetaminophen 5-325 MG tablet Commonly known as:  NORCO Take 1 tablet by mouth every 6 (six) hours as needed for moderate pain.   losartan 50 MG tablet Commonly known as:  COZAAR TAKE ONE TABLET BY MOUTH ONCE DAILY.   metFORMIN 500 MG tablet Commonly known as:  GLUCOPHAGE TAKE (1) TABLET BY MOUTH TWICE DAILY.   MULTIVITAMIN ADULT PO Take 1 tablet by mouth daily. Women's 50+    oxyCODONE-acetaminophen 5-325 MG tablet Commonly known as:  PERCOCET/ROXICET Take 1-2 tablets by mouth every 4 (four) hours as needed (moderate to severe pain). Do not take with Vicodin, Do not take and drive   potassium chloride SA 20 MEQ tablet Commonly known as:  K-DUR,KLOR-CON Take 1 tablet (20 mEq total) by mouth daily.            Discharge Care Instructions        Start     Ordered   10/18/16 0000  oxyCODONE-acetaminophen (PERCOCET/ROXICET) 5-325 MG tablet  Every 4 hours PRN     10/18/16 0950   10/18/16 0000  Increase activity slowly     10/18/16 0950   10/18/16 0000  Driving Restrictions    Comments:  No driving for 1 week if cleared to drive previous.  Do not take narcotics and drive.   56/31/49 7026   10/18/16 0000  Lifting restrictions    Comments:  No lifting greater than 10 lbs.   10/18/16 0950   10/18/16 0000  Diet - low sodium heart healthy     10/18/16 0950   10/18/16 0000  Call MD for:  temperature >100.4     10/18/16 0950   10/18/16 0000  Call MD for:  persistant nausea and vomiting     10/18/16 0950   10/18/16 0000  Call MD for:  severe uncontrolled pain     10/18/16 0950   10/18/16 0000  Call MD for:  redness, tenderness, or signs of infection (pain, swelling, redness, odor or green/yellow discharge around incision site)     10/18/16 0950   10/18/16 0000  Call MD for:  difficulty breathing, headache or visual disturbances     10/18/16 0950   10/18/16 0000  Call MD for:  hives     10/18/16 0950   10/18/16 0000  Call MD for:  persistant dizziness or light-headedness     10/18/16 0950   10/18/16 0000  Call MD for:  extreme fatigue     10/18/16 0950   10/18/16 0000  Sexual Activity Restrictions    Comments:  No sexual activity, nothing in the vagina, for 6 weeks.   10/18/16 0950       Greater than thirty minutes were spend for face to face discharge instructions and discharge orders/summary in EPIC.   Signed: Lanice Folden DEAL 10/18/2016,  9:51 AM

## 2016-10-24 ENCOUNTER — Encounter: Payer: Self-pay | Admitting: Internal Medicine

## 2016-10-24 ENCOUNTER — Ambulatory Visit: Payer: PPO | Attending: Gynecologic Oncology | Admitting: Gynecologic Oncology

## 2016-10-24 ENCOUNTER — Encounter: Payer: Self-pay | Admitting: Gynecologic Oncology

## 2016-10-24 VITALS — BP 124/71 | HR 99 | Temp 97.5°F | Resp 20

## 2016-10-24 DIAGNOSIS — Z923 Personal history of irradiation: Secondary | ICD-10-CM | POA: Insufficient documentation

## 2016-10-24 DIAGNOSIS — I10 Essential (primary) hypertension: Secondary | ICD-10-CM | POA: Insufficient documentation

## 2016-10-24 DIAGNOSIS — Z7982 Long term (current) use of aspirin: Secondary | ICD-10-CM | POA: Insufficient documentation

## 2016-10-24 DIAGNOSIS — E119 Type 2 diabetes mellitus without complications: Secondary | ICD-10-CM | POA: Insufficient documentation

## 2016-10-24 DIAGNOSIS — Z79899 Other long term (current) drug therapy: Secondary | ICD-10-CM | POA: Insufficient documentation

## 2016-10-24 DIAGNOSIS — I350 Nonrheumatic aortic (valve) stenosis: Secondary | ICD-10-CM | POA: Insufficient documentation

## 2016-10-24 DIAGNOSIS — Z7984 Long term (current) use of oral hypoglycemic drugs: Secondary | ICD-10-CM | POA: Diagnosis not present

## 2016-10-24 DIAGNOSIS — C519 Malignant neoplasm of vulva, unspecified: Secondary | ICD-10-CM

## 2016-10-24 DIAGNOSIS — C511 Malignant neoplasm of labium minus: Secondary | ICD-10-CM | POA: Diagnosis not present

## 2016-10-24 NOTE — Patient Instructions (Signed)
Doing well.  Plan to follow up as scheduled or sooner if needed.  Please call for any difficulty urinating.

## 2016-10-31 ENCOUNTER — Ambulatory Visit: Payer: PPO | Admitting: Gynecologic Oncology

## 2016-11-04 ENCOUNTER — Encounter: Payer: Self-pay | Admitting: Gynecologic Oncology

## 2016-11-04 ENCOUNTER — Ambulatory Visit: Payer: PPO | Attending: Gynecologic Oncology | Admitting: Gynecologic Oncology

## 2016-11-04 VITALS — BP 138/63 | HR 78 | Temp 98.2°F | Resp 20 | Wt 193.6 lb

## 2016-11-04 DIAGNOSIS — I10 Essential (primary) hypertension: Secondary | ICD-10-CM | POA: Diagnosis not present

## 2016-11-04 DIAGNOSIS — E119 Type 2 diabetes mellitus without complications: Secondary | ICD-10-CM | POA: Diagnosis not present

## 2016-11-04 DIAGNOSIS — Z7984 Long term (current) use of oral hypoglycemic drugs: Secondary | ICD-10-CM | POA: Diagnosis not present

## 2016-11-04 DIAGNOSIS — Z881 Allergy status to other antibiotic agents status: Secondary | ICD-10-CM | POA: Insufficient documentation

## 2016-11-04 DIAGNOSIS — C519 Malignant neoplasm of vulva, unspecified: Secondary | ICD-10-CM

## 2016-11-04 DIAGNOSIS — Z923 Personal history of irradiation: Secondary | ICD-10-CM | POA: Diagnosis not present

## 2016-11-04 DIAGNOSIS — R918 Other nonspecific abnormal finding of lung field: Secondary | ICD-10-CM | POA: Diagnosis not present

## 2016-11-04 DIAGNOSIS — Z7189 Other specified counseling: Secondary | ICD-10-CM

## 2016-11-04 DIAGNOSIS — Z7982 Long term (current) use of aspirin: Secondary | ICD-10-CM | POA: Diagnosis not present

## 2016-11-04 DIAGNOSIS — I350 Nonrheumatic aortic (valve) stenosis: Secondary | ICD-10-CM | POA: Diagnosis not present

## 2016-11-04 NOTE — Patient Instructions (Signed)
Please notify Dr Denman George at phone number [phone] if you notice vaginal bleeding, vaginal itch, or a change in bladder or bowel function.   Try using A&D ointment on the skin of the vulva which is irritated by use of pads. Apply this before replacing new pads to create a barrier.  Please return to see Dr Denman George in January, 2019. You have an appointment with Dr Sondra Come in December, 2018.

## 2016-11-04 NOTE — Progress Notes (Signed)
Consult Note: Gyn-Onc  Consult was requested by Dr. Elonda Husky for the evaluation of [name] 76 y.o. female   CC:  Chief Complaint  Patient presents with  . Vulvar cancer [facility])    Assessment/Plan:  Ms. [name]  is a 76 y.o.  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 S/p adjuvant vulvar and inguinal (bilateral) radiation. Close urethral margin on recurrent carcinoma specimen.  Recommend close surveillance - will see her again in 3 months.  Chest nodules on pretreatment CT scan - repeat CT.   HPI: [name] is a 76 year old woman who is seen in consultation at the request of Dr Elonda Husky for a right labial lesion.    The patient reports that she began feeling pruritis of the vulva in October, 2017. She was seen by her PCP who saw no lesion and prescribed estradiol.  The pruritis persisted and became worse and she began to feel a nodule in the area and therefore again saw her PCP followed by a referral to gynecologist, Dr Elonda Husky on 02/20/16. The nodular lesion was appreciated on the right vulva. No biopsy was taken at that time. Biopsy was performed on 02/22/16 and pathology from biopsy confirmed invasive SCC.   CT scan abdo/pelvis was negative for evidence of suspicious nodes or distant mets.  On 02/22/16 she underwent right radical vulvectomy and right inguinal lymphadenectomy with Dr Nancy Marus. Final pathology revealed: a 2.1cm moderately differentiated SCC of the right vulva. Margins were negative (closest was 3 o'clock, 74mm). Depth of invasion was 38mm. 1 of 6 inguinofemoral lymph nodes (right) was positive for metastatic disease.   CT chest on 04/01/16 showed: Multiple pulmonary nodules as detailed above, generally in the 56mm and less average diameter range although with 1 right lower lobe subpleural nodule measuring about 8 mm in diameter. These likely warrant surveillance. Hypodense right  thyroid nodule, 1.1 cm in long axis. Consider further evaluation with thyroid ultrasound. If patient is clinically hyperthyroid, consider nuclear medicine thyroid uptake and scan. Stable hypodense lateral splenic lesion, likely a cyst.  Postoperatively after her first vulvectomy she developed irritation on the left vulvar lip. She saw [place] for evaluation in March and complained of this irritation. A nodular lesion was seen in the mid portion of the left labia minora in a "kissing" location to where the primary lesion had been. It was biopsied and confirmed as recurrent SCC of the vulva.  On 04/09/16 she was again taken to the OR, this time for a radical left vulvectomy. A decision was made to not perform left inguinal lymphadenectomy, so as to not delay radiation further (with an additional groin wound to heal), but to instead extend the radiation to both groins.  Pathology from this surgery confirmed a poorly differentiated squamous cell carcinoma of the left vulva with 70mm of invasion. The inferior 6 o'clock margin was "broadly" positive (though macroscopically this margin was grossly negative.  She was recommended to receive adjuvant radiation to the vulva and groins. Radiation treatment dates of 06/05/16-07/25/16 where she received 45Gy in 28fractions directed at the bilateral inguinal and vulvar area. The patient then proceeded with a boost to the vulvar region the light of the positive surgical margin and continue to a dose of 63 gray.  She developed burning and itching on the anterior right vulva in August, 2018 and a 2cm lesion was seen on the anterior vulva approximating the urethral meatus.  It was biopsied in September, 2018 and this confirmed recurrent SCC.  On 10/17/16 she underwent radical anterior vulvectomy. A 3cm raised vulvar lesion was seen at the midline and right vulva extending to within 0.5cm (macroscopically) to the urethral meatus.  Final pathology confirmed SCC, 2.7cm with 32mm  invasion, negative magins though there was a very close (56mm) urethral margin.  Interval Hx:  Since surgery she has been healing well. She has some irritation of where her skin contacts the perineal pads and some drainage. However, she is voiding without difficulty and denies urinary incontinence.  Current Meds:  Outpatient Encounter Prescriptions as of 11/04/2016  Medication Sig  . aspirin 81 MG tablet Take 81 mg by mouth every evening.   . ferrous gluconate (FERGON) 225 (27 Fe) MG tablet Take 240 mg by mouth daily.  . furosemide (LASIX) 40 MG tablet TAKE ONE TABLET BY MOUTH ONCE DAILY.  Marland Kitchen gabapentin (NEURONTIN) 100 MG capsule Take 100 mg by mouth 2 (two) times daily.  Marland Kitchen HYDROcodone-acetaminophen (NORCO) 5-325 MG tablet Take 1 tablet by mouth every 6 (six) hours as needed for moderate pain.  Marland Kitchen losartan (COZAAR) 50 MG tablet TAKE ONE TABLET BY MOUTH ONCE DAILY.  . metFORMIN (GLUCOPHAGE) 500 MG tablet TAKE (1) TABLET BY MOUTH TWICE DAILY.  . Multiple Vitamins-Minerals (MULTIVITAMIN ADULT PO) Take 1 tablet by mouth daily. Women's 50+  . potassium chloride SA (K-DUR,KLOR-CON) 20 MEQ tablet Take 1 tablet (20 mEq total) by mouth daily.   No facility-administered encounter medications on file as of 11/04/2016.     Allergy:  Allergies  Allergen Reactions  . Oxycodone Other (See Comments)    Intense H/A  . Celebrex [Celecoxib] Other (See Comments)    GI upset   . Doxycycline Nausea Only    Gi upset  . Iron Other (See Comments)    Pt cannot tolerate some iron preparations. The upset her stomach.  . Lodine [Etodolac] Nausea Only    Gi upset  . Nabumetone Other (See Comments)    (relafen) unknown  . Tetracycline Nausea And Vomiting  . Xarelto [Rivaroxaban] Hives    Social Hx:   Social History   Social History  . Marital status: Married    Spouse name: N/A  . Number of children: 2  . Years of education: N/A   Occupational History  . Not on file.   Social History Main Topics   . Smoking status: Never Smoker  . Smokeless tobacco: Never Used  . Alcohol use No  . Drug use: No  . Sexual activity: Not on file   Other Topics Concern  . Not on file   Social History Narrative  . No narrative on file    Past Surgical Hx:  Past Surgical History:  Procedure Laterality Date  . ABDOMINAL HYSTERECTOMY     complete  . BACK SURGERY     upper  with plate frontal fusion and lower x 2  . COLONOSCOPY    . COLONOSCOPY N/A 05/05/2016   Procedure: COLONOSCOPY;  Surgeon: Daneil Dolin, MD;  [facility];  Service: Endoscopy;  Laterality: N/A;  . ESOPHAGOGASTRODUODENOSCOPY N/A 05/04/2016   Procedure: ESOPHAGOGASTRODUODENOSCOPY (EGD);  Surgeon: Daneil Dolin, MD;  [facility];  Service: Endoscopy;  Laterality: N/A;  . GIVENS CAPSULE STUDY N/A 05/06/2016   Procedure: GIVENS CAPSULE STUDY;  Surgeon: Daneil Dolin, MD;  [facility];  Service: Endoscopy;  Laterality: N/A;  . KNEE SURGERY Left    arthroscopy  .  RADICAL VULVECTOMY Left 04/09/2016   Procedure: RADICAL LEFT VULVECTOMY;  Surgeon: Everitt Amber, MD;  [facility];  Service: Gynecology;  Laterality: Left;  . RADICAL VULVECTOMY  10/17/2016   Procedure: RADICAL ANTERIOR VULVECTOMY;  Surgeon: Everitt Amber, MD;  [facility];  Service: Gynecology;;  . TOE SURGERY Left   . TONSILLECTOMY    . TOTAL HIP ARTHROPLASTY Left 10/27/2013   Procedure: LEFT TOTAL HIP ARTHROPLASTY;  Surgeon: Tobi Bastos, MD;  [facility];  Service: Orthopedics;  Laterality: Left;  . TOTAL KNEE ARTHROPLASTY Right   . VULVA /PERINEUM BIOPSY  02/22/2016  . VULVECTOMY Right 02/27/2016   Procedure: RADICAL RIGHT VULVECTOMY WITH RIGHT INGUINAL LYMPH NODE DISSECTION;  Surgeon: Nancy Marus, MD;  [facility];  Service: Gynecology;  Laterality: Right;    Past Medical Hx:  Past Medical History:  Diagnosis Date  . Aortic stenosis, mild   . Aortic valve stenosis, mild   . Arthritis   . Cancer ([place])     vulvar   . Complication of anesthesia   . Diabetes mellitus without complication ([place])    type 2  . Heart murmur   . History of blood transfusion age 30 or 39 months old  . History of radiation therapy 06/05/16 - 07/25/16   Pelvis treated to 45 Gy in 25 fractions, then Boosted an additional 18 Gy in 9 fractions.  . Hypertension   . IFG (impaired fasting glucose)   . PONV (postoperative nausea and vomiting)    years ago with back surgery-nausea  . Vitamin D deficiency     Past Gynecological History:  Remote hx of hysterectomy for fibroids. No hx of abnromal paps (last pap remote) No LMP recorded. Patient has had a hysterectomy.  Family Hx:  Family History  Problem Relation Age of Onset  . Hypertension Mother   . Hypertension Father   . Throat cancer Father   . Cancer Sister        Breast  . Hypertension Brother     Review of Systems:  Constitutional  Feels well,    ENT Normal appearing ears and nares bilaterally Skin/Breast  + vulvar pruritis. Cardiovascular  No chest pain, shortness of breath, or edema  Pulmonary  No cough or wheeze.  Gastro Intestinal  No nausea, vomitting, or diarrhoea. No bright red blood per rectum, no abdominal pain, change in bowel movement, or constipation.  Genito Urinary  No frequency, urgency, dysuria, no bleeding Musculo Skeletal  No myalgia, arthralgia, joint swelling or pain  Neurologic  No weakness, numbness, change in gait,  Psychology  No depression, anxiety, insomnia.   Vitals:  Blood pressure 138/63, pulse 78, temperature 98.2 F (36.8 C), temperature source Oral, resp. rate 20, weight 193 lb 9.6 oz (87.8 kg), SpO2 97 %.  Physical Exam: WD in NAD Neck  Supple NROM, without any enlargements.  Lymph Node Survey No cervical supraclavicular or inguinal adenopathy. Right groin incision well healed.  Cardiovascular  Pulse normal rate, regularity and rhythm. S1 and S2 normal.  Lungs  Clear to auscultation bilateraly, without  wheezes/crackles/rhonchi. Good air movement.  Skin  No rash/lesions/breakdown  Psychiatry  Alert and oriented to person, place, and time  Abdomen  Normoactive bowel sounds, abdomen soft, non-tender and obese without evidence of hernia.  Back No CVA tenderness Genito Urinary  Vulva/vagina: Radiation changes present. Anterior mons incision in tact, wound separation of peri-urethral incision but clean granulating base, no evidence of cellulitis, no evidence of recurrent tumor.  Bladder/urethra:  No lesions or masses, well supported bladder  Vagina: atrophic, no lesions  Cervix: surgically absent  Uterus: surgically absen  Adnexa: no palpable masses. Rectal  deferred Extremities  No bilateral cyanosis, clubbing or edema.   20 minutes of direct face to face counseling time was spent with the patient. This included discussion about prognosis, therapy recommendations and postoperative side effects and are beyond the scope of routine postoperative care.  Donaciano Eva, MD  11/04/2016, 11:08 AM

## 2016-11-06 ENCOUNTER — Ambulatory Visit (INDEPENDENT_AMBULATORY_CARE_PROVIDER_SITE_OTHER): Payer: PPO | Admitting: Nurse Practitioner

## 2016-11-06 ENCOUNTER — Encounter: Payer: Self-pay | Admitting: Nurse Practitioner

## 2016-11-06 VITALS — BP 118/76 | Temp 98.4°F | Ht 64.0 in | Wt 194.0 lb

## 2016-11-06 DIAGNOSIS — J209 Acute bronchitis, unspecified: Secondary | ICD-10-CM

## 2016-11-06 DIAGNOSIS — J069 Acute upper respiratory infection, unspecified: Secondary | ICD-10-CM | POA: Diagnosis not present

## 2016-11-06 MED ORDER — AMOXICILLIN-POT CLAVULANATE 875-125 MG PO TABS: 1.0000 | ORAL_TABLET | Freq: Two times a day (BID) | ORAL | 0 refills | Status: DC

## 2016-11-06 NOTE — Progress Notes (Signed)
Subjective:  Presents for c/o chest congestion for the past 2 days. Possible fever last night. Burning in the throat. Frontal area headache. Runny nose. Slight cough. Slight "wheezing" in the upper anterior chest yesterday am, none today. Bilateral ear pain.   Objective:   BP 118/76   Temp 98.4 F (36.9 C) (Oral)   Ht 5\' 4"  (1.626 m)   Wt 194 lb (88 kg)   BMI 33.30 kg/m  NAD. Alert, oriented. TMs clear effusion. Pharynx non erythematous with green PND noted. Neck supple with mild anterior adenopathy. Lungs scattered expiratory crackles upper lobes. No wheezing or tachypnea. Good airflow. Heart RRR.   Assessment:  Acute upper respiratory infection  Acute bronchitis, unspecified organism    Plan:   Meds ordered this encounter  Medications  . amoxicillin-clavulanate (AUGMENTIN) 875-125 MG tablet    Sig: Take 1 tablet by mouth 2 (two) times daily.    Dispense:  20 tablet    Refill:  0    Order Specific Question:   Supervising Provider    Answer:   Mikey Kirschner [[id number]]   OTC meds as directed. Warning signs reviewed. Call back if worsens or persists.

## 2016-11-08 ENCOUNTER — Encounter: Payer: Self-pay | Admitting: Gynecologic Oncology

## 2016-11-08 NOTE — Progress Notes (Signed)
Follow Up Note: Gyn-Onc  [name] 76 y.o. female  CC:  Chief Complaint  Patient presents with  . Vulvar cancer ([place])    follow up post-op    HPI: [name] is a 76 year old woman initially seen in consultation at the request of Dr Elonda Husky for a right labial lesion.  In October 2017, she noted pruritis of the vulva. She sought care with her PCP, who reported no lesion and prescribed estradiol.  The pruritis persisted and became worse.  She also noted a nodule in the area so she sought care with her PCP again with a referral to gynecologist, Dr Elonda Husky on 02/20/16 to follow. The nodular lesion was appreciated on the right vulva. No biopsy was taken at that time. Biopsy was performed on 02/22/16 and pathology from biopsy confirmed invasive SCC. CT scan abdo/pelvis was negative for evidence of suspicious nodes or distant mets.  On 02/22/16, she underwent right radical vulvectomy and right inguinal lymphadenectomy with Dr Nancy Marus. Final pathology revealed: a 2.1cm moderately differentiated SCC of the right vulva. Margins were negative (closest was 3 o'clock, 73mm). Depth of invasion was 39mm. 1 of 6 inguinofemoral lymph nodes (right) was positive for metastatic disease.   CT chest on 04/01/16 showed: Multiple pulmonary nodules as detailed above, generally in the 66mm and less average diameter range although with 1 right lower lobe subpleural nodule measuring about 8 mm in diameter. These likely warrant surveillance. Hypodense right thyroid nodule, 1.1 cm in long axis. Consider further evaluation with thyroid ultrasound. If patient is clinically hyperthyroid, consider nuclear medicine thyroid uptake and scan. Stable hypodense lateral splenic lesion, likely a cyst.  Postoperatively after her first vulvectomy she developed irritation on the left vulvar lip. She saw seen for evaluation in March and reported the irritation. A nodular lesion was seen in the mid portion of the left labia minora in a  "kissing" location to where the primary lesion had been. It was biopsied and confirmed as recurrent SCC of the vulva.  On 04/09/16 she was again taken to the OR, this time for a radical left vulvectomy. A decision was made to not perform left inguinal lymphadenectomy, so as to not delay radiation further (with an additional groin wound to heal), but to instead extend the radiation to both groins.  Pathology confirmed a poorly differentiated squamous cell carcinoma of the left vulva with 68mm of invasion. The inferior 6 o'clock margin was "broadly" positive (though macroscopically this margin was grossly negative).  She was recommended to receive adjuvant radiation to the vulva and groins. Radiation treatment dates of 06/05/16-07/25/16 where she received45Gy in 39fractions directed at the bilateral inguinal and vulvar area. The patient then proceededwith a boost to the vulvar region the light of the positive surgical margin and continue to a dose of 63 gray.  She developed burning and itching on the anterior right vulva in August, 2018 and a 2cm lesion was seen on the anterior vulva approximating the urethral meatus. It was biopsied in September, 2018 and this confirmed recurrent SCC.  On 10/17/16 she underwent radical anterior vulvectomy. A 3cm raised vulvar lesion was seen at the midline and right vulva extending to within 0.5cm (macroscopically) to the urethral meatus.  Final pathology confirmed SCC, 2.7cm with 73mm invasion, negative magins though there was a very close (36mm) urethral margin.  Interval History:  She presents today for post-operative follow and foley removal.  She reports doing well since surgery and is ready to have the  catheter out.  No significant pain or drainage reported from the vulvar incision.  She is ambulating without difficulty.  Tolerating diet with no nausea or emesis.  Her bowels are functioning without difficulty.  She states her foley has been draining clear,  yellow urine.  No concern voiced.  Review of Systems  Constitutional: Feels well.  No fever, chills, early satiety, change in weight or appetite.    Cardiovascular: No chest pain, shortness of breath, or edema.  Pulmonary: No cough or wheeze.  Gastrointestinal: No nausea, vomiting, or diarrhea. No bright red blood per rectum or change in bowel movement.  Genitourinary: (Foley in place). No vaginal bleeding or discharge.  Musculoskeletal: No myalgia or joint pain Neurologic: No weakness, numbness, or change in gait.  Psychology: No depression, anxiety, or insomnia.  Current Meds:  Outpatient Encounter Prescriptions as of 10/24/2016  Medication Sig  . aspirin 81 MG tablet Take 81 mg by mouth every evening.   . ferrous gluconate (FERGON) 225 (27 Fe) MG tablet Take 240 mg by mouth daily.  . furosemide (LASIX) 40 MG tablet TAKE ONE TABLET BY MOUTH ONCE DAILY.  Marland Kitchen gabapentin (NEURONTIN) 100 MG capsule Take 100 mg by mouth 2 (two) times daily.  Marland Kitchen HYDROcodone-acetaminophen (NORCO) 5-325 MG tablet Take 1 tablet by mouth every 6 (six) hours as needed for moderate pain.  Marland Kitchen losartan (COZAAR) 50 MG tablet TAKE ONE TABLET BY MOUTH ONCE DAILY.  . metFORMIN (GLUCOPHAGE) 500 MG tablet TAKE (1) TABLET BY MOUTH TWICE DAILY.  . Multiple Vitamins-Minerals (MULTIVITAMIN ADULT PO) Take 1 tablet by mouth daily. Women's 50+  . potassium chloride SA (K-DUR,KLOR-CON) 20 MEQ tablet Take 1 tablet (20 mEq total) by mouth daily.  . [DISCONTINUED] gabapentin (NEURONTIN) 100 MG capsule TAKE 1 CAPSULE BY MOUTH THREE TIMES A DAY. (Patient taking differently: TAKE 1 CAPSULE BY MOUTH TWO TIMES A DAY.)  . [DISCONTINUED] oxyCODONE-acetaminophen (PERCOCET/ROXICET) 5-325 MG tablet Take 1-2 tablets by mouth every 4 (four) hours as needed (moderate to severe pain). Do not take with Vicodin, Do not take and drive (Patient not taking: Reported on 10/24/2016)   No facility-administered encounter medications on file as of 10/24/2016.      Allergy:  Allergies  Allergen Reactions  . Oxycodone Other (See Comments)    Intense H/A  . Celebrex [Celecoxib] Other (See Comments)    GI upset   . Doxycycline Nausea Only    Gi upset  . Iron Other (See Comments)    Pt cannot tolerate some iron preparations. The upset her stomach.  . Lodine [Etodolac] Nausea Only    Gi upset  . Nabumetone Other (See Comments)    (relafen) unknown  . Tetracycline Nausea And Vomiting  . Xarelto [Rivaroxaban] Hives    Social Hx:   Social History   Social History  . Marital status: Married    Spouse name: N/A  . Number of children: 2  . Years of education: N/A   Occupational History  . Not on file.   Social History Main Topics  . Smoking status: Never Smoker  . Smokeless tobacco: Never Used  . Alcohol use No  . Drug use: No  . Sexual activity: Not on file   Other Topics Concern  . Not on file   Social History Narrative  . No narrative on file    Past Surgical Hx:  Past Surgical History:  Procedure Laterality Date  . ABDOMINAL HYSTERECTOMY     complete  . BACK SURGERY  upper  with plate frontal fusion and lower x 2  . COLONOSCOPY    . COLONOSCOPY N/A 05/05/2016   Procedure: COLONOSCOPY;  Surgeon: Daneil Dolin, MD;  [facility];  Service: Endoscopy;  Laterality: N/A;  . ESOPHAGOGASTRODUODENOSCOPY N/A 05/04/2016   Procedure: ESOPHAGOGASTRODUODENOSCOPY (EGD);  Surgeon: Daneil Dolin, MD;  [facility];  Service: Endoscopy;  Laterality: N/A;  . GIVENS CAPSULE STUDY N/A 05/06/2016   Procedure: GIVENS CAPSULE STUDY;  Surgeon: Daneil Dolin, MD;  [facility];  Service: Endoscopy;  Laterality: N/A;  . KNEE SURGERY Left    arthroscopy  . RADICAL VULVECTOMY Left 04/09/2016   Procedure: RADICAL LEFT VULVECTOMY;  Surgeon: Everitt Amber, MD;  [facility];  Service: Gynecology;  Laterality: Left;  . RADICAL VULVECTOMY  10/17/2016   Procedure: RADICAL ANTERIOR VULVECTOMY;  Surgeon: Everitt Amber, MD;  [facility];  Service: Gynecology;;  . TOE SURGERY Left   . TONSILLECTOMY    . TOTAL HIP ARTHROPLASTY Left 10/27/2013   Procedure: LEFT TOTAL HIP ARTHROPLASTY;  Surgeon: Tobi Bastos, MD;  [facility];  Service: Orthopedics;  Laterality: Left;  . TOTAL KNEE ARTHROPLASTY Right   . VULVA /PERINEUM BIOPSY  02/22/2016  . VULVECTOMY Right 02/27/2016   Procedure: RADICAL RIGHT VULVECTOMY WITH RIGHT INGUINAL LYMPH NODE DISSECTION;  Surgeon: Nancy Marus, MD;  [facility];  Service: Gynecology;  Laterality: Right;    Past Medical Hx:  Past Medical History:  Diagnosis Date  . Aortic stenosis, mild   . Aortic valve stenosis, mild   . Arthritis   . Cancer ([place])    vulvar   . Complication of anesthesia   . Diabetes mellitus without complication ([place])    type 2  . Heart murmur   . History of blood transfusion age 35 or 6 months old  . History of radiation therapy 06/05/16 - 07/25/16   Pelvis treated to 45 Gy in 25 fractions, then Boosted an additional 18 Gy in 9 fractions.  . Hypertension   . IFG (impaired fasting glucose)   . PONV (postoperative nausea and vomiting)    years ago with back surgery-nausea  . Vitamin D deficiency     Family Hx:  Family History  Problem Relation Age of Onset  . Hypertension Mother   . Hypertension Father   . Throat cancer Father   . Cancer Sister        Breast  . Hypertension Brother     Vitals:  Blood pressure 124/71, pulse 99, temperature (!) 97.5 F (36.4 C), temperature source Oral, resp. rate 20, SpO2 95 %.  Physical Exam:  General: Well developed, well nourished female in no acute distress. Alert and oriented x 3.  Cardiovascular: Systolic murmur present. Regular rate and rhythm. S1 and S2 normal.  Lungs: Clear to auscultation bilaterally. No wheezes/crackles/rhonchi noted.  Skin: No rashes or lesions present. Back: No CVA tenderness.  Abdomen: Abdomen soft, non-tender and obese. Active bowel sounds in all quadrants.  No evidence of a fluid wave or abdominal masses.  Genitourinary:    Vulva/vagina:  Incision intact to the anterior mons and peri-urethra with radiation changes present.  No erythema or drainage noted. Foley in place.  Extremities: No bilateral cyanosis, edema, or clubbing.  Foley removed without difficulty.  Clear, yellow urine noted in the leg bag.  Assessment/Plan:  76 year old female with recurrent stage IIIA squamous cell carcinoma of bilateral vulva (right inguinal nodes positive, left not  assessed). S/p adjuvant vulvar and inguinal (bilateral) radiation.  Foley removed without difficulty.  No trial void needed per Dr. Denman George.  She is advised to follow up as scheduled.  Reportable signs and symptoms reviewed.  She is advised to call for any needs, concerns, or new symptoms.  Tricia Pledger DEAL, NP 11/08/2016, 2:48 PM

## 2016-11-22 ENCOUNTER — Telehealth: Payer: Self-pay | Admitting: Nurse Practitioner

## 2016-11-22 DIAGNOSIS — Z13220 Encounter for screening for lipoid disorders: Secondary | ICD-10-CM

## 2016-11-22 DIAGNOSIS — R5383 Other fatigue: Secondary | ICD-10-CM

## 2016-11-22 NOTE — Telephone Encounter (Signed)
Pt is requesting any needed labs to be sent over for an upcoming appt with [name]. Last labs per epic were: cbc,a1c,and glucose on 10/14/16.

## 2016-11-22 NOTE — Telephone Encounter (Signed)
CBC with diff, lipid profile and TSH

## 2016-11-22 NOTE — Telephone Encounter (Signed)
Pt is aware order sent.

## 2016-11-25 DIAGNOSIS — R5383 Other fatigue: Secondary | ICD-10-CM | POA: Diagnosis not present

## 2016-11-25 DIAGNOSIS — Z13220 Encounter for screening for lipoid disorders: Secondary | ICD-10-CM | POA: Diagnosis not present

## 2016-11-26 LAB — CBC WITH DIFFERENTIAL/PLATELET
BASOS ABS: 0 10*3/uL (ref 0.0–0.2)
Basos: 1 %
EOS (ABSOLUTE): 0.2 10*3/uL (ref 0.0–0.4)
Eos: 4 %
Hematocrit: 38.3 % (ref 34.0–46.6)
Hemoglobin: 12.1 g/dL (ref 11.1–15.9)
Immature Grans (Abs): 0 10*3/uL (ref 0.0–0.1)
Immature Granulocytes: 0 %
LYMPHS ABS: 1 10*3/uL (ref 0.7–3.1)
LYMPHS: 20 %
MCH: 27.2 pg (ref 26.6–33.0)
MCHC: 31.6 g/dL (ref 31.5–35.7)
MCV: 86 fL (ref 79–97)
Monocytes Absolute: 0.6 10*3/uL (ref 0.1–0.9)
Monocytes: 12 %
NEUTROS ABS: 3.1 10*3/uL (ref 1.4–7.0)
Neutrophils: 63 %
PLATELETS: 221 10*3/uL (ref 150–379)
RBC: 4.45 x10E6/uL (ref 3.77–5.28)
RDW: 15.7 % — ABNORMAL HIGH (ref 12.3–15.4)
WBC: 4.8 10*3/uL (ref 3.4–10.8)

## 2016-11-26 LAB — LIPID PANEL
CHOLESTEROL TOTAL: 184 mg/dL (ref 100–199)
Chol/HDL Ratio: 3.3 ratio (ref 0.0–4.4)
HDL: 55 mg/dL (ref >39)
LDL Calculated: 96 mg/dL (ref 0–99)
Triglycerides: 164 mg/dL — ABNORMAL HIGH (ref 0–149)
VLDL Cholesterol Cal: 33 mg/dL (ref 5–40)

## 2016-11-26 LAB — TSH: TSH: 4.72 u[IU]/mL — ABNORMAL HIGH (ref 0.450–4.500)

## 2016-11-28 ENCOUNTER — Other Ambulatory Visit: Payer: Self-pay | Admitting: Family Medicine

## 2016-12-11 ENCOUNTER — Encounter: Payer: Self-pay | Admitting: Nurse Practitioner

## 2016-12-11 ENCOUNTER — Ambulatory Visit (INDEPENDENT_AMBULATORY_CARE_PROVIDER_SITE_OTHER): Payer: PPO | Admitting: Nurse Practitioner

## 2016-12-11 VITALS — BP 124/74 | Ht 64.0 in | Wt 194.0 lb

## 2016-12-11 DIAGNOSIS — Z0000 Encounter for general adult medical examination without abnormal findings: Secondary | ICD-10-CM | POA: Diagnosis not present

## 2016-12-11 DIAGNOSIS — I350 Nonrheumatic aortic (valve) stenosis: Secondary | ICD-10-CM

## 2016-12-11 DIAGNOSIS — M159 Polyosteoarthritis, unspecified: Secondary | ICD-10-CM

## 2016-12-11 MED ORDER — HYDROCODONE-ACETAMINOPHEN 5-325 MG PO TABS: 1.0000 | ORAL_TABLET | Freq: Four times a day (QID) | ORAL | 0 refills | Status: DC | PRN

## 2016-12-13 ENCOUNTER — Encounter: Payer: Self-pay | Admitting: Nurse Practitioner

## 2016-12-13 DIAGNOSIS — R918 Other nonspecific abnormal finding of lung field: Secondary | ICD-10-CM | POA: Insufficient documentation

## 2016-12-13 NOTE — Progress Notes (Addendum)
Subjective:    Patient ID: [name], female    DOB: Jan 31, 1940, 76 y.o.   MRN: [id number]  HPI Presents for her wellness exam. Has had a total hysterectomy. Continues follow up with oncologist for vulvar cancer. Regular vision exams. No regular dental exams. Activity has decreased. Gets out some while shopping. Uses rare Hydrocodone only for severe pain; her Rx will last about 2-3 months. She has decreased her use from 60 pills to 36 during this year.     Review of Systems  Constitutional: Positive for fatigue. Negative for activity change and appetite change.  HENT: Negative for dental problem, ear pain, sinus pressure and sore throat.   Respiratory: Negative for cough, chest tightness, shortness of breath and wheezing.   Cardiovascular: Negative for chest pain and leg swelling.  Gastrointestinal: Negative for abdominal distention, abdominal pain, blood in stool, constipation, diarrhea, nausea and vomiting.  Genitourinary: Negative for difficulty urinating, dysuria, enuresis, frequency, genital sores, pelvic pain, urgency and vaginal discharge.       External GU discomfort related to recent vulvar procedure. Having clear drainage which oncologist says is normal.   Musculoskeletal: Positive for arthralgias and joint swelling.       Has osteoarthritis especially in the knees.   Neurological: Negative for facial asymmetry, speech difficulty, weakness and numbness.         Objective:   Physical Exam  Constitutional: She is oriented to person, place, and time. She appears well-developed. No distress.  HENT:  Right Ear: External ear normal.  Left Ear: External ear normal.  Mouth/Throat: Oropharynx is clear and moist.  Neck: Normal range of motion. Neck supple. No tracheal deviation present. No thyromegaly present.  Cardiovascular: Normal rate and regular rhythm. Exam reveals no gallop.  Murmur heard. Grade 3/6 systolic murmur noted loudest at 2nd ICS-/RSB; radiates through the  precordium into the carotids.  Pulmonary/Chest: Effort normal and breath sounds normal. Right breast exhibits no inverted nipple, no nipple discharge, no skin change and no tenderness. Left breast exhibits no inverted nipple, no nipple discharge, no skin change and no tenderness. Breasts are symmetrical.  Axillae no adenopathy.   Abdominal: Soft. She exhibits no distension. There is no tenderness.  Genitourinary: Vagina normal. No vaginal discharge found.  Genitourinary Comments: External GU: part of right labia missing surgically which is healed. Patchy petechial lesions noted both inner labia with clear drainage. Very sensitive/tender to touch. Unable to perform speculum or bimanual exams due to tenderness. Patient states oncologist was unable to do the exam at this point.   Musculoskeletal: She exhibits no edema.  Lymphadenopathy:    She has no cervical adenopathy.  Neurological: She is alert and oriented to person, place, and time.  Skin: Skin is warm and dry. No rash noted.  Psychiatric: She has a normal mood and affect. Her behavior is normal.  Vitals reviewed.  Results for orders placed or performed in visit on 11/22/16  CBC with Differential/Platelet  Result Value Ref Range   WBC 4.8 3.4 - 10.8 x10E3/uL   RBC 4.45 3.77 - 5.28 x10E6/uL   Hemoglobin 12.1 11.1 - 15.9 g/dL   Hematocrit 38.3 34.0 - 46.6 %   MCV 86 79 - 97 fL   MCH 27.2 26.6 - 33.0 pg   MCHC 31.6 31.5 - 35.7 g/dL   RDW 15.7 (H) 12.3 - 15.4 %   Platelets 221 150 - 379 x10E3/uL   Neutrophils 63 Not Estab. %   Lymphs 20 Not Estab. %  Monocytes 12 Not Estab. %   Eos 4 Not Estab. %   Basos 1 Not Estab. %   Neutrophils Absolute 3.1 1.4 - 7.0 x10E3/uL   Lymphocytes Absolute 1.0 0.7 - 3.1 x10E3/uL   Monocytes Absolute 0.6 0.1 - 0.9 x10E3/uL   EOS (ABSOLUTE) 0.2 0.0 - 0.4 x10E3/uL   Basophils Absolute 0.0 0.0 - 0.2 x10E3/uL   Immature Granulocytes 0 Not Estab. %   Immature Grans (Abs) 0.0 0.0 - 0.1 x10E3/uL  Lipid  panel  Result Value Ref Range   Cholesterol, Total 184 100 - 199 mg/dL   Triglycerides 164 (H) 0 - 149 mg/dL   HDL 55 >39 mg/dL   VLDL Cholesterol Cal 33 5 - 40 mg/dL   LDL Calculated 96 0 - 99 mg/dL   Chol/HDL Ratio 3.3 0.0 - 4.4 ratio  TSH  Result Value Ref Range   TSH 4.720 (H) 0.450 - 4.500 uIU/mL   Reviewed recent labs with patient.         Assessment & Plan:   Problem List Items Addressed This Visit      Cardiovascular and Mediastinum   Aortic valve stenosis, mild (Chronic)     Musculoskeletal and Integument   Degenerative arthritis (Chronic)   Relevant Medications   HYDROcodone-acetaminophen (NORCO) 5-325 MG tablet    Other Visit Diagnoses    Physical exam    -  Primary     Meds ordered this encounter  Medications  . HYDROcodone-acetaminophen (NORCO) 5-325 MG tablet    Sig: Take 1 tablet by mouth every 6 (six) hours as needed for moderate pain.    Dispense:  36 tablet    Refill:  0    Order Specific Question:   Supervising Provider    Answer:   Mikey Kirschner [[id number]]   Continue to use pain med sparingly only for severe pain. Recommend getting up every hour while at home and walking around the house. A review of the chart does not indicate when she had her last echo for aortic stenosis. Will have staff check into this and reorder if needed. Patient will ask her specialist to add TSH with her next labs and forward to our office.  PMP reviewed. Recommend dental exam and daily vitamin D/calcium. Return in about 6 months (around 06/10/2017) for recheck.

## 2016-12-20 DIAGNOSIS — L609 Nail disorder, unspecified: Secondary | ICD-10-CM | POA: Diagnosis not present

## 2016-12-20 DIAGNOSIS — E1342 Other specified diabetes mellitus with diabetic polyneuropathy: Secondary | ICD-10-CM | POA: Diagnosis not present

## 2016-12-20 DIAGNOSIS — L851 Acquired keratosis [keratoderma] palmaris et plantaris: Secondary | ICD-10-CM | POA: Diagnosis not present

## 2016-12-23 ENCOUNTER — Other Ambulatory Visit: Payer: Self-pay | Admitting: Family Medicine

## 2016-12-30 ENCOUNTER — Ambulatory Visit: Payer: PPO | Admitting: Radiation Oncology

## 2017-01-02 ENCOUNTER — Other Ambulatory Visit: Payer: Self-pay

## 2017-01-02 ENCOUNTER — Encounter: Payer: Self-pay | Admitting: Radiation Oncology

## 2017-01-02 ENCOUNTER — Ambulatory Visit
Admission: RE | Admit: 2017-01-02 | Discharge: 2017-01-02 | Disposition: A | Discharge status: Home / Self Care | Payer: PPO | Source: Ambulatory Visit | Attending: Radiation Oncology | Admitting: Radiation Oncology

## 2017-01-02 DIAGNOSIS — Z9079 Acquired absence of other genital organ(s): Secondary | ICD-10-CM | POA: Diagnosis not present

## 2017-01-02 DIAGNOSIS — L988 Other specified disorders of the skin and subcutaneous tissue: Secondary | ICD-10-CM | POA: Diagnosis not present

## 2017-01-02 DIAGNOSIS — Z79899 Other long term (current) drug therapy: Secondary | ICD-10-CM | POA: Diagnosis not present

## 2017-01-02 DIAGNOSIS — C519 Malignant neoplasm of vulva, unspecified: Secondary | ICD-10-CM | POA: Insufficient documentation

## 2017-01-02 DIAGNOSIS — Z7982 Long term (current) use of aspirin: Secondary | ICD-10-CM | POA: Diagnosis not present

## 2017-01-02 DIAGNOSIS — Z7984 Long term (current) use of oral hypoglycemic drugs: Secondary | ICD-10-CM | POA: Diagnosis not present

## 2017-01-02 DIAGNOSIS — Z8544 Personal history of malignant neoplasm of other female genital organs: Secondary | ICD-10-CM | POA: Diagnosis not present

## 2017-01-02 DIAGNOSIS — R918 Other nonspecific abnormal finding of lung field: Secondary | ICD-10-CM | POA: Diagnosis not present

## 2017-01-02 NOTE — Progress Notes (Signed)
Radiation Oncology         [phone] ________________________________  Name: [name] MRN: [id number]  Date: 01/02/2017  DOB: [birth year]  Follow-Up Visit Note  CC: Mikey Kirschner, MD  Everitt Amber, MD    ICD-10-CM   1. 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 The patient then proceeded with a boost to the vulvar region the light of the positive surgical margin and continue to a dose of 63 gray.  Narrative:  The patient returns today for routine follow-up. A chest CT on 10/07/16 showed stable bilateral pulmonary nodules that are likely benign, but surveillance in 6-12 months is recommended. On 10/17/16, patient underwent a partial anterior vulvectomy which revealed a moderately differentiated grade G2 non keratinizing squamous cell carcinoma.  She reports seeing some nonpainful, non pruritic lesions of the right groin. She reports intermittent associated drainage from the area she describes as clear/pink. She denies modifying factors of the drainage. She denies any pain. She denies any difficulties with urination or bowel complaints   ALLERGIES:  is allergic to oxycodone; celebrex [celecoxib]; doxycycline; iron; lodine [etodolac]; nabumetone; tetracycline; and xarelto [rivaroxaban].  Meds: Current Outpatient Medications  Medication Sig Dispense Refill  . aspirin 81 MG tablet Take 81 mg by mouth every evening.     . furosemide (LASIX) 40 MG tablet TAKE ONE TABLET BY MOUTH ONCE DAILY. 90 tablet 0  . gabapentin (NEURONTIN) 100 MG capsule Take 100 mg by mouth 2 (two) times daily. Per pt she takes one BID    . HYDROcodone-acetaminophen (NORCO) 5-325 MG tablet Take 1 tablet by mouth every 6 (six) hours as  needed for moderate pain. 36 tablet 0  . losartan (COZAAR) 50 MG tablet TAKE ONE TABLET BY MOUTH ONCE DAILY. 90 tablet 1  . metFORMIN (GLUCOPHAGE) 500 MG tablet TAKE (1) TABLET BY MOUTH TWICE DAILY. 180 tablet 0  . Multiple Vitamins-Minerals (MULTIVITAMIN ADULT PO) Take 1 tablet by mouth daily. Women's 50+    . potassium chloride SA (K-DUR,KLOR-CON) 20 MEQ tablet TAKE 1 TABLET BY MOUTH ONCE A DAY. 90 tablet 0   No current facility-administered medications for this encounter.     REVIEW OF SYSTEMS: A 10+ POINT REVIEW OF SYSTEMS WAS OBTAINED including neurology, dermatology, psychiatry, cardiac, respiratory, lymph, extremities, GI, GU, musculoskeletal, constitutional, reproductive, HEENT. All pertinent positives are noted in the HPI. All others are negative.  Physical Findings: The patient is in no acute distress. Patient is alert and oriented.  height is 5\' 4"  (1.626 m) and weight is 196 lb 9.6 oz (89.2 kg). Her oral temperature is 98.2 F (36.8 C). Her blood pressure is 153/88 (abnormal) and her pulse is 74. Her oxygen saturation is 99%. .   Lungs are clear to auscultation bilaterally. Heart has regular rate and rhythm. No palpable cervical, supraclavicular, or axillary adenopathy. Abdomen soft, non-tender, normal bowel sounds.  Pelvic exam: Surgical changes noted in the perineum. Patient has an approximately 7 mm palpable subcutaneous nodule approximately 2-3 cm above her anterior surgical scar. No palpable or visible recurrence in the inguinal areas bilaterally. Examination of the introitus reveals necrotic tissue, possibly recurrence along the anterior right distal vaginal wall. This area is very tender with palpation.  Lab Findings: Lab Results  Component Value Date   WBC 4.8 11/25/2016   HGB 12.1 11/25/2016   HCT 38.3 11/25/2016   MCV 86 11/25/2016   PLT 221 11/25/2016    Radiographic Findings: No results found.  Impression:  Stage IIIA (pT1b, pN1a) moderately differentiated  squamous cell carcinoma of the right vulva   Patient has had an additional recurrence along the anterior perineum since her radiation therapy with additional surgery (partial radical anterior vulvectomy). This surgery showed clear margins but close along the urethral margin. Exam today is somewhat concerning for necrotic tissue/ possible recurrence.   Plan:  The patient with be scheduled for examination with Dr. Denman George as soon as possible.   ____________________________________ -----------------------------------  Blair Promise, PhD, MD  This document serves as a record of services personally performed by Gery Pray, MD. It was created on his behalf by Marlowe Kays, a trained medical scribe. The creation of this record is based on the scribe's personal observations and the provider's statements to them. This document has been checked and approved by the attending provider.

## 2017-01-02 NOTE — Progress Notes (Signed)
[name] is here for follow up.  She denies having any pain or bladder/bowel issues.  She reports having clear/pink vaginal discharge since her surgery on 10/17/16.  She has noticed two bumps in her vaginal area and is concerned that it is a recurrence.  She reports having some fatigue.  BP (!) 153/88 (BP Location: Left Arm, Patient Position: Sitting)   Pulse 74   Temp 98.2 F (36.8 C) (Oral)   Ht 5\' 4"  (1.626 m)   Wt 196 lb 9.6 oz (89.2 kg)   SpO2 99%   BMI 33.75 kg/m    Wt Readings from Last 3 Encounters:  01/02/17 196 lb 9.6 oz (89.2 kg)  12/11/16 194 lb 0.6 oz (88 kg)  11/06/16 194 lb (88 kg)

## 2017-01-15 ENCOUNTER — Encounter: Payer: Self-pay | Admitting: Gynecologic Oncology

## 2017-01-15 ENCOUNTER — Telehealth: Payer: Self-pay | Admitting: *Deleted

## 2017-01-15 ENCOUNTER — Ambulatory Visit: Payer: PPO | Attending: Gynecologic Oncology | Admitting: Gynecologic Oncology

## 2017-01-15 VITALS — BP 154/67 | HR 90 | Temp 97.5°F | Resp 20 | Wt 196.0 lb

## 2017-01-15 DIAGNOSIS — Z7984 Long term (current) use of oral hypoglycemic drugs: Secondary | ICD-10-CM | POA: Diagnosis not present

## 2017-01-15 DIAGNOSIS — C519 Malignant neoplasm of vulva, unspecified: Secondary | ICD-10-CM | POA: Diagnosis not present

## 2017-01-15 DIAGNOSIS — T8130XA Disruption of wound, unspecified, initial encounter: Secondary | ICD-10-CM | POA: Diagnosis not present

## 2017-01-15 DIAGNOSIS — E119 Type 2 diabetes mellitus without complications: Secondary | ICD-10-CM | POA: Insufficient documentation

## 2017-01-15 DIAGNOSIS — Z881 Allergy status to other antibiotic agents status: Secondary | ICD-10-CM | POA: Insufficient documentation

## 2017-01-15 DIAGNOSIS — Z96642 Presence of left artificial hip joint: Secondary | ICD-10-CM | POA: Insufficient documentation

## 2017-01-15 DIAGNOSIS — Z888 Allergy status to other drugs, medicaments and biological substances status: Secondary | ICD-10-CM | POA: Insufficient documentation

## 2017-01-15 DIAGNOSIS — Z79899 Other long term (current) drug therapy: Secondary | ICD-10-CM | POA: Diagnosis not present

## 2017-01-15 DIAGNOSIS — I10 Essential (primary) hypertension: Secondary | ICD-10-CM | POA: Insufficient documentation

## 2017-01-15 DIAGNOSIS — Z90710 Acquired absence of both cervix and uterus: Secondary | ICD-10-CM | POA: Insufficient documentation

## 2017-01-15 DIAGNOSIS — Z923 Personal history of irradiation: Secondary | ICD-10-CM

## 2017-01-15 DIAGNOSIS — Z9889 Other specified postprocedural states: Secondary | ICD-10-CM | POA: Diagnosis not present

## 2017-01-15 DIAGNOSIS — Z96651 Presence of right artificial knee joint: Secondary | ICD-10-CM | POA: Diagnosis not present

## 2017-01-15 DIAGNOSIS — Z7982 Long term (current) use of aspirin: Secondary | ICD-10-CM | POA: Diagnosis not present

## 2017-01-15 NOTE — Telephone Encounter (Signed)
Open by [employer]

## 2017-01-15 NOTE — Patient Instructions (Signed)
Please return to see Dr Denman George in 1 month.  Notify her office at [phone] if you notice new itching or irritation or a growing mass.

## 2017-01-15 NOTE — Progress Notes (Signed)
Consult Note: Gyn-Onc  Consult was requested by Dr. Elonda Husky for the evaluation of [name] 76 y.o. female   CC:  Chief Complaint  Patient presents with  . Vulvar cancer [facility])    Assessment/Plan:  Ms. [name]  is a 76 y.o.  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 S/p adjuvant vulvar and inguinal (bilateral) radiation. Close urethral margin on recurrent carcinoma specimen.  Recommend close surveillance - will see her again in 1 month to monitor nodule on mons and peri-urethral region.  HPI: [name] is a 76 year old woman who is seen in consultation at the request of Dr Elonda Husky for a right labial lesion.    The patient reports that she began feeling pruritis of the vulva in October, 2017. She was seen by her PCP who saw no lesion and prescribed estradiol.  The pruritis persisted and became worse and she began to feel a nodule in the area and therefore again saw her PCP followed by a referral to gynecologist, Dr Elonda Husky on 02/20/16. The nodular lesion was appreciated on the right vulva. No biopsy was taken at that time. Biopsy was performed on 02/22/16 and pathology from biopsy confirmed invasive SCC.   CT scan abdo/pelvis was negative for evidence of suspicious nodes or distant mets.  On 02/22/16 she underwent right radical vulvectomy and right inguinal lymphadenectomy with Dr Nancy Marus. Final pathology revealed: a 2.1cm moderately differentiated SCC of the right vulva. Margins were negative (closest was 3 o'clock, 59mm). Depth of invasion was 77mm. 1 of 6 inguinofemoral lymph nodes (right) was positive for metastatic disease.   CT chest on 04/01/16 showed: Multiple pulmonary nodules as detailed above, generally in the 91mm and less average diameter range although with 1 right lower lobe subpleural nodule measuring about 8 mm in diameter. These likely warrant surveillance. Hypodense right thyroid  nodule, 1.1 cm in long axis. Consider further evaluation with thyroid ultrasound. If patient is clinically hyperthyroid, consider nuclear medicine thyroid uptake and scan. Stable hypodense lateral splenic lesion, likely a cyst.  Postoperatively after her first vulvectomy she developed irritation on the left vulvar lip. She saw [place] for evaluation in March and complained of this irritation. A nodular lesion was seen in the mid portion of the left labia minora in a "kissing" location to where the primary lesion had been. It was biopsied and confirmed as recurrent SCC of the vulva.  On 04/09/16 she was again taken to the OR, this time for a radical left vulvectomy. A decision was made to not perform left inguinal lymphadenectomy, so as to not delay radiation further (with an additional groin wound to heal), but to instead extend the radiation to both groins.  Pathology from this surgery confirmed a poorly differentiated squamous cell carcinoma of the left vulva with 19mm of invasion. The inferior 6 o'clock margin was "broadly" positive (though macroscopically this margin was grossly negative.  She was recommended to receive adjuvant radiation to the vulva and groins. Radiation treatment dates of 06/05/16-07/25/16 where she received 45Gy in 56fractions directed at the bilateral inguinal and vulvar area. The patient then proceeded with a boost to the vulvar region the light of the positive surgical margin and continue to a dose of 63 gray.  She developed burning and itching on the anterior right vulva in August, 2018 and a 2cm lesion was seen on the anterior vulva approximating the urethral meatus. It was biopsied  in September, 2018 and this confirmed recurrent SCC.  On 10/17/16 she underwent radical anterior vulvectomy. A 3cm raised vulvar lesion was seen at the midline and right vulva extending to within 0.5cm (macroscopically) to the urethral meatus.  Final pathology confirmed SCC, 2.7cm with 43mm invasion,  negative magins though there was a very close (2mm) urethral margin.  Repeat Chest CT in September, 2018 showed stable benign appearing nodules.  Interval Hx:  She saw Dr Sondra Come in December, 2018 and there was concern for possible recurrence at the periurethral incision area due to necrosis.  Current Meds:  Outpatient Encounter Medications as of 01/15/2017  Medication Sig  . aspirin 81 MG tablet Take 81 mg by mouth every evening.   . furosemide (LASIX) 40 MG tablet TAKE ONE TABLET BY MOUTH ONCE DAILY.  Marland Kitchen gabapentin (NEURONTIN) 100 MG capsule Take 100 mg by mouth 2 (two) times daily. Per pt she takes one BID  . HYDROcodone-acetaminophen (NORCO) 5-325 MG tablet Take 1 tablet by mouth every 6 (six) hours as needed for moderate pain.  Marland Kitchen losartan (COZAAR) 50 MG tablet TAKE ONE TABLET BY MOUTH ONCE DAILY.  . metFORMIN (GLUCOPHAGE) 500 MG tablet TAKE (1) TABLET BY MOUTH TWICE DAILY.  . Multiple Vitamins-Minerals (MULTIVITAMIN ADULT PO) Take 1 tablet by mouth daily. Women's 50+  . potassium chloride SA (K-DUR,KLOR-CON) 20 MEQ tablet TAKE 1 TABLET BY MOUTH ONCE A DAY.   No facility-administered encounter medications on file as of 01/15/2017.     Allergy:  Allergies  Allergen Reactions  . Oxycodone Other (See Comments)    Intense H/A  . Celebrex [Celecoxib] Other (See Comments)    GI upset   . Doxycycline Nausea Only    Gi upset  . Iron Other (See Comments)    Pt cannot tolerate some iron preparations. The upset her stomach.  . Lodine [Etodolac] Nausea Only    Gi upset  . Nabumetone Other (See Comments)    (relafen) unknown  . Tetracycline Nausea And Vomiting  . Xarelto [Rivaroxaban] Hives    Social Hx:   Social History   Socioeconomic History  . Marital status: Married    Spouse name: Not on file  . Number of children: 2  . Years of education: Not on file  . Highest education level: Not on file  Social Needs  . Financial resource strain: Not on file  . Food insecurity -  worry: Not on file  . Food insecurity - inability: Not on file  . Transportation needs - medical: Not on file  . Transportation needs - non-medical: Not on file  Occupational History  . Not on file  Tobacco Use  . Smoking status: Never Smoker  . Smokeless tobacco: Never Used  Substance and Sexual Activity  . Alcohol use: No    Alcohol/week: 0.0 oz  . Drug use: No  . Sexual activity: Not on file  Other Topics Concern  . Not on file  Social History Narrative  . Not on file    Past Surgical Hx:  Past Surgical History:  Procedure Laterality Date  . ABDOMINAL HYSTERECTOMY     complete  . BACK SURGERY     upper  with plate frontal fusion and lower x 2  . COLONOSCOPY    . COLONOSCOPY N/A 05/05/2016   Procedure: COLONOSCOPY;  Surgeon: Daneil Dolin, MD;  Location: [facility];  Service: Endoscopy;  Laterality: N/A;  . ESOPHAGOGASTRODUODENOSCOPY N/A 05/04/2016   Procedure: ESOPHAGOGASTRODUODENOSCOPY (EGD);  Surgeon: Daneil Dolin,  MD;  Location: [facility];  Service: Endoscopy;  Laterality: N/A;  . GIVENS CAPSULE STUDY N/A 05/06/2016   Procedure: GIVENS CAPSULE STUDY;  Surgeon: Daneil Dolin, MD;  Location: [facility];  Service: Endoscopy;  Laterality: N/A;  . KNEE SURGERY Left    arthroscopy  . RADICAL VULVECTOMY Left 04/09/2016   Procedure: RADICAL LEFT VULVECTOMY;  Surgeon: Everitt Amber, MD;  Location: [facility];  Service: Gynecology;  Laterality: Left;  . RADICAL VULVECTOMY  10/17/2016   Procedure: RADICAL ANTERIOR VULVECTOMY;  Surgeon: Everitt Amber, MD;  Location: [facility];  Service: Gynecology;;  . TOE SURGERY Left   . TONSILLECTOMY    . TOTAL HIP ARTHROPLASTY Left 10/27/2013   Procedure: LEFT TOTAL HIP ARTHROPLASTY;  Surgeon: Tobi Bastos, MD;  Location: [facility];  Service: Orthopedics;  Laterality: Left;  . TOTAL KNEE ARTHROPLASTY Right   . VULVA /PERINEUM BIOPSY  02/22/2016  . VULVECTOMY Right 02/27/2016   Procedure: RADICAL RIGHT VULVECTOMY WITH RIGHT INGUINAL LYMPH NODE  DISSECTION;  Surgeon: Nancy Marus, MD;  Location: [facility];  Service: Gynecology;  Laterality: Right;    Past Medical Hx:  Past Medical History:  Diagnosis Date  . Aortic stenosis, mild   . Aortic valve stenosis, mild   . Arthritis   . Cancer ([place])    vulvar   . Complication of anesthesia   . Diabetes mellitus without complication ([place])    type 2  . Heart murmur   . History of blood transfusion age 39 or 27 months old  . History of radiation therapy 06/05/16 - 07/25/16   Pelvis treated to 45 Gy in 25 fractions, then Boosted an additional 18 Gy in 9 fractions.  . Hypertension   . IFG (impaired fasting glucose)   . PONV (postoperative nausea and vomiting)    years ago with back surgery-nausea  . Vitamin D deficiency     Past Gynecological History:  Remote hx of hysterectomy for fibroids. No hx of abnromal paps (last pap remote) No LMP recorded. Patient has had a hysterectomy.  Family Hx:  Family History  Problem Relation Age of Onset  . Hypertension Mother   . Hypertension Father   . Throat cancer Father   . Cancer Sister        Breast  . Hypertension Brother     Review of Systems:  Constitutional  Feels well,    ENT Normal appearing ears and nares bilaterally Skin/Breast  + vulvar pruritis. Cardiovascular  No chest pain, shortness of breath, or edema  Pulmonary  No cough or wheeze.  Gastro Intestinal  No nausea, vomitting, or diarrhoea. No bright red blood per rectum, no abdominal pain, change in bowel movement, or constipation.  Genito Urinary  No frequency, urgency, dysuria, no bleeding Musculo Skeletal  No myalgia, arthralgia, joint swelling or pain  Neurologic  No weakness, numbness, change in gait,  Psychology  No depression, anxiety, insomnia.   Vitals:  Blood pressure (!) 154/67, pulse 90, temperature (!) 97.5 F (36.4 C), temperature source Oral, resp. rate 20, weight 196 lb (88.9 kg), SpO2 98 %.  Physical Exam: WD in NAD Neck  Supple NROM, without  any enlargements.  Lymph Node Survey No cervical supraclavicular or inguinal adenopathy. Right groin incision well healed.  Cardiovascular  Pulse normal rate, regularity and rhythm. S1 and S2 normal.  Lungs  Clear to auscultation bilateraly, without wheezes/crackles/rhonchi. Good air movement.  Skin  No rash/lesions/breakdown  Psychiatry  Alert and oriented to person, place, and  time  Abdomen  Normoactive bowel sounds, abdomen soft, non-tender and obese without evidence of hernia.  Back No CVA tenderness Genito Urinary  Vulva/vagina: Radiation changes present. Anterior mons incision in tact, persistent wound separation of peri-urethral incision but clean granulating base, no evidence of cellulitis, no evidence of recurrent tumor. There is a 1cm nodule deep to anterior mons incision, mobile, firm, most consistent with stitch granuloma.  Bladder/urethra:  No lesions or masses, well supported bladder  Vagina: atrophic, no lesions  Cervix: surgically absent  Uterus: surgically absen  Adnexa: no palpable masses. Rectal  deferred Extremities  No bilateral cyanosis, clubbing or edema.   Donaciano Eva, MD  01/15/2017, 4:24 PM

## 2017-01-15 NOTE — Telephone Encounter (Signed)
Patient called to schedule appt. The patient report to have drainage and Dr.Kinard doesn't think she is healing properly. A[ppt made for today

## 2017-01-22 ENCOUNTER — Telehealth: Payer: Self-pay | Admitting: Nurse Practitioner

## 2017-01-22 ENCOUNTER — Other Ambulatory Visit: Payer: Self-pay | Admitting: Nurse Practitioner

## 2017-01-22 MED ORDER — HYDROCODONE-ACETAMINOPHEN 5-325 MG PO TABS: 1.0000 | ORAL_TABLET | Freq: Four times a day (QID) | ORAL | 0 refills | Status: DC | PRN

## 2017-01-22 NOTE — Telephone Encounter (Signed)
Done

## 2017-01-22 NOTE — Progress Notes (Signed)
PMP reviewed

## 2017-01-22 NOTE — Telephone Encounter (Signed)
Requesting refill on hydrocodone.   °

## 2017-01-24 ENCOUNTER — Other Ambulatory Visit: Payer: Self-pay | Admitting: Family Medicine

## 2017-02-07 ENCOUNTER — Other Ambulatory Visit: Payer: Self-pay | Admitting: Family Medicine

## 2017-02-17 ENCOUNTER — Inpatient Hospital Stay: Payer: PPO | Attending: Gynecologic Oncology | Admitting: Gynecologic Oncology

## 2017-02-17 ENCOUNTER — Encounter: Payer: Self-pay | Admitting: Gynecologic Oncology

## 2017-02-17 VITALS — BP 113/66 | HR 93 | Temp 98.0°F | Resp 18 | Ht 63.0 in | Wt 199.1 lb

## 2017-02-17 DIAGNOSIS — Z90710 Acquired absence of both cervix and uterus: Secondary | ICD-10-CM | POA: Diagnosis not present

## 2017-02-17 DIAGNOSIS — I350 Nonrheumatic aortic (valve) stenosis: Secondary | ICD-10-CM | POA: Diagnosis not present

## 2017-02-17 DIAGNOSIS — C775 Secondary and unspecified malignant neoplasm of intrapelvic lymph nodes: Secondary | ICD-10-CM | POA: Diagnosis not present

## 2017-02-17 DIAGNOSIS — Z7984 Long term (current) use of oral hypoglycemic drugs: Secondary | ICD-10-CM

## 2017-02-17 DIAGNOSIS — Z923 Personal history of irradiation: Secondary | ICD-10-CM | POA: Insufficient documentation

## 2017-02-17 DIAGNOSIS — E119 Type 2 diabetes mellitus without complications: Secondary | ICD-10-CM | POA: Insufficient documentation

## 2017-02-17 DIAGNOSIS — C519 Malignant neoplasm of vulva, unspecified: Secondary | ICD-10-CM

## 2017-02-17 DIAGNOSIS — Z7982 Long term (current) use of aspirin: Secondary | ICD-10-CM

## 2017-02-17 DIAGNOSIS — E559 Vitamin D deficiency, unspecified: Secondary | ICD-10-CM | POA: Insufficient documentation

## 2017-02-17 DIAGNOSIS — I10 Essential (primary) hypertension: Secondary | ICD-10-CM | POA: Diagnosis not present

## 2017-02-17 DIAGNOSIS — N9089 Other specified noninflammatory disorders of vulva and perineum: Secondary | ICD-10-CM

## 2017-02-17 DIAGNOSIS — Z79899 Other long term (current) drug therapy: Secondary | ICD-10-CM | POA: Insufficient documentation

## 2017-02-17 DIAGNOSIS — N762 Acute vulvitis: Secondary | ICD-10-CM | POA: Diagnosis not present

## 2017-02-17 MED ORDER — CEPHALEXIN 500 MG PO CAPS: 500.0000 mg | ORAL_CAPSULE | Freq: Three times a day (TID) | ORAL | 0 refills | Status: DC

## 2017-02-17 NOTE — Patient Instructions (Signed)
You can remove the packing after 24 hours (tomorrow afternoon).  Plan on following up in one month or sooner if needed.  Please call for any questions or concerns.

## 2017-02-17 NOTE — Progress Notes (Signed)
Consult Note: Gyn-Onc  Consult was requested by Dr. Elonda Husky for the evaluation of [name] 77 y.o. female   CC:  Chief Complaint  Patient presents with  . Vulvar cancer [facility])    Assessment/Plan:  [name]  is a 77 y.o.  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 S/p adjuvant vulvar and inguinal (bilateral) radiation. Close urethral margin on recurrent carcinoma specimen.  Recommend close surveillance - will see her again in 1 month to monitor nodule on mons and peri-urethral region. Follow-up biopsy result. If benign, continue observation. If malignant, would need resection of the anterior vulva.   HPI: [name] is a 77 year old woman who is seen in consultation at the request of Dr Elonda Husky for a right labial lesion.    The patient reports that she began feeling pruritis of the vulva in October, 2017. She was seen by her PCP who saw no lesion and prescribed estradiol.  The pruritis persisted and became worse and she began to feel a nodule in the area and therefore again saw her PCP followed by a referral to gynecologist, Dr Elonda Husky on 02/20/16. The nodular lesion was appreciated on the right vulva. No biopsy was taken at that time. Biopsy was performed on 02/22/16 and pathology from biopsy confirmed invasive SCC.   CT scan abdo/pelvis was negative for evidence of suspicious nodes or distant mets.  On 02/22/16 she underwent right radical vulvectomy and right inguinal lymphadenectomy with Dr Nancy Marus. Final pathology revealed: a 2.1cm moderately differentiated SCC of the right vulva. Margins were negative (closest was 3 o'clock, 20mm). Depth of invasion was 18mm. 1 of 6 inguinofemoral lymph nodes (right) was positive for metastatic disease.   CT chest on 04/01/16 showed: Multiple pulmonary nodules as detailed above, generally in the 62mm and less average diameter range although with 1 right  lower lobe subpleural nodule measuring about 8 mm in diameter. These likely warrant surveillance. Hypodense right thyroid nodule, 1.1 cm in long axis. Consider further evaluation with thyroid ultrasound. If patient is clinically hyperthyroid, consider nuclear medicine thyroid uptake and scan. Stable hypodense lateral splenic lesion, likely a cyst.  Postoperatively after her first vulvectomy she developed irritation on the left vulvar lip. She saw [place] for evaluation in March and complained of this irritation. A nodular lesion was seen in the mid portion of the left labia minora in a "kissing" location to where the primary lesion had been. It was biopsied and confirmed as recurrent SCC of the vulva.  On 04/09/16 she was again taken to the OR, this time for a radical left vulvectomy. A decision was made to not perform left inguinal lymphadenectomy, so as to not delay radiation further (with an additional groin wound to heal), but to instead extend the radiation to both groins.  Pathology from this surgery confirmed a poorly differentiated squamous cell carcinoma of the left vulva with 86mm of invasion. The inferior 6 o'clock margin was "broadly" positive (though macroscopically this margin was grossly negative.  She was recommended to receive adjuvant radiation to the vulva and groins. Radiation treatment dates of 06/05/16-07/25/16 where she received 45Gy in 53fractions directed at the bilateral inguinal and vulvar area. The patient then proceeded with a boost to the vulvar region the light of the positive surgical margin and continue to a dose of 63 gray.  She developed burning and itching on the anterior right vulva in August, 2018  and a 2cm lesion was seen on the anterior vulva approximating the urethral meatus. It was biopsied in September, 2018 and this confirmed recurrent SCC.  On 10/17/16 she underwent radical anterior vulvectomy. A 3cm raised vulvar lesion was seen at the midline and right vulva  extending to within 0.5cm (macroscopically) to the urethral meatus.  Final pathology confirmed SCC, 2.7cm with 68mm invasion, negative magins though there was a very close (54mm) urethral margin.  Repeat Chest CT in September, 2018 showed stable benign appearing nodules.  Interval Hx:  She saw Dr Sondra Come in December, 2018 and there was concern for possible recurrence at the periurethral incision area due to necrosis.  She was being followed for a 1cm mass at the mons pubis incision.  She has noted this area getting larger and with burning sensation when it touches the fabric of her clothes.   Current Meds:  Outpatient Encounter Medications as of 02/17/2017  Medication Sig  . aspirin 81 MG tablet Take 81 mg by mouth every evening.   . furosemide (LASIX) 40 MG tablet TAKE (1) TABLET BY MOUTH ONCE DAILY.  Marland Kitchen gabapentin (NEURONTIN) 100 MG capsule Take 100 mg by mouth 2 (two) times daily. Per pt she takes one BID  . HYDROcodone-acetaminophen (NORCO) 5-325 MG tablet Take 1 tablet by mouth every 6 (six) hours as needed for moderate pain.  Marland Kitchen losartan (COZAAR) 50 MG tablet TAKE ONE TABLET BY MOUTH ONCE DAILY.  . metFORMIN (GLUCOPHAGE) 500 MG tablet TAKE (1) TABLET BY MOUTH TWICE DAILY.  . Multiple Vitamins-Minerals (MULTIVITAMIN ADULT PO) Take 1 tablet by mouth daily. Women's 50+  . potassium chloride SA (K-DUR,KLOR-CON) 20 MEQ tablet TAKE 1 TABLET BY MOUTH ONCE A DAY.  . cephALEXin (KEFLEX) 500 MG capsule Take 1 capsule (500 mg total) by mouth 3 (three) times daily.   No facility-administered encounter medications on file as of 02/17/2017.     Allergy:  Allergies  Allergen Reactions  . Oxycodone Other (See Comments)    Intense H/A  . Celebrex [Celecoxib] Other (See Comments)    GI upset   . Doxycycline Nausea Only    Gi upset  . Iron Other (See Comments)    Pt cannot tolerate some iron preparations. The upset her stomach.  . Lodine [Etodolac] Nausea Only    Gi upset  . Nabumetone Other  (See Comments)    (relafen) unknown  . Tetracycline Nausea And Vomiting  . Xarelto [Rivaroxaban] Hives    Social Hx:   Social History   Socioeconomic History  . Marital status: Married    Spouse name: Not on file  . Number of children: 2  . Years of education: Not on file  . Highest education level: Not on file  Social Needs  . Financial resource strain: Not on file  . Food insecurity - worry: Not on file  . Food insecurity - inability: Not on file  . Transportation needs - medical: Not on file  . Transportation needs - non-medical: Not on file  Occupational History  . Not on file  Tobacco Use  . Smoking status: Never Smoker  . Smokeless tobacco: Never Used  Substance and Sexual Activity  . Alcohol use: No    Alcohol/week: 0.0 oz  . Drug use: No  . Sexual activity: Not on file  Other Topics Concern  . Not on file  Social History Narrative  . Not on file    Past Surgical Hx:  Past Surgical History:  Procedure Laterality Date  .  ABDOMINAL HYSTERECTOMY     complete  . BACK SURGERY     upper  with plate frontal fusion and lower x 2  . COLONOSCOPY    . COLONOSCOPY N/A 05/05/2016   Procedure: COLONOSCOPY;  Surgeon: Daneil Dolin, MD;  Location: [facility];  Service: Endoscopy;  Laterality: N/A;  . ESOPHAGOGASTRODUODENOSCOPY N/A 05/04/2016   Procedure: ESOPHAGOGASTRODUODENOSCOPY (EGD);  Surgeon: Daneil Dolin, MD;  Location: [facility];  Service: Endoscopy;  Laterality: N/A;  . GIVENS CAPSULE STUDY N/A 05/06/2016   Procedure: GIVENS CAPSULE STUDY;  Surgeon: Daneil Dolin, MD;  Location: [facility];  Service: Endoscopy;  Laterality: N/A;  . KNEE SURGERY Left    arthroscopy  . RADICAL VULVECTOMY Left 04/09/2016   Procedure: RADICAL LEFT VULVECTOMY;  Surgeon: Everitt Amber, MD;  Location: [facility];  Service: Gynecology;  Laterality: Left;  . RADICAL VULVECTOMY  10/17/2016   Procedure: RADICAL ANTERIOR VULVECTOMY;  Surgeon: Everitt Amber, MD;  Location: [facility];  Service:  Gynecology;;  . TOE SURGERY Left   . TONSILLECTOMY    . TOTAL HIP ARTHROPLASTY Left 10/27/2013   Procedure: LEFT TOTAL HIP ARTHROPLASTY;  Surgeon: Tobi Bastos, MD;  Location: [facility];  Service: Orthopedics;  Laterality: Left;  . TOTAL KNEE ARTHROPLASTY Right   . VULVA /PERINEUM BIOPSY  02/22/2016  . VULVECTOMY Right 02/27/2016   Procedure: RADICAL RIGHT VULVECTOMY WITH RIGHT INGUINAL LYMPH NODE DISSECTION;  Surgeon: Nancy Marus, MD;  Location: [facility];  Service: Gynecology;  Laterality: Right;    Past Medical Hx:  Past Medical History:  Diagnosis Date  . Aortic stenosis, mild   . Aortic valve stenosis, mild   . Arthritis   . Cancer ([place])    vulvar   . Complication of anesthesia   . Diabetes mellitus without complication ([place])    type 2  . Heart murmur   . History of blood transfusion age 26 or 17 months old  . History of radiation therapy 06/05/16 - 07/25/16   Pelvis treated to 45 Gy in 25 fractions, then Boosted an additional 18 Gy in 9 fractions.  . Hypertension   . IFG (impaired fasting glucose)   . PONV (postoperative nausea and vomiting)    years ago with back surgery-nausea  . Vitamin D deficiency     Past Gynecological History:  Remote hx of hysterectomy for fibroids. No hx of abnromal paps (last pap remote) No LMP recorded. Patient has had a hysterectomy.  Family Hx:  Family History  Problem Relation Age of Onset  . Hypertension Mother   . Hypertension Father   . Throat cancer Father   . Cancer Sister        Breast  . Hypertension Brother     Review of Systems:  Constitutional  Feels well,    ENT Normal appearing ears and nares bilaterally Skin/Breast  + vulvar pruritis. Cardiovascular  No chest pain, shortness of breath, or edema  Pulmonary  No cough or wheeze.  Gastro Intestinal  No nausea, vomitting, or diarrhoea. No bright red blood per rectum, no abdominal pain, change in bowel movement, or constipation.  Genito Urinary  No frequency, urgency,  dysuria, no bleeding Musculo Skeletal  No myalgia, arthralgia, joint swelling or pain  Neurologic  No weakness, numbness, change in gait,  Psychology  No depression, anxiety, insomnia.   Vitals:  Blood pressure 113/66, pulse 93, temperature 98 F (36.7 C), temperature source Oral, resp. rate 18, height 5\' 3"  (1.6 m), weight 199  lb 1.6 oz (90.3 kg), SpO2 98 %.  Physical Exam: WD in NAD Neck  Supple NROM, without any enlargements.  Lymph Node Survey No cervical supraclavicular or inguinal adenopathy. Right groin incision well healed.  Cardiovascular  Pulse normal rate, regularity and rhythm. S1 and S2 normal.  Lungs  Clear to auscultation bilateraly, without wheezes/crackles/rhonchi. Good air movement.  Skin  No rash/lesions/breakdown  Psychiatry  Alert and oriented to person, place, and time  Abdomen  Normoactive bowel sounds, abdomen soft, non-tender and obese without evidence of hernia.  Back No CVA tenderness Genito Urinary  Vulva/vagina: Radiation changes present. Anterior mons incision in tact, persistent wound separation of peri-urethral incision but clean granulating base, no evidence of cellulitis, no evidence of recurrent tumor. There is a 2.5cm fluctuant mass to the right of the anterior mons incision, with indurated base.  Bladder/urethra:  No lesions or masses, well supported bladder  Vagina: atrophic, no lesions  Cervix: surgically absent  Uterus: surgically absen  Adnexa: no palpable masses. Rectal  deferred Extremities  No bilateral cyanosis, clubbing or edema.  PROCEDURE NOTE: VULVA BIOPSY Preop Dx: vulvar mass Postop dx: same Procedure: biopsy of anterior vulva Surgeon: Everitt Amber, MD Complications: none Specimen: anterior mons pubis to pathology EBL: minimal. Findings: 2.5cm fluctuant mass on anterior mons pubis.  Procedure: patient was verbally consented and time out performed. Area prepped with betadine and infiltrated with 2cc of 1%  lidocaine. 18mm punch used to take roof off of abscess. Sent for pathology. Fluid evacuated from cavity and cavity probed with queue tip.  Iodoform guaze placed within cavity.     Donaciano Eva, MD  02/17/2017, 6:19 PM

## 2017-02-21 ENCOUNTER — Telehealth: Payer: Self-pay

## 2017-02-21 NOTE — Telephone Encounter (Signed)
Outgoing call to patient regarding her surgical path results of vulva biopsy, per Melissa John NP as "no pre-cancer or cancer" noted. Notified patient of results. No other needs per patient at this time.

## 2017-02-28 DIAGNOSIS — L609 Nail disorder, unspecified: Secondary | ICD-10-CM | POA: Diagnosis not present

## 2017-02-28 DIAGNOSIS — E1342 Other specified diabetes mellitus with diabetic polyneuropathy: Secondary | ICD-10-CM | POA: Diagnosis not present

## 2017-02-28 DIAGNOSIS — L851 Acquired keratosis [keratoderma] palmaris et plantaris: Secondary | ICD-10-CM | POA: Diagnosis not present

## 2017-03-06 ENCOUNTER — Other Ambulatory Visit: Payer: Self-pay | Admitting: Family Medicine

## 2017-03-06 ENCOUNTER — Other Ambulatory Visit: Payer: Self-pay | Admitting: Nurse Practitioner

## 2017-03-10 ENCOUNTER — Telehealth: Payer: Self-pay | Admitting: Nurse Practitioner

## 2017-03-10 ENCOUNTER — Other Ambulatory Visit: Payer: Self-pay | Admitting: Nurse Practitioner

## 2017-03-10 MED ORDER — HYDROCODONE-ACETAMINOPHEN 5-325 MG PO TABS: 1.0000 | ORAL_TABLET | Freq: Four times a day (QID) | ORAL | 0 refills | Status: DC | PRN

## 2017-03-10 NOTE — Telephone Encounter (Signed)
Requesting refill on hydrocodone 5-325 mg.  She has about 5-6 pills left.  Larene Pickett

## 2017-03-10 NOTE — Telephone Encounter (Signed)
Refill sent in

## 2017-03-10 NOTE — Telephone Encounter (Signed)
Pt.notified

## 2017-03-10 NOTE — Telephone Encounter (Signed)
Last seen 12/11/16 for wellness

## 2017-03-10 NOTE — Progress Notes (Signed)
PMP reviewed

## 2017-03-17 ENCOUNTER — Inpatient Hospital Stay: Payer: PPO | Attending: Gynecologic Oncology | Admitting: Gynecologic Oncology

## 2017-03-17 ENCOUNTER — Encounter: Payer: Self-pay | Admitting: Gynecologic Oncology

## 2017-03-17 VITALS — BP 120/72 | HR 100 | Temp 98.1°F | Resp 20 | Wt 197.0 lb

## 2017-03-17 DIAGNOSIS — N9089 Other specified noninflammatory disorders of vulva and perineum: Secondary | ICD-10-CM | POA: Insufficient documentation

## 2017-03-17 DIAGNOSIS — M792 Neuralgia and neuritis, unspecified: Secondary | ICD-10-CM | POA: Insufficient documentation

## 2017-03-17 DIAGNOSIS — G629 Polyneuropathy, unspecified: Secondary | ICD-10-CM | POA: Insufficient documentation

## 2017-03-17 DIAGNOSIS — C519 Malignant neoplasm of vulva, unspecified: Secondary | ICD-10-CM

## 2017-03-17 DIAGNOSIS — Z923 Personal history of irradiation: Secondary | ICD-10-CM | POA: Diagnosis not present

## 2017-03-17 MED ORDER — LIDOCAINE HCL 4 % EX SOLN: CUTANEOUS | 0 refills | Status: DC | PRN

## 2017-03-17 MED ORDER — CLINDAMYCIN HCL 150 MG PO CAPS: 150.0000 mg | ORAL_CAPSULE | Freq: Three times a day (TID) | ORAL | 1 refills | Status: DC

## 2017-03-17 NOTE — Progress Notes (Signed)
Consult Note: Gyn-Onc  Consult was requested by Dr. Elonda Husky for the evaluation of [name] 77 y.o. female   CC:  Chief Complaint  Patient presents with  . Vulva cancer [facility])    Assessment/Plan:  [name]  is a 77 y.o.  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 S/p adjuvant vulvar and inguinal (bilateral) radiation. Close urethral margin on recurrent carcinoma specimen.  Anterior mons caruncle - patient declined repeat I&D. No spreading cellulitis. Will try empiric cleocin for 2 weeks. I will see her back in 1 month. If not resolved, will need to go to OR for incision and drainage under anesthesia.  Neuropathic pain in right groin - already on Neurontin -recommended to increase to 3 times per day. Try topical xylocaine.   Recommend close surveillance - will see her again in 1 months to monitor nodule on mons and peri-urethral region.  HPI: [name] is a 77 year old woman who is seen in consultation at the request of Dr Elonda Husky for a right labial lesion.    The patient reports that she began feeling pruritis of the vulva in October, 2017. She was seen by her PCP who saw no lesion and prescribed estradiol.  The pruritis persisted and became worse and she began to feel a nodule in the area and therefore again saw her PCP followed by a referral to gynecologist, Dr Elonda Husky on 02/20/16. The nodular lesion was appreciated on the right vulva. No biopsy was taken at that time. Biopsy was performed on 02/22/16 and pathology from biopsy confirmed invasive SCC.   CT scan abdo/pelvis was negative for evidence of suspicious nodes or distant mets.  On 02/22/16 she underwent right radical vulvectomy and right inguinal lymphadenectomy with Dr Nancy Marus. Final pathology revealed: a 2.1cm moderately differentiated SCC of the right vulva. Margins were negative (closest was 3 o'clock, 75mm). Depth of invasion  was 42mm. 1 of 6 inguinofemoral lymph nodes (right) was positive for metastatic disease.   CT chest on 04/01/16 showed: Multiple pulmonary nodules as detailed above, generally in the 10mm and less average diameter range although with 1 right lower lobe subpleural nodule measuring about 8 mm in diameter. These likely warrant surveillance. Hypodense right thyroid nodule, 1.1 cm in long axis. Consider further evaluation with thyroid ultrasound. If patient is clinically hyperthyroid, consider nuclear medicine thyroid uptake and scan. Stable hypodense lateral splenic lesion, likely a cyst.  Postoperatively after her first vulvectomy she developed irritation on the left vulvar lip. She saw [place] for evaluation in March and complained of this irritation. A nodular lesion was seen in the mid portion of the left labia minora in a "kissing" location to where the primary lesion had been. It was biopsied and confirmed as recurrent SCC of the vulva.  On 04/09/16 she was again taken to the OR, this time for a radical left vulvectomy. A decision was made to not perform left inguinal lymphadenectomy, so as to not delay radiation further (with an additional groin wound to heal), but to instead extend the radiation to both groins.  Pathology from this surgery confirmed a poorly differentiated squamous cell carcinoma of the left vulva with 47mm of invasion. The inferior 6 o'clock margin was "broadly" positive (though macroscopically this margin was grossly negative.  She was recommended to receive adjuvant radiation to the vulva and groins. Radiation treatment dates of 06/05/16-07/25/16 where she received 45Gy in 14fractions directed  at the bilateral inguinal and vulvar area. The patient then proceeded with a boost to the vulvar region the light of the positive surgical margin and continue to a dose of 63 gray.  She developed burning and itching on the anterior right vulva in August, 2018 and a 2cm lesion was seen on the anterior  vulva approximating the urethral meatus. It was biopsied in September, 2018 and this confirmed recurrent SCC.  On 10/17/16 she underwent radical anterior vulvectomy. A 3cm raised vulvar lesion was seen at the midline and right vulva extending to within 0.5cm (macroscopically) to the urethral meatus.  Final pathology confirmed SCC, 2.7cm with 21mm invasion, negative magins though there was a very close (16mm) urethral margin.  Repeat Chest CT in September, 2018 showed stable benign appearing nodules.  Interval Hx:  She saw Dr Sondra Come in December, 2018 and there was concern for possible recurrence at the periurethral incision area due to necrosis.  She was being followed for a 1cm mass at the mons pubis incision.  She has noted this area getting larger and with burning sensation when it touches the fabric of her clothes.   A 2.5cm fluctuant area was noted to the right of the midline mons incision. It was biopsied on 02/17/17 with pathology showing no recurrence only inflammatory changes consistent with an abscess.  She had some initial mild relief to the office I&D, but subseqently developed increasing pain again. She denies fevers. She has separate right inguinal crease neuralgia (burning pain). Minimal relief from gabapentin BID.   Current Meds:  Outpatient Encounter Medications as of 03/17/2017  Medication Sig  . aspirin 81 MG tablet Take 81 mg by mouth every evening.   . cephALEXin (KEFLEX) 500 MG capsule Take 1 capsule (500 mg total) by mouth 3 (three) times daily.  . furosemide (LASIX) 40 MG tablet TAKE (1) TABLET BY MOUTH ONCE DAILY.  Marland Kitchen gabapentin (NEURONTIN) 100 MG capsule TAKE 1 CAPSULE BY MOUTH THREE TIMES A DAY.  Marland Kitchen HYDROcodone-acetaminophen (NORCO) 5-325 MG tablet Take 1 tablet by mouth every 6 (six) hours as needed for moderate pain.  Marland Kitchen losartan (COZAAR) 50 MG tablet TAKE ONE TABLET BY MOUTH ONCE DAILY.  . metFORMIN (GLUCOPHAGE) 500 MG tablet TAKE (1) TABLET BY MOUTH TWICE DAILY.  .  Multiple Vitamins-Minerals (MULTIVITAMIN ADULT PO) Take 1 tablet by mouth daily. Women's 50+  . potassium chloride SA (K-DUR,KLOR-CON) 20 MEQ tablet TAKE 1 TABLET BY MOUTH ONCE A DAY.  Marland Kitchen potassium chloride SA (K-DUR,KLOR-CON) 20 MEQ tablet TAKE 1 TABLET BY MOUTH ONCE A DAY.   No facility-administered encounter medications on file as of 03/17/2017.     Allergy:  Allergies  Allergen Reactions  . Oxycodone Other (See Comments)    Intense H/A  . Celebrex [Celecoxib] Other (See Comments)    GI upset   . Doxycycline Nausea Only    Gi upset  . Iron Other (See Comments)    Pt cannot tolerate some iron preparations. The upset her stomach.  . Lodine [Etodolac] Nausea Only    Gi upset  . Nabumetone Other (See Comments)    (relafen) unknown  . Tetracycline Nausea And Vomiting  . Xarelto [Rivaroxaban] Hives    Social Hx:   Social History   Socioeconomic History  . Marital status: Married    Spouse name: Not on file  . Number of children: 2  . Years of education: Not on file  . Highest education level: Not on file  Social Needs  . Emergency planning/management officer  strain: Not on file  . Food insecurity - worry: Not on file  . Food insecurity - inability: Not on file  . Transportation needs - medical: Not on file  . Transportation needs - non-medical: Not on file  Occupational History  . Not on file  Tobacco Use  . Smoking status: Never Smoker  . Smokeless tobacco: Never Used  Substance and Sexual Activity  . Alcohol use: No    Alcohol/week: 0.0 oz  . Drug use: No  . Sexual activity: Not on file  Other Topics Concern  . Not on file  Social History Narrative  . Not on file    Past Surgical Hx:  Past Surgical History:  Procedure Laterality Date  . ABDOMINAL HYSTERECTOMY     complete  . BACK SURGERY     upper  with plate frontal fusion and lower x 2  . COLONOSCOPY    . COLONOSCOPY N/A 05/05/2016   Procedure: COLONOSCOPY;  Surgeon: Daneil Dolin, MD;  Location: [facility];   Service: Endoscopy;  Laterality: N/A;  . ESOPHAGOGASTRODUODENOSCOPY N/A 05/04/2016   Procedure: ESOPHAGOGASTRODUODENOSCOPY (EGD);  Surgeon: Daneil Dolin, MD;  Location: [facility];  Service: Endoscopy;  Laterality: N/A;  . GIVENS CAPSULE STUDY N/A 05/06/2016   Procedure: GIVENS CAPSULE STUDY;  Surgeon: Daneil Dolin, MD;  Location: [facility];  Service: Endoscopy;  Laterality: N/A;  . KNEE SURGERY Left    arthroscopy  . RADICAL VULVECTOMY Left 04/09/2016   Procedure: RADICAL LEFT VULVECTOMY;  Surgeon: Everitt Amber, MD;  Location: [facility];  Service: Gynecology;  Laterality: Left;  . RADICAL VULVECTOMY  10/17/2016   Procedure: RADICAL ANTERIOR VULVECTOMY;  Surgeon: Everitt Amber, MD;  Location: [facility];  Service: Gynecology;;  . TOE SURGERY Left   . TONSILLECTOMY    . TOTAL HIP ARTHROPLASTY Left 10/27/2013   Procedure: LEFT TOTAL HIP ARTHROPLASTY;  Surgeon: Tobi Bastos, MD;  Location: [facility];  Service: Orthopedics;  Laterality: Left;  . TOTAL KNEE ARTHROPLASTY Right   . VULVA /PERINEUM BIOPSY  02/22/2016  . VULVECTOMY Right 02/27/2016   Procedure: RADICAL RIGHT VULVECTOMY WITH RIGHT INGUINAL LYMPH NODE DISSECTION;  Surgeon: Nancy Marus, MD;  Location: [facility];  Service: Gynecology;  Laterality: Right;    Past Medical Hx:  Past Medical History:  Diagnosis Date  . Aortic stenosis, mild   . Aortic valve stenosis, mild   . Arthritis   . Cancer ([place])    vulvar   . Complication of anesthesia   . Diabetes mellitus without complication ([place])    type 2  . Heart murmur   . History of blood transfusion age 7 or 28 months old  . History of radiation therapy 06/05/16 - 07/25/16   Pelvis treated to 45 Gy in 25 fractions, then Boosted an additional 18 Gy in 9 fractions.  . Hypertension   . IFG (impaired fasting glucose)   . PONV (postoperative nausea and vomiting)    years ago with back surgery-nausea  . Vitamin D deficiency     Past Gynecological History:  Remote hx of hysterectomy for  fibroids. No hx of abnromal paps (last pap remote) No LMP recorded. Patient has had a hysterectomy.  Family Hx:  Family History  Problem Relation Age of Onset  . Hypertension Mother   . Hypertension Father   . Throat cancer Father   . Cancer Sister        Breast  . Hypertension Brother     Review of  Systems:  Constitutional  Feels well,    ENT Normal appearing ears and nares bilaterally Skin/Breast  + vulvar pruritis. Cardiovascular  No chest pain, shortness of breath, or edema  Pulmonary  No cough or wheeze.  Gastro Intestinal  No nausea, vomitting, or diarrhoea. No bright red blood per rectum, no abdominal pain, change in bowel movement, or constipation.  Genito Urinary  No frequency, urgency, dysuria, no bleeding Musculo Skeletal  No myalgia, arthralgia, joint swelling or pain  Neurologic  No weakness, numbness, change in gait,  Psychology  No depression, anxiety, insomnia.   Vitals:  Blood pressure (!) 171/88, pulse 100, temperature 98.1 F (36.7 C), resp. rate 20, weight 197 lb (89.4 kg), SpO2 98 %.  Physical Exam: WD in NAD Neck  Supple NROM, without any enlargements.  Lymph Node Survey No cervical supraclavicular or inguinal adenopathy. Right groin incision well healed.  Cardiovascular  Pulse normal rate, regularity and rhythm. S1 and S2 normal.  Lungs  Clear to auscultation bilateraly, without wheezes/crackles/rhonchi. Good air movement.  Skin  No rash/lesions/breakdown  Psychiatry  Alert and oriented to person, place, and time  Abdomen  Normoactive bowel sounds, abdomen soft, non-tender and obese without evidence of hernia.  Back No CVA tenderness Genito Urinary  Vulva/vagina: Radiation changes present. Anterior mons incision in tact, persistent wound separation of peri-urethral incision but clean granulating base, no evidence of cellulitis, no evidence of recurrent tumor. There is a 2cm fluctuant mass to the right of the anterior mons incision, with  indurated base. The area of biopsy/drainage has resealed. No spreading erythema on surrounding skin. The area of hyperaesthesia on the right groin crease is free of lesions or masses.   Bladder/urethra:  No lesions or masses, well supported bladder  Vagina: atrophic, no lesions  Cervix: surgically absent  Uterus: surgically absen  Adnexa: no palpable masses. Rectal  deferred Extremities  No bilateral cyanosis, clubbing or edema.    Donaciano Eva, MD  03/17/2017, 3:29 PM

## 2017-03-17 NOTE — Patient Instructions (Addendum)
Please return to see Dr Denman George in 1 month to re-evaluate the infection on the skin. Use the numbing ointment on the right groin crease 4-5 times a day as needed (or less) for pain. Increase the neurontin (gabapentin) to three times per day.  Take the clindamycin antibiotic by mouth three times a day for 2 weeks.   Inform Dr Denman George if there is increasing pain on the skin or spreading redness to more of the skin.

## 2017-03-20 ENCOUNTER — Telehealth: Payer: Self-pay

## 2017-03-20 NOTE — Telephone Encounter (Signed)
[name] states that the area on the mons has a head of puss on it which has drained a little. The site is less red but still sore. The lidocaine solution is helping with the discomfort. She began the clindamycin 150 mg tabs Monday night 03-17-17. Afebrile. Melissa said that it is good that the puss is beginning to drain.  She can apply warm soaks to the affected area for 15 min. 3 times a day to help draw out and drain the bacteria. Melissa will contact Dr. Denman George  to see if she would recommend any additional ATB.  Dr. Denman George is in the OR today.  Melissa may call her later today or tomorrow. Pt to call or go to ED if she develops shaking  chills and or fever of 101 or higher.

## 2017-03-21 NOTE — Telephone Encounter (Signed)
Told [name] that Dr. Denman George is glad that the puss is draining.  No additional medication is needed at this time.  [name] states that she is feeling better today.

## 2017-03-28 ENCOUNTER — Telehealth: Payer: Self-pay | Admitting: Nurse Practitioner

## 2017-03-28 ENCOUNTER — Telehealth: Payer: Self-pay | Admitting: *Deleted

## 2017-03-28 ENCOUNTER — Other Ambulatory Visit: Payer: Self-pay | Admitting: Nurse Practitioner

## 2017-03-28 MED ORDER — HYDROCODONE-ACETAMINOPHEN 5-325 MG PO TABS: 1.0000 | ORAL_TABLET | Freq: Four times a day (QID) | ORAL | 0 refills | Status: DC | PRN

## 2017-03-28 NOTE — Progress Notes (Signed)
PMP reviewed

## 2017-03-28 NOTE — Telephone Encounter (Signed)
Pt requesting refill of her HYDROcodone-acetaminophen ([place]) 5-325 MG tablet  Please call pt when ready   [place]

## 2017-03-28 NOTE — Telephone Encounter (Signed)
CALLED PATIENT TO ASK ABOUT COMING IN FOR FU APPT. ON 04-07-17 @ 9:30 AM , INSTEAD OF 04-03-17 , DUE TO DR. KINARD BEING IN THE OR, PATIENT AGREED TO COME IN ON 04-07-17 @ 9:30 AM

## 2017-03-28 NOTE — Telephone Encounter (Signed)
Pt is aware.  

## 2017-03-28 NOTE — Telephone Encounter (Signed)
Done. Please notify

## 2017-04-01 DIAGNOSIS — M79675 Pain in left toe(s): Secondary | ICD-10-CM | POA: Diagnosis not present

## 2017-04-01 DIAGNOSIS — E1142 Type 2 diabetes mellitus with diabetic polyneuropathy: Secondary | ICD-10-CM | POA: Diagnosis not present

## 2017-04-02 ENCOUNTER — Encounter: Payer: Self-pay | Admitting: Nurse Practitioner

## 2017-04-02 ENCOUNTER — Ambulatory Visit (INDEPENDENT_AMBULATORY_CARE_PROVIDER_SITE_OTHER): Payer: PPO | Admitting: Nurse Practitioner

## 2017-04-02 VITALS — BP 120/72 | Temp 98.5°F | Ht 64.0 in | Wt 198.0 lb

## 2017-04-02 DIAGNOSIS — N764 Abscess of vulva: Secondary | ICD-10-CM | POA: Diagnosis not present

## 2017-04-02 MED ORDER — DOXYCYCLINE HYCLATE 100 MG PO TABS: 100.0000 mg | ORAL_TABLET | Freq: Two times a day (BID) | ORAL | 0 refills | Status: DC

## 2017-04-02 NOTE — Progress Notes (Signed)
Subjective:  Presents for recheck of an abscess on the right vulvar area. Her gyn has lanced the area and taken a biopsy which was negative. Just completed a course of Clindamycin. Applying warm compresses. Has seen slight improvement. Had some packing which she removed the day after the I&D as instructed. Continues to have daily drainage. Wears a light pad but cuts out a portion so it does not touch this area. Sensitive and tender to touch. Also has a small area on the inner labia that she rubs at after urination. Non tender.   Objective:   BP 120/72   Temp 98.5 F (36.9 C) (Oral)   Ht 5\' 4"  (1.626 m)   Wt 198 lb (89.8 kg)   BMI 33.99 kg/m  NAD. Alert, oriented. Edematous slightly pink area along the right vulva. A small open area about 2-3 mm in depth noted with slight yellowish purulent drainage. Wound culture obtained. Area is firm and moderately tender approximately 3-4 cm in diameter. Inner labia is slightly macerated from moisture. Faint ecchymoses at area of lower left inner labia with superficial peeling of the skin. No lesions or open areas noted.   Assessment:  Vulvar abscess - Plan: Wound culture    Plan:   Meds ordered this encounter  Medications  . doxycycline (VIBRA-TABS) 100 MG tablet    Sig: Take 1 tablet (100 mg total) by mouth 2 (two) times daily.    Dispense:  20 tablet    Refill:  0    Order Specific Question:   Supervising Provider    Answer:   Mikey Kirschner [[id number]]   Culture pending. Take Doxycyline with plenty of water. Call back if GI issues.  Restart A&D ointment to inner vulva to protect skin against excess moisture. warning signs reviewed regarding abscess. Call back if fever or if area becomes larger. follow up on Monday as planned with radiation oncologist.

## 2017-04-02 NOTE — Patient Instructions (Signed)

## 2017-04-03 ENCOUNTER — Ambulatory Visit: Payer: PPO | Admitting: Radiation Oncology

## 2017-04-05 LAB — WOUND CULTURE

## 2017-04-07 ENCOUNTER — Ambulatory Visit
Admission: RE | Admit: 2017-04-07 | Discharge: 2017-04-07 | Disposition: A | Discharge status: Home / Self Care | Payer: PPO | Source: Ambulatory Visit | Attending: Radiation Oncology | Admitting: Radiation Oncology

## 2017-04-07 ENCOUNTER — Other Ambulatory Visit: Payer: Self-pay

## 2017-04-07 ENCOUNTER — Telehealth: Payer: Self-pay | Admitting: Oncology

## 2017-04-07 ENCOUNTER — Encounter: Payer: Self-pay | Admitting: Radiation Oncology

## 2017-04-07 VITALS — BP 123/75 | HR 86 | Resp 18 | Wt 189.4 lb

## 2017-04-07 DIAGNOSIS — R300 Dysuria: Secondary | ICD-10-CM | POA: Insufficient documentation

## 2017-04-07 DIAGNOSIS — Z96642 Presence of left artificial hip joint: Secondary | ICD-10-CM | POA: Diagnosis not present

## 2017-04-07 DIAGNOSIS — E559 Vitamin D deficiency, unspecified: Secondary | ICD-10-CM | POA: Diagnosis not present

## 2017-04-07 DIAGNOSIS — Z7982 Long term (current) use of aspirin: Secondary | ICD-10-CM | POA: Insufficient documentation

## 2017-04-07 DIAGNOSIS — I10 Essential (primary) hypertension: Secondary | ICD-10-CM | POA: Diagnosis not present

## 2017-04-07 DIAGNOSIS — Z96651 Presence of right artificial knee joint: Secondary | ICD-10-CM | POA: Diagnosis not present

## 2017-04-07 DIAGNOSIS — Z886 Allergy status to analgesic agent status: Secondary | ICD-10-CM | POA: Diagnosis not present

## 2017-04-07 DIAGNOSIS — E119 Type 2 diabetes mellitus without complications: Secondary | ICD-10-CM | POA: Insufficient documentation

## 2017-04-07 DIAGNOSIS — C519 Malignant neoplasm of vulva, unspecified: Secondary | ICD-10-CM | POA: Diagnosis not present

## 2017-04-07 DIAGNOSIS — I350 Nonrheumatic aortic (valve) stenosis: Secondary | ICD-10-CM | POA: Diagnosis not present

## 2017-04-07 DIAGNOSIS — Z7984 Long term (current) use of oral hypoglycemic drugs: Secondary | ICD-10-CM | POA: Diagnosis not present

## 2017-04-07 DIAGNOSIS — Z885 Allergy status to narcotic agent status: Secondary | ICD-10-CM | POA: Insufficient documentation

## 2017-04-07 DIAGNOSIS — Z881 Allergy status to other antibiotic agents status: Secondary | ICD-10-CM | POA: Insufficient documentation

## 2017-04-07 DIAGNOSIS — N898 Other specified noninflammatory disorders of vagina: Secondary | ICD-10-CM | POA: Insufficient documentation

## 2017-04-07 DIAGNOSIS — Z79899 Other long term (current) drug therapy: Secondary | ICD-10-CM | POA: Insufficient documentation

## 2017-04-07 DIAGNOSIS — R1031 Right lower quadrant pain: Secondary | ICD-10-CM | POA: Diagnosis not present

## 2017-04-07 DIAGNOSIS — Z08 Encounter for follow-up examination after completed treatment for malignant neoplasm: Secondary | ICD-10-CM | POA: Diagnosis not present

## 2017-04-07 DIAGNOSIS — R600 Localized edema: Secondary | ICD-10-CM | POA: Diagnosis not present

## 2017-04-07 DIAGNOSIS — Z8544 Personal history of malignant neoplasm of other female genital organs: Secondary | ICD-10-CM | POA: Diagnosis not present

## 2017-04-07 DIAGNOSIS — Z90710 Acquired absence of both cervix and uterus: Secondary | ICD-10-CM | POA: Diagnosis not present

## 2017-04-07 DIAGNOSIS — N9089 Other specified noninflammatory disorders of vulva and perineum: Secondary | ICD-10-CM | POA: Diagnosis not present

## 2017-04-07 DIAGNOSIS — R1030 Lower abdominal pain, unspecified: Secondary | ICD-10-CM | POA: Diagnosis not present

## 2017-04-07 NOTE — Progress Notes (Addendum)
Radiation Oncology         [phone] ________________________________  Name: [name] MRN: [id number]  Date: 04/07/2017  DOB: 08/07/1940  Follow-Up Visit Note  CC: Mikey Kirschner, MD  Everitt Amber, MD    ICD-10-CM   1. 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 The patient then proceeded with a boost to the vulvar region the light of the positive surgical margin and continue to a dose of 63 gray.  Narrative:  The patient returns today for routine follow-up. She was last seen by Dr. Denman George on 03/17/17. Anterior mons caruncle noted and the patient declined repeat I&D. There was no spreading cellulitis. The patient was treated with empiric cleocin for 2 weeks and will follow-up with Dr. Denman George in one month. She presented to her PCP, Dr. Sumner Boast, on 04/02/17 for a recheck of an abscess on the right vulvar area. She reports culture of the wound was taken at this time, and she was started on Doxycycline. Her culture did not grow out anything but normal appearing skin flora. She has had issues ongoing with right groin pain and reports that this has progressed in the last week.   On review of symptoms, the patient reports a firey pain to the groin area, particularly on the right and she has to either prop her leg up or lie down to alleviate the pain. She denies relief with Lidocaine topically, or Vaseline. She is taking 100 mg of Gabapentin three times per day with no relief.She notes swelling to the vaginal area. She reports dysuria and clear vaginal discharge. She denies bowel issues, nausea, vomiting, vaginal or rectal bleeding. A complete review of systems is obtained and is otherwise negative.  Past Medical History:    Past Medical History:  Diagnosis Date  . Aortic stenosis, mild   . Aortic valve stenosis, mild   . Arthritis   . Cancer ([place])    vulvar   . Complication of anesthesia   . Diabetes mellitus without complication ([place])    type 2  . Heart murmur   . History of blood transfusion age 66 or 66 months old  . History of radiation therapy 06/05/16 - 07/25/16   Pelvis treated to 45 Gy in 25 fractions, then Boosted an additional 18 Gy in 9 fractions.  . Hypertension   . IFG (impaired fasting glucose)   . PONV (postoperative nausea and vomiting)    years ago with back surgery-nausea  . Vitamin D deficiency     Past Surgical History: Past Surgical History:  Procedure Laterality Date  . ABDOMINAL HYSTERECTOMY     complete  . BACK SURGERY     upper  with plate frontal fusion and lower x 2  . COLONOSCOPY    . COLONOSCOPY N/A 05/05/2016   Procedure: COLONOSCOPY;  Surgeon: Daneil Dolin, MD;  [facility];  Service: Endoscopy;  Laterality: N/A;  . ESOPHAGOGASTRODUODENOSCOPY N/A 05/04/2016   Procedure: ESOPHAGOGASTRODUODENOSCOPY (EGD);  Surgeon: Daneil Dolin, MD;  Location: [facility];  Service: Endoscopy;  Laterality: N/A;  . GIVENS CAPSULE STUDY N/A 05/06/2016   Procedure: GIVENS CAPSULE STUDY;  Surgeon: Daneil Dolin, MD;  Location: [facility];  Service: Endoscopy;  Laterality:  N/A;  . KNEE SURGERY Left    arthroscopy  . RADICAL VULVECTOMY Left 04/09/2016   Procedure: RADICAL LEFT VULVECTOMY;  Surgeon: Everitt Amber, MD;  Location: [facility];  Service: Gynecology;  Laterality: Left;  . RADICAL VULVECTOMY  10/17/2016   Procedure: RADICAL ANTERIOR VULVECTOMY;  Surgeon: Everitt Amber, MD;  Location: [facility];  Service: Gynecology;;  . TOE SURGERY Left   . TONSILLECTOMY    . TOTAL HIP ARTHROPLASTY Left 10/27/2013   Procedure: LEFT TOTAL HIP ARTHROPLASTY;  Surgeon: Tobi Bastos, MD;  Location: [facility];  Service: Orthopedics;  Laterality: Left;  . TOTAL KNEE ARTHROPLASTY Right   . VULVA  /PERINEUM BIOPSY  02/22/2016  . VULVECTOMY Right 02/27/2016   Procedure: RADICAL RIGHT VULVECTOMY WITH RIGHT INGUINAL LYMPH NODE DISSECTION;  Surgeon: Nancy Marus, MD;  Location: [facility];  Service: Gynecology;  Laterality: Right;    Social History:  Social History   Socioeconomic History  . Marital status: Married    Spouse name: Not on file  . Number of children: 2  . Years of education: Not on file  . Highest education level: Not on file  Social Needs  . Financial resource strain: Not on file  . Food insecurity - worry: Not on file  . Food insecurity - inability: Not on file  . Transportation needs - medical: Not on file  . Transportation needs - non-medical: Not on file  Occupational History  . Not on file  Tobacco Use  . Smoking status: Never Smoker  . Smokeless tobacco: Never Used  Substance and Sexual Activity  . Alcohol use: No    Alcohol/week: 0.0 oz  . Drug use: No  . Sexual activity: Not on file  Other Topics Concern  . Not on file  Social History Narrative  . Not on file    Family History: Family History  Problem Relation Age of Onset  . Hypertension Mother   . Hypertension Father   . Throat cancer Father   . Cancer Sister        Breast  . Hypertension Brother      ALLERGIES:  is allergic to oxycodone; celebrex [celecoxib]; doxycycline; iron; lodine [etodolac]; nabumetone; tetracycline; and xarelto [rivaroxaban].  Meds: Current Outpatient Medications  Medication Sig Dispense Refill  . aspirin 81 MG tablet Take 81 mg by mouth every evening.     Marland Kitchen doxycycline (VIBRA-TABS) 100 MG tablet Take 1 tablet (100 mg total) by mouth 2 (two) times daily. 20 tablet 0  . furosemide (LASIX) 40 MG tablet TAKE (1) TABLET BY MOUTH ONCE DAILY. 90 tablet 1  . gabapentin (NEURONTIN) 100 MG capsule TAKE 1 CAPSULE BY MOUTH THREE TIMES A DAY. 270 capsule 0  . HYDROcodone-acetaminophen (NORCO) 5-325 MG tablet Take 1 tablet by mouth every 6 (six) hours as needed for moderate  pain. 36 tablet 0  . lidocaine (XYLOCAINE) 4 % external solution Apply topically as needed. Apply to right groin crease as needed for pain. 50 mL 0  . losartan (COZAAR) 50 MG tablet TAKE ONE TABLET BY MOUTH ONCE DAILY. 90 tablet 1  . metFORMIN (GLUCOPHAGE) 500 MG tablet TAKE (1) TABLET BY MOUTH TWICE DAILY. 180 tablet 1  . Multiple Vitamins-Minerals (MULTIVITAMIN ADULT PO) Take 1 tablet by mouth daily. Women's 50+    . potassium chloride SA (K-DUR,KLOR-CON) 20 MEQ tablet TAKE 1 TABLET BY MOUTH ONCE A DAY. 90 tablet 0   No current facility-administered medications for this encounter.     Physical Findings:  weight  is 189 lb 6 oz (85.9 kg). Her blood pressure is 123/75 and her pulse is 86. Her respiration is 18 and oxygen saturation is 100%. .   In general this is a well appearing caucasian female in no acute distress. She's alert and oriented x4 and appropriate throughout the examination. Cardiopulmonary assessment is negative for acute distress and she exhibits normal effort. Pelvic exam reveals surgical alteration of the external female genialia. The labia majora on the right is surgically missing, and there is edema of the left labia majora. She does have hyperemic changes with ulceration of the right introitus consistent with prior radiotherapy and concern for possible recurrence of her cancer. Along the mons, there is a 7 mm palpable subcutaneous nodule approximately 2-3 cm above her anterior surgical scar. A 3 mm opening is noted in this area which extends approximately a centimeter deep No palpable or visible recurrence in the inguinal areas bilaterally.    Lab Findings: Lab Results  Component Value Date   WBC 4.8 11/25/2016   HGB 12.1 11/25/2016   HCT 38.3 11/25/2016   MCV 86 11/25/2016   PLT 221 11/25/2016    Radiographic Findings: No results found.  Impression:   1. 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 The patient has had  an additional recurrence along the anterior perineum since her radiation therapy with additional surgery (partial radical anterior vulvectomy). This surgery showed clear margins but close along the urethral margin. Exam today is somewhat concerning for necrotic tissue/ possible recurrence. She was encouraged to follow-up with Dr. Denman George on 04/23/17. She will follow-up in radiation oncology once biopsy results are noted and plan for treatment is determined by GYN Oncology.  2. Right groin pain. The patient appears to have neuropathic pain. We discussed increasing her Gabapentin to 300 mg TID. She was also given a new prescription for a higher strength lidocaine gel to try. We will follow this expectantly. We did place viscous Xylocaine on the patient's area of pain which gave her immediate relief while in the clinic.  ____________________________________ The above documentation reflects my direct findings during this shared patient visit.    Carola Rhine, PAC  -----------------------------------  Blair Promise, PhD, MD   This document serves as a record of services personally performed by Gery Pray, MD and Shona Simpson, PA-C. It was created on their behalf by Bethann Humble, a trained medical scribe. The creation of this record is based on the scribe's personal observations and the provider's statements to them. This document has been checked and approved by the attending provider.

## 2017-04-07 NOTE — Progress Notes (Signed)
[name] is here for her follow-up appointment. Patient states that she is having pain in her groin area. Patient has some swelling in her vaginal area. Skin is hyperpigmented. Patient denies any issues with her bowels. Patient states that she has burning with urination.Denies any nausea or vomiting. Denies any vaginal or rectal bleeding. States that she is  having vaginal discharge. States that the discharge is clear with no odor.  Vitals:   04/07/17 0924  BP: 123/75  Pulse: 86  Resp: 18  SpO2: 100%  Weight: 189 lb 6 oz (85.9 kg)   Wt Readings from Last 3 Encounters:  04/07/17 189 lb 6 oz (85.9 kg)  04/02/17 198 lb (89.8 kg)  03/17/17 197 lb (89.4 kg)

## 2017-04-07 NOTE — Telephone Encounter (Signed)
Called in prescription for lidocaine 2% jelly to [facility] for 4 (29ml) tubes, 0 refills. Called patient and let her know that it has been sent in.

## 2017-04-10 ENCOUNTER — Ambulatory Visit: Payer: PPO | Admitting: Family Medicine

## 2017-04-11 ENCOUNTER — Other Ambulatory Visit (HOSPITAL_COMMUNITY)
Admission: RE | Admit: 2017-04-11 | Discharge: 2017-04-11 | Disposition: A | Discharge status: Home / Self Care | Payer: PPO | Source: Ambulatory Visit | Attending: Nurse Practitioner | Admitting: Nurse Practitioner

## 2017-04-11 ENCOUNTER — Ambulatory Visit (INDEPENDENT_AMBULATORY_CARE_PROVIDER_SITE_OTHER): Payer: PPO | Admitting: Nurse Practitioner

## 2017-04-11 ENCOUNTER — Other Ambulatory Visit: Payer: Self-pay | Admitting: Family Medicine

## 2017-04-11 ENCOUNTER — Other Ambulatory Visit: Payer: Self-pay

## 2017-04-11 ENCOUNTER — Encounter: Payer: Self-pay | Admitting: Nurse Practitioner

## 2017-04-11 VITALS — BP 120/82 | Ht 64.0 in | Wt 198.0 lb

## 2017-04-11 DIAGNOSIS — R5383 Other fatigue: Secondary | ICD-10-CM | POA: Insufficient documentation

## 2017-04-11 DIAGNOSIS — R102 Pelvic and perineal pain unspecified side: Secondary | ICD-10-CM

## 2017-04-11 DIAGNOSIS — D649 Anemia, unspecified: Secondary | ICD-10-CM | POA: Diagnosis not present

## 2017-04-11 DIAGNOSIS — N9089 Other specified noninflammatory disorders of vulva and perineum: Secondary | ICD-10-CM

## 2017-04-11 LAB — BASIC METABOLIC PANEL
ANION GAP: 13 (ref 5–15)
BUN: 33 mg/dL — AB (ref 6–20)
CHLORIDE: 103 mmol/L (ref 101–111)
CO2: 25 mmol/L (ref 22–32)
Calcium: 9.4 mg/dL (ref 8.9–10.3)
Creatinine, Ser: 0.81 mg/dL (ref 0.44–1.00)
GFR calc Af Amer: >60 mL/min (ref >60)
GFR calc non Af Amer: >60 mL/min (ref >60)
GLUCOSE: 128 mg/dL — AB (ref 65–99)
Potassium: 3.5 mmol/L (ref 3.5–5.1)
Sodium: 141 mmol/L (ref 135–145)

## 2017-04-11 LAB — POCT HEMOGLOBIN: HEMOGLOBIN: 10.2 g/dL — AB (ref 12.2–16.2)

## 2017-04-11 LAB — CBC WITH DIFFERENTIAL/PLATELET
BASOS ABS: 0 10*3/uL (ref 0.0–0.1)
Basophils Relative: 0 %
EOS ABS: 0.1 10*3/uL (ref 0.0–0.7)
Eosinophils Relative: 1 %
HCT: 29 % — ABNORMAL LOW (ref 36.0–46.0)
HEMOGLOBIN: 9.2 g/dL — AB (ref 12.0–15.0)
LYMPHS ABS: 0.9 10*3/uL (ref 0.7–4.0)
Lymphocytes Relative: 13 %
MCH: 27.9 pg (ref 26.0–34.0)
MCHC: 31.7 g/dL (ref 30.0–36.0)
MCV: 87.9 fL (ref 78.0–100.0)
Monocytes Absolute: 0.7 10*3/uL (ref 0.1–1.0)
Monocytes Relative: 9 %
NEUTROS PCT: 77 %
Neutro Abs: 5.3 10*3/uL (ref 1.7–7.7)
PLATELETS: 251 10*3/uL (ref 150–400)
RBC: 3.3 MIL/uL — AB (ref 3.87–5.11)
RDW: 14.2 % (ref 11.5–15.5)
WBC: 6.9 10*3/uL (ref 4.0–10.5)

## 2017-04-11 LAB — HEPATIC FUNCTION PANEL
ALBUMIN: 3.7 g/dL (ref 3.5–5.0)
ALK PHOS: 66 U/L (ref 38–126)
ALT: 17 U/L (ref 14–54)
AST: 20 U/L (ref 15–41)
Bilirubin, Direct: 0.1 mg/dL (ref 0.1–0.5)
Indirect Bilirubin: 0.6 mg/dL (ref 0.3–0.9)
TOTAL PROTEIN: 7.1 g/dL (ref 6.5–8.1)
Total Bilirubin: 0.7 mg/dL (ref 0.3–1.2)

## 2017-04-11 MED ORDER — FERROUS SULFATE 325 (65 FE) MG PO TABS: 325.0000 mg | ORAL_TABLET | Freq: Every day | ORAL | 1 refills | Status: DC

## 2017-04-11 MED ORDER — HYDROCODONE-ACETAMINOPHEN 5-325 MG PO TABS: 1.0000 | ORAL_TABLET | Freq: Four times a day (QID) | ORAL | 0 refills | Status: DC | PRN

## 2017-04-11 MED ORDER — GABAPENTIN 300 MG PO CAPS: 300.0000 mg | ORAL_CAPSULE | Freq: Three times a day (TID) | ORAL | 2 refills | Status: DC

## 2017-04-11 NOTE — Patient Instructions (Signed)
Will send in new prescription for pain pills Increase Gabapentin to 3 pills 3 times per day (will send in new Rx)

## 2017-04-12 ENCOUNTER — Encounter: Payer: Self-pay | Admitting: Nurse Practitioner

## 2017-04-12 DIAGNOSIS — R102 Pelvic and perineal pain: Secondary | ICD-10-CM | POA: Insufficient documentation

## 2017-04-12 NOTE — Progress Notes (Signed)
Subjective: Presents for complaints of fatigue that began about 2 weeks ago.  States she feels the same way she did back when she was diagnosed with severe anemia and GI bleed.  No reflux symptoms.  No abdominal pain.  No fevers.  No constipation or diarrhea.  No change in the color of her stools.  No bloody stools.  No change in her diet or appetite.  No chest pain/ischemic type pain or shortness of breath.  No numbness or weakness of the face arms or legs.  No difficulty speaking or swallowing.  Continues to have severe pain when standing or walking in the labial area at the site of her previous surgeries.  Also has trouble sitting or putting her legs together due to pain.  States some days are better than others.  On severe days she needs to take her pain medicine 2-3 times per day.  Has increased her gabapentin to 200 mg 3 times daily.  Saw a radiation oncologist on Monday.  He also prescribed a topical lidocaine solution which causes burning in the area.  Was also given a similar topical by Dr. Denman George which caused similar symptoms.  Has a follow-up appointment with Dr. Denman George on 4/3.  Taking doxycycline with food  without difficulty.  See previous note.  Objective:   BP 120/82   Ht 5\' 4"  (1.626 m)   Wt 198 lb (89.8 kg)   BMI 33.99 kg/m  NAD.  Alert, oriented.  Lungs clear.  Heart regular rate and rhythm.  No change in murmur.  EKG normal limit.  Abdomen soft nondistended with active bowel sounds; nontender to palpation.  No rebound or guarding.  No obvious masses.  External GU continues to have a 2-3 cm firm mass at the top of the right labia with a continued tiny opening with a minimal amount of whitish thick discharge.  Pinkish discoloration has greatly improved.  Area remains tender with specific tenderness towards the mid to lower right labia including area with thick scar tissue from previous surgery.  Inner labia clear, improved from previous exam.  Assessment:   Problem List Items Addressed This  Visit      Genitourinary   Caruncle of labium     Other   Vulvar pain    Other Visit Diagnoses    Other fatigue    -  Primary   Relevant Orders   POCT hemoglobin (Completed)   PR ELECTROCARDIOGRAM, COMPLETE   CBC with Differential/Platelet   Basic metabolic panel   Hepatic function panel   Anemia, unspecified type       Relevant Orders   CBC with Differential/Platelet       Plan:   Meds ordered this encounter  Medications  . HYDROcodone-acetaminophen (NORCO) 5-325 MG tablet    Sig: Take 1 tablet by mouth every 6 (six) hours as needed for moderate pain.    Dispense:  60 tablet    Refill:  0    Please charge her account and deliver after 2 pm per patient request    Order Specific Question:   Supervising Provider    Answer:   Mikey Kirschner [[id number]]  . gabapentin (NEURONTIN) 300 MG capsule    Sig: Take 1 capsule (300 mg total) by mouth 3 (three) times daily.    Dispense:  90 capsule    Refill:  2    Please charge her account and deliver after 2 pm per patient request    Order Specific Question:   Supervising  Provider    Answer:   Maggie Font   Stat labs pending.  Further follow-up based on results later today.  Increase number of hydrocodone per month to allow increased dosing on the days her pain is most severe.  Increase gabapentin to 300 mg 3 times daily.  Drowsiness precautions for medications.  Follow-up with Dr. Denman George as planned.  Warning signs reviewed.  Call back or go to ED sooner if symptoms worsen.   25 minutes was spent with the patient.  This statement verifies that 25 minutes was indeed spent with the patient. Greater than half the time was spent in discussion, counseling and answering questions  regarding the issues that the patient came in for today as reflected in the diagnosis (s) please refer to documentation for further details.

## 2017-04-14 ENCOUNTER — Other Ambulatory Visit (INDEPENDENT_AMBULATORY_CARE_PROVIDER_SITE_OTHER): Payer: PPO

## 2017-04-14 ENCOUNTER — Encounter: Payer: Self-pay | Admitting: Family Medicine

## 2017-04-14 DIAGNOSIS — D649 Anemia, unspecified: Secondary | ICD-10-CM | POA: Diagnosis not present

## 2017-04-14 DIAGNOSIS — K921 Melena: Secondary | ICD-10-CM

## 2017-04-14 LAB — HEMOCCULT GUIAC POC 1CARD (OFFICE)
Card #2 Fecal Occult Blod, POC: POSITIVE
Card #3 Fecal Occult Blood, POC: POSITIVE
Fecal Occult Blood, POC: POSITIVE — AB

## 2017-04-15 ENCOUNTER — Encounter: Payer: Self-pay | Admitting: Family Medicine

## 2017-04-15 ENCOUNTER — Other Ambulatory Visit: Payer: Self-pay | Admitting: Family Medicine

## 2017-04-15 NOTE — Addendum Note (Signed)
Addended by: Karle Barr on: 04/15/2017 10:12 AM   Modules accepted: Orders

## 2017-04-15 NOTE — Progress Notes (Signed)
amb  

## 2017-04-16 ENCOUNTER — Encounter: Payer: Self-pay | Admitting: Internal Medicine

## 2017-04-18 DIAGNOSIS — D649 Anemia, unspecified: Secondary | ICD-10-CM | POA: Diagnosis not present

## 2017-04-19 LAB — IRON: IRON: 121 ug/dL (ref 27–139)

## 2017-04-19 LAB — CBC WITH DIFFERENTIAL/PLATELET
BASOS: 1 %
Basophils Absolute: 0 10*3/uL (ref 0.0–0.2)
EOS (ABSOLUTE): 0.1 10*3/uL (ref 0.0–0.4)
Eos: 1 %
Hematocrit: 21.3 % — ABNORMAL LOW (ref 34.0–46.6)
Hemoglobin: 6.9 g/dL — CL (ref 11.1–15.9)
Immature Grans (Abs): 0 10*3/uL (ref 0.0–0.1)
Immature Granulocytes: 1 %
Lymphocytes Absolute: 0.8 10*3/uL (ref 0.7–3.1)
Lymphs: 14 %
MCH: 28 pg (ref 26.6–33.0)
MCHC: 32.4 g/dL (ref 31.5–35.7)
MCV: 87 fL (ref 79–97)
MONOS ABS: 0.5 10*3/uL (ref 0.1–0.9)
Monocytes: 8 %
NEUTROS ABS: 4.5 10*3/uL (ref 1.4–7.0)
Neutrophils: 75 %
Platelets: 277 10*3/uL (ref 150–379)
RBC: 2.46 x10E6/uL — CL (ref 3.77–5.28)
RDW: 15.3 % (ref 12.3–15.4)
WBC: 5.9 10*3/uL (ref 3.4–10.8)

## 2017-04-19 LAB — FERRITIN: Ferritin: 9 ng/mL — ABNORMAL LOW (ref 15–150)

## 2017-04-20 ENCOUNTER — Other Ambulatory Visit: Payer: Self-pay

## 2017-04-20 ENCOUNTER — Inpatient Hospital Stay (HOSPITAL_COMMUNITY)
Admission: EM | Admit: 2017-04-20 | Discharge: 2017-04-22 | DRG: 379 | Disposition: A | Discharge status: Home / Self Care | Payer: PPO | Attending: Internal Medicine | Admitting: Internal Medicine

## 2017-04-20 ENCOUNTER — Telehealth: Payer: Self-pay | Admitting: Family Medicine

## 2017-04-20 ENCOUNTER — Encounter (HOSPITAL_COMMUNITY): Payer: Self-pay

## 2017-04-20 DIAGNOSIS — Z8711 Personal history of peptic ulcer disease: Secondary | ICD-10-CM

## 2017-04-20 DIAGNOSIS — Z808 Family history of malignant neoplasm of other organs or systems: Secondary | ICD-10-CM | POA: Diagnosis not present

## 2017-04-20 DIAGNOSIS — E785 Hyperlipidemia, unspecified: Secondary | ICD-10-CM | POA: Diagnosis not present

## 2017-04-20 DIAGNOSIS — Z96642 Presence of left artificial hip joint: Secondary | ICD-10-CM | POA: Diagnosis not present

## 2017-04-20 DIAGNOSIS — D509 Iron deficiency anemia, unspecified: Secondary | ICD-10-CM

## 2017-04-20 DIAGNOSIS — Z888 Allergy status to other drugs, medicaments and biological substances status: Secondary | ICD-10-CM | POA: Diagnosis not present

## 2017-04-20 DIAGNOSIS — E559 Vitamin D deficiency, unspecified: Secondary | ICD-10-CM | POA: Diagnosis not present

## 2017-04-20 DIAGNOSIS — I10 Essential (primary) hypertension: Secondary | ICD-10-CM | POA: Diagnosis present

## 2017-04-20 DIAGNOSIS — Z8249 Family history of ischemic heart disease and other diseases of the circulatory system: Secondary | ICD-10-CM

## 2017-04-20 DIAGNOSIS — E119 Type 2 diabetes mellitus without complications: Secondary | ICD-10-CM | POA: Diagnosis not present

## 2017-04-20 DIAGNOSIS — Z881 Allergy status to other antibiotic agents status: Secondary | ICD-10-CM | POA: Diagnosis not present

## 2017-04-20 DIAGNOSIS — Z96651 Presence of right artificial knee joint: Secondary | ICD-10-CM | POA: Diagnosis present

## 2017-04-20 DIAGNOSIS — Z981 Arthrodesis status: Secondary | ICD-10-CM | POA: Diagnosis not present

## 2017-04-20 DIAGNOSIS — Z86010 Personal history of colonic polyps: Secondary | ICD-10-CM | POA: Diagnosis not present

## 2017-04-20 DIAGNOSIS — Z79891 Long term (current) use of opiate analgesic: Secondary | ICD-10-CM | POA: Diagnosis not present

## 2017-04-20 DIAGNOSIS — Z90710 Acquired absence of both cervix and uterus: Secondary | ICD-10-CM

## 2017-04-20 DIAGNOSIS — Z7984 Long term (current) use of oral hypoglycemic drugs: Secondary | ICD-10-CM

## 2017-04-20 DIAGNOSIS — D500 Iron deficiency anemia secondary to blood loss (chronic): Secondary | ICD-10-CM | POA: Diagnosis present

## 2017-04-20 DIAGNOSIS — D649 Anemia, unspecified: Secondary | ICD-10-CM

## 2017-04-20 DIAGNOSIS — K922 Gastrointestinal hemorrhage, unspecified: Secondary | ICD-10-CM | POA: Diagnosis not present

## 2017-04-20 DIAGNOSIS — M1990 Unspecified osteoarthritis, unspecified site: Secondary | ICD-10-CM | POA: Diagnosis present

## 2017-04-20 DIAGNOSIS — R011 Cardiac murmur, unspecified: Secondary | ICD-10-CM | POA: Diagnosis not present

## 2017-04-20 DIAGNOSIS — Z8544 Personal history of malignant neoplasm of other female genital organs: Secondary | ICD-10-CM

## 2017-04-20 DIAGNOSIS — R195 Other fecal abnormalities: Secondary | ICD-10-CM | POA: Diagnosis not present

## 2017-04-20 DIAGNOSIS — Z79899 Other long term (current) drug therapy: Secondary | ICD-10-CM

## 2017-04-20 DIAGNOSIS — Z923 Personal history of irradiation: Secondary | ICD-10-CM | POA: Diagnosis not present

## 2017-04-20 DIAGNOSIS — Z7982 Long term (current) use of aspirin: Secondary | ICD-10-CM | POA: Diagnosis not present

## 2017-04-20 DIAGNOSIS — K644 Residual hemorrhoidal skin tags: Secondary | ICD-10-CM | POA: Diagnosis present

## 2017-04-20 DIAGNOSIS — I350 Nonrheumatic aortic (valve) stenosis: Secondary | ICD-10-CM | POA: Diagnosis not present

## 2017-04-20 DIAGNOSIS — K921 Melena: Principal | ICD-10-CM | POA: Diagnosis present

## 2017-04-20 LAB — COMPREHENSIVE METABOLIC PANEL
ALT: 13 U/L — AB (ref 14–54)
ANION GAP: 8 (ref 5–15)
AST: 14 U/L — ABNORMAL LOW (ref 15–41)
Albumin: 3.2 g/dL — ABNORMAL LOW (ref 3.5–5.0)
Alkaline Phosphatase: 58 U/L (ref 38–126)
BUN: 18 mg/dL (ref 6–20)
CHLORIDE: 107 mmol/L (ref 101–111)
CO2: 27 mmol/L (ref 22–32)
Calcium: 8.8 mg/dL — ABNORMAL LOW (ref 8.9–10.3)
Creatinine, Ser: 0.72 mg/dL (ref 0.44–1.00)
GFR calc non Af Amer: >60 mL/min (ref >60)
Glucose, Bld: 123 mg/dL — ABNORMAL HIGH (ref 65–99)
Potassium: 3.8 mmol/L (ref 3.5–5.1)
SODIUM: 142 mmol/L (ref 135–145)
Total Bilirubin: 0.3 mg/dL (ref 0.3–1.2)
Total Protein: 6 g/dL — ABNORMAL LOW (ref 6.5–8.1)

## 2017-04-20 LAB — CBC
HCT: 17.8 % — ABNORMAL LOW (ref 36.0–46.0)
HEMOGLOBIN: 5.5 g/dL — AB (ref 12.0–15.0)
MCH: 27.9 pg (ref 26.0–34.0)
MCHC: 30.9 g/dL (ref 30.0–36.0)
MCV: 90.4 fL (ref 78.0–100.0)
Platelets: 199 10*3/uL (ref 150–400)
RBC: 1.97 MIL/uL — AB (ref 3.87–5.11)
RDW: 15.2 % (ref 11.5–15.5)
WBC: 5.3 10*3/uL (ref 4.0–10.5)

## 2017-04-20 LAB — POC OCCULT BLOOD, ED: Fecal Occult Bld: POSITIVE — AB

## 2017-04-20 LAB — PREPARE RBC (CROSSMATCH)

## 2017-04-20 MED ORDER — SODIUM CHLORIDE 0.9 % IV SOLN
INTRAVENOUS | Status: DC
  Administered 2017-04-20 – 2017-04-22 (×2): via INTRAVENOUS

## 2017-04-20 MED ORDER — PANTOPRAZOLE SODIUM 40 MG IV SOLR
40.0000 mg | Freq: Two times a day (BID) | INTRAVENOUS | Status: DC
  Administered 2017-04-20 – 2017-04-21 (×2): 40 mg via INTRAVENOUS
  Filled 2017-04-20 (×2): qty 40

## 2017-04-20 MED ORDER — SODIUM CHLORIDE 0.9 % IV SOLN
Freq: Once | INTRAVENOUS | Status: AC
  Administered 2017-04-20: 15:00:00 via INTRAVENOUS

## 2017-04-20 NOTE — ED Notes (Signed)
Bed: KG88 Expected date:  Expected time:  Means of arrival:  Comments: Cancel

## 2017-04-20 NOTE — Telephone Encounter (Signed)
I discussed the results of the low hemoglobin with the patient.  She states she is feeling very weak.  She states her husband can take her to the hospital.  She is undergoing cancer therapy at [facility].  She would like to go to that ER.  I spoke with the charge nurse and gave her a rundown of the patient's history and low hemoglobin.  The patient will need evaluation and treatment and probable transfusion.  The patient is in agreement with this and will proceed to ER later this morning

## 2017-04-20 NOTE — ED Notes (Signed)
Date and time results received: 04/20/17 1448 (use smartphrase ".now" to insert current time)  Test: Hgb Critical Value: 5.5  Name of Provider Notified: Kandice Moos and Eulis Foster  Orders Received? Or Actions Taken?: Actions Taken: Eulis Foster and [place] notified

## 2017-04-20 NOTE — H&P (Signed)
History and Physical    [name] KXF:[id number] DOB: August 04, 1940 DOA: 04/20/2017  PCP: Mikey Kirschner, MD Patient coming from: Home Chief Complaint: Generalized weakness and shortness of breath HPI: [name] is a 77 y.o. female with medical history significant of vulvar cancer, diabetes type 2, history of chronic heart murmur, history of radiation therapy to the pelvis, aortic stenosis mild, admitted with complaints of generalized weakness and shortness of breath.  A week ago her hemoglobin was 9 and today is come down to 5.5.  Patient reports that her stool has been black for over 3 weeks.  She was found to be FOBT positive prior to admission.  On 3 separate stool collection cards.  She denies chest pain.  However she does feel short of breath.  No fever chills nausea vomiting diarrhea abdominal pain back pain headache changes with her vision.  No hematemesis.  Reports melena.  History of peptic ulcer disease.  She takes aspirin daily 81 mg.    ED Course: Her hemoglobin was 5.5 hematocrit 17.8 WBC 5.3 platelet count 199.  Fecal occult blood test was again positive today.  ER physician spoke with GI Dr. Ardis Hughs and he will see the patient tomorrow.  2 units of blood transfusion has been ordered.  Vital signs are 113/55 pulse is 92 respiration 17 saturation 97 on room air temperature 99.  Review of Systems: As per HPI otherwise all other systems reviewed and are negative  Ambulatory Status:  Past Medical History:  Diagnosis Date  . Aortic stenosis, mild   . Aortic valve stenosis, mild   . Arthritis   . Cancer ([place])    vulvar   . Complication of anesthesia   . Diabetes mellitus without complication ([place])    type 2  . Heart murmur   . History of blood transfusion age 36 or 85 months old  . History of radiation therapy 06/05/16 - 07/25/16   Pelvis treated to 45 Gy in 25 fractions, then Boosted an additional 18 Gy in 9 fractions.  . Hypertension   . IFG (impaired fasting glucose)    . PONV (postoperative nausea and vomiting)    years ago with back surgery-nausea  . Vitamin D deficiency     Past Surgical History:  Procedure Laterality Date  . ABDOMINAL HYSTERECTOMY     complete  . BACK SURGERY     upper  with plate frontal fusion and lower x 2  . COLONOSCOPY    . COLONOSCOPY N/A 05/05/2016   Procedure: COLONOSCOPY;  Surgeon: Daneil Dolin, MD;  [facility];  Service: Endoscopy;  Laterality: N/A;  . ESOPHAGOGASTRODUODENOSCOPY N/A 05/04/2016   Procedure: ESOPHAGOGASTRODUODENOSCOPY (EGD);  Surgeon: Daneil Dolin, MD;  [facility];  Service: Endoscopy;  Laterality: N/A;  . GIVENS CAPSULE STUDY N/A 05/06/2016   Procedure: GIVENS CAPSULE STUDY;  Surgeon: Daneil Dolin, MD;  [facility];  Service: Endoscopy;  Laterality: N/A;  . KNEE SURGERY Left    arthroscopy  . RADICAL VULVECTOMY Left 04/09/2016   Procedure: RADICAL LEFT VULVECTOMY;  Surgeon: Everitt Amber, MD;  Location: [facility];  Service: Gynecology;  Laterality: Left;  . RADICAL VULVECTOMY  10/17/2016   Procedure: RADICAL ANTERIOR VULVECTOMY;  Surgeon: Everitt Amber, MD;  Location: [facility];  Service: Gynecology;;  . TOE SURGERY Left   . TONSILLECTOMY    . TOTAL HIP ARTHROPLASTY Left 10/27/2013   Procedure: LEFT TOTAL HIP ARTHROPLASTY;  Surgeon: Tobi Bastos, MD;  [facility];  Service: Orthopedics;  Laterality: Left;  . TOTAL KNEE ARTHROPLASTY Right   . VULVA /PERINEUM BIOPSY  02/22/2016  . VULVECTOMY Right 02/27/2016   Procedure: RADICAL RIGHT VULVECTOMY WITH RIGHT INGUINAL LYMPH NODE DISSECTION;  Surgeon: Nancy Marus, MD;  Location: [facility];  Service: Gynecology;  Laterality: Right;    Social History   Socioeconomic History  . Marital status: Married    Spouse name: Not on file  . Number of children: 2  . Years of education: Not on file  . Highest education level: Not on file  Occupational History  . Not on file  Social Needs  . Financial resource strain: Not on  file  . Food insecurity:    Worry: Not on file    Inability: Not on file  . Transportation needs:    Medical: Not on file    Non-medical: Not on file  Tobacco Use  . Smoking status: Never Smoker  . Smokeless tobacco: Never Used  Substance and Sexual Activity  . Alcohol use: No    Alcohol/week: 0.0 oz  . Drug use: No  . Sexual activity: Not on file  Lifestyle  . Physical activity:    Days per week: Not on file    Minutes per session: Not on file  . Stress: Not on file  Relationships  . Social connections:    Talks on phone: Not on file    Gets together: Not on file    Attends religious service: Not on file    Active member of club or organization: Not on file    Attends meetings of clubs or organizations: Not on file    Relationship status: Not on file  . Intimate partner violence:    Fear of current or ex partner: Not on file    Emotionally abused: Not on file    Physically abused: Not on file    Forced sexual activity: Not on file  Other Topics Concern  . Not on file  Social History Narrative  . Not on file    Allergies  Allergen Reactions  . Oxycodone Other (See Comments)    Intense H/A  . Celebrex [Celecoxib] Other (See Comments)    GI upset   . Doxycycline Nausea Only    Gi upset Pt is able to tolerate with food  . Iron Other (See Comments)    Pt cannot tolerate some iron preparations. The upset her stomach.  . Lodine [Etodolac] Nausea Only    Gi upset  . Nabumetone Other (See Comments)    (relafen) unknown  . Tetracycline Nausea And Vomiting  . Xarelto [Rivaroxaban] Hives    Family History  Problem Relation Age of Onset  . Hypertension Mother   . Hypertension Father   . Throat cancer Father   . Cancer Sister        Breast  . Hypertension Brother      Prior to Admission medications   Medication Sig Start Date End Date Taking? Authorizing Provider  aspirin 81 MG tablet Take 81 mg by mouth every evening.    Yes [provider]    ferrous sulfate 325 (65 FE) MG tablet Take 1 tablet (325 mg total) by mouth daily with breakfast. 04/11/17  Yes Nilda Simmer, NP  furosemide (LASIX) 40 MG tablet TAKE (1) TABLET BY MOUTH ONCE DAILY. 02/07/17  Yes Mikey Kirschner, MD  gabapentin (NEURONTIN) 300 MG capsule Take 1 capsule (300 mg total) by mouth 3 (three) times daily.  04/11/17  Yes Nilda Simmer, NP  HYDROcodone-acetaminophen (NORCO) 5-325 MG tablet Take 1 tablet by mouth every 6 (six) hours as needed for moderate pain. 04/11/17  Yes Nilda Simmer, NP  losartan (COZAAR) 50 MG tablet TAKE ONE TABLET BY MOUTH ONCE DAILY. 12/23/16  Yes Mikey Kirschner, MD  metFORMIN (GLUCOPHAGE) 500 MG tablet TAKE (1) TABLET BY MOUTH TWICE DAILY. 04/15/17  Yes Mikey Kirschner, MD  potassium chloride SA (K-DUR,KLOR-CON) 20 MEQ tablet TAKE 1 TABLET BY MOUTH ONCE A DAY. 11/29/16  Yes Mikey Kirschner, MD  doxycycline (VIBRA-TABS) 100 MG tablet Take 1 tablet (100 mg total) by mouth 2 (two) times daily. Patient not taking: Reported on 04/20/2017 04/02/17   Nilda Simmer, NP  lidocaine (XYLOCAINE) 4 % external solution Apply topically as needed. Apply to right groin crease as needed for pain. Patient not taking: Reported on 04/20/2017 03/17/17   Everitt Amber, MD    Physical Exam: Vitals:   04/20/17 1516 04/20/17 1548 04/20/17 1600 04/20/17 1615  BP: (!) 102/48 (!) 102/48 (!) 99/54 (!) 108/56  Pulse: 98 93 93 (!) 101  Resp: 10 (!) 21 16 16   Temp:    99 F (37.2 C)  TempSrc:    Oral  SpO2: 100% 95% 94% 98%     General: Appears calm and comfortable Eyes:  PERRL, EOMI, normal lids, conjunctiva pale. ENT:  grossly normal hearing, lips & tongue, mmm dry. Neck:  no LAD, masses or thyromegaly Cardiovascular:  RRR, no m/r/g. No LE edema.  Respiratory:  CTA bilaterally, no w/r/r. Normal respiratory effort. Abdomen:  soft, ntnd, NABS Skin:  no rash or induration seen on limited exam Musculoskeletal:  grossly normal tone BUE/BLE, good  ROM, no bony abnormality Psychiatric:  grossly normal mood and affect, speech fluent and appropriate, AOx3 Neurologic: CN 2-12 grossly intact, moves all extremities in coordinated fashion, sensation intact  Labs on Admission: I have personally reviewed following labs and imaging studies  CBC: Recent Labs  Lab 04/18/17 0943 04/20/17 1410  WBC 5.9 5.3  NEUTROABS 4.5  --   HGB 6.9* 5.5*  HCT 21.3* 17.8*  MCV 87 90.4  PLT 277 427   Basic Metabolic Panel: Recent Labs  Lab 04/20/17 1410  NA 142  K 3.8  CL 107  CO2 27  GLUCOSE 123*  BUN 18  CREATININE 0.72  CALCIUM 8.8*   GFR: Estimated Creatinine Clearance: 63.9 mL/min (by C-G formula based on SCr of 0.72 mg/dL). Liver Function Tests: Recent Labs  Lab 04/20/17 1410  AST 14*  ALT 13*  ALKPHOS 58  BILITOT 0.3  PROT 6.0*  ALBUMIN 3.2*   No results for input(s): LIPASE, AMYLASE in the last 168 hours. No results for input(s): AMMONIA in the last 168 hours. Coagulation Profile: No results for input(s): INR, PROTIME in the last 168 hours. Cardiac Enzymes: No results for input(s): CKTOTAL, CKMB, CKMBINDEX, TROPONINI in the last 168 hours. BNP (last 3 results) No results for input(s): PROBNP in the last 8760 hours. HbA1C: No results for input(s): HGBA1C in the last 72 hours. CBG: No results for input(s): GLUCAP in the last 168 hours. Lipid Profile: No results for input(s): CHOL, HDL, LDLCALC, TRIG, CHOLHDL, LDLDIRECT in the last 72 hours. Thyroid Function Tests: No results for input(s): TSH, T4TOTAL, FREET4, T3FREE, THYROIDAB in the last 72 hours. Anemia Panel: Recent Labs    04/18/17 0943  FERRITIN 9*  IRON 121   Urine analysis:    Component Value Date/Time  COLORURINE YELLOW 05/03/2016 1316   APPEARANCEUR HAZY (A) 05/03/2016 1316   LABSPEC 1.025 05/03/2016 1316   PHURINE 5.0 05/03/2016 1316   GLUCOSEU NEGATIVE 05/03/2016 1316   HGBUR NEGATIVE 05/03/2016 1316   BILIRUBINUR NEGATIVE 05/03/2016 1316    KETONESUR NEGATIVE 05/03/2016 1316   PROTEINUR 30 (A) 05/03/2016 1316   UROBILINOGEN 0.2 10/21/2013 1124   NITRITE NEGATIVE 05/03/2016 1316   LEUKOCYTESUR LARGE (A) 05/03/2016 1316    Creatinine Clearance: Estimated Creatinine Clearance: 63.9 mL/min (by C-G formula based on SCr of 0.72 mg/dL).  Sepsis Labs: @LABRCNTIP (procalcitonin:4,lacticidven:4) )No results found for this or any previous visit (from the past 240 hour(s)).   Radiological Exams on Admission: No results found.  EKG: Independently reviewed.   Assessment/Plan Active Problems:   * No active hospital problems. *   1] upper GI bleed -patient presented with complaints of generalized weakness and dyspnea on exertion with drop in hemoglobin 6.9 two days ago to 5.5 today.  And melena.  She has had EGD and colonoscopy year ago I do not have the results of them today.  2 units of blood transfusion has been ordered.  I will also started on a PPI.  Ardis Hughs will see the patient tomorrow.  She has been slightly tachycardic rate about 100 which is condemned to 80 with IV fluids and blood transfusion.  Blood pressure improved to 113/55.  Saturation is 97% on room air.  She takes aspirin 81 mg daily at home which will be on hold.  I will also hold her Lasix, losartan.  Patient is kept n.p.o. except for meds with sips.  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  Status post adjuvant vulvar and inguinal radiation.  And radical right vulvectomy and inguinal lymphadenectomy.  Followed by GYN onc.    DVT prophylaxis: SCD Code Status: Full code Family Communication: No family available patient lives at home with her husband. Disposition Plan: TBD Consults called: ED physician spoke to Dr. Ardis Hughs with GI. Admission status: Inpatient   Georgette Shell MD [facility]  If 7PM-7AM, please contact  night-coverage [email or web address] Password [facility]  04/20/2017, 4:36 PM

## 2017-04-20 NOTE — ED Triage Notes (Signed)
Patient coming from home. Patient was call back today by pcp who done labs on pt on Friday and her hgb was low. She was advice to come to ed.  Pt have hx of CA.

## 2017-04-20 NOTE — ED Notes (Signed)
Blood transfusion started at 1620 and stay with patient for 15 minutes. No sign or symptoms of transfusion reaction in the fifteen minutes of being with patient. Will continue to monitor patient.

## 2017-04-20 NOTE — ED Provider Notes (Signed)
[facility] DEPT Provider Note   CSN: [id number] Arrival date & time: 04/20/17  1305     History   Chief Complaint No chief complaint on file.   HPI [name] is a 77 y.o. female.  She is here for evaluation of worsening anemia with GI bleeding.  Her PCP has been evaluating her for general fatigue, and dyspnea on exertion.  Labs done 2 days ago showed hemoglobin 6.9 down from 9.2, 1-week prior to that.  She was found to have occult positive stool, 9 days ago, on 3 separate stool collection cards.  She denies chest pain, shortness of breath at rest, fever, chills, abdominal pain, back pain, headache, focal weakness or paresthesia.  She is taking her usual medicines and able to eat without problems.  No chronic history of heartburn, peptic ulcer disease or use of anticoagulants.  There are no other known modifying factors.  HPI  Past Medical History:  Diagnosis Date  . Aortic stenosis, mild   . Aortic valve stenosis, mild   . Arthritis   . Cancer ([place])    vulvar   . Complication of anesthesia   . Diabetes mellitus without complication ([place])    type 2  . Heart murmur   . History of blood transfusion age 85 or 20 months old  . History of radiation therapy 06/05/16 - 07/25/16   Pelvis treated to 45 Gy in 25 fractions, then Boosted an additional 18 Gy in 9 fractions.  . Hypertension   . IFG (impaired fasting glucose)   . PONV (postoperative nausea and vomiting)    years ago with back surgery-nausea  . Vitamin D deficiency     Patient Active Problem List   Diagnosis Date Noted  . Vulvar pain 04/12/2017  . Caruncle of labium 03/17/2017  . Neuropathic pain of right thigh 03/17/2017  . Pulmonary nodules 12/13/2016  . Microcytic anemia 05/30/2016  . Acute blood loss anemia   . Anemia of chronic disease   . GIB (gastrointestinal bleeding) 05/04/2016  . Symptomatic anemia 05/03/2016  . GI bleed 05/03/2016  . Cellulitis 04/24/2016  . Vulva  cancer ([place]) 02/27/2016  . UTI (urinary tract infection) 02/26/2016  . Labial lesion 02/10/2016  . Hypoestrogenism 08/25/2015  . Osteopenia 08/25/2015  . Encounter for long-term opiate analgesic use 07/25/2014  . Osteoarthritis of left hip 10/27/2013  . S/P total hip arthroplasty 10/27/2013  . Degenerative arthritis 12/28/2012  . Aortic valve stenosis, mild 07/30/2012  . Vitamin D deficiency 07/30/2012  . Prediabetes 07/06/2012  . Essential hypertension, benign 07/06/2012  . Hyperlipemia 07/06/2012  . COLONIC POLYPS, HX OF 02/27/2009  . COLITIS 08/23/2008  . CARDIAC MURMUR 08/02/2008  . DIARRHEA 08/02/2008    Past Surgical History:  Procedure Laterality Date  . ABDOMINAL HYSTERECTOMY     complete  . BACK SURGERY     upper  with plate frontal fusion and lower x 2  . COLONOSCOPY    . COLONOSCOPY N/A 05/05/2016   Procedure: COLONOSCOPY;  Surgeon: Daneil Dolin, MD;  [facility];  Service: Endoscopy;  Laterality: N/A;  . ESOPHAGOGASTRODUODENOSCOPY N/A 05/04/2016   Procedure: ESOPHAGOGASTRODUODENOSCOPY (EGD);  Surgeon: Daneil Dolin, MD;  [facility];  Service: Endoscopy;  Laterality: N/A;  . GIVENS CAPSULE STUDY N/A 05/06/2016   Procedure: GIVENS CAPSULE STUDY;  Surgeon: Daneil Dolin, MD;  [facility];  Service: Endoscopy;  Laterality: N/A;  . KNEE SURGERY Left    arthroscopy  .  RADICAL VULVECTOMY Left 04/09/2016   Procedure: RADICAL LEFT VULVECTOMY;  Surgeon: Everitt Amber, MD;  [facility];  Service: Gynecology;  Laterality: Left;  . RADICAL VULVECTOMY  10/17/2016   Procedure: RADICAL ANTERIOR VULVECTOMY;  Surgeon: Everitt Amber, MD;  [facility];  Service: Gynecology;;  . TOE SURGERY Left   . TONSILLECTOMY    . TOTAL HIP ARTHROPLASTY Left 10/27/2013   Procedure: LEFT TOTAL HIP ARTHROPLASTY;  Surgeon: Tobi Bastos, MD;  [facility];  Service: Orthopedics;  Laterality: Left;  . TOTAL KNEE ARTHROPLASTY Right   . VULVA /PERINEUM  BIOPSY  02/22/2016  . VULVECTOMY Right 02/27/2016   Procedure: RADICAL RIGHT VULVECTOMY WITH RIGHT INGUINAL LYMPH NODE DISSECTION;  Surgeon: Nancy Marus, MD;  [facility];  Service: Gynecology;  Laterality: Right;     OB History   None      Home Medications    Prior to Admission medications   Medication Sig Start Date End Date Taking? Authorizing Provider  aspirin 81 MG tablet Take 81 mg by mouth every evening.    Yes [provider]  ferrous sulfate 325 (65 FE) MG tablet Take 1 tablet (325 mg total) by mouth daily with breakfast. 04/11/17  Yes Nilda Simmer, NP  furosemide (LASIX) 40 MG tablet TAKE (1) TABLET BY MOUTH ONCE DAILY. 02/07/17  Yes Mikey Kirschner, MD  gabapentin (NEURONTIN) 300 MG capsule Take 1 capsule (300 mg total) by mouth 3 (three) times daily. 04/11/17  Yes Nilda Simmer, NP  HYDROcodone-acetaminophen (NORCO) 5-325 MG tablet Take 1 tablet by mouth every 6 (six) hours as needed for moderate pain. 04/11/17  Yes Nilda Simmer, NP  losartan (COZAAR) 50 MG tablet TAKE ONE TABLET BY MOUTH ONCE DAILY. 12/23/16  Yes Mikey Kirschner, MD  metFORMIN (GLUCOPHAGE) 500 MG tablet TAKE (1) TABLET BY MOUTH TWICE DAILY. 04/15/17  Yes Mikey Kirschner, MD  potassium chloride SA (K-DUR,KLOR-CON) 20 MEQ tablet TAKE 1 TABLET BY MOUTH ONCE A DAY. 11/29/16  Yes Mikey Kirschner, MD  doxycycline (VIBRA-TABS) 100 MG tablet Take 1 tablet (100 mg total) by mouth 2 (two) times daily. Patient not taking: Reported on 04/20/2017 04/02/17   Nilda Simmer, NP  lidocaine (XYLOCAINE) 4 % external solution Apply topically as needed. Apply to right groin crease as needed for pain. Patient not taking: Reported on 04/20/2017 03/17/17   Everitt Amber, MD    Family History Family History  Problem Relation Age of Onset  . Hypertension Mother   . Hypertension Father   . Throat cancer Father   . Cancer Sister        Breast  . Hypertension Brother     Social History Social  History   Tobacco Use  . Smoking status: Never Smoker  . Smokeless tobacco: Never Used  Substance Use Topics  . Alcohol use: No    Alcohol/week: 0.0 oz  . Drug use: No     Allergies   Oxycodone; Celebrex [celecoxib]; Doxycycline; Iron; Lodine [etodolac]; Nabumetone; Tetracycline; and Xarelto [rivaroxaban]   Review of Systems Review of Systems  All other systems reviewed and are negative.    Physical Exam Updated Vital Signs There were no vitals taken for this visit.  Physical Exam  Constitutional: She is oriented to person, place, and time. She appears well-developed. No distress.  Elderly, frail  HENT:  Head: Normocephalic and atraumatic.  Eyes: Pupils are equal, round, and reactive to light. Conjunctivae and EOM are normal.  Neck:  Normal range of motion and phonation normal. Neck supple.  Cardiovascular: Normal rate and regular rhythm.  Pulmonary/Chest: Effort normal and breath sounds normal. She exhibits no tenderness.  Abdominal: Soft. She exhibits no distension. There is no tenderness. There is no guarding.  Genitourinary:  Genitourinary Comments: Anus with a few external hemorrhoids, which are not thrombosed or bleeding.  Digital rectal examination with small amount of dark colored stool present.  No rectal mass, no rectal pain on examination.  Musculoskeletal: Normal range of motion.  Neurological: She is alert and oriented to person, place, and time. She exhibits normal muscle tone.  Skin: Skin is warm and dry.  Psychiatric: She has a normal mood and affect. Her behavior is normal. Judgment and thought content normal.  Nursing note and vitals reviewed.    ED Treatments / Results  Labs (all labs ordered are listed, but only abnormal results are displayed) Labs Reviewed  COMPREHENSIVE METABOLIC PANEL - Abnormal; Notable for the following components:      Result Value   Glucose, Bld 123 (*)    Calcium 8.8 (*)    Total Protein 6.0 (*)    Albumin 3.2 (*)     AST 14 (*)    ALT 13 (*)    All other components within normal limits  CBC - Abnormal; Notable for the following components:   RBC 1.97 (*)    Hemoglobin 5.5 (*)    HCT 17.8 (*)    All other components within normal limits  POC OCCULT BLOOD, ED - Abnormal; Notable for the following components:   Fecal Occult Bld POSITIVE (*)    All other components within normal limits  TYPE AND SCREEN  PREPARE RBC (CROSSMATCH)    EKG None  Radiology No results found.  Procedures .Critical Care Performed by: Daleen Bo, MD Authorized by: Daleen Bo, MD   Critical care provider statement:    Critical care time (minutes):  35   Critical care start time:  04/20/2017 2:02 PM   Critical care end time:  04/20/2017 3:08 PM   Critical care time was exclusive of:  Separately billable procedures and treating other patients   Critical care was necessary to treat or prevent imminent or life-threatening deterioration of the following conditions: gastrointestinal bleeding.   Critical care was time spent personally by me on the following activities:  Blood draw for specimens, development of treatment plan with patient or surrogate, discussions with consultants, evaluation of patient's response to treatment, examination of patient, obtaining history from patient or surrogate, ordering and performing treatments and interventions, ordering and review of laboratory studies, pulse oximetry, re-evaluation of patient's condition, review of old charts and ordering and review of radiographic studies   (including critical care time)  Medications Ordered in ED Medications  0.9 %  sodium chloride infusion ( Intravenous New Bag/Given 04/20/17 1450)     Initial Impression / Assessment and Plan / ED Course  I have reviewed the triage vital signs and the nursing notes.  Pertinent labs & imaging results that were available during my care of the patient were reviewed by me and considered in my medical decision  making (see chart for details).  Clinical Course as of Apr 21 1507  Sun Apr 20, 2017  1407 Patient was symptomatic anemia, less than 7.0, requiring blood transfusion.  Will arrange for admission after initial stabilization and treatment begun.   [EW]  1459 Discussed with GI, Dr. Oretha Caprice, will see tomorrow and prn.   [EW]  Clinical Course User Index [EW] Daleen Bo, MD    3:00 PM-Consult complete with hospitalist. Patient case explained and discussed.  She agrees to admit patient for further evaluation and treatment. Call ended at 15:08  No data found.  3:07 PM Reevaluation with update and discussion. After initial assessment and treatment, an updated evaluation reveals she remains comfortable, findings and plan discussed with family members and patient, they are agreeable, all questions answered. [place] decision making-weakness with dyspnea on exertion, and anemia.  Likely source is gastrointestinal.  Patient has melanotic stool.  She has had prior upper and lower endoscopies, without abnormal findings by her report.  She requires admission for stabilization, blood transfusion likely multiple units, and GI consultation with intervention, and treatment.  Patient will need close follow-up in the hospital likely stepdown unit.  At the time of ED evaluation she is hemodynamically stable.  Blood transfusion started in the emergency department, because of significantly decreasing hemoglobin, subacutely.  Nursing Notes Reviewed/ Care Coordinated Applicable Imaging Reviewed Interpretation of Laboratory Data incorporated into ED treatment  Plan: Admit   Final Clinical Impressions(s) / ED Diagnoses   Final diagnoses:  None    ED Discharge Orders    None       Daleen Bo, MD 04/20/17 1512

## 2017-04-20 NOTE — ED Notes (Signed)
ED TO INPATIENT HANDOFF REPORT  Name/Age/Gender [name] 77 y.o. female  Code Status    Code Status Orders  (From admission, onward)        Start     Ordered   04/20/17 1648  Full code  Continuous     04/20/17 1649    Code Status History    Date Active Date Inactive Code Status Order ID Comments User Context   10/17/2016 1755 10/18/2016 1502 Full Code [id number]  Lahoma Crocker, MD Inpatient   05/03/2016 1808 05/07/2016 1619 Full Code [id number]  Truett Mainland, DO Inpatient   02/27/2016 1629 02/28/2016 1421 Full Code [id number]  Lahoma Crocker, MD Inpatient   10/27/2013 1632 10/29/2013 1706 Full Code [id number]  Tobi Bastos, MD Inpatient      Home/SNF/Other Home  Chief Complaint abnormal labs  Level of Care/Admitting Diagnosis ED Disposition    ED Disposition Condition [facility] Area: [facility] [[id number]]  Level of Care: Telemetry [5]  Admit to tele based on following criteria: Eval of Syncope  Diagnosis: GI bleed [924268]  Admitting Physician: Georgette Shell [[id number]]  Attending Physician: Georgette Shell [[id number]]  Estimated length of stay: 3 - 4 days  Certification:: I certify this patient will need inpatient services for at least 2 midnights  PT Class (Do Not Modify): Inpatient [101]  PT Acc Code (Do Not Modify): Private [1]       Medical History Past Medical History:  Diagnosis Date  . Aortic stenosis, mild   . Aortic valve stenosis, mild   . Arthritis   . Cancer ([place])    vulvar   . Complication of anesthesia   . Diabetes mellitus without complication ([place])    type 2  . Heart murmur   . History of blood transfusion age 69 or 48 months old  . History of radiation therapy 06/05/16 - 07/25/16   Pelvis treated to 45 Gy in 25 fractions, then Boosted an additional 18 Gy in 9 fractions.  . Hypertension   . IFG (impaired fasting glucose)   . PONV (postoperative nausea and vomiting)    years ago with  back surgery-nausea  . Vitamin D deficiency     Allergies Allergies  Allergen Reactions  . Oxycodone Other (See Comments)    Intense H/A  . Celebrex [Celecoxib] Other (See Comments)    GI upset   . Doxycycline Nausea Only    Gi upset Pt is able to tolerate with food  . Iron Other (See Comments)    Pt cannot tolerate some iron preparations. The upset her stomach.  . Lodine [Etodolac] Nausea Only    Gi upset  . Nabumetone Other (See Comments)    (relafen) unknown  . Tetracycline Nausea And Vomiting  . Xarelto [Rivaroxaban] Hives    IV Location/Drains/Wounds Patient Lines/Drains/Airways Status   Active Line/Drains/Airways    Name:   Placement date:   Placement time:   Site:   Days:   Peripheral IV 04/20/17 Left Forearm   04/20/17    1403    Forearm   less than 1   Peripheral IV 04/20/17 Lateral;Left Forearm   04/20/17    1404    Forearm   less than 1   Urethral Catheter Dr. Denman George Latex 16 Fr.   10/17/16    1250    Latex   185   Incision (Closed) 02/27/16 Groin   02/27/16    1357     418  Incision (Closed) 02/27/16 Perineum   02/27/16    1441     418   Incision (Closed) 04/09/16 Perineum   04/09/16    1707     376   Incision (Closed) 10/17/16 Perineum   10/17/16    1339     185          Labs/Imaging Results for orders placed or performed during the hospital encounter of 04/20/17 (from the past 48 hour(s))  Comprehensive metabolic panel     Status: Abnormal   Collection Time: 04/20/17  2:10 PM  Result Value Ref Range   Sodium 142 135 - 145 mmol/L   Potassium 3.8 3.5 - 5.1 mmol/L   Chloride 107 101 - 111 mmol/L   CO2 27 22 - 32 mmol/L   Glucose, Bld 123 (H) 65 - 99 mg/dL   BUN 18 6 - 20 mg/dL   Creatinine, Ser 0.72 0.44 - 1.00 mg/dL   Calcium 8.8 (L) 8.9 - 10.3 mg/dL   Total Protein 6.0 (L) 6.5 - 8.1 g/dL   Albumin 3.2 (L) 3.5 - 5.0 g/dL   AST 14 (L) 15 - 41 U/L   ALT 13 (L) 14 - 54 U/L   Alkaline Phosphatase 58 38 - 126 U/L   Total Bilirubin 0.3 0.3 - 1.2 mg/dL    GFR calc non Af Amer >60 >60 mL/min   GFR calc Af Amer >60 >60 mL/min    Comment: (NOTE) The eGFR has been calculated using the CKD EPI equation. This calculation has not been validated in all clinical situations. eGFR's persistently <60 mL/min signify possible Chronic Kidney Disease.    Anion gap 8 5 - 15    Comment: Performed at [facility], [place] [address] [place], [place] [address]  CBC     Status: Abnormal   Collection Time: 04/20/17  2:10 PM  Result Value Ref Range   WBC 5.3 4.0 - 10.5 K/uL   RBC 1.97 (L) 3.87 - 5.11 MIL/uL   Hemoglobin 5.5 (LL) 12.0 - 15.0 g/dL    Comment: CRITICAL RESULT CALLED TO, READ BACK BY AND VERIFIED WITH: S.CLAPP RN 04/20/2017 1448 JR    HCT 17.8 (L) 36.0 - 46.0 %   MCV 90.4 78.0 - 100.0 fL   MCH 27.9 26.0 - 34.0 pg   MCHC 30.9 30.0 - 36.0 g/dL   RDW 15.2 11.5 - 15.5 %   Platelets 199 150 - 400 K/uL    Comment: Performed at [facility], [place] [address], [place], [place] [address]  Type and screen [place]     Status: None (Preliminary result)   Collection Time: 04/20/17  2:10 PM  Result Value Ref Range   ABO/RH(D) B POS    Antibody Screen NEG    Sample Expiration 04/23/2017    Unit Number [id number]    Blood Component Type RBC LR PHER1    Unit division 00    Status of Unit ISSUED    Transfusion Status OK TO TRANSFUSE    Crossmatch Result      Compatible Performed at [facility], [place] [address] [place], [place] [address]    Unit Number [id number]    Blood Component Type RED CELLS,LR    Unit division 00    Status of Unit ALLOCATED    Transfusion Status OK TO TRANSFUSE    Crossmatch Result Compatible   Prepare RBC     Status: None   Collection Time: 04/20/17  2:10 PM  Result Value Ref Range   Order Confirmation      ORDER PROCESSED BY BLOOD BANK Performed at [place] [address], [place], [place] [address]   POC  occult blood, ED Provider will collect     Status: Abnormal   Collection Time: 04/20/17  2:27 PM  Result Value Ref Range   Fecal Occult Bld POSITIVE (A) NEGATIVE   No results found.  Pending Labs Unresulted Labs (From admission, onward)   Start     Ordered   04/21/17 0500  Comprehensive metabolic panel  Tomorrow morning,   R     04/20/17 1649   04/21/17 0500  CBC  Tomorrow morning,   R     04/20/17 1649   04/21/17 0500  Protime-INR  Tomorrow morning,   R     04/20/17 1649   04/21/17 0500  APTT  Tomorrow morning,   R     04/20/17 1649      Vitals/Pain Today's Vitals   04/20/17 1645 04/20/17 1700 04/20/17 1715 04/20/17 1722  BP:  (!) 111/57  (!) 111/57  Pulse: 92 87 92 87  Resp: '18 18 18 18  '$ Temp:      TempSrc:      SpO2: 98% 97% 98% 97%  PainSc:        Isolation Precautions No active isolations  Medications Medications  0.9 %  sodium chloride infusion (has no administration in time range)  pantoprazole (PROTONIX) injection 40 mg (has no administration in time range)  0.9 %  sodium chloride infusion ( Intravenous New Bag/Given 04/20/17 1450)    Mobility walks

## 2017-04-21 ENCOUNTER — Inpatient Hospital Stay (HOSPITAL_COMMUNITY): Payer: PPO | Admitting: Certified Registered Nurse Anesthetist

## 2017-04-21 ENCOUNTER — Encounter (HOSPITAL_COMMUNITY): Payer: Self-pay | Admitting: *Deleted

## 2017-04-21 ENCOUNTER — Encounter (HOSPITAL_COMMUNITY)
Admission: EM | Disposition: A | Discharge status: Home / Self Care | Payer: Self-pay | Source: Home / Self Care | Attending: Internal Medicine

## 2017-04-21 DIAGNOSIS — K922 Gastrointestinal hemorrhage, unspecified: Secondary | ICD-10-CM

## 2017-04-21 DIAGNOSIS — K921 Melena: Principal | ICD-10-CM

## 2017-04-21 DIAGNOSIS — D509 Iron deficiency anemia, unspecified: Secondary | ICD-10-CM

## 2017-04-21 DIAGNOSIS — R195 Other fecal abnormalities: Secondary | ICD-10-CM

## 2017-04-21 HISTORY — PX: ESOPHAGOGASTRODUODENOSCOPY: SHX5428

## 2017-04-21 LAB — APTT: aPTT: 32 seconds (ref 24–36)

## 2017-04-21 LAB — CBC
HCT: 23.7 % — ABNORMAL LOW (ref 36.0–46.0)
HEMOGLOBIN: 7.7 g/dL — AB (ref 12.0–15.0)
MCH: 29.2 pg (ref 26.0–34.0)
MCHC: 32.5 g/dL (ref 30.0–36.0)
MCV: 89.8 fL (ref 78.0–100.0)
PLATELETS: 177 10*3/uL (ref 150–400)
RBC: 2.64 MIL/uL — AB (ref 3.87–5.11)
RDW: 14.9 % (ref 11.5–15.5)
WBC: 4.2 10*3/uL (ref 4.0–10.5)

## 2017-04-21 LAB — PROTIME-INR
INR: 1.15
Prothrombin Time: 14.6 seconds (ref 11.4–15.2)

## 2017-04-21 LAB — COMPREHENSIVE METABOLIC PANEL
ALK PHOS: 48 U/L (ref 38–126)
ALT: 11 U/L — AB (ref 14–54)
ANION GAP: 7 (ref 5–15)
AST: 13 U/L — ABNORMAL LOW (ref 15–41)
Albumin: 2.9 g/dL — ABNORMAL LOW (ref 3.5–5.0)
BUN: 13 mg/dL (ref 6–20)
CALCIUM: 8.5 mg/dL — AB (ref 8.9–10.3)
CO2: 27 mmol/L (ref 22–32)
CREATININE: 0.7 mg/dL (ref 0.44–1.00)
Chloride: 108 mmol/L (ref 101–111)
GFR calc Af Amer: >60 mL/min (ref >60)
Glucose, Bld: 106 mg/dL — ABNORMAL HIGH (ref 65–99)
Potassium: 3.8 mmol/L (ref 3.5–5.1)
Sodium: 142 mmol/L (ref 135–145)
Total Bilirubin: 0.9 mg/dL (ref 0.3–1.2)
Total Protein: 5.5 g/dL — ABNORMAL LOW (ref 6.5–8.1)

## 2017-04-21 LAB — TYPE AND SCREEN
ABO/RH(D): B POS
Antibody Screen: NEGATIVE
UNIT DIVISION: 0
Unit division: 0

## 2017-04-21 LAB — BPAM RBC
BLOOD PRODUCT EXPIRATION DATE: 201904242359
Blood Product Expiration Date: 201904202359
ISSUE DATE / TIME: 201903311848
ISSUE DATE / TIME: 201903311539
UNIT TYPE AND RH: 7300
Unit Type and Rh: 7300

## 2017-04-21 LAB — GLUCOSE, CAPILLARY
GLUCOSE-CAPILLARY: 101 mg/dL — AB (ref 65–99)
GLUCOSE-CAPILLARY: 120 mg/dL — AB (ref 65–99)
Glucose-Capillary: 111 mg/dL — ABNORMAL HIGH (ref 65–99)

## 2017-04-21 SURGERY — EGD (ESOPHAGOGASTRODUODENOSCOPY)
Anesthesia: Monitor Anesthesia Care

## 2017-04-21 MED ORDER — INSULIN ASPART 100 UNIT/ML ~~LOC~~ SOLN: 0.0000 [IU] | SUBCUTANEOUS | Status: DC

## 2017-04-21 MED ORDER — PROPOFOL 500 MG/50ML IV EMUL
INTRAVENOUS | Status: DC | PRN
  Administered 2017-04-21: 100 ug/kg/min via INTRAVENOUS

## 2017-04-21 MED ORDER — LIDOCAINE 2% (20 MG/ML) 5 ML SYRINGE
INTRAMUSCULAR | Status: DC | PRN
  Administered 2017-04-21: 50 mg via INTRAVENOUS

## 2017-04-21 MED ORDER — ONDANSETRON HCL 4 MG/2ML IJ SOLN
INTRAMUSCULAR | Status: DC | PRN
  Administered 2017-04-21: 4 mg via INTRAVENOUS

## 2017-04-21 MED ORDER — PROPOFOL 10 MG/ML IV BOLUS
INTRAVENOUS | Status: DC | PRN
  Administered 2017-04-21: 20 mg via INTRAVENOUS

## 2017-04-21 MED ORDER — LACTATED RINGERS IV SOLN
INTRAVENOUS | Status: DC
  Administered 2017-04-21: 1000 mL via INTRAVENOUS

## 2017-04-21 MED ORDER — PROPOFOL 10 MG/ML IV BOLUS
INTRAVENOUS | Status: AC
  Filled 2017-04-21: qty 40

## 2017-04-21 NOTE — Op Note (Signed)
[facility] Patient Name: [name] Procedure Date: 04/21/2017 MRN: [id number] Attending MD: Ladene Artist , MD Date of Birth: 01/09/1941 CSN: [id number] Age: 77 Admit Type: Inpatient Procedure:                Upper GI endoscopy Indications:              Iron deficiency anemia, Melena Providers:                Pricilla Riffle. Fuller Plan, MD, William Dalton, Technician,                            Cleda Daub, RN Referring MD:             [facility] Medicines:                Monitored Anesthesia Care Complications:            No immediate complications. Estimated Blood Loss:     Estimated blood loss: none. Procedure:                Pre-Anesthesia Assessment:                           - Prior to the procedure, a History and Physical                            was performed, and patient medications and                            allergies were reviewed. The patient's tolerance of                            previous anesthesia was also reviewed. The risks                            and benefits of the procedure and the sedation                            options and risks were discussed with the patient.                            All questions were answered, and informed consent                            was obtained. Prior Anticoagulants: The patient has                            taken no previous anticoagulant or antiplatelet                            agents. ASA Grade Assessment: III - A patient with                            severe systemic disease. After reviewing the risks  and benefits, the patient was deemed in                            satisfactory condition to undergo the procedure.                           After obtaining informed consent, the endoscope was                            passed under direct vision. Throughout the                            procedure, the patient's blood pressure, pulse, and          oxygen saturations were monitored continuously. The                            EG-[id number] ([id number]) scope was introduced through the                            mouth, and advanced to the third part of duodenum.                            The upper GI endoscopy was accomplished without                            difficulty. The patient tolerated the procedure                            well. Scope In: Scope Out: Findings:      The esophagus was normal.      The stomach was normal.      The examined duodenum was normal.      The cardia and gastric fundus were normal on retroflexion. Impression:               - Normal esophagus.                           - Normal stomach.                           - Normal examined duodenum.                           - No specimens collected. Moderate Sedation:      N/A- Per Anesthesia Care Recommendation:           - Patient has a contact number available for                            emergencies. The signs and symptoms of potential                            delayed complications were discussed with the                            patient. Return  to normal activities tomorrow.                            Written discharge instructions were provided to the                            patient.                           - Resume previous diet.                           - Continue present medications.                           - Return patient to hospital ward for ongoing care.                           - GI follow with Dr. Gala Romney or Dr. Lyndel Safe. Procedure Code(s):        --- Professional ---                           [phone], Esophagogastroduodenoscopy, flexible,                            transoral; diagnostic, including collection of                            specimen(s) by brushing or washing, when performed                            (separate procedure) Diagnosis Code(s):        --- Professional ---                           D50.9, Iron deficiency  anemia, unspecified                           K92.1, Melena (includes Hematochezia) CPT copyright 2016 American Medical Association. All rights reserved. The codes documented in this report are preliminary and upon coder review may  be revised to meet current compliance requirements. Ladene Artist, MD 04/21/2017 1:02:31 PM This report has been signed electronically. Number of Addenda: 0

## 2017-04-21 NOTE — Interval H&P Note (Signed)
History and Physical Interval Note:  04/21/2017 12:38 PM  [name]  has presented today for surgery, with the diagnosis of melena  The various methods of treatment have been discussed with the patient and family. After consideration of risks, benefits and other options for treatment, the patient has consented to  Procedure(s): ESOPHAGOGASTRODUODENOSCOPY (EGD) (N/A) as a surgical intervention .  The patient's history has been reviewed, patient examined, no change in status, stable for surgery.  I have reviewed the patient's chart and labs.  Questions were answered to the patient's satisfaction.     Pricilla Riffle. Fuller Plan

## 2017-04-21 NOTE — Transfer of Care (Signed)
Immediate Anesthesia Transfer of Care Note  Patient: [name]  Procedure(s) Performed: ESOPHAGOGASTRODUODENOSCOPY (EGD) (N/A )  Patient Location: PACU  Anesthesia Type:MAC  Level of Consciousness: awake, alert  and oriented  Airway & Oxygen Therapy: Patient Spontanous Breathing and Patient connected to nasal cannula oxygen  Post-op Assessment: Report given to RN and Post -op Vital signs reviewed and stable  Post vital signs: Reviewed and stable  Last Vitals:  Vitals Value Taken Time  BP    Temp    Pulse 91 04/21/2017  1:06 PM  Resp 18 04/21/2017  1:06 PM  SpO2 100 % 04/21/2017  1:06 PM  Vitals shown include unvalidated device data.  Last Pain:  Vitals:   04/21/17 1306  TempSrc:   PainSc: 0-No pain         Complications: No apparent anesthesia complications

## 2017-04-21 NOTE — Anesthesia Postprocedure Evaluation (Signed)
Anesthesia Post Note  Patient: [name]  Procedure(s) Performed: ESOPHAGOGASTRODUODENOSCOPY (EGD) (N/A )     Patient location during evaluation: Endoscopy Anesthesia Type: MAC Level of consciousness: awake and alert Pain management: pain level controlled Vital Signs Assessment: post-procedure vital signs reviewed and stable Respiratory status: spontaneous breathing, nonlabored ventilation, respiratory function stable and patient connected to nasal cannula oxygen Cardiovascular status: blood pressure returned to baseline and stable Postop Assessment: no apparent nausea or vomiting Anesthetic complications: no    Last Vitals:  Vitals:   04/21/17 1330 04/21/17 1535  BP: (!) 123/55 111/60  Pulse: 84 82  Resp: (!) 22 20  Temp:  37 C  SpO2: 97% 97%    Last Pain:  Vitals:   04/21/17 1330  TempSrc:   PainSc: 0-No pain                 Sherril Heyward S

## 2017-04-21 NOTE — Anesthesia Preprocedure Evaluation (Signed)
Anesthesia Evaluation  Patient identified by MRN, date of birth, ID band Patient awake    Reviewed: Allergy & Precautions, H&P , NPO status , Patient's Chart, lab work & pertinent test results  History of Anesthesia Complications (+) PONV and history of anesthetic complications  Airway Mallampati: II   Neck ROM: full    Dental   Pulmonary neg pulmonary ROS,    breath sounds clear to auscultation       Cardiovascular hypertension, + Valvular Problems/Murmurs AS  Rhythm:regular Rate:Normal  Mild AS   Neuro/Psych    GI/Hepatic   Endo/Other  diabetes, Type 2  Renal/GU      Musculoskeletal  (+) Arthritis ,   Abdominal   Peds  Hematology  (+) Blood dyscrasia, anemia ,   Anesthesia Other Findings   Reproductive/Obstetrics                             Anesthesia Physical Anesthesia Plan  ASA: III  Anesthesia Plan: MAC   Post-op Pain Management:    Induction: Intravenous  PONV Risk Score and Plan: 3 and Propofol infusion and Treatment may vary due to age or medical condition  Airway Management Planned: Nasal Cannula  Additional Equipment:   Intra-op Plan:   Post-operative Plan:   Informed Consent: I have reviewed the patients History and Physical, chart, labs and discussed the procedure including the risks, benefits and alternatives for the proposed anesthesia with the patient or authorized representative who has indicated his/her understanding and acceptance.     Plan Discussed with: CRNA, Anesthesiologist and Surgeon  Anesthesia Plan Comments:         Anesthesia Quick Evaluation

## 2017-04-21 NOTE — H&P (View-Only) (Signed)
Referring Provider: [facility]  Primary Care Physician:  Mikey Kirschner, MD Primary Gastroenterologist: Dr. Gala Romney  Reason for Consultation:   GI bleed    ASSESSMENT AND PLAN:    66. 77 yo female with recurrent GI bleed with dark, Heme + stool x 3 weeks and profound anemia. Same presentation in April 2018 with with negative evaluation by Dr. Gala Romney. She is hemodynamically stable. This appears to be a slow upper GI bleed, BUN is normal. Suspect intestinal AVMs.  -Will start with EGD today. The risks and benefits of EGD were discussed and the patient agrees to proceed. She has been NPO.  -continue Protonix.   2. Severe normocytic anemia. Hgb down from 12.1 in November to 9.2 last week and 5.5 yesterday.  -she has received 2 units of blood. Hgb up appropriately rose to 2 grams to 7.7 today.     Attending physician's note   I have taken history, reviewed the chart and examined the patient. I agree with the Advanced Practitioner's note, impression and recommendations.  77 year old white female with obscure/occult GI bleeding.  She had negative GI workup by Dr. Buford Dresser including EGD, colonoscopy and capsule endoscopy in April 2018.  She had negative CT scan of the abdomen and pelvis in February 2018.  Plan is to proceed with EGD today.  Further recommendations will be thereafter.  She is currently on baby aspirin.   Carmell Austria, MD [phone], Mon-Fri 8a-5p [phone] after 5p, weekends, holidays   HPI: [name] is a 77 y.o. female with a history of stage IIIa vulvar cancer, DM2, and mild aortic stenosis.  She started having black stool approximately 3 weeks ago. Saw PCP recently, told hgb was low and started on iron with plans to recheck hgb on Friday. In the interim she has been admitted with dizziness and SOB yesterday. Hgb 9.2 on 3/22, down to 5.5 yesterday . She has gotten 2 units of blood and feels much better with hgb up to 7.7 today. Patient takes a daily baby asa, no other  NSAIDS. No abdominal pain, N/V or other GI symptoms.   Patient had same presentation with anemia and H+ stool in April 2018. EGD / colonoscopy by Dr. Gala Romney were unrevealing. Had a small bowel and per office note it showed old blood toward distal small bowel.     Past Medical History:  Diagnosis Date  . Aortic stenosis, mild   . Aortic valve stenosis, mild   . Arthritis   . Cancer ([place])    vulvar   . Complication of anesthesia   . Diabetes mellitus without complication ([place])    type 2  . Heart murmur   . History of blood transfusion age 42 or 18 months old  . History of radiation therapy 06/05/16 - 07/25/16   Pelvis treated to 45 Gy in 25 fractions, then Boosted an additional 18 Gy in 9 fractions.  . Hypertension   . IFG (impaired fasting glucose)   . PONV (postoperative nausea and vomiting)    years ago with back surgery-nausea  . Vitamin D deficiency     Past Surgical History:  Procedure Laterality Date  . ABDOMINAL HYSTERECTOMY     complete  . BACK SURGERY     upper  with plate frontal fusion and lower x 2  . COLONOSCOPY    . COLONOSCOPY N/A 05/05/2016   Procedure: COLONOSCOPY;  Surgeon: Daneil Dolin, MD;  [facility]: [facility];  Service: Endoscopy;  Laterality: N/A;  .  ESOPHAGOGASTRODUODENOSCOPY N/A 05/04/2016   Procedure: ESOPHAGOGASTRODUODENOSCOPY (EGD);  Surgeon: Daneil Dolin, MD;  Location: [facility];  Service: Endoscopy;  Laterality: N/A;  . GIVENS CAPSULE STUDY N/A 05/06/2016   Procedure: GIVENS CAPSULE STUDY;  Surgeon: Daneil Dolin, MD;  [facility]: [facility];  Service: Endoscopy;  Laterality: N/A;  . KNEE SURGERY Left    arthroscopy  . RADICAL VULVECTOMY Left 04/09/2016   Procedure: RADICAL LEFT VULVECTOMY;  Surgeon: Everitt Amber, MD;  Location: [facility];  Service: Gynecology;  Laterality: Left;  . RADICAL VULVECTOMY  10/17/2016   Procedure: RADICAL ANTERIOR VULVECTOMY;  Surgeon: Everitt Amber, MD;  Location: [facility];  Service: Gynecology;;  . TOE SURGERY Left     . TONSILLECTOMY    . TOTAL HIP ARTHROPLASTY Left 10/27/2013   Procedure: LEFT TOTAL HIP ARTHROPLASTY;  Surgeon: Tobi Bastos, MD;  Location: [facility];  Service: Orthopedics;  Laterality: Left;  . TOTAL KNEE ARTHROPLASTY Right   . VULVA /PERINEUM BIOPSY  02/22/2016  . VULVECTOMY Right 02/27/2016   Procedure: RADICAL RIGHT VULVECTOMY WITH RIGHT INGUINAL LYMPH NODE DISSECTION;  Surgeon: Nancy Marus, MD;  Location: [facility];  Service: Gynecology;  Laterality: Right;    Prior to Admission medications   Medication Sig Start Date End Date Taking? Authorizing Provider  aspirin 81 MG tablet Take 81 mg by mouth every evening.    Yes [provider]  ferrous sulfate 325 (65 FE) MG tablet Take 1 tablet (325 mg total) by mouth daily with breakfast. 04/11/17  Yes Nilda Simmer, NP  furosemide (LASIX) 40 MG tablet TAKE (1) TABLET BY MOUTH ONCE DAILY. 02/07/17  Yes Mikey Kirschner, MD  gabapentin (NEURONTIN) 300 MG capsule Take 1 capsule (300 mg total) by mouth 3 (three) times daily. 04/11/17  Yes Nilda Simmer, NP  HYDROcodone-acetaminophen (NORCO) 5-325 MG tablet Take 1 tablet by mouth every 6 (six) hours as needed for moderate pain. 04/11/17  Yes Nilda Simmer, NP  losartan (COZAAR) 50 MG tablet TAKE ONE TABLET BY MOUTH ONCE DAILY. 12/23/16  Yes Mikey Kirschner, MD  metFORMIN (GLUCOPHAGE) 500 MG tablet TAKE (1) TABLET BY MOUTH TWICE DAILY. 04/15/17  Yes Mikey Kirschner, MD  potassium chloride SA (K-DUR,KLOR-CON) 20 MEQ tablet TAKE 1 TABLET BY MOUTH ONCE A DAY. 11/29/16  Yes Mikey Kirschner, MD  doxycycline (VIBRA-TABS) 100 MG tablet Take 1 tablet (100 mg total) by mouth 2 (two) times daily. Patient not taking: Reported on 04/20/2017 04/02/17   Nilda Simmer, NP  lidocaine (XYLOCAINE) 4 % external solution Apply topically as needed. Apply to right groin crease as needed for pain. Patient not taking: Reported on 04/20/2017 03/17/17   Everitt Amber, MD    Current  Facility-Administered Medications  Medication Dose Route Frequency Provider Last Rate Last Dose  . 0.9 %  sodium chloride infusion   Intravenous Continuous Georgette Shell, MD 75 mL/hr at 04/20/17 2044    . insulin aspart (novoLOG) injection 0-15 Units  0-15 Units Subcutaneous Q4H Donne Hazel, MD      . pantoprazole (PROTONIX) injection 40 mg  40 mg Intravenous Q12H Georgette Shell, MD   40 mg at 04/20/17 2039    Allergies as of 04/20/2017 - Review Complete 04/20/2017  Allergen Reaction Noted  . Oxycodone Other (See Comments) 10/24/2016  . Celebrex [celecoxib] Other (See Comments) 10/26/2014  . Doxycycline Nausea Only 05/21/2012  . Iron Other (See Comments) 05/29/2016  . Lodine [etodolac] Nausea Only 05/21/2012  .  Nabumetone Other (See Comments)   . Tetracycline Nausea And Vomiting   . Xarelto [rivaroxaban] Hives 10/26/2014    Family History  Problem Relation Age of Onset  . Hypertension Mother   . Hypertension Father   . Throat cancer Father   . Cancer Sister        Breast  . Hypertension Brother     Social History   Socioeconomic History  . Marital status: Married    Spouse name: Not on file  . Number of children: 2  . Years of education: Not on file  . Highest education level: Not on file  Occupational History  . Not on file  Social Needs  . Financial resource strain: Not on file  . Food insecurity:    Worry: Not on file    Inability: Not on file  . Transportation needs:    Medical: Not on file    Non-medical: Not on file  Tobacco Use  . Smoking status: Never Smoker  . Smokeless tobacco: Never Used  Substance and Sexual Activity  . Alcohol use: No    Alcohol/week: 0.0 oz  . Drug use: No  . Sexual activity: Not on file  Lifestyle  . Physical activity:    Days per week: Not on file    Minutes per session: Not on file  . Stress: Not on file  Relationships  . Social connections:    Talks on phone: Not on file    Gets together: Not on file     Attends religious service: Not on file    Active member of club or organization: Not on file    Attends meetings of clubs or organizations: Not on file    Relationship status: Not on file  . Intimate partner violence:    Fear of current or ex partner: Not on file    Emotionally abused: Not on file    Physically abused: Not on file    Forced sexual activity: Not on file  Other Topics Concern  . Not on file  Social History Narrative  . Not on file    Review of Systems: All systems reviewed and negative except where noted in HPI.  Physical Exam: Vital signs in last 24 hours: Temp:  [98.1 F (36.7 C)-99 F (37.2 C)] 98.1 F (36.7 C) (04/01 0540) Pulse Rate:  [84-101] 85 (04/01 0540) Resp:  [10-21] 18 (04/01 0540) BP: (99-135)/(48-70) 124/70 (04/01 0540) SpO2:  [94 %-100 %] 98 % (04/01 0540) Weight:  [197 lb 8.5 oz (89.6 kg)] 197 lb 8.5 oz (89.6 kg) (03/31 1818)   General:   Alert, pale white female in NAD Psych:  Pleasant, cooperative. Normal mood and affect. Eyes:  Pupils equal, sclera clear, no icterus.   Conjunctiva pink. Ears:  Normal auditory acuity. Nose:  No deformity, discharge,  or lesions. Neck:  Supple; no masses Lungs:  Clear throughout to auscultation.   No wheezes, crackles, or rhonchi.  Heart:  Regular rate and rhythm; soft murmur, no edema Abdomen:  Soft, non-distended, nontender, BS active, no palp mass    Rectal:  Deferred  Msk:  Symmetrical without gross deformities. . Neurologic:  Alert and  oriented x4;  grossly normal neurologically. Skin:  Intact without significant lesions or rashes..   Intake/Output from previous day: 03/31 0701 - 04/01 0700 In: 7106 [P.O.:120; I.V.:695; Blood:636] Out: -  Intake/Output this shift: No intake/output data recorded.  Lab Results: Recent Labs    04/18/17 0943 04/20/17 1410 04/21/17 0435  WBC  5.9 5.3 4.2  HGB 6.9* 5.5* 7.7*  HCT 21.3* 17.8* 23.7*  PLT 277 199 177   BMET Recent Labs    04/20/17 1410  04/21/17 0435  NA 142 142  K 3.8 3.8  CL 107 108  CO2 27 27  GLUCOSE 123* 106*  BUN 18 13  CREATININE 0.72 0.70  CALCIUM 8.8* 8.5*   LFT Recent Labs    04/21/17 0435  PROT 5.5*  ALBUMIN 2.9*  AST 13*  ALT 11*  ALKPHOS 48  BILITOT 0.9   PT/INR Recent Labs    04/21/17 0435  LABPROT 14.6  INR 1.15     Tye Savoy, NP-C @  04/21/2017, 8:52 AM  Pager number [phone]

## 2017-04-21 NOTE — Progress Notes (Signed)
PROGRESS NOTE    JOSETTA Campbell  PJA:[id number] DOB: Sep 24, 1940 DOA: 04/20/2017 PCP: Mikey Kirschner, MD    Brief Narrative:  77 y.o. female with medical history significant of vulvar cancer, diabetes type 2, history of chronic heart murmur, history of radiation therapy to the pelvis, aortic stenosis mild, admitted with complaints of generalized weakness and shortness of breath.  A week ago her hemoglobin was 9 and today is come down to 5.5.  Patient reports that her stool has been black for over 3 weeks.  She was found to be FOBT positive prior to admission.  On 3 separate stool collection cards.  She denies chest pain.  However she does feel short of breath.  No fever chills nausea vomiting diarrhea abdominal pain back pain headache changes with her vision.  No hematemesis.  Reports melena.  History of peptic ulcer disease.  She takes aspirin daily 81 mg. Patient found to have hgb 5.5, given 2 units PRBC's. Patient admitted for further work up.  Assessment & Plan:   Active Problems:   GI bleed   Iron deficiency anemia   Melena    1] Upper GI bleed  -patient presented with complaints of generalized weakness and dyspnea on exertion with drop in hemoglobin to 5.5 today with melena.   -Hgb appropriately corrected with 2 units of blood -Patient had been continued on PPI -Underwent EGD on 4/1 with normal findings  2] type 2 diabetes -metformin on hold -have ordered SSI coverage  3] history of vulvar cancer.   -Status post adjuvant vulvar and inguinal radiatio with radical right vulvectomy and inguinal lymphadenectomy. -Patient followed by gyn-onc  DVT prophylaxis: SCD's Code Status: Full Family Communication: Pt in room, family not at bedside Disposition Plan: Possible d/c home in the next 24hrs  Consultants:   GI  Procedures:   EGD 4/1  Antimicrobials: Anti-infectives (From admission, onward)   None       Subjective: Without complaints. Without abd pain or  nausea  Objective: Vitals:   04/21/17 1310 04/21/17 1320 04/21/17 1330 04/21/17 1535  BP: (!) 118/52 123/69 (!) 123/55 111/60  Pulse: 87 85 84 82  Resp: (!) 21 17 (!) 22 20  Temp:    98.6 F (37 C)  TempSrc:      SpO2: 100% 100% 97% 97%  Weight:      Height:        Intake/Output Summary (Last 24 hours) at 04/21/2017 1540 Last data filed at 04/21/2017 1400 Gross per 24 hour  Intake 2613.04 ml  Output -  Net 2613.04 ml   Filed Weights   04/20/17 1818 04/21/17 1203  Weight: 89.6 kg (197 lb 8.5 oz) 89.8 kg (198 lb)    Examination:  General exam: Appears calm and comfortable  Respiratory system: Clear to auscultation. Respiratory effort normal. Cardiovascular system: S1 & S2 heard, regular Gastrointestinal system: Abdomen is nondistended, soft and nontender. No organomegaly or masses felt. Normal bowel sounds heard. Central nervous system: Alert and oriented. No focal neurological deficits. Extremities: Symmetric 5 x 5 power. Skin: No rashes, lesions  Psychiatry: Judgement and insight appear normal. Mood & affect appropriate.   Data Reviewed: I have personally reviewed following labs and imaging studies  CBC: Recent Labs  Lab 04/18/17 0943 04/20/17 1410 04/21/17 0435  WBC 5.9 5.3 4.2  NEUTROABS 4.5  --   --   HGB 6.9* 5.5* 7.7*  HCT 21.3* 17.8* 23.7*  MCV 87 90.4 89.8  PLT 277 199 177  Basic Metabolic Panel: Recent Labs  Lab 04/20/17 1410 04/21/17 0435  NA 142 142  K 3.8 3.8  CL 107 108  CO2 27 27  GLUCOSE 123* 106*  BUN 18 13  CREATININE 0.72 0.70  CALCIUM 8.8* 8.5*   GFR: Estimated Creatinine Clearance: 61.4 mL/min (by C-G formula based on SCr of 0.7 mg/dL). Liver Function Tests: Recent Labs  Lab 04/20/17 1410 04/21/17 0435  AST 14* 13*  ALT 13* 11*  ALKPHOS 58 48  BILITOT 0.3 0.9  PROT 6.0* 5.5*  ALBUMIN 3.2* 2.9*   No results for input(s): LIPASE, AMYLASE in the last 168 hours. No results for input(s): AMMONIA in the last 168  hours. Coagulation Profile: Recent Labs  Lab 04/21/17 0435  INR 1.15   Cardiac Enzymes: No results for input(s): CKTOTAL, CKMB, CKMBINDEX, TROPONINI in the last 168 hours. BNP (last 3 results) No results for input(s): PROBNP in the last 8760 hours. HbA1C: No results for input(s): HGBA1C in the last 72 hours. CBG: Recent Labs  Lab 04/21/17 1057  GLUCAP 101*   Lipid Profile: No results for input(s): CHOL, HDL, LDLCALC, TRIG, CHOLHDL, LDLDIRECT in the last 72 hours. Thyroid Function Tests: No results for input(s): TSH, T4TOTAL, FREET4, T3FREE, THYROIDAB in the last 72 hours. Anemia Panel: No results for input(s): VITAMINB12, FOLATE, FERRITIN, TIBC, IRON, RETICCTPCT in the last 72 hours. Sepsis Labs: No results for input(s): PROCALCITON, LATICACIDVEN in the last 168 hours.  No results found for this or any previous visit (from the past 240 hour(s)).   Radiology Studies: No results found.  Scheduled Meds: . insulin aspart  0-15 Units Subcutaneous Q4H   Continuous Infusions: . sodium chloride 75 mL/hr at 04/20/17 2044     LOS: 1 day   Marylu Lund, MD [facility] Pager [phone]  If 7PM-7AM, please contact night-coverage [email or web address] Password [email or web address] 04/21/2017, 3:40 PM

## 2017-04-21 NOTE — Consult Note (Addendum)
Referring Provider: [facility]  Primary Care Physician:  Mikey Kirschner, MD Primary Gastroenterologist: Dr. Gala Romney  Reason for Consultation:   GI bleed    ASSESSMENT AND PLAN:    85. 77 yo female with recurrent GI bleed with dark, Heme + stool x 3 weeks and profound anemia. Same presentation in April 2018 with with negative evaluation by Dr. Gala Romney. She is hemodynamically stable. This appears to be a slow upper GI bleed, BUN is normal. Suspect intestinal AVMs.  -Will start with EGD today. The risks and benefits of EGD were discussed and the patient agrees to proceed. She has been NPO.  -continue Protonix.   2. Severe normocytic anemia. Hgb down from 12.1 in November to 9.2 last week and 5.5 yesterday.  -she has received 2 units of blood. Hgb up appropriately rose to 2 grams to 7.7 today.     Attending physician's note   I have taken history, reviewed the chart and examined the patient. I agree with the Advanced Practitioner's note, impression and recommendations.  77 year old white female with obscure/occult GI bleeding.  She had negative GI workup by Dr. Buford Dresser including EGD, colonoscopy and capsule endoscopy in April 2018.  She had negative CT scan of the abdomen and pelvis in February 2018.  Plan is to proceed with EGD today.  Further recommendations will be thereafter.  She is currently on baby aspirin.   Carmell Austria, MD [phone], Mon-Fri 8a-5p [phone] after 5p, weekends, holidays   HPI: [name] is a 77 y.o. female with a history of stage IIIa vulvar cancer, DM2, and mild aortic stenosis.  She started having black stool approximately 3 weeks ago. Saw PCP recently, told hgb was low and started on iron with plans to recheck hgb on Friday. In the interim she has been admitted with dizziness and SOB yesterday. Hgb 9.2 on 3/22, down to 5.5 yesterday . She has gotten 2 units of blood and feels much better with hgb up to 7.7 today. Patient takes a daily baby asa, no other  NSAIDS. No abdominal pain, N/V or other GI symptoms.   Patient had same presentation with anemia and H+ stool in April 2018. EGD / colonoscopy by Dr. Gala Romney were unrevealing. Had a small bowel and per office note it showed old blood toward distal small bowel.     Past Medical History:  Diagnosis Date  . Aortic stenosis, mild   . Aortic valve stenosis, mild   . Arthritis   . Cancer ([place])    vulvar   . Complication of anesthesia   . Diabetes mellitus without complication ([place])    type 2  . Heart murmur   . History of blood transfusion age 32 or 27 months old  . History of radiation therapy 06/05/16 - 07/25/16   Pelvis treated to 45 Gy in 25 fractions, then Boosted an additional 18 Gy in 9 fractions.  . Hypertension   . IFG (impaired fasting glucose)   . PONV (postoperative nausea and vomiting)    years ago with back surgery-nausea  . Vitamin D deficiency     Past Surgical History:  Procedure Laterality Date  . ABDOMINAL HYSTERECTOMY     complete  . BACK SURGERY     upper  with plate frontal fusion and lower x 2  . COLONOSCOPY    . COLONOSCOPY N/A 05/05/2016   Procedure: COLONOSCOPY;  Surgeon: Daneil Dolin, MD;  [facility];  Service: Endoscopy;  Laterality: N/A;  .  ESOPHAGOGASTRODUODENOSCOPY N/A 05/04/2016   Procedure: ESOPHAGOGASTRODUODENOSCOPY (EGD);  Surgeon: Daneil Dolin, MD;  [facility];  Service: Endoscopy;  Laterality: N/A;  . GIVENS CAPSULE STUDY N/A 05/06/2016   Procedure: GIVENS CAPSULE STUDY;  Surgeon: Daneil Dolin, MD;  [facility];  Service: Endoscopy;  Laterality: N/A;  . KNEE SURGERY Left    arthroscopy  . RADICAL VULVECTOMY Left 04/09/2016   Procedure: RADICAL LEFT VULVECTOMY;  Surgeon: Everitt Amber, MD;  Location: [facility];  Service: Gynecology;  Laterality: Left;  . RADICAL VULVECTOMY  10/17/2016   Procedure: RADICAL ANTERIOR VULVECTOMY;  Surgeon: Everitt Amber, MD;  Location: [facility];  Service: Gynecology;;  . TOE SURGERY Left     . TONSILLECTOMY    . TOTAL HIP ARTHROPLASTY Left 10/27/2013   Procedure: LEFT TOTAL HIP ARTHROPLASTY;  Surgeon: Tobi Bastos, MD;  Location: [facility];  Service: Orthopedics;  Laterality: Left;  . TOTAL KNEE ARTHROPLASTY Right   . VULVA /PERINEUM BIOPSY  02/22/2016  . VULVECTOMY Right 02/27/2016   Procedure: RADICAL RIGHT VULVECTOMY WITH RIGHT INGUINAL LYMPH NODE DISSECTION;  Surgeon: Nancy Marus, MD;  Location: [facility];  Service: Gynecology;  Laterality: Right;    Prior to Admission medications   Medication Sig Start Date End Date Taking? Authorizing Provider  aspirin 81 MG tablet Take 81 mg by mouth every evening.    Yes [provider]  ferrous sulfate 325 (65 FE) MG tablet Take 1 tablet (325 mg total) by mouth daily with breakfast. 04/11/17  Yes Nilda Simmer, NP  furosemide (LASIX) 40 MG tablet TAKE (1) TABLET BY MOUTH ONCE DAILY. 02/07/17  Yes Mikey Kirschner, MD  gabapentin (NEURONTIN) 300 MG capsule Take 1 capsule (300 mg total) by mouth 3 (three) times daily. 04/11/17  Yes Nilda Simmer, NP  HYDROcodone-acetaminophen (NORCO) 5-325 MG tablet Take 1 tablet by mouth every 6 (six) hours as needed for moderate pain. 04/11/17  Yes Nilda Simmer, NP  losartan (COZAAR) 50 MG tablet TAKE ONE TABLET BY MOUTH ONCE DAILY. 12/23/16  Yes Mikey Kirschner, MD  metFORMIN (GLUCOPHAGE) 500 MG tablet TAKE (1) TABLET BY MOUTH TWICE DAILY. 04/15/17  Yes Mikey Kirschner, MD  potassium chloride SA (K-DUR,KLOR-CON) 20 MEQ tablet TAKE 1 TABLET BY MOUTH ONCE A DAY. 11/29/16  Yes Mikey Kirschner, MD  doxycycline (VIBRA-TABS) 100 MG tablet Take 1 tablet (100 mg total) by mouth 2 (two) times daily. Patient not taking: Reported on 04/20/2017 04/02/17   Nilda Simmer, NP  lidocaine (XYLOCAINE) 4 % external solution Apply topically as needed. Apply to right groin crease as needed for pain. Patient not taking: Reported on 04/20/2017 03/17/17   Everitt Amber, MD    Current  Facility-Administered Medications  Medication Dose Route Frequency Provider Last Rate Last Dose  . 0.9 %  sodium chloride infusion   Intravenous Continuous Georgette Shell, MD 75 mL/hr at 04/20/17 2044    . insulin aspart (novoLOG) injection 0-15 Units  0-15 Units Subcutaneous Q4H Donne Hazel, MD      . pantoprazole (PROTONIX) injection 40 mg  40 mg Intravenous Q12H Georgette Shell, MD   40 mg at 04/20/17 2039    Allergies as of 04/20/2017 - Review Complete 04/20/2017  Allergen Reaction Noted  . Oxycodone Other (See Comments) 10/24/2016  . Celebrex [celecoxib] Other (See Comments) 10/26/2014  . Doxycycline Nausea Only 05/21/2012  . Iron Other (See Comments) 05/29/2016  . Lodine [etodolac] Nausea Only 05/21/2012  .  Nabumetone Other (See Comments)   . Tetracycline Nausea And Vomiting   . Xarelto [rivaroxaban] Hives 10/26/2014    Family History  Problem Relation Age of Onset  . Hypertension Mother   . Hypertension Father   . Throat cancer Father   . Cancer Sister        Breast  . Hypertension Brother     Social History   Socioeconomic History  . Marital status: Married    Spouse name: Not on file  . Number of children: 2  . Years of education: Not on file  . Highest education level: Not on file  Occupational History  . Not on file  Social Needs  . Financial resource strain: Not on file  . Food insecurity:    Worry: Not on file    Inability: Not on file  . Transportation needs:    Medical: Not on file    Non-medical: Not on file  Tobacco Use  . Smoking status: Never Smoker  . Smokeless tobacco: Never Used  Substance and Sexual Activity  . Alcohol use: No    Alcohol/week: 0.0 oz  . Drug use: No  . Sexual activity: Not on file  Lifestyle  . Physical activity:    Days per week: Not on file    Minutes per session: Not on file  . Stress: Not on file  Relationships  . Social connections:    Talks on phone: Not on file    Gets together: Not on file     Attends religious service: Not on file    Active member of club or organization: Not on file    Attends meetings of clubs or organizations: Not on file    Relationship status: Not on file  . Intimate partner violence:    Fear of current or ex partner: Not on file    Emotionally abused: Not on file    Physically abused: Not on file    Forced sexual activity: Not on file  Other Topics Concern  . Not on file  Social History Narrative  . Not on file    Review of Systems: All systems reviewed and negative except where noted in HPI.  Physical Exam: Vital signs in last 24 hours: Temp:  [98.1 F (36.7 C)-99 F (37.2 C)] 98.1 F (36.7 C) (04/01 0540) Pulse Rate:  [84-101] 85 (04/01 0540) Resp:  [10-21] 18 (04/01 0540) BP: (99-135)/(48-70) 124/70 (04/01 0540) SpO2:  [94 %-100 %] 98 % (04/01 0540) Weight:  [197 lb 8.5 oz (89.6 kg)] 197 lb 8.5 oz (89.6 kg) (03/31 1818)   General:   Alert, pale white female in NAD Psych:  Pleasant, cooperative. Normal mood and affect. Eyes:  Pupils equal, sclera clear, no icterus.   Conjunctiva pink. Ears:  Normal auditory acuity. Nose:  No deformity, discharge,  or lesions. Neck:  Supple; no masses Lungs:  Clear throughout to auscultation.   No wheezes, crackles, or rhonchi.  Heart:  Regular rate and rhythm; soft murmur, no edema Abdomen:  Soft, non-distended, nontender, BS active, no palp mass    Rectal:  Deferred  Msk:  Symmetrical without gross deformities. . Neurologic:  Alert and  oriented x4;  grossly normal neurologically. Skin:  Intact without significant lesions or rashes..   Intake/Output from previous day: 03/31 0701 - 04/01 0700 In: 9562 [P.O.:120; I.V.:695; Blood:636] Out: -  Intake/Output this shift: No intake/output data recorded.  Lab Results: Recent Labs    04/18/17 0943 04/20/17 1410 04/21/17 0435  WBC  5.9 5.3 4.2  HGB 6.9* 5.5* 7.7*  HCT 21.3* 17.8* 23.7*  PLT 277 199 177   BMET Recent Labs    04/20/17 1410  04/21/17 0435  NA 142 142  K 3.8 3.8  CL 107 108  CO2 27 27  GLUCOSE 123* 106*  BUN 18 13  CREATININE 0.72 0.70  CALCIUM 8.8* 8.5*   LFT Recent Labs    04/21/17 0435  PROT 5.5*  ALBUMIN 2.9*  AST 13*  ALT 11*  ALKPHOS 48  BILITOT 0.9   PT/INR Recent Labs    04/21/17 0435  LABPROT 14.6  INR 1.15     Tye Savoy, NP-C @  04/21/2017, 8:52 AM  Pager number [phone]

## 2017-04-22 DIAGNOSIS — D649 Anemia, unspecified: Secondary | ICD-10-CM

## 2017-04-22 LAB — CBC
HCT: 31.3 % — ABNORMAL LOW (ref 36.0–46.0)
Hemoglobin: 9.2 g/dL — ABNORMAL LOW (ref 12.0–15.0)
MCH: 27 pg (ref 26.0–34.0)
MCHC: 29.4 g/dL — ABNORMAL LOW (ref 30.0–36.0)
MCV: 91.8 fL (ref 78.0–100.0)
PLATELETS: 161 10*3/uL (ref 150–400)
RBC: 3.41 MIL/uL — ABNORMAL LOW (ref 3.87–5.11)
RDW: 15.3 % (ref 11.5–15.5)
WBC: 3.5 10*3/uL — AB (ref 4.0–10.5)

## 2017-04-22 LAB — GLUCOSE, CAPILLARY
Glucose-Capillary: 104 mg/dL — ABNORMAL HIGH (ref 65–99)
Glucose-Capillary: 106 mg/dL — ABNORMAL HIGH (ref 65–99)
Glucose-Capillary: 108 mg/dL — ABNORMAL HIGH (ref 65–99)
Glucose-Capillary: 116 mg/dL — ABNORMAL HIGH (ref 65–99)

## 2017-04-22 NOTE — Progress Notes (Signed)
Discharge instructions and medications discussed with patient.  AVS given to patient.  All questions answered.  

## 2017-04-22 NOTE — Discharge Summary (Signed)
Physician Discharge Summary  [name] KYH:[id number] DOB: 27-May-1940 DOA: 04/20/2017  PCP: Mikey Kirschner, MD  Admit date: 04/20/2017 Discharge date: 04/22/2017  Admitted From: Home Disposition:  Home  Recommendations for Outpatient Follow-up:  1. Follow up with PCP in 2-3 weeks 2. Recommend repeat CBC in 1 week 3. Follow up with GI as scheduled  Discharge Condition:Improved CODE STATUS:Full Diet recommendation: Diabetic   Brief/Interim Summary: 77 y.o.femalewith medical history significant ofvulvar cancer, diabetes type 2, history of chronic heart murmur, history of radiation therapy to the pelvis, aortic stenosis mild, admitted with complaints of generalized weakness and shortness of breath. A week ago her hemoglobin was 9 and today is come down to 5.5. Patient reports that her stool has been black for over 3 weeks. She was found to be FOBT positive prior to admission. On 3 separate stool collection cards. She denies chest pain. However she does feel short of breath. No fever chills nausea vomiting diarrhea abdominal pain back pain headache changes with her vision. No hematemesis. Reports melena. History of peptic ulcer disease. Shetakes aspirin daily 81 mg. Patient found to have hgb 5.5, given 2 units PRBC's. Patient admitted for further work up.   1]Upper GI bleed  -patient presented with complaints of generalized weakness and dyspnea on exertion with drop in hemoglobinto 5.5 today with melena.  -Hgb appropriately corrected with 2 units of blood -Patient had been continued on PPI -Underwent EGD on 4/1 with normal findings -Repeat hgb of 9.2 -discussed with GI. OK to discharge today, follow up as outpatient  2]type 2 diabetes -metformin on hold this AM -continued on SSI coverage  3]history of vulvar cancer.  -Status post adjuvant vulvar and inguinal radiatio with radical right vulvectomy and inguinal lymphadenectomy. -Patient followed by  gyn-onc   Discharge Diagnoses:  Active Problems:   GI bleed   Iron deficiency anemia   Melena    Discharge Instructions   Allergies as of 04/22/2017      Reactions   Oxycodone Other (See Comments)   Intense H/A   Celebrex [celecoxib] Other (See Comments)   GI upset   Doxycycline Nausea Only   Gi upset Pt is able to tolerate with food   Iron Other (See Comments)   Pt cannot tolerate some iron preparations. The upset her stomach.   Lodine [etodolac] Nausea Only   Gi upset   Nabumetone Other (See Comments)   (relafen) unknown   Tetracycline Nausea And Vomiting   Xarelto [rivaroxaban] Hives      Medication List    STOP taking these medications   doxycycline 100 MG tablet Commonly known as:  VIBRA-TABS   lidocaine 4 % external solution Commonly known as:  XYLOCAINE     TAKE these medications   aspirin 81 MG tablet Take 81 mg by mouth every evening.   ferrous sulfate 325 (65 FE) MG tablet Take 1 tablet (325 mg total) by mouth daily with breakfast.   furosemide 40 MG tablet Commonly known as:  LASIX TAKE (1) TABLET BY MOUTH ONCE DAILY.   gabapentin 300 MG capsule Commonly known as:  NEURONTIN Take 1 capsule (300 mg total) by mouth 3 (three) times daily.   HYDROcodone-acetaminophen 5-325 MG tablet Commonly known as:  NORCO Take 1 tablet by mouth every 6 (six) hours as needed for moderate pain.   losartan 50 MG tablet Commonly known as:  COZAAR TAKE ONE TABLET BY MOUTH ONCE DAILY.   metFORMIN 500 MG tablet Commonly known as:  GLUCOPHAGE TAKE (  1) TABLET BY MOUTH TWICE DAILY.   potassium chloride SA 20 MEQ tablet Commonly known as:  K-DUR,KLOR-CON TAKE 1 TABLET BY MOUTH ONCE A DAY.      Follow-up Information    Mikey Kirschner, MD. Schedule an appointment as soon as possible for a visit in 2 week(s).   Specialty:  Family Medicine Contact information: [place] [address] [phone]        Daneil Dolin, MD.  Schedule an appointment as soon as possible for a visit.   Specialty:  Gastroenterology Contact information: [place] [address] [phone]          Allergies  Allergen Reactions  . Oxycodone Other (See Comments)    Intense H/A  . Celebrex [Celecoxib] Other (See Comments)    GI upset   . Doxycycline Nausea Only    Gi upset Pt is able to tolerate with food  . Iron Other (See Comments)    Pt cannot tolerate some iron preparations. The upset her stomach.  . Lodine [Etodolac] Nausea Only    Gi upset  . Nabumetone Other (See Comments)    (relafen) unknown  . Tetracycline Nausea And Vomiting  . Xarelto [Rivaroxaban] Hives    Consultations:  GI  Procedures/Studies:  No results found.  Subjective: Eager to go home  Discharge Exam: Vitals:   04/21/17 2024 04/22/17 0300  BP: 135/77 136/61  Pulse: 81 86  Resp: 18 20  Temp: 99.1 F (37.3 C) 98.1 F (36.7 C)  SpO2: 98% 97%   Vitals:   04/21/17 1330 04/21/17 1535 04/21/17 2024 04/22/17 0300  BP: (!) 123/55 111/60 135/77 136/61  Pulse: 84 82 81 86  Resp: (!) 22 20 18 20   Temp:  98.6 F (37 C) 99.1 F (37.3 C) 98.1 F (36.7 C)  TempSrc:   Oral Oral  SpO2: 97% 97% 98% 97%  Weight:      Height:        General: Pt is alert, awake, not in acute distress Cardiovascular: RRR, S1/S2 +, no rubs, no gallops Respiratory: CTA bilaterally, no wheezing, no rhonchi Abdominal: Soft, NT, ND, bowel sounds + Extremities: no edema, no cyanosis   The results of significant diagnostics from this hospitalization (including imaging, microbiology, ancillary and laboratory) are listed below for reference.     Microbiology: No results found for this or any previous visit (from the past 240 hour(s)).   Labs: BNP (last 3 results) No results for input(s): BNP in the last 8760 hours. Basic Metabolic Panel: Recent Labs  Lab 04/20/17 1410 04/21/17 0435  NA 142 142  K 3.8 3.8  CL 107 108  CO2 27 27   GLUCOSE 123* 106*  BUN 18 13  CREATININE 0.72 0.70  CALCIUM 8.8* 8.5*   Liver Function Tests: Recent Labs  Lab 04/20/17 1410 04/21/17 0435  AST 14* 13*  ALT 13* 11*  ALKPHOS 58 48  BILITOT 0.3 0.9  PROT 6.0* 5.5*  ALBUMIN 3.2* 2.9*   No results for input(s): LIPASE, AMYLASE in the last 168 hours. No results for input(s): AMMONIA in the last 168 hours. CBC: Recent Labs  Lab 04/18/17 0943 04/20/17 1410 04/21/17 0435 04/22/17 0442  WBC 5.9 5.3 4.2 3.5*  NEUTROABS 4.5  --   --   --   HGB 6.9* 5.5* 7.7* 9.2*  HCT 21.3* 17.8* 23.7* 31.3*  MCV 87 90.4 89.8 91.8  PLT 277 199 177 161   Cardiac Enzymes: No results  for input(s): CKTOTAL, CKMB, CKMBINDEX, TROPONINI in the last 168 hours. BNP: Invalid input(s): POCBNP CBG: Recent Labs  Lab 04/21/17 1612 04/21/17 2032 04/22/17 0038 04/22/17 0442 04/22/17 0816  GLUCAP 120* 111* 116* 106* 104*   D-Dimer No results for input(s): DDIMER in the last 72 hours. Hgb A1c No results for input(s): HGBA1C in the last 72 hours. Lipid Profile No results for input(s): CHOL, HDL, LDLCALC, TRIG, CHOLHDL, LDLDIRECT in the last 72 hours. Thyroid function studies No results for input(s): TSH, T4TOTAL, T3FREE, THYROIDAB in the last 72 hours.  Invalid input(s): FREET3 Anemia work up No results for input(s): VITAMINB12, FOLATE, FERRITIN, TIBC, IRON, RETICCTPCT in the last 72 hours. Urinalysis    Component Value Date/Time   COLORURINE YELLOW 05/03/2016 1316   APPEARANCEUR HAZY (A) 05/03/2016 1316   LABSPEC 1.025 05/03/2016 1316   PHURINE 5.0 05/03/2016 1316   GLUCOSEU NEGATIVE 05/03/2016 1316   HGBUR NEGATIVE 05/03/2016 1316   BILIRUBINUR NEGATIVE 05/03/2016 1316   KETONESUR NEGATIVE 05/03/2016 1316   PROTEINUR 30 (A) 05/03/2016 1316   UROBILINOGEN 0.2 10/21/2013 1124   NITRITE NEGATIVE 05/03/2016 1316   LEUKOCYTESUR LARGE (A) 05/03/2016 1316   Sepsis Labs Invalid input(s): PROCALCITONIN,  WBC,  LACTICIDVEN Microbiology No  results found for this or any previous visit (from the past 240 hour(s)).   SIGNED:   Marylu Lund, MD  [facility] 04/22/2017, 11:18 AM  If 7PM-7AM, please contact night-coverage [email or web address] Password [email or web address]

## 2017-04-23 ENCOUNTER — Encounter: Payer: Self-pay | Admitting: Gynecologic Oncology

## 2017-04-23 ENCOUNTER — Inpatient Hospital Stay: Payer: PPO | Attending: Gynecologic Oncology | Admitting: Gynecologic Oncology

## 2017-04-23 VITALS — BP 108/56 | HR 84 | Temp 98.0°F | Resp 20 | Wt 201.5 lb

## 2017-04-23 DIAGNOSIS — N764 Abscess of vulva: Secondary | ICD-10-CM | POA: Insufficient documentation

## 2017-04-23 DIAGNOSIS — Z923 Personal history of irradiation: Secondary | ICD-10-CM | POA: Diagnosis not present

## 2017-04-23 DIAGNOSIS — C519 Malignant neoplasm of vulva, unspecified: Secondary | ICD-10-CM | POA: Diagnosis not present

## 2017-04-23 DIAGNOSIS — Z79899 Other long term (current) drug therapy: Secondary | ICD-10-CM | POA: Diagnosis not present

## 2017-04-23 DIAGNOSIS — E559 Vitamin D deficiency, unspecified: Secondary | ICD-10-CM | POA: Diagnosis not present

## 2017-04-23 DIAGNOSIS — C774 Secondary and unspecified malignant neoplasm of inguinal and lower limb lymph nodes: Secondary | ICD-10-CM | POA: Diagnosis not present

## 2017-04-23 DIAGNOSIS — E119 Type 2 diabetes mellitus without complications: Secondary | ICD-10-CM | POA: Insufficient documentation

## 2017-04-23 DIAGNOSIS — I10 Essential (primary) hypertension: Secondary | ICD-10-CM

## 2017-04-23 DIAGNOSIS — Z90710 Acquired absence of both cervix and uterus: Secondary | ICD-10-CM | POA: Insufficient documentation

## 2017-04-23 NOTE — Patient Instructions (Signed)
We will call you with the results of the CT scan scheduled for April 10,2019 at 1 pm. Follow up appointment is on June 6,2019 with Dr. Denman George. Call prior to appointment if any issues or concerns.

## 2017-04-23 NOTE — Progress Notes (Signed)
Consult Note: Gyn-Onc  Consult was requested by Dr. Elonda Husky for the evaluation of [name] 77 y.o. female   CC:  Chief Complaint  Patient presents with  . Vulva cancer [facility])    Assessment/Plan:  [name]  is a 77 y.o.  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 S/p adjuvant vulvar and inguinal (bilateral) radiation. Close urethral margin on recurrent carcinoma specimen.  Anterior mons caruncle - s/p I&D. No cellulitis but appears necrotic. Debridement not indicated at this time. Will continue to monitor - will inspect in 2 months.  Neuropathic pain in right groin - already on Neurontin -recommended to continue 3 times per day. Try topical xylocaine.   Recent GI bleed. Ordered CT abd/pelvis to rule out abdominal recurrence as a source.   Urinary incontinence (likely from urethral sphincter weakness). Recommend eval by Dr Matilde Sprang. Patient prefers to hold off while recovering from GI bleeding.   Recommend close surveillance - will see her again in 1 months to monitor nodule on mons and peri-urethral region.  HPI: [name] is a 77 year old woman who is seen in consultation at the request of Dr Elonda Husky for a right labial lesion.    The patient reports that she began feeling pruritis of the vulva in October, 2017. She was seen by her PCP who saw no lesion and prescribed estradiol.  The pruritis persisted and became worse and she began to feel a nodule in the area and therefore again saw her PCP followed by a referral to gynecologist, Dr Elonda Husky on 02/20/16. The nodular lesion was appreciated on the right vulva. No biopsy was taken at that time. Biopsy was performed on 02/22/16 and pathology from biopsy confirmed invasive SCC.   CT scan abdo/pelvis was negative for evidence of suspicious nodes or distant mets.  On 02/22/16 she underwent right radical vulvectomy and right inguinal  lymphadenectomy with Dr Nancy Marus. Final pathology revealed: a 2.1cm moderately differentiated SCC of the right vulva. Margins were negative (closest was 3 o'clock, 69mm). Depth of invasion was 41mm. 1 of 6 inguinofemoral lymph nodes (right) was positive for metastatic disease.   CT chest on 04/01/16 showed: Multiple pulmonary nodules as detailed above, generally in the 12mm and less average diameter range although with 1 right lower lobe subpleural nodule measuring about 8 mm in diameter. These likely warrant surveillance. Hypodense right thyroid nodule, 1.1 cm in long axis. Consider further evaluation with thyroid ultrasound. If patient is clinically hyperthyroid, consider nuclear medicine thyroid uptake and scan. Stable hypodense lateral splenic lesion, likely a cyst.  Postoperatively after her first vulvectomy she developed irritation on the left vulvar lip. She saw [place] for evaluation in March and complained of this irritation. A nodular lesion was seen in the mid portion of the left labia minora in a "kissing" location to where the primary lesion had been. It was biopsied and confirmed as recurrent SCC of the vulva.  On 04/09/16 she was again taken to the OR, this time for a radical left vulvectomy. A decision was made to not perform left inguinal lymphadenectomy, so as to not delay radiation further (with an additional groin wound to heal), but to instead extend the radiation to both groins.  Pathology from this surgery confirmed a poorly differentiated squamous cell carcinoma of the left vulva with 36mm of invasion. The inferior 6 o'clock margin was "broadly" positive (though macroscopically this margin was grossly negative.  She was recommended to receive adjuvant radiation to the vulva and groins. Radiation treatment dates of 06/05/16-07/25/16 where she received 45Gy in 3fractions directed at the bilateral inguinal and vulvar area. The patient then proceeded with a boost to the vulvar region the light  of the positive surgical margin and continue to a dose of 63 gray.  She developed burning and itching on the anterior right vulva in August, 2018 and a 2cm lesion was seen on the anterior vulva approximating the urethral meatus. It was biopsied in September, 2018 and this confirmed recurrent SCC.  On 10/17/16 she underwent radical anterior vulvectomy. A 3cm raised vulvar lesion was seen at the midline and right vulva extending to within 0.5cm (macroscopically) to the urethral meatus.  Final pathology confirmed SCC, 2.7cm with 57mm invasion, negative magins though there was a very close (43mm) urethral margin.  Repeat Chest CT in September, 2018 showed stable benign appearing nodules.  She saw Dr Sondra Come in December, 2018 and there was concern for possible recurrence at the periurethral incision area due to necrosis.  She was being followed for a 1cm mass at the mons pubis incision.  She has noted this area getting larger and with burning sensation when it touches the fabric of her clothes.   A 2.5cm fluctuant area was noted to the right of the midline mons incision. It was biopsied on 02/17/17 with pathology showing no recurrence only inflammatory changes consistent with an abscess.  She had some initial mild relief to the office I&D, but subseqently developed increasing pain again. She denies fevers. She has separate right inguinal crease neuralgia (burning pain). Minimal relief from gabapentin BID.   Interval Hx:  She was discharged from hospital on 04/22/17 for a GI bleed after receiving transfusion. No imaging performed. She had a GI bleed in April, 2018 with a colonoscopy and swallow study which showed no source.  + urinary incontinence (dribble) Continued groin burning.   Current Meds:  Outpatient Encounter Medications as of 04/23/2017  Medication Sig  . aspirin 81 MG tablet Take 81 mg by mouth every evening.   . ferrous sulfate 325 (65 FE) MG tablet Take 1 tablet (325 mg total) by mouth  daily with breakfast.  . furosemide (LASIX) 40 MG tablet TAKE (1) TABLET BY MOUTH ONCE DAILY.  Marland Kitchen gabapentin (NEURONTIN) 300 MG capsule Take 1 capsule (300 mg total) by mouth 3 (three) times daily.  Marland Kitchen HYDROcodone-acetaminophen (NORCO) 5-325 MG tablet Take 1 tablet by mouth every 6 (six) hours as needed for moderate pain.  Marland Kitchen losartan (COZAAR) 50 MG tablet TAKE ONE TABLET BY MOUTH ONCE DAILY.  . metFORMIN (GLUCOPHAGE) 500 MG tablet TAKE (1) TABLET BY MOUTH TWICE DAILY.  Marland Kitchen potassium chloride SA (K-DUR,KLOR-CON) 20 MEQ tablet TAKE 1 TABLET BY MOUTH ONCE A DAY.   No facility-administered encounter medications on file as of 04/23/2017.     Allergy:  Allergies  Allergen Reactions  . Oxycodone Other (See Comments)    Intense H/A  . Celebrex [Celecoxib] Other (See Comments)    GI upset   . Doxycycline Nausea Only    Gi upset Pt is able to tolerate with food  . Iron Other (See Comments)    Pt cannot tolerate some iron preparations. The upset her stomach.  . Lodine [Etodolac] Nausea Only    Gi upset  . Nabumetone Other (See Comments)    (relafen) unknown  . Tetracycline Nausea And Vomiting  . Xarelto [Rivaroxaban] Hives    Social Hx:  Social History   Socioeconomic History  . Marital status: Married    Spouse name: Not on file  . Number of children: 2  . Years of education: Not on file  . Highest education level: Not on file  Occupational History  . Not on file  Social Needs  . Financial resource strain: Not on file  . Food insecurity:    Worry: Not on file    Inability: Not on file  . Transportation needs:    Medical: Not on file    Non-medical: Not on file  Tobacco Use  . Smoking status: Never Smoker  . Smokeless tobacco: Never Used  Substance and Sexual Activity  . Alcohol use: No    Alcohol/week: 0.0 oz  . Drug use: No  . Sexual activity: Not on file  Lifestyle  . Physical activity:    Days per week: Not on file    Minutes per session: Not on file  . Stress: Not  on file  Relationships  . Social connections:    Talks on phone: Not on file    Gets together: Not on file    Attends religious service: Not on file    Active member of club or organization: Not on file    Attends meetings of clubs or organizations: Not on file    Relationship status: Not on file  . Intimate partner violence:    Fear of current or ex partner: Not on file    Emotionally abused: Not on file    Physically abused: Not on file    Forced sexual activity: Not on file  Other Topics Concern  . Not on file  Social History Narrative  . Not on file    Past Surgical Hx:  Past Surgical History:  Procedure Laterality Date  . ABDOMINAL HYSTERECTOMY     complete  . BACK SURGERY     upper  with plate frontal fusion and lower x 2  . COLONOSCOPY    . COLONOSCOPY N/A 05/05/2016   Procedure: COLONOSCOPY;  Surgeon: Daneil Dolin, MD;  Location: [facility];  Service: Endoscopy;  Laterality: N/A;  . ESOPHAGOGASTRODUODENOSCOPY N/A 05/04/2016   Procedure: ESOPHAGOGASTRODUODENOSCOPY (EGD);  Surgeon: Daneil Dolin, MD;  Location: [facility];  Service: Endoscopy;  Laterality: N/A;  . ESOPHAGOGASTRODUODENOSCOPY N/A 04/21/2017   Procedure: ESOPHAGOGASTRODUODENOSCOPY (EGD);  Surgeon: Ladene Artist, MD;  Location: [facility];  Service: Endoscopy;  Laterality: N/A;  . GIVENS CAPSULE STUDY N/A 05/06/2016   Procedure: GIVENS CAPSULE STUDY;  Surgeon: Daneil Dolin, MD;  Location: [facility];  Service: Endoscopy;  Laterality: N/A;  . KNEE SURGERY Left    arthroscopy  . RADICAL VULVECTOMY Left 04/09/2016   Procedure: RADICAL LEFT VULVECTOMY;  Surgeon: Everitt Amber, MD;  Location: [facility];  Service: Gynecology;  Laterality: Left;  . RADICAL VULVECTOMY  10/17/2016   Procedure: RADICAL ANTERIOR VULVECTOMY;  Surgeon: Everitt Amber, MD;  Location: [facility];  Service: Gynecology;;  . TOE SURGERY Left   . TONSILLECTOMY    . TOTAL HIP ARTHROPLASTY Left 10/27/2013   Procedure: LEFT TOTAL HIP  ARTHROPLASTY;  Surgeon: Tobi Bastos, MD;  Location: [facility];  Service: Orthopedics;  Laterality: Left;  . TOTAL KNEE ARTHROPLASTY Right   . VULVA /PERINEUM BIOPSY  02/22/2016  . VULVECTOMY Right 02/27/2016   Procedure: RADICAL RIGHT VULVECTOMY WITH RIGHT INGUINAL LYMPH NODE DISSECTION;  Surgeon: Nancy Marus, MD;  Location: [facility];  Service: Gynecology;  Laterality: Right;  Past Medical Hx:  Past Medical History:  Diagnosis Date  . Aortic stenosis, mild   . Aortic valve stenosis, mild   . Arthritis   . Cancer ([place])    vulvar   . Complication of anesthesia   . Diabetes mellitus without complication ([place])    type 2  . Heart murmur   . History of blood transfusion age 26 or 74 months old  . History of radiation therapy 06/05/16 - 07/25/16   Pelvis treated to 45 Gy in 25 fractions, then Boosted an additional 18 Gy in 9 fractions.  . Hypertension   . IFG (impaired fasting glucose)   . PONV (postoperative nausea and vomiting)    years ago with back surgery-nausea  . Vitamin D deficiency     Past Gynecological History:  Remote hx of hysterectomy for fibroids. No hx of abnromal paps (last pap remote) No LMP recorded. Patient has had a hysterectomy.  Family Hx:  Family History  Problem Relation Age of Onset  . Hypertension Mother   . Hypertension Father   . Throat cancer Father   . Cancer Sister        Breast  . Hypertension Brother     Review of Systems:  Constitutional  Feels well,    ENT Normal appearing ears and nares bilaterally Skin/Breast  + vulvar pruritis. Cardiovascular  No chest pain, shortness of breath, or edema  Pulmonary  No cough or wheeze.  Gastro Intestinal  No nausea, vomitting, or diarrhoea. No bright red blood per rectum, no abdominal pain, change in bowel movement, or constipation.  Genito Urinary  No frequency, urgency, dysuria, no bleeding Musculo Skeletal  No myalgia, arthralgia, joint swelling or pain  Neurologic  No weakness, numbness,  change in gait,  Psychology  No depression, anxiety, insomnia.   Vitals:  Blood pressure (!) 108/56, pulse 84, temperature 98 F (36.7 C), temperature source Oral, resp. rate 20, weight 201 lb 8 oz (91.4 kg), SpO2 99 %.  Physical Exam: WD in NAD Neck  Supple NROM, without any enlargements.  Lymph Node Survey No cervical supraclavicular or inguinal adenopathy. Right groin incision well healed.  Cardiovascular  Pulse normal rate, regularity and rhythm. S1 and S2 normal.  Lungs  Clear to auscultation bilateraly, without wheezes/crackles/rhonchi. Good air movement.  Skin  No rash/lesions/breakdown  Psychiatry  Alert and oriented to person, place, and time  Abdomen  Normoactive bowel sounds, abdomen soft, non-tender and obese without evidence of hernia.  Back No CVA tenderness Genito Urinary  Vulva/vagina: Radiation changes present. Edematous mons pubis. Anterior mons incision in tact, persistent wound separation of peri-urethral incision with somewhat necrotic base, no evidence of cellulitis, no evidence of recurrent tumor, though there is some induration of the surrounding skin edge.  No spreading erythema on surrounding skin. The area of hyperaesthesia on the right groin crease is free of lesions or masses. There is no palpable or visible periurethral tumor.  Bladder/urethra:  No lesions or masses, well supported bladder  Vagina: atrophic, no lesions  Cervix: surgically absent  Uterus: surgically absen  Adnexa: no palpable masses. Rectal  deferred Extremities  No bilateral cyanosis, clubbing or edema.    Thereasa Solo, MD  04/23/2017, 5:15 PM

## 2017-04-30 ENCOUNTER — Ambulatory Visit (HOSPITAL_COMMUNITY)
Admission: RE | Admit: 2017-04-30 | Discharge: 2017-04-30 | Disposition: A | Discharge status: Home / Self Care | Payer: PPO | Source: Ambulatory Visit | Attending: Gynecologic Oncology | Admitting: Gynecologic Oncology

## 2017-04-30 ENCOUNTER — Encounter (HOSPITAL_COMMUNITY): Payer: Self-pay

## 2017-04-30 DIAGNOSIS — C519 Malignant neoplasm of vulva, unspecified: Secondary | ICD-10-CM

## 2017-04-30 MED ORDER — IOHEXOL 300 MG/ML  SOLN
100.0000 mL | Freq: Once | INTRAMUSCULAR | Status: AC | PRN
  Administered 2017-04-30: 100 mL via INTRAVENOUS

## 2017-05-01 DIAGNOSIS — E119 Type 2 diabetes mellitus without complications: Secondary | ICD-10-CM | POA: Diagnosis not present

## 2017-05-01 DIAGNOSIS — Z9849 Cataract extraction status, unspecified eye: Secondary | ICD-10-CM | POA: Diagnosis not present

## 2017-05-01 DIAGNOSIS — Z961 Presence of intraocular lens: Secondary | ICD-10-CM | POA: Diagnosis not present

## 2017-05-01 DIAGNOSIS — H17821 Peripheral opacity of cornea, right eye: Secondary | ICD-10-CM | POA: Diagnosis not present

## 2017-05-01 LAB — HM DIABETES EYE EXAM

## 2017-05-02 ENCOUNTER — Telehealth: Payer: Self-pay | Admitting: Gynecologic Oncology

## 2017-05-02 NOTE — Telephone Encounter (Signed)
Patient called wanting CT scan results.  Discussed scan and advised patient that Dr. Denman George is aware and will be contacting her with additional recommendations.  No concerns voiced.  Patient states she feels like there is fluid on her vulva because it feels firm but no fever, chills, or other issues reported.  Advised to call for any needs.

## 2017-05-07 ENCOUNTER — Telehealth: Payer: Self-pay

## 2017-05-07 NOTE — Telephone Encounter (Signed)
Told [name] that Dr. Denman George wants to see her on 4-23 to discuss the CT results in depth and probably recommend examination under anesthesia.  Or time being held for this purpose on 05-15-17. Pt verbalized understanding.

## 2017-05-09 ENCOUNTER — Telehealth: Payer: Self-pay | Admitting: Nurse Practitioner

## 2017-05-09 MED ORDER — HYDROCODONE-ACETAMINOPHEN 5-325 MG PO TABS: ORAL_TABLET | ORAL | 0 refills | Status: DC

## 2017-05-09 NOTE — Telephone Encounter (Signed)
Rx written awaiting Dr.Signature.

## 2017-05-09 NOTE — Telephone Encounter (Signed)
Pt may have rx for 50 tablets, 1 q4 prn pain caution drowsiness

## 2017-05-09 NOTE — Telephone Encounter (Signed)
Pt called stating that she is needing a refill on  HYDROcodone-acetaminophen (NORCO) 5-325 MG tablet Pt states that she has an appt with Dr. Denman George on Tuesday and they have scheduled the OR for the place on her bottom. Please advise.    [name]

## 2017-05-09 NOTE — Telephone Encounter (Signed)
Patient is aware to pick up today at the front office.

## 2017-05-09 NOTE — Telephone Encounter (Signed)
Please advise 

## 2017-05-13 ENCOUNTER — Encounter: Payer: Self-pay | Admitting: Gynecologic Oncology

## 2017-05-13 ENCOUNTER — Other Ambulatory Visit: Payer: Self-pay

## 2017-05-13 ENCOUNTER — Encounter (HOSPITAL_BASED_OUTPATIENT_CLINIC_OR_DEPARTMENT_OTHER): Payer: Self-pay | Admitting: *Deleted

## 2017-05-13 ENCOUNTER — Inpatient Hospital Stay (HOSPITAL_BASED_OUTPATIENT_CLINIC_OR_DEPARTMENT_OTHER): Payer: PPO | Admitting: Gynecologic Oncology

## 2017-05-13 VITALS — BP 111/60 | HR 89 | Temp 98.3°F | Resp 20 | Ht 62.0 in | Wt 192.5 lb

## 2017-05-13 DIAGNOSIS — C519 Malignant neoplasm of vulva, unspecified: Secondary | ICD-10-CM

## 2017-05-13 DIAGNOSIS — N764 Abscess of vulva: Secondary | ICD-10-CM

## 2017-05-13 MED ORDER — AMOXICILLIN-POT CLAVULANATE 875-125 MG PO TABS: 1.0000 | ORAL_TABLET | Freq: Two times a day (BID) | ORAL | 1 refills | Status: DC

## 2017-05-13 NOTE — Progress Notes (Signed)
SPOKE W/ PT VIA PHONE FOR PRE-OP INTERVIEW.  NPO AFTER MN.  ARRIVE AT 0830.  CBC , DATED 04-22-2017, AND CMET, DATED 04-21-2017, IN CHART AND Epic.  CURRENT EKG AND CHEST CT IN CHART AND Epic.  WILL TAKE GABAPENTIN AM DOS W/ SIPS OF WATER.

## 2017-05-13 NOTE — Patient Instructions (Signed)
Plan on starting Augmentin twice daily for 2 weeks starting today.  Plan to have an exam under anesthesia, possible vulvar biopsies, debridement of vulvar wound at the [facility] on May 15, 2017.  You will receive a phone call from the pre-surgical RN to discuss instructions.  Please call for any questions or concerns.

## 2017-05-13 NOTE — Progress Notes (Signed)
Consult Note: Gyn-Onc  Consult was requested by Dr. Elonda Husky for the evaluation of [name] 77 y.o. female   CC:  Chief Complaint  Patient presents with  . Vulva cancer [facility])    Assessment/Plan:  Ms. MARLEN KOMAN  is a 77 y.o.  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 S/p adjuvant vulvar and inguinal (bilateral) radiation. Close urethral margin on recurrent carcinoma specimen.  Anterior mons caruncle appears necrotic with cellulitis. Recommend augmentin. Have scheduled for debridement in 48 hours. Counseled that she will require to pack the area postop. We will biopsy for possible occult cancer at the base.  Neuropathic pain in right groin - already on Neurontin -recommended to continue 3 times per day.  Urinary incontinence (likely from urethral sphincter weakness). Recommend eval by Dr Matilde Sprang. Patient prefers to hold off while recovering from GI bleeding.   HPI: [name] is a 77 year old woman who is seen in consultation at the request of Dr Elonda Husky for a right labial lesion.    The patient reports that she began feeling pruritis of the vulva in October, 2017. She was seen by her PCP who saw no lesion and prescribed estradiol.  The pruritis persisted and became worse and she began to feel a nodule in the area and therefore again saw her PCP followed by a referral to gynecologist, Dr Elonda Husky on 02/20/16. The nodular lesion was appreciated on the right vulva. No biopsy was taken at that time. Biopsy was performed on 02/22/16 and pathology from biopsy confirmed invasive SCC.   CT scan abdo/pelvis was negative for evidence of suspicious nodes or distant mets.  On 02/22/16 she underwent right radical vulvectomy and right inguinal lymphadenectomy with Dr Nancy Marus. Final pathology revealed: a 2.1cm moderately differentiated SCC of the right vulva. Margins were negative (closest was 3 o'clock,  41mm). Depth of invasion was 79mm. 1 of 6 inguinofemoral lymph nodes (right) was positive for metastatic disease.   CT chest on 04/01/16 showed: Multiple pulmonary nodules as detailed above, generally in the 26mm and less average diameter range although with 1 right lower lobe subpleural nodule measuring about 8 mm in diameter. These likely warrant surveillance. Hypodense right thyroid nodule, 1.1 cm in long axis. Consider further evaluation with thyroid ultrasound. If patient is clinically hyperthyroid, consider nuclear medicine thyroid uptake and scan. Stable hypodense lateral splenic lesion, likely a cyst.  Postoperatively after her first vulvectomy she developed irritation on the left vulvar lip. She saw [place] for evaluation in March and complained of this irritation. A nodular lesion was seen in the mid portion of the left labia minora in a "kissing" location to where the primary lesion had been. It was biopsied and confirmed as recurrent SCC of the vulva.  On 04/09/16 she was again taken to the OR, this time for a radical left vulvectomy. A decision was made to not perform left inguinal lymphadenectomy, so as to not delay radiation further (with an additional groin wound to heal), but to instead extend the radiation to both groins.  Pathology from this surgery confirmed a poorly differentiated squamous cell carcinoma of the left vulva with 79mm of invasion. The inferior 6 o'clock margin was "broadly" positive (though macroscopically this margin was grossly negative.  She was recommended to receive adjuvant radiation to the vulva and groins. Radiation treatment dates of 06/05/16-07/25/16 where she received 45Gy in 42fractions directed at the bilateral inguinal and vulvar  area. The patient then proceeded with a boost to the vulvar region the light of the positive surgical margin and continue to a dose of 63 gray.  She developed burning and itching on the anterior right vulva in August, 2018 and a 2cm lesion  was seen on the anterior vulva approximating the urethral meatus. It was biopsied in September, 2018 and this confirmed recurrent SCC.  On 10/17/16 she underwent radical anterior vulvectomy. A 3cm raised vulvar lesion was seen at the midline and right vulva extending to within 0.5cm (macroscopically) to the urethral meatus.  Final pathology confirmed SCC, 2.7cm with 107mm invasion, negative magins though there was a very close (77mm) urethral margin.  Repeat Chest CT in September, 2018 showed stable benign appearing nodules.  She saw Dr Sondra Come in December, 2018 and there was concern for possible recurrence at the periurethral incision area due to necrosis.  She was being followed for a 1cm mass at the mons pubis incision.  She has noted this area getting larger and with burning sensation when it touches the fabric of her clothes.   A 2.5cm fluctuant area was noted to the right of the midline mons incision. It was biopsied on 02/17/17 with pathology showing no recurrence only inflammatory changes consistent with an abscess.  She had some initial mild relief to the office I&D, but subseqently developed increasing pain again. She denies fevers. She has separate right inguinal crease neuralgia (burning pain). Minimal relief from gabapentin BID.   Interval Hx:  She was discharged from hospital on 04/22/17 for a GI bleed after receiving transfusion. No imaging performed. She had a GI bleed in April, 2018 with a colonoscopy and swallow study which showed no source.  Continued groin burning.   We ordered a CT scan on 05/01/17 to evaluate for occult source of GI bleeding. It demonstrated a 4+cm area of gas filled tissue on the right anterior mons consistent with either abscess vs recurrent necrotic tumor.  Current Meds:  Outpatient Encounter Medications as of 05/13/2017  Medication Sig  . aspirin 81 MG tablet Take 81 mg by mouth every evening.   . ferrous sulfate 325 (65 FE) MG tablet Take 1 tablet (325  mg total) by mouth daily with breakfast.  . furosemide (LASIX) 40 MG tablet TAKE (1) TABLET BY MOUTH ONCE DAILY.  Marland Kitchen gabapentin (NEURONTIN) 300 MG capsule Take 1 capsule (300 mg total) by mouth 3 (three) times daily.  Marland Kitchen HYDROcodone-acetaminophen (NORCO) 5-325 MG tablet One tablet Q 4 hours prn. Caution drowsiness.  Marland Kitchen losartan (COZAAR) 50 MG tablet TAKE ONE TABLET BY MOUTH ONCE DAILY.  . metFORMIN (GLUCOPHAGE) 500 MG tablet TAKE (1) TABLET BY MOUTH TWICE DAILY.  Marland Kitchen potassium chloride SA (K-DUR,KLOR-CON) 20 MEQ tablet TAKE 1 TABLET BY MOUTH ONCE A DAY.  Marland Kitchen amoxicillin-clavulanate (AUGMENTIN) 875-125 MG tablet Take 1 tablet by mouth 2 (two) times daily.   No facility-administered encounter medications on file as of 05/13/2017.     Allergy:  Allergies  Allergen Reactions  . Oxycodone Other (See Comments)    Intense H/A  . Celebrex [Celecoxib] Other (See Comments)    GI upset   . Doxycycline Nausea Only    Gi upset Pt is able to tolerate with food  . Iron Other (See Comments)    Pt cannot tolerate some iron preparations. The upset her stomach.  . Lodine [Etodolac] Nausea Only    Gi upset  . Nabumetone Other (See Comments)    (relafen) unknown  . Tetracycline Nausea  And Vomiting  . Xarelto [Rivaroxaban] Hives    Social Hx:   Social History   Socioeconomic History  . Marital status: Married    Spouse name: Not on file  . Number of children: 2  . Years of education: Not on file  . Highest education level: Not on file  Occupational History  . Not on file  Social Needs  . Financial resource strain: Not on file  . Food insecurity:    Worry: Not on file    Inability: Not on file  . Transportation needs:    Medical: Not on file    Non-medical: Not on file  Tobacco Use  . Smoking status: Never Smoker  . Smokeless tobacco: Never Used  Substance and Sexual Activity  . Alcohol use: No    Alcohol/week: 0.0 oz  . Drug use: No  . Sexual activity: Not on file  Lifestyle  .  Physical activity:    Days per week: Not on file    Minutes per session: Not on file  . Stress: Not on file  Relationships  . Social connections:    Talks on phone: Not on file    Gets together: Not on file    Attends religious service: Not on file    Active member of club or organization: Not on file    Attends meetings of clubs or organizations: Not on file    Relationship status: Not on file  . Intimate partner violence:    Fear of current or ex partner: Not on file    Emotionally abused: Not on file    Physically abused: Not on file    Forced sexual activity: Not on file  Other Topics Concern  . Not on file  Social History Narrative  . Not on file    Past Surgical Hx:  Past Surgical History:  Procedure Laterality Date  . ABDOMINAL HYSTERECTOMY     complete  . BACK SURGERY     upper  with plate frontal fusion and lower x 2  . COLONOSCOPY    . COLONOSCOPY N/A 05/05/2016   Procedure: COLONOSCOPY;  Surgeon: Daneil Dolin, MD;  Location: [facility];  Service: Endoscopy;  Laterality: N/A;  . ESOPHAGOGASTRODUODENOSCOPY N/A 05/04/2016   Procedure: ESOPHAGOGASTRODUODENOSCOPY (EGD);  Surgeon: Daneil Dolin, MD;  Location: [facility];  Service: Endoscopy;  Laterality: N/A;  . ESOPHAGOGASTRODUODENOSCOPY N/A 04/21/2017   Procedure: ESOPHAGOGASTRODUODENOSCOPY (EGD);  Surgeon: Ladene Artist, MD;  Location: [facility];  Service: Endoscopy;  Laterality: N/A;  . GIVENS CAPSULE STUDY N/A 05/06/2016   Procedure: GIVENS CAPSULE STUDY;  Surgeon: Daneil Dolin, MD;  Location: [facility];  Service: Endoscopy;  Laterality: N/A;  . KNEE SURGERY Left    arthroscopy  . RADICAL VULVECTOMY Left 04/09/2016   Procedure: RADICAL LEFT VULVECTOMY;  Surgeon: Everitt Amber, MD;  Location: [facility];  Service: Gynecology;  Laterality: Left;  . RADICAL VULVECTOMY  10/17/2016   Procedure: RADICAL ANTERIOR VULVECTOMY;  Surgeon: Everitt Amber, MD;  Location: [facility];  Service: Gynecology;;  . TOE SURGERY  Left   . TONSILLECTOMY    . TOTAL HIP ARTHROPLASTY Left 10/27/2013   Procedure: LEFT TOTAL HIP ARTHROPLASTY;  Surgeon: Tobi Bastos, MD;  Location: [facility];  Service: Orthopedics;  Laterality: Left;  . TOTAL KNEE ARTHROPLASTY Right   . VULVA /PERINEUM BIOPSY  02/22/2016  . VULVECTOMY Right 02/27/2016   Procedure: RADICAL RIGHT VULVECTOMY WITH RIGHT INGUINAL LYMPH NODE DISSECTION;  Surgeon: Nancy Marus,  MD;  Location: [facility];  Service: Gynecology;  Laterality: Right;    Past Medical Hx:  Past Medical History:  Diagnosis Date  . Aortic stenosis, mild   . Aortic valve stenosis, mild   . Arthritis   . Complication of anesthesia   . Diabetes mellitus without complication ([place])    type 2  . Heart murmur   . History of blood transfusion age 13 or 76 months old  . History of radiation therapy 06/05/16 - 07/25/16   Pelvis treated to 45 Gy in 25 fractions, then Boosted an additional 18 Gy in 9 fractions.  . Hypertension   . IFG (impaired fasting glucose)   . PONV (postoperative nausea and vomiting)    years ago with back surgery-nausea  . Vitamin D deficiency   . Vulvar ca dx'd 01/2016    Past Gynecological History:  Remote hx of hysterectomy for fibroids. No hx of abnromal paps (last pap remote) No LMP recorded. Patient has had a hysterectomy.  Family Hx:  Family History  Problem Relation Age of Onset  . Hypertension Mother   . Hypertension Father   . Throat cancer Father   . Cancer Sister        Breast  . Hypertension Brother     Review of Systems:  Constitutional  Feels well,    ENT Normal appearing ears and nares bilaterally Skin/Breast  + vulvar pruritis. Cardiovascular  No chest pain, shortness of breath, or edema  Pulmonary  No cough or wheeze.  Gastro Intestinal  No nausea, vomitting, or diarrhoea. No bright red blood per rectum, no abdominal pain, change in bowel movement, or constipation.  Genito Urinary  No frequency, urgency, dysuria, no bleeding Musculo  Skeletal  No myalgia, arthralgia, joint swelling or pain  Neurologic  No weakness, numbness, change in gait,  Psychology  No depression, anxiety, insomnia.   Vitals:  Blood pressure 111/60, pulse 89, temperature 98.3 F (36.8 C), temperature source Oral, resp. rate 20, height 5\' 2"  (1.575 m), weight 192 lb 8 oz (87.3 kg), SpO2 99 %.  Physical Exam: WD in NAD Neck  Supple NROM, without any enlargements.  Lymph Node Survey No cervical supraclavicular or inguinal adenopathy. Right groin incision well healed.  Cardiovascular  Pulse normal rate, regularity and rhythm. S1 and S2 normal.  Lungs  Clear to auscultation bilateraly, without wheezes/crackles/rhonchi. Good air movement.  Skin  No rash/lesions/breakdown  Psychiatry  Alert and oriented to person, place, and time  Abdomen  Normoactive bowel sounds, abdomen soft, non-tender and obese without evidence of hernia.  Back No CVA tenderness Genito Urinary  Vulva/vagina: Radiation changes present. Edematous mons pubis. Anterior mons incision in tact, persistent wound separation of peri-urethral incision with somewhat necrotic base.  There is now evidence of cellulitis surrouding right anterior mons pubis biopsy site, no evidence of recurrent tumor, though there is some induration of the surrounding skin edge. Spreading erythema on surrounding skin. The area of hyperaesthesia on the right groin crease is free of lesions or masses.   There is no palpable or visible periurethral tumor.   Bladder/urethra:  No lesions or masses, well supported bladder  Vagina: atrophic, no lesions  Cervix: surgically absent  Uterus: surgically absen  Adnexa: no palpable masses. Rectal  deferred Extremities  No bilateral cyanosis, clubbing or edema.    Thereasa Solo, MD  05/13/2017, 10:13 AM

## 2017-05-15 ENCOUNTER — Encounter (HOSPITAL_BASED_OUTPATIENT_CLINIC_OR_DEPARTMENT_OTHER): Payer: Self-pay

## 2017-05-15 ENCOUNTER — Telehealth: Payer: Self-pay | Admitting: Oncology

## 2017-05-15 ENCOUNTER — Encounter (HOSPITAL_BASED_OUTPATIENT_CLINIC_OR_DEPARTMENT_OTHER)
Admission: RE | Disposition: A | Discharge status: Home / Self Care | Payer: Self-pay | Source: Ambulatory Visit | Attending: Gynecologic Oncology

## 2017-05-15 ENCOUNTER — Ambulatory Visit (HOSPITAL_BASED_OUTPATIENT_CLINIC_OR_DEPARTMENT_OTHER): Payer: PPO | Admitting: Anesthesiology

## 2017-05-15 ENCOUNTER — Ambulatory Visit (HOSPITAL_BASED_OUTPATIENT_CLINIC_OR_DEPARTMENT_OTHER)
Admission: RE | Admit: 2017-05-15 | Discharge: 2017-05-16 | Disposition: A | Discharge status: Home / Self Care | Payer: PPO | Source: Ambulatory Visit | Attending: Gynecologic Oncology | Admitting: Gynecologic Oncology

## 2017-05-15 DIAGNOSIS — Z96642 Presence of left artificial hip joint: Secondary | ICD-10-CM | POA: Diagnosis not present

## 2017-05-15 DIAGNOSIS — C519 Malignant neoplasm of vulva, unspecified: Secondary | ICD-10-CM | POA: Diagnosis present

## 2017-05-15 DIAGNOSIS — I350 Nonrheumatic aortic (valve) stenosis: Secondary | ICD-10-CM | POA: Diagnosis not present

## 2017-05-15 DIAGNOSIS — I10 Essential (primary) hypertension: Secondary | ICD-10-CM | POA: Diagnosis not present

## 2017-05-15 DIAGNOSIS — Z7984 Long term (current) use of oral hypoglycemic drugs: Secondary | ICD-10-CM | POA: Insufficient documentation

## 2017-05-15 DIAGNOSIS — N9089 Other specified noninflammatory disorders of vulva and perineum: Secondary | ICD-10-CM | POA: Diagnosis not present

## 2017-05-15 DIAGNOSIS — D509 Iron deficiency anemia, unspecified: Secondary | ICD-10-CM | POA: Diagnosis not present

## 2017-05-15 DIAGNOSIS — Z79899 Other long term (current) drug therapy: Secondary | ICD-10-CM | POA: Insufficient documentation

## 2017-05-15 DIAGNOSIS — Z923 Personal history of irradiation: Secondary | ICD-10-CM | POA: Diagnosis not present

## 2017-05-15 DIAGNOSIS — N764 Abscess of vulva: Secondary | ICD-10-CM | POA: Diagnosis not present

## 2017-05-15 DIAGNOSIS — C510 Malignant neoplasm of labium majus: Secondary | ICD-10-CM | POA: Diagnosis not present

## 2017-05-15 DIAGNOSIS — Z7982 Long term (current) use of aspirin: Secondary | ICD-10-CM | POA: Insufficient documentation

## 2017-05-15 DIAGNOSIS — Z96651 Presence of right artificial knee joint: Secondary | ICD-10-CM | POA: Insufficient documentation

## 2017-05-15 DIAGNOSIS — E119 Type 2 diabetes mellitus without complications: Secondary | ICD-10-CM | POA: Insufficient documentation

## 2017-05-15 DIAGNOSIS — E785 Hyperlipidemia, unspecified: Secondary | ICD-10-CM | POA: Diagnosis not present

## 2017-05-15 HISTORY — DX: Personal history of peptic ulcer disease: Z87.11

## 2017-05-15 HISTORY — PX: VULVA /PERINEUM BIOPSY: SHX319

## 2017-05-15 HISTORY — DX: Personal history of other diseases of the digestive system: Z87.19

## 2017-05-15 HISTORY — DX: Unspecified urinary incontinence: R32

## 2017-05-15 HISTORY — DX: Neuralgia and neuritis, unspecified: M79.2

## 2017-05-15 HISTORY — DX: Type 2 diabetes mellitus without complications: E11.9

## 2017-05-15 HISTORY — DX: Intrinsic sphincter deficiency (ISD): N36.42

## 2017-05-15 HISTORY — DX: Other nonspecific abnormal finding of lung field: R91.8

## 2017-05-15 HISTORY — DX: Iron deficiency anemia, unspecified: D50.9

## 2017-05-15 HISTORY — DX: Malignant neoplasm of vulva, unspecified: C51.9

## 2017-05-15 HISTORY — DX: Personal history of other diseases of the circulatory system: Z86.79

## 2017-05-15 HISTORY — PX: DEBRIDEMENT AND CLOSURE WOUND: SHX5614

## 2017-05-15 LAB — GLUCOSE, CAPILLARY
Glucose-Capillary: 108 mg/dL — ABNORMAL HIGH (ref 65–99)
Glucose-Capillary: 112 mg/dL — ABNORMAL HIGH (ref 65–99)
Glucose-Capillary: 143 mg/dL — ABNORMAL HIGH (ref 65–99)

## 2017-05-15 SURGERY — BIOPSY, VULVA
Anesthesia: Monitor Anesthesia Care | Site: Perineum

## 2017-05-15 MED ORDER — PROPOFOL 10 MG/ML IV BOLUS
INTRAVENOUS | Status: AC
  Filled 2017-05-15: qty 20

## 2017-05-15 MED ORDER — FENTANYL CITRATE (PF) 100 MCG/2ML IJ SOLN
INTRAMUSCULAR | Status: AC
  Filled 2017-05-15: qty 2

## 2017-05-15 MED ORDER — ONDANSETRON HCL 4 MG/2ML IJ SOLN
4.0000 mg | Freq: Four times a day (QID) | INTRAMUSCULAR | Status: DC | PRN
  Filled 2017-05-15: qty 2

## 2017-05-15 MED ORDER — BUPIVACAINE LIPOSOME 1.3 % IJ SUSP
INTRAMUSCULAR | Status: DC | PRN
  Administered 2017-05-15: 20 mL

## 2017-05-15 MED ORDER — ACETAMINOPHEN 325 MG PO TABS
650.0000 mg | ORAL_TABLET | ORAL | Status: DC | PRN
  Filled 2017-05-15: qty 2

## 2017-05-15 MED ORDER — MEPERIDINE HCL 25 MG/ML IJ SOLN
6.2500 mg | INTRAMUSCULAR | Status: DC | PRN
  Filled 2017-05-15: qty 1

## 2017-05-15 MED ORDER — FENTANYL CITRATE (PF) 100 MCG/2ML IJ SOLN
25.0000 ug | INTRAMUSCULAR | Status: DC | PRN
  Administered 2017-05-15 (×2): 25 ug via INTRAVENOUS
  Filled 2017-05-15: qty 1

## 2017-05-15 MED ORDER — KETOROLAC TROMETHAMINE 30 MG/ML IJ SOLN
INTRAMUSCULAR | Status: AC
  Filled 2017-05-15: qty 1

## 2017-05-15 MED ORDER — HYDROMORPHONE HCL 1 MG/ML IJ SOLN
0.2000 mg | INTRAMUSCULAR | Status: DC | PRN
  Filled 2017-05-15: qty 1

## 2017-05-15 MED ORDER — ONDANSETRON HCL 4 MG/2ML IJ SOLN
INTRAMUSCULAR | Status: AC
  Filled 2017-05-15: qty 2

## 2017-05-15 MED ORDER — ACETIC ACID 5 % SOLN
Status: DC | PRN
  Administered 2017-05-15: 1 via TOPICAL

## 2017-05-15 MED ORDER — KETOROLAC TROMETHAMINE 30 MG/ML IJ SOLN
15.0000 mg | Freq: Three times a day (TID) | INTRAMUSCULAR | Status: AC
  Administered 2017-05-15 – 2017-05-16 (×3): 15 mg via INTRAVENOUS
  Filled 2017-05-15: qty 1

## 2017-05-15 MED ORDER — OXYCODONE-ACETAMINOPHEN 10-325 MG PO TABS: 1.0000 | ORAL_TABLET | ORAL | 0 refills | Status: DC | PRN

## 2017-05-15 MED ORDER — ONDANSETRON HCL 4 MG PO TABS
4.0000 mg | ORAL_TABLET | Freq: Four times a day (QID) | ORAL | Status: DC | PRN
  Filled 2017-05-15: qty 1

## 2017-05-15 MED ORDER — SENNA 8.6 MG PO TABS
1.0000 | ORAL_TABLET | Freq: Every day | ORAL | Status: DC
  Administered 2017-05-15: 8.6 mg via ORAL
  Filled 2017-05-15 (×2): qty 1

## 2017-05-15 MED ORDER — PROPOFOL 10 MG/ML IV BOLUS
INTRAVENOUS | Status: DC | PRN
  Administered 2017-05-15: 150 mg via INTRAVENOUS

## 2017-05-15 MED ORDER — LIDOCAINE 2% (20 MG/ML) 5 ML SYRINGE
INTRAMUSCULAR | Status: DC | PRN
  Administered 2017-05-15: 100 mg via INTRAVENOUS

## 2017-05-15 MED ORDER — LACTATED RINGERS IV SOLN
INTRAVENOUS | Status: DC
  Administered 2017-05-15: 10:00:00 via INTRAVENOUS
  Filled 2017-05-15: qty 1000

## 2017-05-15 MED ORDER — HYDROCODONE-ACETAMINOPHEN 5-325 MG PO TABS
1.0000 | ORAL_TABLET | ORAL | Status: DC | PRN
  Administered 2017-05-16: 1 via ORAL
  Filled 2017-05-15: qty 2

## 2017-05-15 MED ORDER — DEXAMETHASONE SODIUM PHOSPHATE 10 MG/ML IJ SOLN
INTRAMUSCULAR | Status: AC
  Filled 2017-05-15: qty 1

## 2017-05-15 MED ORDER — ONDANSETRON HCL 4 MG/2ML IJ SOLN
INTRAMUSCULAR | Status: DC | PRN
  Administered 2017-05-15: 4 mg via INTRAVENOUS

## 2017-05-15 MED ORDER — FENTANYL CITRATE (PF) 100 MCG/2ML IJ SOLN
INTRAMUSCULAR | Status: DC | PRN
  Administered 2017-05-15 (×3): 50 ug via INTRAVENOUS

## 2017-05-15 MED ORDER — AMOXICILLIN-POT CLAVULANATE 875-125 MG PO TABS
1.0000 | ORAL_TABLET | Freq: Two times a day (BID) | ORAL | Status: DC
  Administered 2017-05-15: 1 via ORAL
  Filled 2017-05-15 (×2): qty 1

## 2017-05-15 MED ORDER — DEXAMETHASONE SODIUM PHOSPHATE 10 MG/ML IJ SOLN
INTRAMUSCULAR | Status: DC | PRN
  Administered 2017-05-15: 10 mg via INTRAVENOUS

## 2017-05-15 MED ORDER — MIDAZOLAM HCL 2 MG/2ML IJ SOLN
INTRAMUSCULAR | Status: AC
  Filled 2017-05-15: qty 2

## 2017-05-15 SURGICAL SUPPLY — 40 items
APPLICATOR COTTON TIP 6IN STRL (MISCELLANEOUS) IMPLANT
BLADE CLIPPER SURG (BLADE) IMPLANT
BLADE EXTENDED COATED 6.5IN (ELECTRODE) IMPLANT
BLADE SURG 11 STRL SS (BLADE) ×2 IMPLANT
BLADE SURG 15 STRL LF DISP TIS (BLADE) ×1 IMPLANT
BLADE SURG 15 STRL SS (BLADE) ×1
CANISTER SUCT 3000ML PPV (MISCELLANEOUS) ×2 IMPLANT
CATH FOLEY 2WAY SLVR  5CC 14FR (CATHETERS)
CATH FOLEY 2WAY SLVR 5CC 14FR (CATHETERS) IMPLANT
CATH ROBINSON RED A/P 14FR (CATHETERS) ×2 IMPLANT
CLOTH BEACON ORANGE TIMEOUT ST (SAFETY) ×2 IMPLANT
COUNTER NEEDLE 1200 MAGNETIC (NEEDLE) ×2 IMPLANT
GAUZE PACKING IODOFORM 2 (PACKING) ×2 IMPLANT
GAUZE SPONGE 4X4 12PLY STRL (GAUZE/BANDAGES/DRESSINGS) IMPLANT
GAUZE SPONGE 4X4 16PLY XRAY LF (GAUZE/BANDAGES/DRESSINGS) IMPLANT
GLOVE BIO SURGEON STRL SZ 6 (GLOVE) ×4 IMPLANT
KIT TURNOVER CYSTO (KITS) ×2 IMPLANT
NEEDLE HYPO 25X1 1.5 SAFETY (NEEDLE) ×2 IMPLANT
NS IRRIG 500ML POUR BTL (IV SOLUTION) ×2 IMPLANT
PACK COOL COMFORT PERI COLD (SET/KITS/TRAYS/PACK) ×2 IMPLANT
PACK VAGINAL WOMENS (CUSTOM PROCEDURE TRAY) ×2 IMPLANT
PAD OB MATERNITY 4.3X12.25 (PERSONAL CARE ITEMS) IMPLANT
SUT VIC AB 0 SH 27 (SUTURE) ×2 IMPLANT
SUT VIC AB 2-0 CT2 27 (SUTURE) IMPLANT
SUT VIC AB 2-0 SH 27 (SUTURE)
SUT VIC AB 2-0 SH 27XBRD (SUTURE) IMPLANT
SUT VIC AB 3-0 PS2 18 (SUTURE)
SUT VIC AB 3-0 PS2 18XBRD (SUTURE) IMPLANT
SUT VIC AB 3-0 SH 27 (SUTURE) ×1
SUT VIC AB 3-0 SH 27X BRD (SUTURE) ×1 IMPLANT
SUT VICRYL 2 0 18  UND BR (SUTURE)
SUT VICRYL 2 0 18 UND BR (SUTURE) IMPLANT
SUT VICRYL 4-0 PS2 18IN ABS (SUTURE) ×2 IMPLANT
SWAB OB GYN 8IN STERILE 2PK (MISCELLANEOUS) IMPLANT
SYR BULB IRRIGATION 50ML (SYRINGE) ×2 IMPLANT
TOWEL OR 17X24 6PK STRL BLUE (TOWEL DISPOSABLE) ×4 IMPLANT
TRAY DSU PREP LF (CUSTOM PROCEDURE TRAY) ×2 IMPLANT
UNDERPAD 30X30 (UNDERPADS AND DIAPERS) ×2 IMPLANT
VACUUM HOSE/TUBING 7/8INX6FT (MISCELLANEOUS) IMPLANT
WATER STERILE IRR 500ML POUR (IV SOLUTION) ×2 IMPLANT

## 2017-05-15 NOTE — Progress Notes (Signed)
Informed patient of my suspicion for recurrent vulvar cancer. I recommend BID dressing changes of vulva with iodoform gauze or regular gauze.   Will have her seen by my partner to discuss possible resection with flap.  Discharge to home on augmentin tomorrow.  Thereasa Solo, MD

## 2017-05-15 NOTE — Anesthesia Procedure Notes (Signed)
Procedure Name: LMA Insertion Date/Time: 05/15/2017 10:27 AM Performed by: Gwyndolyn Saxon, CRNA Pre-anesthesia Checklist: Patient identified, Emergency Drugs available, Suction available, Patient being monitored and Timeout performed Patient Re-evaluated:Patient Re-evaluated prior to induction Oxygen Delivery Method: Circle system utilized Preoxygenation: Pre-oxygenation with 100% oxygen Induction Type: IV induction Ventilation: Mask ventilation without difficulty LMA: LMA inserted LMA Size: 4.0 Number of attempts: 1 Placement Confirmation: positive ETCO2,  CO2 detector and breath sounds checked- equal and bilateral Tube secured with: Tape Dental Injury: Teeth and Oropharynx as per pre-operative assessment

## 2017-05-15 NOTE — Op Note (Signed)
PATIENT: [name] DATE: 05/15/17   Preop Diagnosis: history of vulvar cancer, cavitating mass on anterior vulva concerning for abscess vs recurrence  Postoperative Diagnosis: same  Surgery: debridement of anterior vulvar abscess, vulvar biopsies.   Surgeons:  Donaciano Eva, MD Assistant: none  Anesthesia: General   Estimated blood loss: 20 ml  IVF:  300 ml   Urine output: 75 ml   Complications: None   Pathology: right posterior labia majora, anterior mons pubis, left mid labia majora  Operative findings: 6cm cavitating anterior mons pubis indurated lesion. It is approximately 3.5cm from the urethral meatus to inferior aspect of mass. Two indurated 1-2cm nodular areas (one on the posterior right labia majora and one on the mid left labia majora). No periurethral lesions.  Procedure: The patient was identified in the preoperative holding area. Informed consent was signed on the chart. Patient was seen history was reviewed and exam was performed.   The patient was then taken to the operating room and placed in the supine position with SCD hose on. General anesthesia was then induced without difficulty. She was then placed in the dorsolithotomy position. The perineum was prepped with Betadine. The vagina was prepped with Betadine. The patient was then draped after the prep was dried. A Foley catheter was inserted into the bladder under sterile conditions and then removed.  Timeout was performed the patient, procedure, antibiotic, allergy, and length of procedure.    A scalpel was used to excise biopsies from the right posterior and left mid labia majora. The anterior vulvar lesion was opened with the scalpel and sharply debrided the base of the mass/cavity. The exam was concerning for tumor rind at the borders of the cavity. Tissue from the walls were sent for pathology. Hemostasis was achieved at the base of the cavity. It was packed with iodoform.   All  instrument, suture, laparotomy, Ray-Tec, and needle counts were correct x2. The patient tolerated the procedure well and was taken recovery room in stable condition. This is [name] dictating an operative note on [name].  Thereasa Solo, MD

## 2017-05-15 NOTE — Telephone Encounter (Signed)
[facility] Coordinator, Melene Muller. Advised him that patient is an inpatient at the [facility] and that a referral has been placed for home health for BID dressing changes with iodoform packing.  He said he will set up home care today.

## 2017-05-15 NOTE — Transfer of Care (Signed)
Immediate Anesthesia Transfer of Care Note  Patient: [name]  Procedure(s) Performed: VULVAR BIOPSY (N/A Perineum) EXAM UNDER ANESTHESIA (N/A Perineum) DEBRIDEMENT VULVA  WOUND (N/A Perineum)  Patient Location: PACU  Anesthesia Type:General  Level of Consciousness: sedated and patient cooperative  Airway & Oxygen Therapy: Patient Spontanous Breathing and Patient connected to nasal cannula oxygen  Post-op Assessment: Report given to RN and Post -op Vital signs reviewed and stable  Post vital signs: Reviewed and stable  Last Vitals:  Vitals Value Taken Time  BP 135/74 05/15/2017 12:00 PM  Temp    Pulse 80 05/15/2017 12:09 PM  Resp 13 05/15/2017 12:09 PM  SpO2 92 % 05/15/2017 12:09 PM  Vitals shown include unvalidated device data.  Last Pain:  Vitals:   05/15/17 1200  TempSrc:   PainSc: 1       Patients Stated Pain Goal: 4 (89/37/34 2876)  Complications: No apparent anesthesia complications

## 2017-05-15 NOTE — Anesthesia Preprocedure Evaluation (Addendum)
Anesthesia Evaluation  Patient identified by MRN, date of birth, ID band Patient awake    Reviewed: Allergy & Precautions, H&P , NPO status , Patient's Chart, lab work & pertinent test results  History of Anesthesia Complications (+) PONV and history of anesthetic complications  Airway Mallampati: III   Neck ROM: full  Mouth opening: Limited Mouth Opening  Dental   Pulmonary neg pulmonary ROS,    breath sounds clear to auscultation       Cardiovascular hypertension, Normal cardiovascular exam+ Valvular Problems/Murmurs AS  Rhythm:regular Rate:Normal  Mild AS   Neuro/Psych    GI/Hepatic   Endo/Other  diabetes, Type 2  Renal/GU      Musculoskeletal  (+) Arthritis ,   Abdominal   Peds  Hematology  (+) Blood dyscrasia, anemia ,   Anesthesia Other Findings   Reproductive/Obstetrics                           Anesthesia Physical  Anesthesia Plan  ASA: III  Anesthesia Plan: MAC   Post-op Pain Management:    Induction: Intravenous  PONV Risk Score and Plan: 3 and Propofol infusion and Treatment may vary due to age or medical condition  Airway Management Planned: Nasal Cannula and Natural Airway  Additional Equipment:   Intra-op Plan:   Post-operative Plan:   Informed Consent: I have reviewed the patients History and Physical, chart, labs and discussed the procedure including the risks, benefits and alternatives for the proposed anesthesia with the patient or authorized representative who has indicated his/her understanding and acceptance.     Plan Discussed with: CRNA, Anesthesiologist and Surgeon  Anesthesia Plan Comments:         Anesthesia Quick Evaluation

## 2017-05-15 NOTE — H&P (Signed)
Consult Note: Gyn-Onc  Consult was requested by Dr. Elonda Husky for the evaluation of [name] 77 y.o. female   CC:  No chief complaint on file.   Assessment/Plan:  [name]  is a 77 y.o.  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 S/p adjuvant vulvar and inguinal (bilateral) radiation. Close urethral margin on recurrent carcinoma specimen.  Anterior mons caruncle appears necrotic with cellulitis. Recommend augmentin. Have scheduled for debridement in 48 hours. Counseled that she will require to pack the area postop. We will biopsy for possible occult cancer at the base.  Neuropathic pain in right groin - already on Neurontin -recommended to continue 3 times per day.  Urinary incontinence (likely from urethral sphincter weakness). Recommend eval by Dr Matilde Sprang. Patient prefers to hold off while recovering from GI bleeding.   HPI: [name] is a 77 year old woman who is seen in consultation at the request of Dr Elonda Husky for a right labial lesion.    The patient reports that she began feeling pruritis of the vulva in October, 2017. She was seen by her PCP who saw no lesion and prescribed estradiol.  The pruritis persisted and became worse and she began to feel a nodule in the area and therefore again saw her PCP followed by a referral to gynecologist, Dr Elonda Husky on 02/20/16. The nodular lesion was appreciated on the right vulva. No biopsy was taken at that time. Biopsy was performed on 02/22/16 and pathology from biopsy confirmed invasive SCC.   CT scan abdo/pelvis was negative for evidence of suspicious nodes or distant mets.  On 02/22/16 she underwent right radical vulvectomy and right inguinal lymphadenectomy with Dr Nancy Marus. Final pathology revealed: a 2.1cm moderately differentiated SCC of the right vulva. Margins were negative (closest was 3 o'clock, 70mm). Depth of invasion was 79mm. 1 of 6  inguinofemoral lymph nodes (right) was positive for metastatic disease.   CT chest on 04/01/16 showed: Multiple pulmonary nodules as detailed above, generally in the 18mm and less average diameter range although with 1 right lower lobe subpleural nodule measuring about 8 mm in diameter. These likely warrant surveillance. Hypodense right thyroid nodule, 1.1 cm in long axis. Consider further evaluation with thyroid ultrasound. If patient is clinically hyperthyroid, consider nuclear medicine thyroid uptake and scan. Stable hypodense lateral splenic lesion, likely a cyst.  Postoperatively after her first vulvectomy she developed irritation on the left vulvar lip. She saw [place] for evaluation in March and complained of this irritation. A nodular lesion was seen in the mid portion of the left labia minora in a "kissing" location to where the primary lesion had been. It was biopsied and confirmed as recurrent SCC of the vulva.  On 04/09/16 she was again taken to the OR, this time for a radical left vulvectomy. A decision was made to not perform left inguinal lymphadenectomy, so as to not delay radiation further (with an additional groin wound to heal), but to instead extend the radiation to both groins.  Pathology from this surgery confirmed a poorly differentiated squamous cell carcinoma of the left vulva with 23mm of invasion. The inferior 6 o'clock margin was "broadly" positive (though macroscopically this margin was grossly negative.  She was recommended to receive adjuvant radiation to the vulva and groins. Radiation treatment dates of 06/05/16-07/25/16 where she received 45Gy in 33fractions directed at the bilateral inguinal and vulvar area. The patient then proceeded with a  boost to the vulvar region the light of the positive surgical margin and continue to a dose of 63 gray.  She developed burning and itching on the anterior right vulva in August, 2018 and a 2cm lesion was seen on the anterior vulva  approximating the urethral meatus. It was biopsied in September, 2018 and this confirmed recurrent SCC.  On 10/17/16 she underwent radical anterior vulvectomy. A 3cm raised vulvar lesion was seen at the midline and right vulva extending to within 0.5cm (macroscopically) to the urethral meatus.  Final pathology confirmed SCC, 2.7cm with 49mm invasion, negative magins though there was a very close (64mm) urethral margin.  Repeat Chest CT in September, 2018 showed stable benign appearing nodules.  She saw Dr Sondra Come in December, 2018 and there was concern for possible recurrence at the periurethral incision area due to necrosis.  She was being followed for a 1cm mass at the mons pubis incision.  She has noted this area getting larger and with burning sensation when it touches the fabric of her clothes.   A 2.5cm fluctuant area was noted to the right of the midline mons incision. It was biopsied on 02/17/17 with pathology showing no recurrence only inflammatory changes consistent with an abscess.  She had some initial mild relief to the office I&D, but subseqently developed increasing pain again. She denies fevers. She has separate right inguinal crease neuralgia (burning pain). Minimal relief from gabapentin BID.   Interval Hx:  She was discharged from hospital on 04/22/17 for a GI bleed after receiving transfusion. No imaging performed. She had a GI bleed in April, 2018 with a colonoscopy and swallow study which showed no source.  Continued groin burning.   We ordered a CT scan on 05/01/17 to evaluate for occult source of GI bleeding. It demonstrated a 4+cm area of gas filled tissue on the right anterior mons consistent with either abscess vs recurrent necrotic tumor.  Current Meds:  Outpatient Encounter Medications as of 05/13/2017  Medication Sig  . aspirin 81 MG tablet Take 81 mg by mouth every evening.   . ferrous sulfate 325 (65 FE) MG tablet Take 1 tablet (325 mg total) by mouth daily with  breakfast.  . furosemide (LASIX) 40 MG tablet TAKE (1) TABLET BY MOUTH ONCE DAILY.  Marland Kitchen gabapentin (NEURONTIN) 300 MG capsule Take 1 capsule (300 mg total) by mouth 3 (three) times daily.  Marland Kitchen HYDROcodone-acetaminophen (NORCO) 5-325 MG tablet One tablet Q 4 hours prn. Caution drowsiness.  Marland Kitchen losartan (COZAAR) 50 MG tablet TAKE ONE TABLET BY MOUTH ONCE DAILY.  . metFORMIN (GLUCOPHAGE) 500 MG tablet TAKE (1) TABLET BY MOUTH TWICE DAILY.  Marland Kitchen potassium chloride SA (K-DUR,KLOR-CON) 20 MEQ tablet TAKE 1 TABLET BY MOUTH ONCE A DAY.  Marland Kitchen amoxicillin-clavulanate (AUGMENTIN) 875-125 MG tablet Take 1 tablet by mouth 2 (two) times daily.   No facility-administered encounter medications on file as of 05/13/2017.     Allergy:  Allergies  Allergen Reactions  . Oxycodone Other (See Comments)    Intense H/A  . Celebrex [Celecoxib] Other (See Comments)    GI upset   . Doxycycline Nausea Only    Gi upset Pt is able to tolerate with food  . Iron Other (See Comments)    Pt cannot tolerate some iron preparations. The upset her stomach.  . Lodine [Etodolac] Nausea Only    Gi upset  . Nabumetone Other (See Comments)    (relafen)  Gi upset  . Tetracycline Nausea And Vomiting  . Xarelto [  Rivaroxaban] Hives    Social Hx:   Social History   Socioeconomic History  . Marital status: Married    Spouse name: Not on file  . Number of children: 2  . Years of education: Not on file  . Highest education level: Not on file  Occupational History  . Not on file  Social Needs  . Financial resource strain: Not on file  . Food insecurity:    Worry: Not on file    Inability: Not on file  . Transportation needs:    Medical: Not on file    Non-medical: Not on file  Tobacco Use  . Smoking status: Never Smoker  . Smokeless tobacco: Never Used  Substance and Sexual Activity  . Alcohol use: No    Alcohol/week: 0.0 oz  . Drug use: No  . Sexual activity: Not on file  Lifestyle  . Physical activity:    Days per  week: Not on file    Minutes per session: Not on file  . Stress: Not on file  Relationships  . Social connections:    Talks on phone: Not on file    Gets together: Not on file    Attends religious service: Not on file    Active member of club or organization: Not on file    Attends meetings of clubs or organizations: Not on file    Relationship status: Not on file  . Intimate partner violence:    Fear of current or ex partner: Not on file    Emotionally abused: Not on file    Physically abused: Not on file    Forced sexual activity: Not on file  Other Topics Concern  . Not on file  Social History Narrative  . Not on file    Past Surgical Hx:  Past Surgical History:  Procedure Laterality Date  . ABDOMINAL HYSTERECTOMY  1979   W/ BILATERAL SALPINGOOPHORECTOMY  . ANTERIOR CERVICAL DECOMP/DISCECTOMY FUSION  11-09-2002   dr Hal Neer  [facility]   C3--6  . CATARACT EXTRACTION W/ INTRAOCULAR LENS  IMPLANT, BILATERAL  yrs ago  . COLONOSCOPY N/A 05/05/2016   Procedure: COLONOSCOPY;  Surgeon: Daneil Dolin, MD;  [facility];  Service: Endoscopy;  Laterality: N/A;  . ESOPHAGOGASTRODUODENOSCOPY N/A 05/04/2016   Procedure: ESOPHAGOGASTRODUODENOSCOPY (EGD);  Surgeon: Daneil Dolin, MD;  Location: [facility];  Service: Endoscopy;  Laterality: N/A;  . ESOPHAGOGASTRODUODENOSCOPY N/A 04/21/2017   Procedure: ESOPHAGOGASTRODUODENOSCOPY (EGD);  Surgeon: Ladene Artist, MD;  [facility]: [facility];  Service: Endoscopy;  Laterality: N/A;  . GIVENS CAPSULE STUDY N/A 05/06/2016   Procedure: GIVENS CAPSULE STUDY;  Surgeon: Daneil Dolin, MD;  Location: [facility];  Service: Endoscopy;  Laterality: N/A;  . KNEE ARTHROSCOPY Left 1980s;   05-18-2002   dr Rhona Raider  [facility]  . LUMBAR SPINE SURGERY  1991    dr Tonita Cong  . RADICAL VULVECTOMY Left 04/09/2016   Procedure: RADICAL LEFT VULVECTOMY;  Surgeon: Everitt Amber, MD;  Location: [facility];  Service: Gynecology;  Laterality: Left;  . RADICAL VULVECTOMY   10/17/2016   Procedure: RADICAL ANTERIOR VULVECTOMY;  Surgeon: Everitt Amber, MD;  Location: [facility];  Service: Gynecology;;  . TOE SURGERY Left   . TONSILLECTOMY  1967  . TOTAL HIP ARTHROPLASTY Left 10/27/2013   Procedure: LEFT TOTAL HIP ARTHROPLASTY;  Surgeon: Tobi Bastos, MD;  Location: [facility];  Service: Orthopedics;  Laterality: Left;  . TOTAL KNEE ARTHROPLASTY Right 03-28-2010    dr Gladstone Lighter  [facility]  . TRANSTHORACIC ECHOCARDIOGRAM  10/31/2014   mild focal basal hypertrophy of the septum, ef 24-40%, grade 1 diastolic dysfunction/  moderate AV stenosis, valve area 1.46cm^2/  trivial MR/  mild PR and TR/  mild RAE/ PASP 79mmHg  . VULVA /PERINEUM BIOPSY  02/22/2016  . VULVECTOMY Right 02/27/2016   Procedure: RADICAL RIGHT VULVECTOMY WITH RIGHT INGUINAL LYMPH NODE DISSECTION;  Surgeon: Nancy Marus, MD;  Location: [facility];  Service: Gynecology;  Laterality: Right;    Past Medical Hx:  Past Medical History:  Diagnosis Date  . Aortic valve stenosis, mild    per last echo in epic dated 10-31-2014  mild to moderate AVS,  valve area 1.46cm^2  . Arthritis   . Heart murmur   . History of GI bleed 04/ 2018;  04-20-2017   04/ 2018  upper GI, normal EGD, x1 unit PRBCs/  04-20-2017  upper GI, hg 5.5, normal EGD, x2 units PRBCs  . History of peptic ulcer   . History of radiation therapy 06/05/16 - 07/25/16   Pelvis (vulvar and bilateral inguinal) treated to 45 Gy in 25 fractions, then Boosted an additional 18 Gy in 9 fractions, vulvar region  . History of rheumatic fever age 61  . Hypertension   . Microcytic anemia   . Neuropathic pain    right goin  . PONV (postoperative nausea and vomiting)    years ago with back surgery-nausea  . Pulmonary nodules    last CT  10-07-2016, benign  . Recurrent vulvar cancer [facility]) oncologist-- dr Chealsey Miyamoto/  dr Lurline Idol   dx 01/ 2018  s/p vulvectomy 02-27-2016 , 04-09-2016 & 10-17-2016-- completed external radiation 07-25-2016:  recurrent Stage 3A SCC bilateral vulvar with  positive right inguinal node  . Type 2 diabetes mellitus ([place])   . Urinary incontinence due to urethral sphincter incompetence    weakness post vulvar radiation  . Vitamin D deficiency     Past Gynecological History:  Remote hx of hysterectomy for fibroids. No hx of abnromal paps (last pap remote) No LMP recorded. Patient has had a hysterectomy.  Family Hx:  Family History  Problem Relation Age of Onset  . Hypertension Mother   . Hypertension Father   . Throat cancer Father   . Cancer Sister        Breast  . Hypertension Brother     Review of Systems:  Constitutional  Feels well,    ENT Normal appearing ears and nares bilaterally Skin/Breast  + vulvar pruritis. Cardiovascular  No chest pain, shortness of breath, or edema  Pulmonary  No cough or wheeze.  Gastro Intestinal  No nausea, vomitting, or diarrhoea. No bright red blood per rectum, no abdominal pain, change in bowel movement, or constipation.  Genito Urinary  No frequency, urgency, dysuria, no bleeding Musculo Skeletal  No myalgia, arthralgia, joint swelling or pain  Neurologic  No weakness, numbness, change in gait,  Psychology  No depression, anxiety, insomnia.   Vitals:  Blood pressure 134/63, pulse 76, temperature 98.7 F (37.1 C), resp. rate 16, height 5\' 2"  (1.575 m), weight 193 lb 4.8 oz (87.7 kg), SpO2 96 %.  Physical Exam: WD in NAD Neck  Supple NROM, without any enlargements.  Lymph Node Survey No cervical supraclavicular or inguinal adenopathy. Right groin incision well healed.  Cardiovascular  Pulse normal rate, regularity and rhythm. S1 and S2 normal.  Lungs  Clear to auscultation bilateraly, without wheezes/crackles/rhonchi. Good air movement.  Skin  No rash/lesions/breakdown  Psychiatry  Alert and  oriented to person, place, and time  Abdomen  Normoactive bowel sounds, abdomen soft, non-tender and obese without evidence of hernia.  Back No CVA tenderness Genito Urinary   Vulva/vagina: Radiation changes present. Edematous mons pubis. Anterior mons incision in tact, persistent wound separation of peri-urethral incision with somewhat necrotic base.  There is now evidence of cellulitis surrouding right anterior mons pubis biopsy site, no evidence of recurrent tumor, though there is some induration of the surrounding skin edge. Spreading erythema on surrounding skin. The area of hyperaesthesia on the right groin crease is free of lesions or masses.   There is no palpable or visible periurethral tumor.   Bladder/urethra:  No lesions or masses, well supported bladder  Vagina: atrophic, no lesions  Cervix: surgically absent  Uterus: surgically absen  Adnexa: no palpable masses. Rectal  deferred Extremities  No bilateral cyanosis, clubbing or edema.    Thereasa Solo, MD  05/15/2017, 6:25 PM

## 2017-05-15 NOTE — Anesthesia Postprocedure Evaluation (Signed)
Anesthesia Post Note  Patient: [name]  Procedure(s) Performed: VULVAR BIOPSY (N/A Perineum) EXAM UNDER ANESTHESIA (N/A Perineum) DEBRIDEMENT VULVA  WOUND (N/A Perineum)     Patient location during evaluation: PACU Anesthesia Type: General Level of consciousness: awake and alert Pain management: pain level controlled Vital Signs Assessment: post-procedure vital signs reviewed and stable Respiratory status: spontaneous breathing, nonlabored ventilation, respiratory function stable and patient connected to nasal cannula oxygen Cardiovascular status: blood pressure returned to baseline and stable Postop Assessment: no apparent nausea or vomiting Anesthetic complications: no    Last Vitals:  Vitals:   05/15/17 1135 05/15/17 1200  BP:  135/74  Pulse: 79 80  Resp: (!) 9 12  Temp:    SpO2: 100% 100%    Last Pain:  Vitals:   05/15/17 1200  TempSrc:   PainSc: 1                  Jisel Fleet

## 2017-05-16 ENCOUNTER — Telehealth: Payer: Self-pay | Admitting: Oncology

## 2017-05-16 ENCOUNTER — Encounter (HOSPITAL_BASED_OUTPATIENT_CLINIC_OR_DEPARTMENT_OTHER): Payer: Self-pay | Admitting: Gynecologic Oncology

## 2017-05-16 DIAGNOSIS — C510 Malignant neoplasm of labium majus: Secondary | ICD-10-CM | POA: Diagnosis not present

## 2017-05-16 LAB — GLUCOSE, CAPILLARY: GLUCOSE-CAPILLARY: 128 mg/dL — AB (ref 65–99)

## 2017-05-16 MED ORDER — HYDROCODONE-ACETAMINOPHEN 5-325 MG PO TABS
ORAL_TABLET | ORAL | Status: AC
  Filled 2017-05-16: qty 1

## 2017-05-16 NOTE — Telephone Encounter (Signed)
Called [name] and advised her of the appointment with Dr. Denman George on 05/23/17 at 10:45 for a post op check.  She verbalized agreement and also said she had called her pharmacy and canceled the prescription for oxycodone-acetaminophen because she said she is already taking hydrocodone-acetaminophen which is helping with her pain.

## 2017-05-16 NOTE — Progress Notes (Signed)
np from Dr. Denman George practice changed patient packing and dressing

## 2017-05-16 NOTE — Telephone Encounter (Signed)
[place] and spoke to [place].  She said they should be calling Ms. Glade today or tomorrow to set up her visits.  Left a message on Dominiqua's home phone and also her son's voicemail.

## 2017-05-16 NOTE — Discharge Instructions (Signed)
Vulvectomy, Care After °The vulva is the external female genitalia, outside and around the vagina and pubic bone. It consists of: °· The skin on, and in front of, the pubic bone. °· The clitoris. °· The labia majora (large lips) on the outside of the vagina. °· The labia minora (small lips) around the opening of the vagina. °· The opening and the skin in and around the vagina. °A vulvectomy is the removal of the tissue of the vulva, which sometimes includes removal of the lymph nodes and tissue in the groin areas. °These discharge instructions provide you with general information on caring for yourself after you leave the hospital. It is also important that you know the warning signs of complications, so that you can seek treatment. Please read the instructions outlined below and refer to this sheet in the next few weeks. Your caregiver may also give you specific information and medicines. If you have any questions or complications after discharge, please call your caregiver. °ACTIVITY °· Rest as much as possible the first two weeks after discharge. °· Arrange to have help from family or others with your daily activities when you go home. °· Avoid heavy lifting (more than 5 pounds), pushing, or pulling. °· If you feel tired, balance your activity with rest periods. °· Follow your caregiver's instruction about climbing stairs and driving a car. °· Increase activity gradually. °· Do not exercise until you have permission from your caregiver. °LEG AND FOOT CARE °If your doctor has removed lymph nodes from your groin area, there may be an increase in swelling of your legs and feet. You can help prevent swelling by doing the following: °· Elevate your legs while sitting or lying down. °· If your caregiver has ordered special stockings, wear them according to instructions. °· Avoid standing in one place for long periods of time. °· Call the physical therapy department if you have any questions about swelling or treatment  for swelling. °· Avoid salt in your diet. It can cause fluid retention and swelling. °· Do not Imara Standiford your legs, especially when sitting. °NUTRITION °· You may resume your normal diet. °· Drink 6 to 8 glasses of fluids a day. °· Eat a healthy, balanced diet including portions of food from the meat (protein), milk, fruit, vegetable, and bread groups. °· Your caregiver may recommend you take a multivitamin with iron. °ELIMINATION °· You may notice that your stream of urine is at a different angle, and may tend to spray. Using a plastic funnel may help to decrease urine spray. °· If constipation occurs, drink more liquids, and add more fruits, vegetables, and bran to your diet. You may take a mild laxative, such as Milk of Magnesia, Metamucil, or a stool softener such as Colace, with permission from your caregiver. °HYGIENE °· You may shower and wash your hair. °· Check with your caregiver about tub baths. °· Do not add any bath oils or chemicals to your bath water, after you have permission to take baths. °· While passing urine, pour water from a bottle or spray over your vulva to dilute the urine as it passes the incision (this will decrease burning and discomfort). °· Clean yourself well after moving your bowels. °· After urinating, do not wipe. Dap or pat dry with toilet paper or a dry cleath soft cloth. °· A sitz bath will help keep your perineal area clean, reduce swelling, and provide comfort. °· Avoid wearing underpants for the first 2 weeks and wear loose skirts to   allow circulation of air around the incision  You do not need to apply dressings, salves or lotions to the wound.  CHANGE THE PACKING IN YOUR WOUND TWICE A DAY HOME CARE INSTRUCTIONS   Change dressing/packing in wound twice a day  Avoid activities that involve a lot of friction between your legs.  Avoid wearing pants or underpants in the 1st 2 weeks (skirts are preferable).  Take your temperature twice a day and record it, especially if  you feel feverish or have chills.  Follow your caregiver's instructions about medicines, activity, and follow-up appointments after surgery.  Do not drink alcohol while taking pain medicine.  Change your dressing as advised by your caregiver.  You may take over-the-counter medicine for pain, recommended by your caregiver.  If your pain is not relieved with medicine, call your caregiver.  Do not take aspirin because it can cause bleeding.  Do not douche or use tampons (use a nonperfumed sanitary pad).  Do not have sexual intercourse until your caregiver gives you permission (typically 6 weeks postoperatively). Hugging, kissing, and playful sexual activity is fine with your caregiver's permission.  Warm sitz baths, with your caregiver's permission, are helpful to control swelling and discomfort.  Take showers instead of baths, until your caregiver gives you permission to take baths.  You may take a mild medicine for constipation, recommended by your caregiver. Bran foods and drinking a lot of fluids will help with constipation.  Make sure your family understands everything about your operation and recovery. SEEK MEDICAL CARE IF:   You notice swelling and redness around the wound area.  You notice a foul smell coming from the wound or on the surgical dressing.  You notice the wound is separating.  You have painful or bloody urination.  You develop nausea and vomiting.  You develop diarrhea.  You develop a rash.  You have a reaction or allergy from the medicine.  You feel dizzy or light-headed.  You need stronger pain medicine. SEEK IMMEDIATE MEDICAL CARE IF:   You develop a temperature of 102 F (38.9 C) or higher.  You pass out.  You develop leg or chest pain.  You develop abdominal pain.  You develop shortness of breath.  You develop bleeding from the wound area.  You see pus in the wound area. MAKE SURE YOU:   Understand these instructions.  Will  watch your condition.  Will get help right away if you are not doing well or get worse. Document Released: 08/22/2003 Document Revised: 05/24/2013 Document Reviewed: 12/09/2008 [facility] Patient Information 2015 [place], [place]. This information is not intended to replace advice given to you by your health care provider. Make sure you discuss any questions you have with your health care provider.

## 2017-05-16 NOTE — Discharge Summary (Signed)
Physician Discharge Summary  Patient ID: [name] MRN: [id number] DOB/AGE: [birth year] 77 y.o.  Admit date: 05/15/2017 Discharge date: 05/16/2017  Admission Diagnoses: Vulvar abscess  Discharge Diagnoses:  Principal Problem:   Vulvar abscess Active Problems:   Vulva cancer [facility])   Discharged Condition:  The patient is in good condition and stable for discharge.   Hospital Course: On 05/15/2017, the patient underwent the following: Procedure(s): VULVAR BIOPSY, EXAM UNDER ANESTHESIA, DEBRIDEMENT VULVA  WOUND.  The postoperative course was uneventful.  She was discharged to home on postoperative day 1 tolerating a regular diet, voiding, BM this am, no pain reported.  She was instructed on packing the vulvar wound and supplies given.  [facility] to call patient to arrange services for wound assessment and management.  Consults: None  Significant Diagnostic Studies: None  Treatments: surgery: see above  Discharge Exam: Blood pressure (!) 161/74, pulse 78, temperature 97.7 F (36.5 C), resp. rate 20, height 5\' 2"  (1.575 m), weight 193 lb 4.8 oz (87.7 kg), SpO2 100 %. General appearance: alert, cooperative and no distress Resp: clear to auscultation bilaterally Cardio: regular rate and rhythm, S1, S2 normal, no murmur, click, rub or gallop GI: soft, non-tender; bowel sounds normal; no masses,  no organomegaly Extremities: extremities normal, atraumatic, no cyanosis or edema Incision/Wound: Vulvar wound open, packing removed before patient voiding and having BM.  Area cleansed with normal saline.  1 inch iodoform packing inserted into the opening. Patient tolerated well and verbalized understanding on how to change and care for dressing.  Disposition: Discharge disposition: 01-Home or Self Care       Discharge Instructions    Call MD for:  difficulty breathing, headache or visual disturbances   Complete by:  As directed    Call MD for:  extreme fatigue   Complete  by:  As directed    Call MD for:  hives   Complete by:  As directed    Call MD for:  persistant dizziness or light-headedness   Complete by:  As directed    Call MD for:  persistant nausea and vomiting   Complete by:  As directed    Call MD for:  redness, tenderness, or signs of infection (pain, swelling, redness, odor or green/yellow discharge around incision site)   Complete by:  As directed    Call MD for:  severe uncontrolled pain   Complete by:  As directed    Call MD for:  temperature >100.4   Complete by:  As directed    Diet - low sodium heart healthy   Complete by:  As directed    Discontinue IV   Complete by:  As directed    Driving Restrictions   Complete by:  As directed    No driving for 2 weeks if you were cleared to drive before.  Do not take narcotics and drive.   Increase activity slowly   Complete by:  As directed    Lifting restrictions   Complete by:  As directed    No lifting greater than 10 lbs.   Sexual Activity Restrictions   Complete by:  As directed    No sexual activity, nothing in the vagina, for 8 weeks.     Allergies as of 05/16/2017      Reactions   Oxycodone Other (See Comments)   Intense H/A   Celebrex [celecoxib] Other (See Comments)   GI upset   Doxycycline Nausea Only   Gi upset Pt is able to  tolerate with food   Iron Other (See Comments)   Pt cannot tolerate some iron preparations. The upset her stomach.   Lodine [etodolac] Nausea Only   Gi upset   Nabumetone Other (See Comments)   (relafen)  Gi upset   Tetracycline Nausea And Vomiting   Xarelto [rivaroxaban] Hives      Medication List    TAKE these medications   amoxicillin-clavulanate 875-125 MG tablet Commonly known as:  AUGMENTIN Take 1 tablet by mouth 2 (two) times daily.   aspirin 81 MG tablet Take 81 mg by mouth every evening.   ferrous sulfate 325 (65 FE) MG tablet Take 1 tablet (325 mg total) by mouth daily with breakfast.   furosemide 40 MG tablet Commonly  known as:  LASIX TAKE (1) TABLET BY MOUTH ONCE DAILY. What changed:  See the new instructions.   gabapentin 300 MG capsule Commonly known as:  NEURONTIN Take 1 capsule (300 mg total) by mouth 3 (three) times daily.   HYDROcodone-acetaminophen 5-325 MG tablet Commonly known as:  NORCO One tablet Q 4 hours prn. Caution drowsiness.   losartan 50 MG tablet Commonly known as:  COZAAR TAKE ONE TABLET BY MOUTH ONCE DAILY. What changed:    how much to take  how to take this  when to take this   metFORMIN 500 MG tablet Commonly known as:  GLUCOPHAGE TAKE (1) TABLET BY MOUTH TWICE DAILY.   oxyCODONE-acetaminophen 10-325 MG tablet Commonly known as:  PERCOCET Take 1 tablet by mouth every 4 (four) hours as needed for pain.   potassium chloride SA 20 MEQ tablet Commonly known as:  K-DUR,KLOR-CON TAKE 1 TABLET BY MOUTH ONCE A DAY. What changed:    how much to take  how to take this  when to take this      Follow-up Information    Everitt Amber, MD Follow up on 05/23/2017.   Specialty:  Obstetrics and Gynecology Why:  at 10:45am at the [facility] information: [place] [address] [phone]           Greater than thirty minutes were spend for face to face discharge instructions and discharge orders/summary in EPIC.   Signed: Dorothyann Gibbs 05/16/2017, 10:46 AM

## 2017-05-17 DIAGNOSIS — Z96642 Presence of left artificial hip joint: Secondary | ICD-10-CM | POA: Diagnosis not present

## 2017-05-17 DIAGNOSIS — Z483 Aftercare following surgery for neoplasm: Secondary | ICD-10-CM | POA: Diagnosis not present

## 2017-05-17 DIAGNOSIS — C519 Malignant neoplasm of vulva, unspecified: Secondary | ICD-10-CM | POA: Diagnosis not present

## 2017-05-17 DIAGNOSIS — Z9181 History of falling: Secondary | ICD-10-CM | POA: Diagnosis not present

## 2017-05-17 DIAGNOSIS — Z96651 Presence of right artificial knee joint: Secondary | ICD-10-CM | POA: Diagnosis not present

## 2017-05-17 DIAGNOSIS — E559 Vitamin D deficiency, unspecified: Secondary | ICD-10-CM | POA: Diagnosis not present

## 2017-05-17 DIAGNOSIS — M1990 Unspecified osteoarthritis, unspecified site: Secondary | ICD-10-CM | POA: Diagnosis not present

## 2017-05-17 DIAGNOSIS — N764 Abscess of vulva: Secondary | ICD-10-CM | POA: Diagnosis not present

## 2017-05-17 DIAGNOSIS — I10 Essential (primary) hypertension: Secondary | ICD-10-CM | POA: Diagnosis not present

## 2017-05-17 DIAGNOSIS — Z79891 Long term (current) use of opiate analgesic: Secondary | ICD-10-CM | POA: Diagnosis not present

## 2017-05-17 DIAGNOSIS — E119 Type 2 diabetes mellitus without complications: Secondary | ICD-10-CM | POA: Diagnosis not present

## 2017-05-17 DIAGNOSIS — Z7984 Long term (current) use of oral hypoglycemic drugs: Secondary | ICD-10-CM | POA: Diagnosis not present

## 2017-05-17 DIAGNOSIS — Z7982 Long term (current) use of aspirin: Secondary | ICD-10-CM | POA: Diagnosis not present

## 2017-05-17 DIAGNOSIS — D649 Anemia, unspecified: Secondary | ICD-10-CM | POA: Diagnosis not present

## 2017-05-19 ENCOUNTER — Telehealth: Payer: Self-pay | Admitting: *Deleted

## 2017-05-19 NOTE — Telephone Encounter (Signed)
Home health called and wanted to check to see if Dr. Denman George would be the doctor to sign the orders. Informed the nurse that called that Dr. Denman George would be the one to sign

## 2017-05-21 DIAGNOSIS — Z79891 Long term (current) use of opiate analgesic: Secondary | ICD-10-CM | POA: Diagnosis not present

## 2017-05-21 DIAGNOSIS — N764 Abscess of vulva: Secondary | ICD-10-CM | POA: Diagnosis not present

## 2017-05-21 DIAGNOSIS — Z96651 Presence of right artificial knee joint: Secondary | ICD-10-CM | POA: Diagnosis not present

## 2017-05-21 DIAGNOSIS — Z7984 Long term (current) use of oral hypoglycemic drugs: Secondary | ICD-10-CM | POA: Diagnosis not present

## 2017-05-21 DIAGNOSIS — Z483 Aftercare following surgery for neoplasm: Secondary | ICD-10-CM | POA: Diagnosis not present

## 2017-05-21 DIAGNOSIS — Z96642 Presence of left artificial hip joint: Secondary | ICD-10-CM | POA: Diagnosis not present

## 2017-05-21 DIAGNOSIS — C519 Malignant neoplasm of vulva, unspecified: Secondary | ICD-10-CM | POA: Diagnosis not present

## 2017-05-23 ENCOUNTER — Inpatient Hospital Stay: Payer: PPO | Attending: Gynecologic Oncology | Admitting: Gynecologic Oncology

## 2017-05-23 ENCOUNTER — Encounter: Payer: Self-pay | Admitting: Gynecologic Oncology

## 2017-05-23 VITALS — BP 108/68 | HR 102 | Temp 98.4°F | Resp 20 | Ht 62.0 in | Wt 190.5 lb

## 2017-05-23 DIAGNOSIS — Z7189 Other specified counseling: Secondary | ICD-10-CM

## 2017-05-23 DIAGNOSIS — C519 Malignant neoplasm of vulva, unspecified: Secondary | ICD-10-CM

## 2017-05-23 DIAGNOSIS — Z923 Personal history of irradiation: Secondary | ICD-10-CM

## 2017-05-23 DIAGNOSIS — D509 Iron deficiency anemia, unspecified: Secondary | ICD-10-CM | POA: Insufficient documentation

## 2017-05-23 DIAGNOSIS — R102 Pelvic and perineal pain: Secondary | ICD-10-CM | POA: Diagnosis not present

## 2017-05-23 DIAGNOSIS — Z5189 Encounter for other specified aftercare: Secondary | ICD-10-CM | POA: Insufficient documentation

## 2017-05-23 DIAGNOSIS — C775 Secondary and unspecified malignant neoplasm of intrapelvic lymph nodes: Secondary | ICD-10-CM | POA: Diagnosis not present

## 2017-05-23 DIAGNOSIS — Z90710 Acquired absence of both cervix and uterus: Secondary | ICD-10-CM | POA: Diagnosis not present

## 2017-05-23 DIAGNOSIS — R918 Other nonspecific abnormal finding of lung field: Secondary | ICD-10-CM | POA: Diagnosis not present

## 2017-05-23 DIAGNOSIS — E1140 Type 2 diabetes mellitus with diabetic neuropathy, unspecified: Secondary | ICD-10-CM | POA: Insufficient documentation

## 2017-05-23 DIAGNOSIS — Z5111 Encounter for antineoplastic chemotherapy: Secondary | ICD-10-CM | POA: Insufficient documentation

## 2017-05-23 DIAGNOSIS — Z90722 Acquired absence of ovaries, bilateral: Secondary | ICD-10-CM | POA: Diagnosis not present

## 2017-05-23 NOTE — Patient Instructions (Addendum)
Please return to see Dr Denman George in 1 month as scheduled to check on your wound.  Please contact Dr Serita Grit office [phone]) at your convenience to let her office know when it would be convenient to meet with the chemotherapy doctor (Dr Alvy Bimler) at the [facility].  Continue twice daily dressing changes to the vulva.

## 2017-05-23 NOTE — Progress Notes (Signed)
Consult Note: Gyn-Onc  Consult was requested by Dr. Elonda Husky for the evaluation of [name] 77 y.o. female   CC:  Chief Complaint  Patient presents with  . Vulva cancer [facility])    Assessment/Plan:  [name]  is a 77 y.o.  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 S/p adjuvant vulvar and inguinal (bilateral) radiation. Recurrent vulvar squamous cell carcinoma. I offered the patient consultation with my partner at [facility], Dr. Aggie Cosier, for consideration of an extensive resection of this anterior vulvar lesion with myocutaneous flap.  He would also resect the recurrent area on the left ovary at the same time.  After learning some of the details of such as surgery and what it would involve, and considering the multifocal nature of her disease in addition to the multiple bouts of recurrence she has endured within a few months of each form of surgery, the patient is reluctant to undergo this.  She understands that not undergoing surgical resection will result in her tumor being  incurable.  She is willing to consider chemotherapy as a palliative option to potentially slow and control the growth of these vulvar lesions.  I will have her seen by my colleague Dr. Heath Lark for consideration of systemic therapy with platinum and taxane and possibly bevacizumab at her discretion.  We have ordered a PET scan to evaluate for disseminated disease though none was noted on the CT abdomen and pelvis from April 2019.  Neuropathic pain in right groin (not apparent directly involved with recurrence)- already on Neurontin -recommended to continue 3 times per day.  Urinary incontinence (likely from urethral sphincter weakness). Recommend eval by Dr Matilde Sprang. Patient prefers to hold off while recovering from GI bleeding.   HPI: [name] is a 77 year old woman who is seen in consultation at the request of Dr Elonda Husky  for a right labial lesion.    The patient reports that she began feeling pruritis of the vulva in October, 2017. She was seen by her PCP who saw no lesion and prescribed estradiol.  The pruritis persisted and became worse and she began to feel a nodule in the area and therefore again saw her PCP followed by a referral to gynecologist, Dr Elonda Husky on 02/20/16. The nodular lesion was appreciated on the right vulva. No biopsy was taken at that time. Biopsy was performed on 02/22/16 and pathology from biopsy confirmed invasive SCC.   CT scan abdo/pelvis was negative for evidence of suspicious nodes or distant mets.  On 02/22/16 she underwent right radical vulvectomy and right inguinal lymphadenectomy with Dr Nancy Marus. Final pathology revealed: a 2.1cm moderately differentiated SCC of the right vulva. Margins were negative (closest was 3 o'clock, 27mm). Depth of invasion was 75mm. 1 of 6 inguinofemoral lymph nodes (right) was positive for metastatic disease.   CT chest on 04/01/16 showed: Multiple pulmonary nodules as detailed above, generally in the 61mm and less average diameter range although with 1 right lower lobe subpleural nodule measuring about 8 mm in diameter. These likely warrant surveillance. Hypodense right thyroid nodule, 1.1 cm in long axis. Consider further evaluation with thyroid ultrasound. If patient is clinically hyperthyroid, consider nuclear medicine thyroid uptake and scan. Stable hypodense lateral splenic lesion, likely a cyst.  Postoperatively after her first vulvectomy she developed irritation on the left vulvar lip. She saw [place] for evaluation in March and complained of this irritation. A nodular  lesion was seen in the mid portion of the left labia minora in a "kissing" location to where the primary lesion had been. It was biopsied and confirmed as recurrent SCC of the vulva.  On 04/09/16 she was again taken to the OR, this time for a radical left vulvectomy. A decision was made to not  perform left inguinal lymphadenectomy, so as to not delay radiation further (with an additional groin wound to heal), but to instead extend the radiation to both groins.  Pathology from this surgery confirmed a poorly differentiated squamous cell carcinoma of the left vulva with 33mm of invasion. The inferior 6 o'clock margin was "broadly" positive (though macroscopically this margin was grossly negative.  She was recommended to receive adjuvant radiation to the vulva and groins. Radiation treatment dates of 06/05/16-07/25/16 where she received 45Gy in 72fractions directed at the bilateral inguinal and vulvar area. The patient then proceeded with a boost to the vulvar region the light of the positive surgical margin and continue to a dose of 63 gray.  She developed burning and itching on the anterior right vulva in August, 2018 and a 2cm lesion was seen on the anterior vulva approximating the urethral meatus. It was biopsied in September, 2018 and this confirmed recurrent SCC.  On 10/17/16 she underwent radical anterior vulvectomy. A 3cm raised vulvar lesion was seen at the midline and right vulva extending to within 0.5cm (macroscopically) to the urethral meatus.  Final pathology confirmed SCC, 2.7cm with 32mm invasion, negative magins though there was a very close (43mm) urethral margin.  Repeat Chest CT in September, 2018 showed stable benign appearing nodules.  She saw Dr Sondra Come in December, 2018 and there was concern for possible recurrence at the periurethral incision area due to necrosis.  She was being followed for a 1cm mass at the mons pubis incision.  She has noted this area getting larger and with burning sensation when it touches the fabric of her clothes.   A 2.5cm fluctuant area was noted to the right of the midline mons incision. It was biopsied on 02/17/17 with pathology showing no recurrence only inflammatory changes consistent with an abscess.  She had some initial mild relief to  the office I&D, but subseqently developed increasing pain again. She denies fevers. She has separate right inguinal crease neuralgia (burning pain). Minimal relief from gabapentin BID.   Interval Hx:  She was discharged from hospital on 04/22/17 for a GI bleed after receiving transfusion. No imaging performed. She had a GI bleed in April, 2018 with a colonoscopy and swallow study which showed no source.  Continued groin burning.   We ordered a CT scan on 05/01/17 to evaluate for occult source of GI bleeding. It demonstrated a 4+cm area of gas filled tissue on the right anterior mons consistent with either abscess vs recurrent necrotic tumor.  Due to the concern that this abscess represented recurrent cancer, we performed an exam under anesthesia, debridement of vulvar abscess and biopsy of abscess cavity with vulvar biopsies on 05/05/17.  The operative findings were very concerning for recurrence of her malignancy at the anterior vulva.  There is a small nodular area appreciated in the mid left labia majora at the inferior aspect of 1 of her prior incision sites.  This was biopsied.  An additional nodule was appreciated in the posterior right labia majora this was also biopsied.  Final pathology from the surgery returned as recurrent invasive moderate to poorly differentiated squamous cell carcinoma with an associated ulcer  necrosis and acute inflammation at the anterior right mons pubis lesion.  The left mid labial lesion was positive for poorly differentiated squamous cell carcinoma.  The right labia majora lesion was benign.  This pathology finding represented a new recurrence x2 from this [facility].  Current Meds:  Outpatient Encounter Medications as of 05/23/2017  Medication Sig  . amoxicillin-clavulanate (AUGMENTIN) 875-125 MG tablet Take 1 tablet by mouth 2 (two) times daily.  Marland Kitchen aspirin 81 MG tablet Take 81 mg by mouth every evening.   . furosemide (LASIX) 40 MG tablet TAKE (1) TABLET BY MOUTH ONCE  DAILY. (Patient taking differently: TAKE (1) TABLET BY MOUTH ONCE DAILY.--- takes in am)  . gabapentin (NEURONTIN) 300 MG capsule Take 1 capsule (300 mg total) by mouth 3 (three) times daily. (Patient taking differently: Take 300 mg by mouth 3 (three) times daily. )  . HYDROcodone-acetaminophen (NORCO) 5-325 MG tablet One tablet Q 4 hours prn. Caution drowsiness.  Marland Kitchen losartan (COZAAR) 50 MG tablet TAKE ONE TABLET BY MOUTH ONCE DAILY. (Patient taking differently: TAKE ONE TABLET BY MOUTH ONCE DAILY.--- takes in am)  . metFORMIN (GLUCOPHAGE) 500 MG tablet TAKE (1) TABLET BY MOUTH TWICE DAILY.  Marland Kitchen potassium chloride SA (K-DUR,KLOR-CON) 20 MEQ tablet TAKE 1 TABLET BY MOUTH ONCE A DAY. (Patient taking differently: TAKE 1 TABLET BY MOUTH ONCE A DAY.  --- takes in am)  . ferrous sulfate 325 (65 FE) MG tablet Take 1 tablet (325 mg total) by mouth daily with breakfast.  . oxyCODONE-acetaminophen (PERCOCET) 10-325 MG tablet Take 1 tablet by mouth every 4 (four) hours as needed for pain. (Patient not taking: Reported on 05/23/2017)   No facility-administered encounter medications on file as of 05/23/2017.     Allergy:  Allergies  Allergen Reactions  . Oxycodone Other (See Comments)    Intense H/A  . Celebrex [Celecoxib] Other (See Comments)    GI upset   . Doxycycline Nausea Only    Gi upset Pt is able to tolerate with food  . Iron Other (See Comments)    Pt cannot tolerate some iron preparations. The upset her stomach.  . Lodine [Etodolac] Nausea Only    Gi upset  . Nabumetone Other (See Comments)    (relafen)  Gi upset  . Tetracycline Nausea And Vomiting  . Xarelto [Rivaroxaban] Hives    Social Hx:   Social History   Socioeconomic History  . Marital status: Married    Spouse name: Not on file  . Number of children: 2  . Years of education: Not on file  . Highest education level: Not on file  Occupational History  . Not on file  Social Needs  . Financial resource strain: Not on file  .  Food insecurity:    Worry: Not on file    Inability: Not on file  . Transportation needs:    Medical: Not on file    Non-medical: Not on file  Tobacco Use  . Smoking status: Never Smoker  . Smokeless tobacco: Never Used  Substance and Sexual Activity  . Alcohol use: No    Alcohol/week: 0.0 oz  . Drug use: No  . Sexual activity: Not on file  Lifestyle  . Physical activity:    Days per week: Not on file    Minutes per session: Not on file  . Stress: Not on file  Relationships  . Social connections:    Talks on phone: Not on file    Gets together: Not on file  Attends religious service: Not on file    Active member of club or organization: Not on file    Attends meetings of clubs or organizations: Not on file    Relationship status: Not on file  . Intimate partner violence:    Fear of current or ex partner: Not on file    Emotionally abused: Not on file    Physically abused: Not on file    Forced sexual activity: Not on file  Other Topics Concern  . Not on file  Social History Narrative  . Not on file    Past Surgical Hx:  Past Surgical History:  Procedure Laterality Date  . ABDOMINAL HYSTERECTOMY  1979   W/ BILATERAL SALPINGOOPHORECTOMY  . ANTERIOR CERVICAL DECOMP/DISCECTOMY FUSION  11-09-2002   dr Hal Neer  [facility]   C3--6  . CATARACT EXTRACTION W/ INTRAOCULAR LENS  IMPLANT, BILATERAL  yrs ago  . COLONOSCOPY N/A 05/05/2016   Procedure: COLONOSCOPY;  Surgeon: Daneil Dolin, MD;  [facility];  Service: Endoscopy;  Laterality: N/A;  . DEBRIDEMENT AND CLOSURE WOUND N/A 05/15/2017   Procedure: DEBRIDEMENT VULVA  WOUND;  Surgeon: Everitt Amber, MD;  Location: [facility];  Service: Gynecology;  Laterality: N/A;  . ESOPHAGOGASTRODUODENOSCOPY N/A 05/04/2016   Procedure: ESOPHAGOGASTRODUODENOSCOPY (EGD);  Surgeon: Daneil Dolin, MD;  [facility];  Service: Endoscopy;  Laterality: N/A;  . ESOPHAGOGASTRODUODENOSCOPY N/A 04/21/2017   Procedure:  ESOPHAGOGASTRODUODENOSCOPY (EGD);  Surgeon: Ladene Artist, MD;  [facility];  Service: Endoscopy;  Laterality: N/A;  . GIVENS CAPSULE STUDY N/A 05/06/2016   Procedure: GIVENS CAPSULE STUDY;  Surgeon: Daneil Dolin, MD;  [facility];  Service: Endoscopy;  Laterality: N/A;  . KNEE ARTHROSCOPY Left 1980s;   05-18-2002   dr Rhona Raider  [facility]  . LUMBAR SPINE SURGERY  1991    dr Tonita Cong  . RADICAL VULVECTOMY Left 04/09/2016   Procedure: RADICAL LEFT VULVECTOMY;  Surgeon: Everitt Amber, MD;  Location: [facility];  Service: Gynecology;  Laterality: Left;  . RADICAL VULVECTOMY  10/17/2016   Procedure: RADICAL ANTERIOR VULVECTOMY;  Surgeon: Everitt Amber, MD;  Location: [facility];  Service: Gynecology;;  . TOE SURGERY Left   . TONSILLECTOMY  1967  . TOTAL HIP ARTHROPLASTY Left 10/27/2013   Procedure: LEFT TOTAL HIP ARTHROPLASTY;  Surgeon: Tobi Bastos, MD;  Location: [facility];  Service: Orthopedics;  Laterality: Left;  . TOTAL KNEE ARTHROPLASTY Right 03-28-2010    dr Gladstone Lighter  [facility]  . TRANSTHORACIC ECHOCARDIOGRAM  10/31/2014   mild focal basal hypertrophy of the septum, ef 21-30%, grade 1 diastolic dysfunction/  moderate AV stenosis, valve area 1.46cm^2/  trivial MR/  mild PR and TR/  mild RAE/ PASP 36mmHg  . VULVA /PERINEUM BIOPSY  02/22/2016  . VULVA Milagros Loll BIOPSY N/A 05/15/2017   Procedure: VULVAR BIOPSY;  Surgeon: Everitt Amber, MD;  Location: [facility];  Service: Gynecology;  Laterality: N/A;  . VULVECTOMY Right 02/27/2016   Procedure: RADICAL RIGHT VULVECTOMY WITH RIGHT INGUINAL LYMPH NODE DISSECTION;  Surgeon: Nancy Marus, MD;  Location: [facility];  Service: Gynecology;  Laterality: Right;    Past Medical Hx:  Past Medical History:  Diagnosis Date  . Aortic valve stenosis, mild    per last echo in epic dated 10-31-2014  mild to moderate AVS,  valve area 1.46cm^2  . Arthritis   . Heart murmur   . History of GI bleed 04/ 2018;  04-20-2017   04/ 2018  upper  GI, normal  EGD, x1 unit PRBCs/  04-20-2017  upper GI, hg 5.5, normal EGD, x2 units PRBCs  . History of peptic ulcer   . History of radiation therapy 06/05/16 - 07/25/16   Pelvis (vulvar and bilateral inguinal) treated to 45 Gy in 25 fractions, then Boosted an additional 18 Gy in 9 fractions, vulvar region  . History of rheumatic fever age 62  . Hypertension   . Microcytic anemia   . Neuropathic pain    right goin  . PONV (postoperative nausea and vomiting)    years ago with back surgery-nausea  . Pulmonary nodules    last CT  10-07-2016, benign  . Recurrent vulvar cancer [facility]) oncologist-- dr Barbaraann Avans/  dr Lurline Idol   dx 01/ 2018  s/p vulvectomy 02-27-2016 , 04-09-2016 & 10-17-2016-- completed external radiation 07-25-2016:  recurrent Stage 3A SCC bilateral vulvar with positive right inguinal node  . Type 2 diabetes mellitus ([place])   . Urinary incontinence due to urethral sphincter incompetence    weakness post vulvar radiation  . Vitamin D deficiency     Past Gynecological History:  Remote hx of hysterectomy for fibroids. No hx of abnromal paps (last pap remote) No LMP recorded. Patient has had a hysterectomy.  Family Hx:  Family History  Problem Relation Age of Onset  . Hypertension Mother   . Hypertension Father   . Throat cancer Father   . Cancer Sister        Breast  . Hypertension Brother     Review of Systems:  Constitutional  Feels well,    ENT Normal appearing ears and nares bilaterally Skin/Breast  + vulvar pruritis. Cardiovascular  No chest pain, shortness of breath, or edema  Pulmonary  No cough or wheeze.  Gastro Intestinal  No nausea, vomitting, or diarrhoea. No bright red blood per rectum, no abdominal pain, change in bowel movement, or constipation.  Genito Urinary  No frequency, urgency, dysuria, no bleeding Musculo Skeletal  No myalgia, arthralgia, joint swelling or pain  Neurologic  No weakness, numbness, change in gait,  Psychology  No depression, anxiety,  insomnia.   Vitals:  Blood pressure 108/68, pulse (!) 102, temperature 98.4 F (36.9 C), temperature source Oral, resp. rate 20, height 5\' 2"  (1.575 m), weight 190 lb 8 oz (86.4 kg), SpO2 100 %.  Physical Exam: WD in NAD Neck  Supple NROM, without any enlargements.  Lymph Node Survey No cervical supraclavicular or inguinal adenopathy. Right groin incision well healed.  Cardiovascular  Pulse normal rate, regularity and rhythm. S1 and S2 normal.  Lungs  Clear to auscultation bilateraly, without wheezes/crackles/rhonchi. Good air movement.  Skin  No rash/lesions/breakdown  Psychiatry  Alert and oriented to person, place, and time  Abdomen  Normoactive bowel sounds, abdomen soft, non-tender and obese without evidence of hernia.  Back No CVA tenderness Genito Urinary  Vulva/vagina: Radiation changes present. 5cm cancer on right anterior mons pubis with opened epithelium, packed with iodoform, granulating well. There is no longer evidence of cellulitis surrouding right anterior mons pubis biopsy site,. There remains nodularity at the left inferior/mid labia majora at the 2nd site of recurrence.   There is no palpable or visible periurethral tumor.   Bladder/urethra:  No lesions or masses, well supported bladder  Vagina: atrophic, no lesions  Cervix: surgically absent  Uterus: surgically absen  Adnexa: no palpable masses. Rectal  deferred Extremities  No bilateral cyanosis, clubbing or edema.   30 minutes of direct face to face counseling time was  spent with the patient. This included discussion about prognosis, therapy recommendations and postoperative side effects and are beyond the scope of routine postoperative care.   Thereasa Solo, MD  05/23/2017, 5:53 PM

## 2017-05-26 ENCOUNTER — Encounter: Payer: Self-pay | Admitting: *Deleted

## 2017-05-26 ENCOUNTER — Other Ambulatory Visit: Payer: Self-pay | Admitting: Gynecologic Oncology

## 2017-05-26 DIAGNOSIS — C519 Malignant neoplasm of vulva, unspecified: Secondary | ICD-10-CM

## 2017-05-27 ENCOUNTER — Other Ambulatory Visit: Payer: Self-pay

## 2017-05-27 ENCOUNTER — Ambulatory Visit (INDEPENDENT_AMBULATORY_CARE_PROVIDER_SITE_OTHER): Payer: PPO | Admitting: Internal Medicine

## 2017-05-27 ENCOUNTER — Encounter: Payer: Self-pay | Admitting: Hematology and Oncology

## 2017-05-27 ENCOUNTER — Other Ambulatory Visit (HOSPITAL_COMMUNITY)
Admission: RE | Admit: 2017-05-27 | Discharge: 2017-05-27 | Disposition: A | Discharge status: Home / Self Care | Payer: PPO | Source: Ambulatory Visit | Attending: Internal Medicine | Admitting: Internal Medicine

## 2017-05-27 ENCOUNTER — Encounter: Payer: Self-pay | Admitting: Internal Medicine

## 2017-05-27 VITALS — BP 116/70 | HR 116 | Temp 97.0°F | Ht 62.0 in | Wt 187.6 lb

## 2017-05-27 DIAGNOSIS — D508 Other iron deficiency anemias: Secondary | ICD-10-CM

## 2017-05-27 DIAGNOSIS — K921 Melena: Secondary | ICD-10-CM | POA: Insufficient documentation

## 2017-05-27 DIAGNOSIS — R195 Other fecal abnormalities: Secondary | ICD-10-CM

## 2017-05-27 DIAGNOSIS — E440 Moderate protein-calorie malnutrition: Secondary | ICD-10-CM | POA: Insufficient documentation

## 2017-05-27 LAB — CBC WITH DIFFERENTIAL/PLATELET
BASOS ABS: 0 10*3/uL (ref 0.0–0.1)
BASOS PCT: 0 %
Eosinophils Absolute: 0.1 10*3/uL (ref 0.0–0.7)
Eosinophils Relative: 1 %
HEMATOCRIT: 25.5 % — AB (ref 36.0–46.0)
Hemoglobin: 7.9 g/dL — ABNORMAL LOW (ref 12.0–15.0)
Lymphocytes Relative: 12 %
Lymphs Abs: 1.1 10*3/uL (ref 0.7–4.0)
MCH: 27.3 pg (ref 26.0–34.0)
MCHC: 31 g/dL (ref 30.0–36.0)
MCV: 88.2 fL (ref 78.0–100.0)
Monocytes Absolute: 0.8 10*3/uL (ref 0.1–1.0)
Monocytes Relative: 8 %
NEUTROS ABS: 7.1 10*3/uL (ref 1.7–7.7)
NEUTROS PCT: 79 %
Platelets: 267 10*3/uL (ref 150–400)
RBC: 2.89 MIL/uL — AB (ref 3.87–5.11)
RDW: 13.8 % (ref 11.5–15.5)
WBC: 9.1 10*3/uL (ref 4.0–10.5)

## 2017-05-27 NOTE — Patient Instructions (Signed)
CBC, Ferritin, iron and TIBC today  May need another blood transfusion and further evaluation depending on degree ofanemia  Further recommendations to follow in the very near future

## 2017-05-27 NOTE — Progress Notes (Signed)
Primary Care Physician:  Mikey Kirschner, MD Primary Gastroenterologist:  Dr. Gala Romney  Pre-Procedure History & Physical: HPI:  [name] is a 77 y.o. female here for for follow-up of anemia. Just over a month ago her hemoglobin was 9.2 and 31.3; no recheck since that time.  Hemoglobin was 7.7 the day before, . H&H was 5.5 back on 04/20/2017.Marland Kitchen She was admitted and received packed RBCs.. Patient states she has chronically dark stools, taking oral iron. Denies hematochezia, no abdominal pain ,no hematemesis, early satiety, nausea vomiting.  History of well-documented stage IIIa vulvar cancer. She had multiple surgical procedures with biopsy-proven recurrence. Multiple pulmonary nodules felt to be benign.  Presented last year with GI bleeding(melena) for which he underwent EGD, colonoscopy and capsule study the small bowel. Only positive finding was the possibility of some old blood/ clot in the distal small bowel. She denies taking any nonsteroidals. Not anticoagulated.  Past Medical History:  Diagnosis Date  . Aortic valve stenosis, mild    per last echo in epic dated 10-31-2014  mild to moderate AVS,  valve area 1.46cm^2  . Arthritis   . Heart murmur   . History of GI bleed 04/ 2018;  04-20-2017   04/ 2018  upper GI, normal EGD, x1 unit PRBCs/  04-20-2017  upper GI, hg 5.5, normal EGD, x2 units PRBCs  . History of peptic ulcer   . History of radiation therapy 06/05/16 - 07/25/16   Pelvis (vulvar and bilateral inguinal) treated to 45 Gy in 25 fractions, then Boosted an additional 18 Gy in 9 fractions, vulvar region  . History of rheumatic fever age 65  . Hypertension   . Microcytic anemia   . Neuropathic pain    right goin  . PONV (postoperative nausea and vomiting)    years ago with back surgery-nausea  . Pulmonary nodules    last CT  10-07-2016, benign  . Recurrent vulvar cancer [facility]) oncologist-- dr rossi/  dr Lurline Idol   dx 01/ 2018  s/p vulvectomy 02-27-2016 , 04-09-2016 &  10-17-2016-- completed external radiation 07-25-2016:  recurrent Stage 3A SCC bilateral vulvar with positive right inguinal node  . Type 2 diabetes mellitus ([place])   . Urinary incontinence due to urethral sphincter incompetence    weakness post vulvar radiation  . Vitamin D deficiency     Past Surgical History:  Procedure Laterality Date  . ABDOMINAL HYSTERECTOMY  1979   W/ BILATERAL SALPINGOOPHORECTOMY  . ANTERIOR CERVICAL DECOMP/DISCECTOMY FUSION  11-09-2002   dr Hal Neer  [facility]   C3--6  . CATARACT EXTRACTION W/ INTRAOCULAR LENS  IMPLANT, BILATERAL  yrs ago  . COLONOSCOPY N/A 05/05/2016   Procedure: COLONOSCOPY;  Surgeon: Daneil Dolin, MD;  [facility];  Service: Endoscopy;  Laterality: N/A;  . DEBRIDEMENT AND CLOSURE WOUND N/A 05/15/2017   Procedure: DEBRIDEMENT VULVA  WOUND;  Surgeon: Everitt Amber, MD;  Location: [facility];  Service: Gynecology;  Laterality: N/A;  . ESOPHAGOGASTRODUODENOSCOPY N/A 05/04/2016   Procedure: ESOPHAGOGASTRODUODENOSCOPY (EGD);  Surgeon: Daneil Dolin, MD;  [facility];  Service: Endoscopy;  Laterality: N/A;  . ESOPHAGOGASTRODUODENOSCOPY N/A 04/21/2017   Procedure: ESOPHAGOGASTRODUODENOSCOPY (EGD);  Surgeon: Ladene Artist, MD;  Location[facility];  Service: Endoscopy;  Laterality: N/A;  . GIVENS CAPSULE STUDY N/A 05/06/2016   Procedure: GIVENS CAPSULE STUDY;  Surgeon: Daneil Dolin, MD;  [facility];  Service: Endoscopy;  Laterality: N/A;  . KNEE ARTHROSCOPY Left 1980s;  05-18-2002   dr Rhona Raider  [facility]  . LUMBAR SPINE SURGERY  1991    dr Tonita Cong  . RADICAL VULVECTOMY Left 04/09/2016   Procedure: RADICAL LEFT VULVECTOMY;  Surgeon: Everitt Amber, MD;  Location: [facility];  Service: Gynecology;  Laterality: Left;  . RADICAL VULVECTOMY  10/17/2016   Procedure: RADICAL ANTERIOR VULVECTOMY;  Surgeon: Everitt Amber, MD;  Location: [facility];  Service: Gynecology;;  . TOE SURGERY Left   . TONSILLECTOMY  1967  . TOTAL HIP  ARTHROPLASTY Left 10/27/2013   Procedure: LEFT TOTAL HIP ARTHROPLASTY;  Surgeon: Tobi Bastos, MD;  Location: [facility];  Service: Orthopedics;  Laterality: Left;  . TOTAL KNEE ARTHROPLASTY Right 03-28-2010    dr Gladstone Lighter  [facility]  . TRANSTHORACIC ECHOCARDIOGRAM  10/31/2014   mild focal basal hypertrophy of the septum, ef 37-62%, grade 1 diastolic dysfunction/  moderate AV stenosis, valve area 1.46cm^2/  trivial MR/  mild PR and TR/  mild RAE/ PASP 27mmHg  . VULVA /PERINEUM BIOPSY  02/22/2016  . VULVA Milagros Loll BIOPSY N/A 05/15/2017   Procedure: VULVAR BIOPSY;  Surgeon: Everitt Amber, MD;  Location: [facility];  Service: Gynecology;  Laterality: N/A;  . VULVECTOMY Right 02/27/2016   Procedure: RADICAL RIGHT VULVECTOMY WITH RIGHT INGUINAL LYMPH NODE DISSECTION;  Surgeon: Nancy Marus, MD;  Location: [facility];  Service: Gynecology;  Laterality: Right;    Prior to Admission medications   Medication Sig Start Date End Date Taking? Authorizing Provider  amoxicillin-clavulanate (AUGMENTIN) 875-125 MG tablet Take 1 tablet by mouth 2 (two) times daily. 05/13/17  Yes Everitt Amber, MD  aspirin 81 MG tablet Take 81 mg by mouth every evening.    Yes [provider]  ferrous sulfate 325 (65 FE) MG tablet Take 1 tablet (325 mg total) by mouth daily with breakfast. 04/11/17  Yes Nilda Simmer, NP  furosemide (LASIX) 40 MG tablet TAKE (1) TABLET BY MOUTH ONCE DAILY. Patient taking differently: TAKE (1) TABLET BY MOUTH ONCE DAILY.--- takes in am 02/07/17  Yes Luking, Grace Bushy, MD  gabapentin (NEURONTIN) 300 MG capsule Take 1 capsule (300 mg total) by mouth 3 (three) times daily. Patient taking differently: Take 300 mg by mouth 3 (three) times daily.  04/11/17  Yes Nilda Simmer, NP  HYDROcodone-acetaminophen (NORCO) 5-325 MG tablet One tablet Q 4 hours prn. Caution drowsiness. 05/09/17  Yes Luking, Elayne Snare, MD  losartan (COZAAR) 50 MG tablet TAKE ONE TABLET BY MOUTH ONCE DAILY. Patient  taking differently: TAKE ONE TABLET BY MOUTH ONCE DAILY.--- takes in am 12/23/16  Yes Luking, Grace Bushy, MD  metFORMIN (GLUCOPHAGE) 500 MG tablet TAKE (1) TABLET BY MOUTH TWICE DAILY. 04/15/17  Yes Mikey Kirschner, MD  potassium chloride SA (K-DUR,KLOR-CON) 20 MEQ tablet TAKE 1 TABLET BY MOUTH ONCE A DAY. Patient taking differently: TAKE 1 TABLET BY MOUTH ONCE A DAY.  --- takes in am 11/29/16  Yes Mikey Kirschner, MD  oxyCODONE-acetaminophen (PERCOCET) 10-325 MG tablet Take 1 tablet by mouth every 4 (four) hours as needed for pain. Patient not taking: Reported on 05/27/2017 05/15/17   Everitt Amber, MD    Allergies as of 05/27/2017 - Review Complete 05/27/2017  Allergen Reaction Noted  . Oxycodone Other (See Comments) 10/24/2016  . Celebrex [celecoxib] Other (See Comments) 10/26/2014  . Doxycycline Nausea Only 05/21/2012  . Iron Other (See Comments) 05/29/2016  . Lodine [etodolac] Nausea Only 05/21/2012  . Nabumetone Other (See Comments)   . Tetracycline Nausea And Vomiting   .  Xarelto [rivaroxaban] Hives 10/26/2014    Family History  Problem Relation Age of Onset  . Hypertension Mother   . Hypertension Father   . Throat cancer Father   . Cancer Sister        Breast  . Hypertension Brother     Social History   Socioeconomic History  . Marital status: Married    Spouse name: Not on file  . Number of children: 2  . Years of education: Not on file  . Highest education level: Not on file  Occupational History  . Not on file  Social Needs  . Financial resource strain: Not on file  . Food insecurity:    Worry: Not on file    Inability: Not on file  . Transportation needs:    Medical: Not on file    Non-medical: Not on file  Tobacco Use  . Smoking status: Never Smoker  . Smokeless tobacco: Never Used  Substance and Sexual Activity  . Alcohol use: No    Alcohol/week: 0.0 oz  . Drug use: No  . Sexual activity: Not on file  Lifestyle  . Physical activity:    Days per week:  Not on file    Minutes per session: Not on file  . Stress: Not on file  Relationships  . Social connections:    Talks on phone: Not on file    Gets together: Not on file    Attends religious service: Not on file    Active member of club or organization: Not on file    Attends meetings of clubs or organizations: Not on file    Relationship status: Not on file  . Intimate partner violence:    Fear of current or ex partner: Not on file    Emotionally abused: Not on file    Physically abused: Not on file    Forced sexual activity: Not on file  Other Topics Concern  . Not on file  Social History Narrative  . Not on file    Review of Systems: See HPI, otherwise negative ROS  Physical Exam: BP 116/70   Pulse (!) 116   Temp (!) 97 F (36.1 C) (Oral)   Ht 5\' 2"  (1.575 m)   Wt 187 lb 9.6 oz (85.1 kg)   BMI 34.31 kg/m  General:   Alert,   pleasant and cooperative in NAD Skin:  Intact without significant lesions or rashes. Palor present Eyes:  Sclera clear, no icterus. pale Lungs:  Clear throughout to auscultation.   No wheezes, crackles, or rhonchi. No acute distress. Heart:  3/6 systolic ejection murmur.  Regular rate and rhythm; , clicks, rubs,  or gallops. Abdomen: Non-distended, normal bowel sounds.  Soft and nontender without appreciable mass or hepatosplenomegaly.  Pulses:  Normal pulses noted. Extremities:  Without clubbing or edema. Rectal:  Anal stenosis. DRE limited. Scant brown stool is Hemoccult positive.   Impression:  Pleasant 77 year old lady with metastatic vulvar cancer and recurrent anemia. Hemoccult positive today.  Chronically dark stools. An element of melena not excluded. No overt GI bleeding. I suspect anemia has been progressive recently. She states she is anxious today. She is mildly tachycardic. She needs updated labs.  Recommendations:  We'll proceed with the labs including CBC and iron studies. Will try and get them stat.  I explained to [name]  she may need another blood transfusion. She may benefit from IV iron therapy.  Further recommendation shortly.    Notice: This dictation was prepared with  Dragon dictation along with smaller Company secretary. Any transcriptional errors that result from this process are unintentional and may not be corrected upon review.

## 2017-05-28 ENCOUNTER — Other Ambulatory Visit: Payer: Self-pay | Admitting: *Deleted

## 2017-05-28 DIAGNOSIS — D508 Other iron deficiency anemias: Secondary | ICD-10-CM

## 2017-05-28 LAB — IRON AND TIBC
Iron: 19 ug/dL — ABNORMAL LOW (ref 28–170)
Saturation Ratios: 4 % — ABNORMAL LOW (ref 10.4–31.8)
TIBC: 434 ug/dL (ref 250–450)
UIBC: 415 ug/dL

## 2017-05-28 LAB — FERRITIN: FERRITIN: 5 ng/mL — AB (ref 11–307)

## 2017-05-29 ENCOUNTER — Encounter (HOSPITAL_COMMUNITY): Payer: Self-pay | Admitting: Emergency Medicine

## 2017-05-29 ENCOUNTER — Other Ambulatory Visit: Payer: Self-pay | Admitting: Nurse Practitioner

## 2017-05-29 ENCOUNTER — Telehealth: Payer: Self-pay | Admitting: Internal Medicine

## 2017-05-29 ENCOUNTER — Telehealth: Payer: Self-pay | Admitting: Family Medicine

## 2017-05-29 MED ORDER — HYDROCODONE-ACETAMINOPHEN 5-325 MG PO TABS: ORAL_TABLET | ORAL | 0 refills | Status: DC

## 2017-05-29 NOTE — Telephone Encounter (Signed)
Pt.notified

## 2017-05-29 NOTE — Telephone Encounter (Signed)
Amy from the cancer center called to let us know patient has appointment to a [facility] on 06/02/2017 and do they need to cancel the referral we sent to them as STAT. I told her that would be up to the patient where she wanted to go. Amy said that we needed to just cancel the referral to them then.

## 2017-05-29 NOTE — Telephone Encounter (Signed)
Message for [name]--Patient is requesting refill hydrocodone 5/325.

## 2017-05-29 NOTE — Telephone Encounter (Signed)
Done. Also asked them to charge her account and deliver.

## 2017-05-29 NOTE — Telephone Encounter (Signed)
Confirmed with patient is she wants to be seen at the cancer center in [place]. Referral cancelled

## 2017-05-30 ENCOUNTER — Ambulatory Visit: Payer: PPO | Admitting: Gynecologic Oncology

## 2017-06-02 ENCOUNTER — Encounter: Payer: Self-pay | Admitting: Hematology and Oncology

## 2017-06-02 ENCOUNTER — Inpatient Hospital Stay (HOSPITAL_BASED_OUTPATIENT_CLINIC_OR_DEPARTMENT_OTHER): Payer: PPO | Admitting: Hematology and Oncology

## 2017-06-02 ENCOUNTER — Inpatient Hospital Stay: Payer: PPO

## 2017-06-02 ENCOUNTER — Encounter: Payer: Self-pay | Admitting: Oncology

## 2017-06-02 ENCOUNTER — Encounter (HOSPITAL_COMMUNITY)
Admission: RE | Admit: 2017-06-02 | Discharge: 2017-06-02 | Disposition: A | Discharge status: Home / Self Care | Payer: PPO | Source: Ambulatory Visit | Attending: Gynecologic Oncology | Admitting: Gynecologic Oncology

## 2017-06-02 VITALS — BP 121/60 | HR 111 | Temp 98.4°F | Resp 18 | Ht 62.0 in | Wt 190.2 lb

## 2017-06-02 DIAGNOSIS — C519 Malignant neoplasm of vulva, unspecified: Secondary | ICD-10-CM

## 2017-06-02 DIAGNOSIS — E1140 Type 2 diabetes mellitus with diabetic neuropathy, unspecified: Secondary | ICD-10-CM | POA: Diagnosis not present

## 2017-06-02 DIAGNOSIS — D62 Acute posthemorrhagic anemia: Secondary | ICD-10-CM

## 2017-06-02 DIAGNOSIS — D509 Iron deficiency anemia, unspecified: Secondary | ICD-10-CM | POA: Diagnosis not present

## 2017-06-02 DIAGNOSIS — Z5111 Encounter for antineoplastic chemotherapy: Secondary | ICD-10-CM | POA: Diagnosis not present

## 2017-06-02 LAB — CBC WITH DIFFERENTIAL/PLATELET
BASOS ABS: 0 10*3/uL (ref 0.0–0.1)
BASOS PCT: 0 %
EOS ABS: 0.1 10*3/uL (ref 0.0–0.5)
Eosinophils Relative: 1 %
HEMATOCRIT: 20.4 % — AB (ref 34.8–46.6)
HEMOGLOBIN: 5.9 g/dL — AB (ref 11.6–15.9)
Lymphocytes Relative: 18 %
Lymphs Abs: 1 10*3/uL (ref 0.9–3.3)
MCH: 25.8 pg (ref 25.1–34.0)
MCHC: 28.9 g/dL — AB (ref 31.5–36.0)
MCV: 89.1 fL (ref 79.5–101.0)
Monocytes Absolute: 0.4 10*3/uL (ref 0.1–0.9)
Monocytes Relative: 7 %
NEUTROS ABS: 4.2 10*3/uL (ref 1.5–6.5)
NEUTROS PCT: 74 %
Platelets: 239 10*3/uL (ref 145–400)
RBC: 2.29 MIL/uL — AB (ref 3.70–5.45)
RDW: 14.5 % (ref 11.2–14.5)
WBC: 5.6 10*3/uL (ref 3.9–10.3)

## 2017-06-02 LAB — COMPREHENSIVE METABOLIC PANEL
ALK PHOS: 62 U/L (ref 40–150)
ALT: 11 U/L (ref 0–55)
ANION GAP: 8 (ref 3–11)
AST: 13 U/L (ref 5–34)
Albumin: 3.7 g/dL (ref 3.5–5.0)
BUN: 19 mg/dL (ref 7–26)
CALCIUM: 9.3 mg/dL (ref 8.4–10.4)
CO2: 26 mmol/L (ref 22–29)
CREATININE: 0.75 mg/dL (ref 0.60–1.10)
Chloride: 107 mmol/L (ref 98–109)
Glucose, Bld: 93 mg/dL (ref 70–140)
Potassium: 3.9 mmol/L (ref 3.5–5.1)
SODIUM: 141 mmol/L (ref 136–145)
TOTAL PROTEIN: 6.6 g/dL (ref 6.4–8.3)
Total Bilirubin: 0.3 mg/dL (ref 0.2–1.2)

## 2017-06-02 LAB — PREPARE RBC (CROSSMATCH)

## 2017-06-02 LAB — SAMPLE TO BLOOD BANK

## 2017-06-02 LAB — ABO/RH: ABO/RH(D): B POS

## 2017-06-02 LAB — GLUCOSE, CAPILLARY: GLUCOSE-CAPILLARY: 110 mg/dL — AB (ref 65–99)

## 2017-06-02 MED ORDER — DIPHENHYDRAMINE HCL 25 MG PO CAPS
25.0000 mg | ORAL_CAPSULE | Freq: Once | ORAL | Status: AC
  Administered 2017-06-02: 25 mg via ORAL

## 2017-06-02 MED ORDER — ACETAMINOPHEN 325 MG PO TABS
650.0000 mg | ORAL_TABLET | Freq: Once | ORAL | Status: AC
  Administered 2017-06-02: 650 mg via ORAL

## 2017-06-02 MED ORDER — SODIUM CHLORIDE 0.9% FLUSH
3.0000 mL | INTRAVENOUS | Status: DC | PRN
  Filled 2017-06-02: qty 10

## 2017-06-02 MED ORDER — HEPARIN SOD (PORK) LOCK FLUSH 100 UNIT/ML IV SOLN
500.0000 [IU] | Freq: Every day | INTRAVENOUS | Status: DC | PRN
  Filled 2017-06-02: qty 5

## 2017-06-02 MED ORDER — SODIUM CHLORIDE 0.9 % IV SOLN
250.0000 mL | Freq: Once | INTRAVENOUS | Status: AC
  Administered 2017-06-02: 250 mL via INTRAVENOUS

## 2017-06-02 MED ORDER — HEPARIN SOD (PORK) LOCK FLUSH 100 UNIT/ML IV SOLN
250.0000 [IU] | INTRAVENOUS | Status: DC | PRN
  Filled 2017-06-02: qty 5

## 2017-06-02 MED ORDER — ACETAMINOPHEN 325 MG PO TABS
ORAL_TABLET | ORAL | Status: AC
  Filled 2017-06-02: qty 2

## 2017-06-02 MED ORDER — SODIUM CHLORIDE 0.9% FLUSH
10.0000 mL | INTRAVENOUS | Status: DC | PRN
  Filled 2017-06-02: qty 10

## 2017-06-02 MED ORDER — DIPHENHYDRAMINE HCL 25 MG PO CAPS
ORAL_CAPSULE | ORAL | Status: AC
  Filled 2017-06-02: qty 1

## 2017-06-02 MED ORDER — FLUDEOXYGLUCOSE F - 18 (FDG) INJECTION
9.5000 | Freq: Once | INTRAVENOUS | Status: AC | PRN
Start: 2017-06-02 — End: 2017-06-02
  Administered 2017-06-02: 9.5 via INTRAVENOUS

## 2017-06-02 NOTE — Progress Notes (Signed)
START OFF PATHWAY REGIMEN - [Other Dx]   [id number]:Carboplatin + Paclitaxel (5/175) q21 Days:   A cycle is every 21 days:     Paclitaxel      Carboplatin   **Always confirm dose/schedule in your pharmacy ordering system**  Patient Characteristics: Intent of Therapy: Non-Curative / Palliative Intent, Discussed with Patient 

## 2017-06-02 NOTE — Progress Notes (Signed)
[place] CONSULT NOTE  Patient Care Team: Mikey Kirschner, MD as PCP - General (Family Medicine)  ASSESSMENT & PLAN:  Vulva cancer [name]) PET CT scan is pending I will see her back to review test results The patient understood that the purpose of chemotherapy is palliative in nature I recommend PICC line placement due to risk of infection with open wound She is scheduled for chemo education class and see me back before starting her on treatment I recommend combination chemotherapy with carboplatin and Taxol with reduced dose due to her debility and baseline peripheral neuropathy  Iron deficiency anemia She has severe iron deficiency anemia likely due to chronic bleeding I recommend blood transfusion due for significant symptomatic anemia She will also be prescribed intravenous iron replacement therapy in the future We discussed some of the risks, benefits, and alternatives of blood transfusions. The patient is symptomatic from anemia and the hemoglobin level is critically low.  Some of the side-effects to be expected including risks of transfusion reactions, chills, infection, syndrome of volume overload and risk of hospitalization from various reasons and the patient is willing to proceed and went ahead to sign consent today. I will proceed with 1 unit of blood transfusion  Diabetes mellitus with diabetic neuropathy, without long-term current use of insulin ([place]) She has chronic type 2 diabetes with peripheral neuropathy I plan to reduce the dose of Taxol and premedication dexamethasone in the future   Orders Placed This Encounter  Procedures  . IR Fluoro Guide CV Line Left    Indicate type of CVC ordering    Standing Status:   Future    Standing Expiration Date:   08/03/2018    Order Specific Question:   Reason for exam:    Answer:   need PICC for chemo    Order Specific Question:   Preferred Imaging Location?    Answer:   [facility]  . Comprehensive  metabolic panel    Standing Status:   Standing    Number of Occurrences:   22    Standing Expiration Date:   06/03/2018  . CBC with Differential/Platelet    Standing Status:   Standing    Number of Occurrences:   22    Standing Expiration Date:   06/03/2018  . Sample to Blood Bank    Standing Status:   Standing    Number of Occurrences:   33    Standing Expiration Date:   06/03/2018  . Type and screen    Standing Status:   Future    Standing Expiration Date:   06/02/2018     CHIEF COMPLAINTS/PURPOSE OF CONSULTATION:  Recurrent vulvar cancer  HISTORY OF PRESENTING ILLNESS:  [name] 77 y.o. female is here because of history of recurrent vulvar cancer.  Her husband, son and grandson were present in the visit. The patient had history of recurrent vulvar cancer since early last year.  She had numerous surgeries including radiation but her cancer did not go away. At present time, she has an open sore requiring daily packing and wound dressing changes in the vulvar region.  She denies recent bleeding. The patient has history of recurrent iron deficiency anemia.  Her blood count last week performed by her GI doctor show severe anemia.  She has not received any blood transfusion. The patient has ice pica.  She does not like to eat meat.  She has never been transfused before. She was prescribed oral iron supplement of which  she took daily with breakfast over the past month The patient denies any recent signs or symptoms of bleeding such as spontaneous epistaxis, hematuria or hematochezia.  Her son noted significant shortness of breath even talking over the phone.  She have some leg cramps and some weakness because of her severe anemia She has baseline peripheral neuropathy from diabetes.  Her diabetes is fairly well controlled.  I have reviewed her chart and materials related to her cancer extensively and collaborated history with the patient. Summary of oncologic history is as follows:    Vulva cancer ([place])   02/22/2016 Pathology Results    Labium, biopsy, right mid - INVASIVE SQUAMOUS CELL CARCINOMA.      02/23/2016 Imaging    Ct abdomen and pelvis Mild asymmetric soft tissue thickening in the right vulvar/perineal region, likely corresponding to the patient's known vulvar cancer, poorly evaluated on CT.  No evidence of metastatic disease.  Status post hysterectomy.      02/27/2016 Pathology Results    1. Lymph nodes, regional resection, right inguinal - METASTATIC CARCINOMA IN 1 OF 6 LYMPH NODES (1/6). 2. Vulva, excision, right - INVASIVE KERATINIZING SQUAMOUS CELL CARCINOMA, MODERATELY DIFFERENTIATED, SPANNING 2.1 CM WITH A DEPTH OF INVASION OF 0.6 CM. - THE SURGICAL RESECTION MARGIN ARE NEGATIVE FOR CARCINOMA. - DIFFUSE LICHEN SCLEROSUS ET ATROPHICUS. - SEE ONCOLOGY TABLE BELOW. Microscopic Comment 2. VULVA Specimen: Right vulvectomy. Procedure: Resection Lymph node sampling performed: Yes. Tumor site: Right vulva Tumor focality: Carcinoma is unifocal. Maximum tumor size (cm): 2.1 cm (gross measurement). Histologic type: Keratinizing squmaous cell carcinoma. Grade: Moderately differentiated. Depth of stromal invasion (mm): 6 mm (glass slide measurement) Margins: Negative for carcinoma. Distance of carcinoma from nearest margin: 0.6 cm to the 3 o'clock margin. Lymph - Vascular invasion: Not identified. Lymph nodes: number examined: 6 Number positive: 1 size of metastatic focus: 0.3 cm (glass slide measurement) extracapsular extension: Not identified. TNM code: pT1b, pN1a FIGO Stage (based on pathologic findings, needs clinical correlation): IIIA Comment: Grossly, there is a 2.1 cm sessile papular lesion which on histologic evaluation reveals invasive keratinizing squamous cell carcinoma invading to a depth of 0.6 cm as measured from a glass slide. In addition, there is diffuse lichen sclerosus et atrophicus. The surgical resection margins are negative for  invasive caricnoma. Additional studies can be performed upon clinical request.      02/27/2016 Surgery    Surgery: Right radical partial vulvectomy, right inguinal lymph node dissection  Surgeons:  Imagene Gurney A. Alycia Rossetti, MD; Lahoma Crocker, MD   Pathology: Right vulva with resection margin with suture at 12:00. Right inguinal nodes.   Operative findings: 1.5 cm fungating erosive lesion of the right vulva approximately 2 cm from the urethra. Evidence of lichen sclerosis throughout the entire vulvar. No other ulcerative lesion suspicious for vulvar carcinoma. Shotty palpable lymphadenopathy in the right inguinal space.        04/01/2016 Imaging    Ct chest 1. Multiple pulmonary nodules as detailed above, generally in the 4 mm and less average diameter range although with 1 right lower lobe subpleural nodule measuring about 8 mm in diameter. These likely warrant surveillance. 2. Hypodense right thyroid nodule, 1.1 cm in long axis. Consider further evaluation with thyroid ultrasound. If patient is clinically hyperthyroid, consider nuclear medicine thyroid uptake and scan. 3. Stable hypodense lateral splenic lesion, likely a cyst. 4. Aortic and coronary atherosclerosis.       04/02/2016 Pathology Results    Labium, biopsy, left minora INVASIVE SQUAMOUS CELL CARCINOMA  04/09/2016 Pathology Results    1. Vulva, vulvectomy, left - INVASIVE SQUAMOUS CELL CARCINOMA, NON-KERATINIZING, SPANNING 2.4 CM IN WIDTH X 0.4 CM IN DEPTH. - CARCINOMA IS BROADLY PRESENT AT THE 6 O'CLOCK MARGIN OF SPECIMEN #1. - SEE ONCOLOGY TABLE BELOW. 2. Vulva, excision, lateral margin - SQUAMOUS LINED EPITHELIUM WITH LICHEN SCLEROSUS ET ATROPHICUS. 3. Vulva, excision, medial margin - SQUAMOUS LINED EPITHELIUM WITH LICHEN SCLEROSUS ET ATROPHICUS. Microscopic Comment 1. VULVA Specimen: Left vulva. Procedure: Left vulvectomy with additional lateral and medial margins. Lymph node sampling performed: Not on  current case. Tumor site: Left vulva. Tumor focality: Unifocal. Maximum tumor size (cm): 2.4 cm in width (gross measurement) Histologic type: Squamous cell carcinoma, non-keratinizing. Grade: High grade. Depth of stromal invasion (mm): 0.4 cm (4 mm), glass slide measurement. Margins: Invasive carcinoma broadly present at the 6 o'clock margin of specimen #1. Lymph - Vascular invasion: Not identified. Lymph nodes: None examined. TNM code: pT1b, pNX FIGO Stage (based on pathologic findings, needs clinical correlation): IB      04/09/2016 Surgery    Surgery: Partial radical left vulvectomy  Surgeons:  Donaciano Eva, MD  Pathology: 1/ left vulva with marking stitch at 12 o'clock anterior, 2/ lateral (thigh) margin with stitch at true new lateral margin. 3/ medial (vaginal) margin with marking stitch at true new medial margin. Operative findings: 2cm left mid labial nodular lesion        05/03/2016 Imaging    Ct chest 1.  No evidence of acute pulmonary embolus. 2. Calcified coronary artery atherosclerosis. Negative visible aorta. 3. Pulmonary atelectasis with no other acute pulmonary process. The small subpleural pulmonary nodules seen by CT last month are stable.       06/05/2016 - 07/25/2016 Radiation Therapy    Radiation treatment dates:   06/05/16 - 07/25/16  Site/dose:   Pelvis treated to 45 Gy in 25 fractions, then vulvar region Boosted an additional 18 Gy in 9 fractions.  Beams/energy:   Pelvis:  IMRT  //  6X                              Boost:  IMRT  //  6X       09/30/2016 Pathology Results    Vulva, biopsy, anterior vulva INVASIVE SQUAMOUS CELL CARCINOMA      10/07/2016 Imaging    Ct chest 1. Stable bilateral pulmonary nodules, likely benign but will require surveillance. Recommend followup noncontrast chest CT in 6-12 months. 2. No mediastinal or hilar mass or adenopathy. 3. Stable coronary artery calcifications.  Aortic Atherosclerosis (ICD10-I70.0).       10/17/2016 Pathology Results    Vulva, vulvectomy, anterior - INVASIVE SQUAMOUS CELL CARCINOMA, SPANNING 2.7 CM AND EXTENDING TO A DEPTH OF 0.3 CM. - RESECTION MARGINS ARE NEGATIVE. - SEE ONCOLOGY TABLE. Microscopic Comment VULVA Specimen: Anterior vulva. Procedure: Partial anterior vulvectomy. Lymph node sampling performed: None. Tumor site: Anterior vulva. Tumor focality: Unifocal. Maximum tumor size (cm): 2.7 cm. Histologic type: Squamous cell carcinoma, non-keratinizing. Grade: G2, moderately differentiated. Depth of stromal invasion (mm): 3 mm. Margins: Negative Distance of carcinoma from nearest margin: Invasive tumor is 1 mm from urethral margin. Lymph - Vascular invasion: Not identified. Lymph nodes: None. TNM code: pT1b, pNX FIGO Stage (based on pathologic findings, needs clinical correlation): IB      10/17/2016 Surgery    Surgery: Partial radical anterior vulvectomy  Surgeons:  Donaciano Eva, MD  Pathology: anterior vulva with marking  stitch at anterior clitoral margin o'clock  Operative findings: 3cm raised vulvar cancer (leukoplakia) at midline and right vulva extending to within 0.5cm of anterior urethral meatus        02/17/2017 Pathology Results    Vulva, biopsy - PARAKERATOSIS AND ACUTE INFLAMMATION. - PAS IS NEGATIVE FOR FUNGAL ORGANISMS. - NO DYSPLASIA OR MALIGNANCY.      05/01/2017 Imaging    Ct abdomen and pelvis New 4 x 3 cm rim enhancing lesion central air bubbles in the right vulvar region, which could represent locally recurrent tumor with necrosis or abscess.  No evidence of metastatic disease within the abdomen or pelvis.      05/15/2017 Pathology Results    1. Labium, biopsy, right posterior labia majora - PARAKERATOSIS. - NO DYSPLASIA OR MALIGNANCY. 2. Vulva, excision, anterior right mons pubis lesion - INVASIVE MODERATE TO POORLY DIFFERENTIATED SQUAMOUS CELL CARCINOMA. - ASSOCIATED ULCER, NECROSIS AND ACUTE  INFLAMMATION. - SEE COMMENT. 3. Labium, biopsy, left mid labia majora - POORLY DIFFERENTIATED SQUAMOUS CELL CARCINOMA. - SEE COMMENT. Microscopic Comment 2. The specimen is disrupted hampering assessment of margin orientation and size, however, carcinoma involves almost the entire specimen (thus size estimated to be at least 3.1 cm with depth of 1 cm) and extends to the majority of the presumed lateral and deep margins. There is perineural invasion. 3. The carcinoma is largely beneath the epidermis and extends to the deep and lateral edges of the biopsy.      05/15/2017 Surgery    Surgery: debridement of anterior vulvar abscess, vulvar biopsies.   Surgeons:  Donaciano Eva, MD  Pathology: right posterior labia majora, anterior mons pubis, left mid labia majora  Operative findings: 6cm cavitating anterior mons pubis indurated lesion. It is approximately 3.5cm from the urethral meatus to inferior aspect of mass. Two indurated 1-2cm nodular areas (one on the posterior right labia majora and one on the mid left labia majora). No periurethral lesions.        She denies nausea or changes in bowel habits  MEDICAL HISTORY:  Past Medical History:  Diagnosis Date  . Aortic valve stenosis, mild    per last echo in epic dated 10-31-2014  mild to moderate AVS,  valve area 1.46cm^2  . Arthritis   . Heart murmur   . History of GI bleed 04/ 2018;  04-20-2017   04/ 2018  upper GI, normal EGD, x1 unit PRBCs/  04-20-2017  upper GI, hg 5.5, normal EGD, x2 units PRBCs  . History of peptic ulcer   . History of radiation therapy 06/05/16 - 07/25/16   Pelvis (vulvar and bilateral inguinal) treated to 45 Gy in 25 fractions, then Boosted an additional 18 Gy in 9 fractions, vulvar region  . History of rheumatic fever age 89  . Hypertension   . Microcytic anemia   . Neuropathic pain    right goin  . PONV (postoperative nausea and vomiting)    years ago with back surgery-nausea  . Pulmonary  nodules    last CT  10-07-2016, benign  . Recurrent vulvar cancer [facility]) oncologist-- dr rossi/  dr Lurline Idol   dx 01/ 2018  s/p vulvectomy 02-27-2016 , 04-09-2016 & 10-17-2016-- completed external radiation 07-25-2016:  recurrent Stage 3A SCC bilateral vulvar with positive right inguinal node  . Type 2 diabetes mellitus ([place])   . Urinary incontinence due to urethral sphincter incompetence    weakness post vulvar radiation  . Vitamin D deficiency     SURGICAL HISTORY: Past Surgical  History:  Procedure Laterality Date  . ABDOMINAL HYSTERECTOMY  1979   W/ BILATERAL SALPINGOOPHORECTOMY  . ANTERIOR CERVICAL DECOMP/DISCECTOMY FUSION  11-09-2002   dr Hal Neer  [facility]   C3--6  . CATARACT EXTRACTION W/ INTRAOCULAR LENS  IMPLANT, BILATERAL  yrs ago  . COLONOSCOPY N/A 05/05/2016   Procedure: COLONOSCOPY;  Surgeon: Daneil Dolin, MD;  [facility];  Service: Endoscopy;  Laterality: N/A;  . DEBRIDEMENT AND CLOSURE WOUND N/A 05/15/2017   Procedure: DEBRIDEMENT VULVA  WOUND;  Surgeon: Everitt Amber, MD;  Location: [facility];  Service: Gynecology;  Laterality: N/A;  . ESOPHAGOGASTRODUODENOSCOPY N/A 05/04/2016   Procedure: ESOPHAGOGASTRODUODENOSCOPY (EGD);  Surgeon: Daneil Dolin, MD;  [facility];  Service: Endoscopy;  Laterality: N/A;  . ESOPHAGOGASTRODUODENOSCOPY N/A 04/21/2017   Procedure: ESOPHAGOGASTRODUODENOSCOPY (EGD);  Surgeon: Ladene Artist, MD;  Location: [facility];  Service: Endoscopy;  Laterality: N/A;  . GIVENS CAPSULE STUDY N/A 05/06/2016   Procedure: GIVENS CAPSULE STUDY;  Surgeon: Daneil Dolin, MD;  [facility]: [facility];  Service: Endoscopy;  Laterality: N/A;  . KNEE ARTHROSCOPY Left 1980s;   05-18-2002   dr Rhona Raider  [facility]  . LUMBAR SPINE SURGERY  1991    dr Tonita Cong  . RADICAL VULVECTOMY Left 04/09/2016   Procedure: RADICAL LEFT VULVECTOMY;  Surgeon: Everitt Amber, MD;  Location: [facility];  Service: Gynecology;  Laterality: Left;  . RADICAL VULVECTOMY   10/17/2016   Procedure: RADICAL ANTERIOR VULVECTOMY;  Surgeon: Everitt Amber, MD;  Location: [facility];  Service: Gynecology;;  . TOE SURGERY Left   . TONSILLECTOMY  1967  . TOTAL HIP ARTHROPLASTY Left 10/27/2013   Procedure: LEFT TOTAL HIP ARTHROPLASTY;  Surgeon: Tobi Bastos, MD;  Location: [facility];  Service: Orthopedics;  Laterality: Left;  . TOTAL KNEE ARTHROPLASTY Right 03-28-2010    dr Gladstone Lighter  [facility]  . TRANSTHORACIC ECHOCARDIOGRAM  10/31/2014   mild focal basal hypertrophy of the septum, ef 26-37%, grade 1 diastolic dysfunction/  moderate AV stenosis, valve area 1.46cm^2/  trivial MR/  mild PR and TR/  mild RAE/ PASP 68mmHg  . VULVA /PERINEUM BIOPSY  02/22/2016  . VULVA Milagros Loll BIOPSY N/A 05/15/2017   Procedure: VULVAR BIOPSY;  Surgeon: Everitt Amber, MD;  Location: [facility];  Service: Gynecology;  Laterality: N/A;  . VULVECTOMY Right 02/27/2016   Procedure: RADICAL RIGHT VULVECTOMY WITH RIGHT INGUINAL LYMPH NODE DISSECTION;  Surgeon: Nancy Marus, MD;  Location: [facility];  Service: Gynecology;  Laterality: Right;    SOCIAL HISTORY: Social History   Socioeconomic History  . Marital status: Married    Spouse name: Mortimer Fries  . Number of children: 2  . Years of education: Not on file  . Highest education level: Not on file  Occupational History  . Occupation: Retired Geographical information systems officer  . Financial resource strain: Not on file  . Food insecurity:    Worry: Not on file    Inability: Not on file  . Transportation needs:    Medical: Not on file    Non-medical: Not on file  Tobacco Use  . Smoking status: Never Smoker  . Smokeless tobacco: Never Used  Substance and Sexual Activity  . Alcohol use: No    Alcohol/week: 0.0 oz  . Drug use: No  . Sexual activity: Not on file  Lifestyle  . Physical activity:    Days per week: Not on file    Minutes per session: Not on file  .  Stress: Not on file  Relationships  . Social connections:    Talks on phone: Not on  file    Gets together: Not on file    Attends religious service: Not on file    Active member of club or organization: Not on file    Attends meetings of clubs or organizations: Not on file    Relationship status: Not on file  . Intimate partner violence:    Fear of current or ex partner: Not on file    Emotionally abused: Not on file    Physically abused: Not on file    Forced sexual activity: Not on file  Other Topics Concern  . Not on file  Social History Narrative  . Not on file    FAMILY HISTORY: Family History  Problem Relation Age of Onset  . Hypertension Mother   . Hypertension Father   . Throat cancer Father   . Cancer Sister        Breast  . Hypertension Brother     ALLERGIES:  is allergic to oxycodone; celebrex [celecoxib]; doxycycline; iron; lodine [etodolac]; nabumetone; tetracycline; and xarelto [rivaroxaban].  MEDICATIONS:  Current Outpatient Medications  Medication Sig Dispense Refill  . ferrous sulfate 325 (65 FE) MG tablet Take 1 tablet (325 mg total) by mouth daily with breakfast. 30 tablet 1  . gabapentin (NEURONTIN) 300 MG capsule Take 1 capsule (300 mg total) by mouth 3 (three) times daily. (Patient taking differently: Take 300 mg by mouth 3 (three) times daily. ) 90 capsule 2  . HYDROcodone-acetaminophen (NORCO) 5-325 MG tablet One tablet Q 4 hours prn. Caution drowsiness. 60 tablet 0  . losartan (COZAAR) 50 MG tablet TAKE ONE TABLET BY MOUTH ONCE DAILY. (Patient taking differently: TAKE ONE TABLET BY MOUTH ONCE DAILY.--- takes in am) 90 tablet 1  . metFORMIN (GLUCOPHAGE) 500 MG tablet TAKE (1) TABLET BY MOUTH TWICE DAILY. 180 tablet 0  . oxyCODONE-acetaminophen (PERCOCET) 10-325 MG tablet Take 1 tablet by mouth every 4 (four) hours as needed for pain. (Patient not taking: Reported on 05/27/2017) 30 tablet 0  . potassium chloride SA (K-DUR,KLOR-CON) 20 MEQ tablet TAKE 1 TABLET BY MOUTH ONCE A DAY. (Patient taking differently: TAKE 1 TABLET BY MOUTH ONCE A  DAY.  --- takes in am) 90 tablet 0   No current facility-administered medications for this visit.     REVIEW OF SYSTEMS:   Constitutional: Denies fevers, chills or abnormal night sweats Eyes: Denies blurriness of vision, double vision or watery eyes Ears, nose, mouth, throat, and face: Denies mucositis or sore throatwheezes Cardiovascular: Denies palpitation, chest discomfort or lower extremity swelling Gastrointestinal:  Denies nausea, heartburn or change in bowel habits Skin: Denies abnormal skin rashes Lymphatics: Denies new lymphadenopathy or easy bruising Neurological:Denies numbness, tingling or new weaknesses Behavioral/Psych: Mood is stable, no new changes  All other systems were reviewed with the patient and are negative.  PHYSICAL EXAMINATION: ECOG PERFORMANCE STATUS: 2 - Symptomatic, <50% confined to bed  Vitals:   06/02/17 1045  BP: 121/60  Pulse: (!) 111  Resp: 18  Temp: 98.4 F (36.9 C)  SpO2: 98%   Filed Weights   06/02/17 1045  Weight: 190 lb 3.2 oz (86.3 kg)    GENERAL:alert, no distress and comfortable.  She looks very pale SKIN: skin color is pale, texture, turgor are normal, no rashes or significant lesions EYES: normal, conjunctiva are pale and non-injected, sclera clear OROPHARYNX:no exudate, no erythema and lips, buccal mucosa, and tongue normal  NECK: supple, thyroid normal size, non-tender, without nodularity LYMPH:  no palpable lymphadenopathy in the cervical, axillary or inguinal LUNGS: clear to auscultation and percussion with normal breathing effort HEART: regular rate & rhythm with loud midsystolic murmur on the left sternal border and no lower extremity edema ABDOMEN:abdomen soft, non-tender and normal bowel sounds Musculoskeletal:no cyanosis of digits and no clubbing  PSYCH: alert & oriented x 3 with fluent speech NEURO: no focal motor/sensory deficits Genitourinary examination revealed an open wound over the right vulvar region at least 2  inches in size without signs of active bleeding.  Granulation tissue is noted.  LABORATORY DATA:  I have reviewed the data as listed Lab Results  Component Value Date   WBC 9.1 05/27/2017   HGB 7.9 (L) 05/27/2017   HCT 25.5 (L) 05/27/2017   MCV 88.2 05/27/2017   PLT 267 05/27/2017   Recent Labs    04/11/17 1125 04/20/17 1410 04/21/17 0435  NA 141 142 142  K 3.5 3.8 3.8  CL 103 107 108  CO2 25 27 27   GLUCOSE 128* 123* 106*  BUN 33* 18 13  CREATININE 0.81 0.72 0.70  CALCIUM 9.4 8.8* 8.5*  GFRNONAA >60 >60 >60  GFRAA >60 >60 >60  PROT 7.1 6.0* 5.5*  ALBUMIN 3.7 3.2* 2.9*  AST 20 14* 13*  ALT 17 13* 11*  ALKPHOS 66 58 48  BILITOT 0.7 0.3 0.9  BILIDIR 0.1  --   --   IBILI 0.6  --   --     I spent 60 minutes counseling the patient face to face. The total time spent in the appointment was 80 minutes and more than 50% was on counseling.  All questions were answered. The patient knows to call the clinic with any problems, questions or concerns.  Heath Lark, MD 06/02/2017 12:50 PM

## 2017-06-02 NOTE — Assessment & Plan Note (Signed)
She has chronic type 2 diabetes with peripheral neuropathy I plan to reduce the dose of Taxol and premedication dexamethasone in the future

## 2017-06-02 NOTE — Patient Instructions (Signed)

## 2017-06-02 NOTE — Assessment & Plan Note (Addendum)
She has severe iron deficiency anemia likely due to chronic bleeding I recommend blood transfusion due for significant symptomatic anemia She will also be prescribed intravenous iron replacement therapy in the future We discussed some of the risks, benefits, and alternatives of blood transfusions. The patient is symptomatic from anemia and the hemoglobin level is critically low.  Some of the side-effects to be expected including risks of transfusion reactions, chills, infection, syndrome of volume overload and risk of hospitalization from various reasons and the patient is willing to proceed and went ahead to sign consent today. I will proceed with 1 unit of blood transfusion

## 2017-06-02 NOTE — Progress Notes (Signed)
  Oncology Nurse Navigator Documentation  Navigator Location: [facility] (06/02/17 1138)   )Navigator Encounter Type: Initial MedOnc (06/02/17 1138)     Confirmed Diagnosis Date: 05/15/17 (06/02/17 1138)               Patient Visit Type: MedOnc (06/02/17 1138)   Barriers/Navigation Needs: Education;Coordination of Care (06/02/17 1138) Education: Preparing for Upcoming Surgery/ Treatment (06/02/17 1138) Interventions: Coordination of Care (06/02/17 1138)   Coordination of Care: Appts (06/02/17 1138)        Acuity: Level 2 (06/02/17 1138)   Acuity Level 2: Assistance expediting appointments (06/02/17 1138)     Time Spent with Patient: 15 (06/02/17 1138)

## 2017-06-02 NOTE — Assessment & Plan Note (Signed)
PET CT scan is pending I will see her back to review test results The patient understood that the purpose of chemotherapy is palliative in nature I recommend PICC line placement due to risk of infection with open wound She is scheduled for chemo education class and see me back before starting her on treatment I recommend combination chemotherapy with carboplatin and Taxol with reduced dose due to her debility and baseline peripheral neuropathy

## 2017-06-03 ENCOUNTER — Ambulatory Visit (HOSPITAL_COMMUNITY)
Admission: RE | Admit: 2017-06-03 | Discharge: 2017-06-03 | Disposition: A | Discharge status: Home / Self Care | Payer: PPO | Source: Ambulatory Visit | Attending: Family Medicine | Admitting: Family Medicine

## 2017-06-03 ENCOUNTER — Other Ambulatory Visit: Payer: Self-pay | Admitting: *Deleted

## 2017-06-03 ENCOUNTER — Telehealth: Payer: Self-pay | Admitting: *Deleted

## 2017-06-03 ENCOUNTER — Inpatient Hospital Stay: Payer: PPO

## 2017-06-03 ENCOUNTER — Encounter: Payer: Self-pay | Admitting: Hematology and Oncology

## 2017-06-03 ENCOUNTER — Other Ambulatory Visit: Payer: Self-pay | Admitting: Hematology and Oncology

## 2017-06-03 ENCOUNTER — Inpatient Hospital Stay (HOSPITAL_BASED_OUTPATIENT_CLINIC_OR_DEPARTMENT_OTHER): Payer: PPO | Admitting: Hematology and Oncology

## 2017-06-03 ENCOUNTER — Encounter: Payer: Self-pay | Admitting: Oncology

## 2017-06-03 VITALS — BP 135/54 | HR 76 | Temp 97.6°F | Resp 18 | Ht 62.0 in | Wt 192.9 lb

## 2017-06-03 DIAGNOSIS — C519 Malignant neoplasm of vulva, unspecified: Secondary | ICD-10-CM

## 2017-06-03 DIAGNOSIS — E1140 Type 2 diabetes mellitus with diabetic neuropathy, unspecified: Secondary | ICD-10-CM | POA: Diagnosis not present

## 2017-06-03 DIAGNOSIS — D509 Iron deficiency anemia, unspecified: Secondary | ICD-10-CM | POA: Insufficient documentation

## 2017-06-03 DIAGNOSIS — Z5111 Encounter for antineoplastic chemotherapy: Secondary | ICD-10-CM | POA: Diagnosis not present

## 2017-06-03 LAB — SAMPLE TO BLOOD BANK

## 2017-06-03 LAB — CBC WITH DIFFERENTIAL/PLATELET
BASOS ABS: 0 10*3/uL (ref 0.0–0.1)
BASOS PCT: 1 %
Eosinophils Absolute: 0.1 10*3/uL (ref 0.0–0.5)
Eosinophils Relative: 1 %
HEMATOCRIT: 27.4 % — AB (ref 34.8–46.6)
Hemoglobin: 9 g/dL — ABNORMAL LOW (ref 11.6–15.9)
Lymphocytes Relative: 17 %
Lymphs Abs: 0.9 10*3/uL (ref 0.9–3.3)
MCH: 27.4 pg (ref 25.1–34.0)
MCHC: 32.6 g/dL (ref 31.5–36.0)
MCV: 83.9 fL (ref 79.5–101.0)
MONO ABS: 0.5 10*3/uL (ref 0.1–0.9)
Monocytes Relative: 9 %
NEUTROS ABS: 3.9 10*3/uL (ref 1.5–6.5)
Neutrophils Relative %: 72 %
PLATELETS: 239 10*3/uL (ref 145–400)
RBC: 3.27 MIL/uL — ABNORMAL LOW (ref 3.70–5.45)
RDW: 14.9 % — AB (ref 11.2–14.5)
WBC: 5.5 10*3/uL (ref 3.9–10.3)

## 2017-06-03 LAB — PREPARE RBC (CROSSMATCH)

## 2017-06-03 MED ORDER — ONDANSETRON HCL 8 MG PO TABS: 8.0000 mg | ORAL_TABLET | Freq: Three times a day (TID) | ORAL | 3 refills | Status: DC | PRN

## 2017-06-03 MED ORDER — ACETAMINOPHEN 325 MG PO TABS
650.0000 mg | ORAL_TABLET | Freq: Once | ORAL | Status: AC
  Administered 2017-06-03: 650 mg via ORAL
  Filled 2017-06-03: qty 2

## 2017-06-03 MED ORDER — DIPHENHYDRAMINE HCL 25 MG PO CAPS
25.0000 mg | ORAL_CAPSULE | Freq: Once | ORAL | Status: AC
  Administered 2017-06-03: 25 mg via ORAL
  Filled 2017-06-03: qty 1

## 2017-06-03 MED ORDER — PROCHLORPERAZINE MALEATE 10 MG PO TABS: 10.0000 mg | ORAL_TABLET | Freq: Four times a day (QID) | ORAL | 1 refills | Status: DC | PRN

## 2017-06-03 MED ORDER — PROCHLORPERAZINE MALEATE 10 MG PO TABS: 10.0000 mg | ORAL_TABLET | Freq: Four times a day (QID) | ORAL | 0 refills | Status: DC | PRN

## 2017-06-03 MED ORDER — SODIUM CHLORIDE FLUSH 0.9 % IV SOLN: 10.0000 mL | Freq: Every day | INTRAVENOUS | 9 refills | Status: DC

## 2017-06-03 MED ORDER — DEXAMETHASONE 4 MG PO TABS: ORAL_TABLET | ORAL | 0 refills | Status: DC

## 2017-06-03 MED ORDER — SODIUM CHLORIDE 0.9 % IV SOLN: Freq: Once | INTRAVENOUS | Status: DC

## 2017-06-03 MED ORDER — BLOOD GLUCOSE MONITOR KIT: PACK | 0 refills | Status: AC

## 2017-06-03 MED ORDER — ONDANSETRON HCL 8 MG PO TABS: 8.0000 mg | ORAL_TABLET | Freq: Two times a day (BID) | ORAL | 1 refills | Status: DC | PRN

## 2017-06-03 MED FILL — NORMAL SALINE FLUSH SYRINGE: 0.9 | 30 days supply | Qty: 300 | Fill #0

## 2017-06-03 MED FILL — DEXAMETHASONE 4 MG TABLET: 4 | 84 days supply | Qty: 24 | Fill #0

## 2017-06-03 NOTE — Discharge Instructions (Signed)

## 2017-06-03 NOTE — Assessment & Plan Note (Signed)
We reviewed the NCCN guidelines We discussed the role of chemotherapy. The intent is of palliative intent.  We discussed some of the risks, benefits, side-effects of carboplatin & Taxol. Treatment is intravenous, every 3 weeks x 6 cycles  Some of the short term side-effects included, though not limited to, including weight loss, life threatening infections, risk of allergic reactions, need for transfusions of blood products, nausea, vomiting, change in bowel habits, loss of hair, admission to hospital for various reasons, and risks of death.   Long term side-effects are also discussed including risks of infertility, permanent damage to nerve function, hearing loss, chronic fatigue, kidney damage with possibility needing hemodialysis, and rare secondary malignancy including bone marrow disorders.  The patient is aware that the response rates discussed earlier is not guaranteed.  After a long discussion, patient made an informed decision to proceed with the prescribed plan of care.   Patient education material was dispensed. We discussed premedication with dexamethasone before chemotherapy. I am scheduling chemo class and PICC line placement Due to her frail status, I plan to see her every week for toxicity review and transfusion support as needed

## 2017-06-03 NOTE — Telephone Encounter (Signed)
Ok, doubt they'll covdr

## 2017-06-03 NOTE — Telephone Encounter (Signed)
Prescription sent to [facility]. Pt notified.

## 2017-06-03 NOTE — Assessment & Plan Note (Signed)
She has chronic type 2 diabetes with peripheral neuropathy I plan to reduce the dose of Taxol and premedication dexamethasone in the future

## 2017-06-03 NOTE — Progress Notes (Signed)
[facility] OFFICE PROGRESS NOTE  Patient Care Team: Melissa Kirschner, MD as PCP - General (Family Medicine)  ASSESSMENT & PLAN:  Vulva cancer [facility]) We reviewed the NCCN guidelines We discussed the role of chemotherapy. The intent is of palliative intent.  We discussed some of the risks, benefits, side-effects of carboplatin & Taxol. Treatment is intravenous, every 3 weeks x 6 cycles  Some of the short term side-effects included, though not limited to, including weight loss, life threatening infections, risk of allergic reactions, need for transfusions of blood products, nausea, vomiting, change in bowel habits, loss of hair, admission to hospital for various reasons, and risks of death.   Long term side-effects are also discussed including risks of infertility, permanent damage to nerve function, hearing loss, chronic fatigue, kidney damage with possibility needing hemodialysis, and rare secondary malignancy including bone marrow disorders.  The patient is aware that the response rates discussed earlier is not guaranteed.  After a long discussion, patient made an informed decision to proceed with the prescribed plan of care.   Patient education material was dispensed. We discussed premedication with dexamethasone before chemotherapy. I am scheduling chemo class and PICC line placement Due to her frail status, I plan to see her every week for toxicity review and transfusion support as needed    Iron deficiency anemia We discussed some of the risks, benefits, and alternatives of blood transfusions. The patient is symptomatic from anemia and the hemoglobin level is critically low.  Some of the side-effects to be expected including risks of transfusion reactions, chills, infection, syndrome of volume overload and risk of hospitalization from various reasons and the patient is willing to proceed and went ahead to sign consent today. She has received a second unit of blood this  morning. Repeat hemoglobin is satisfactory at 9 I plan to give her intravenous iron in the future  Diabetes mellitus with diabetic neuropathy, without long-term current use of insulin (HCC) She has chronic type 2 diabetes with peripheral neuropathy I plan to reduce the dose of Taxol and premedication dexamethasone in the future   Orders Placed This Encounter  Procedures  . CBC with Differential (Cancer Center Only)    Standing Status:   Standing    Number of Occurrences:   20    Standing Expiration Date:   06/04/2018  . CMP ([facility] only)    Standing Status:   Standing    Number of Occurrences:   20    Standing Expiration Date:   06/04/2018    INTERVAL HISTORY: Please see below for problem oriented charting. She returns for chemotherapy consent with her son She tolerated blood transfusion well She has more energy The patient denies any recent signs or symptoms of bleeding such as spontaneous epistaxis, hematuria or hematochezia.  SUMMARY OF ONCOLOGIC HISTORY:   Vulva cancer ([place])   02/22/2016 Pathology Results    Labium, biopsy, right mid - INVASIVE SQUAMOUS CELL CARCINOMA.      02/23/2016 Imaging    Ct abdomen and pelvis Mild asymmetric soft tissue thickening in the right vulvar/perineal region, likely corresponding to the patient's known vulvar cancer, poorly evaluated on CT.  No evidence of metastatic disease.  Status post hysterectomy.      02/27/2016 Pathology Results    1. Lymph nodes, regional resection, right inguinal - METASTATIC CARCINOMA IN 1 OF 6 LYMPH NODES (1/6). 2. Vulva, excision, right - INVASIVE KERATINIZING SQUAMOUS CELL CARCINOMA, MODERATELY DIFFERENTIATED, SPANNING 2.1 CM WITH A DEPTH OF INVASION  OF 0.6 CM. - THE SURGICAL RESECTION MARGIN ARE NEGATIVE FOR CARCINOMA. - DIFFUSE LICHEN SCLEROSUS ET ATROPHICUS. - SEE ONCOLOGY TABLE BELOW. Microscopic Comment 2. VULVA Specimen: Right vulvectomy. Procedure: Resection Lymph node sampling  performed: Yes. Tumor site: Right vulva Tumor focality: Carcinoma is unifocal. Maximum tumor size (cm): 2.1 cm (gross measurement). Histologic type: Keratinizing squmaous cell carcinoma. Grade: Moderately differentiated. Depth of stromal invasion (mm): 6 mm (glass slide measurement) Margins: Negative for carcinoma. Distance of carcinoma from nearest margin: 0.6 cm to the 3 o'clock margin. Lymph - Vascular invasion: Not identified. Lymph nodes: number examined: 6 Number positive: 1 size of metastatic focus: 0.3 cm (glass slide measurement) extracapsular extension: Not identified. TNM code: pT1b, pN1a FIGO Stage (based on pathologic findings, needs clinical correlation): IIIA Comment: Grossly, there is a 2.1 cm sessile papular lesion which on histologic evaluation reveals invasive keratinizing squamous cell carcinoma invading to a depth of 0.6 cm as measured from a glass slide. In addition, there is diffuse lichen sclerosus et atrophicus. The surgical resection margins are negative for invasive caricnoma. Additional studies can be performed upon clinical request.      02/27/2016 Surgery    Surgery: Right radical partial vulvectomy, right inguinal lymph node dissection  Surgeons:  Imagene Gurney A. Alycia Rossetti, MD; Lahoma Crocker, MD   Pathology: Right vulva with resection margin with suture at 12:00. Right inguinal nodes.   Operative findings: 1.5 cm fungating erosive lesion of the right vulva approximately 2 cm from the urethra. Evidence of lichen sclerosis throughout the entire vulvar. No other ulcerative lesion suspicious for vulvar carcinoma. Shotty palpable lymphadenopathy in the right inguinal space.        04/01/2016 Imaging    Ct chest 1. Multiple pulmonary nodules as detailed above, generally in the 4 mm and less average diameter range although with 1 right lower lobe subpleural nodule measuring about 8 mm in diameter. These likely warrant surveillance. 2. Hypodense right thyroid  nodule, 1.1 cm in long axis. Consider further evaluation with thyroid ultrasound. If patient is clinically hyperthyroid, consider nuclear medicine thyroid uptake and scan. 3. Stable hypodense lateral splenic lesion, likely a cyst. 4. Aortic and coronary atherosclerosis.       04/02/2016 Pathology Results    Labium, biopsy, left minora INVASIVE SQUAMOUS CELL CARCINOMA      04/09/2016 Pathology Results    1. Vulva, vulvectomy, left - INVASIVE SQUAMOUS CELL CARCINOMA, NON-KERATINIZING, SPANNING 2.4 CM IN WIDTH X 0.4 CM IN DEPTH. - CARCINOMA IS BROADLY PRESENT AT THE 6 O'CLOCK MARGIN OF SPECIMEN #1. - SEE ONCOLOGY TABLE BELOW. 2. Vulva, excision, lateral margin - SQUAMOUS LINED EPITHELIUM WITH LICHEN SCLEROSUS ET ATROPHICUS. 3. Vulva, excision, medial margin - SQUAMOUS LINED EPITHELIUM WITH LICHEN SCLEROSUS ET ATROPHICUS. Microscopic Comment 1. VULVA Specimen: Left vulva. Procedure: Left vulvectomy with additional lateral and medial margins. Lymph node sampling performed: Not on current case. Tumor site: Left vulva. Tumor focality: Unifocal. Maximum tumor size (cm): 2.4 cm in width (gross measurement) Histologic type: Squamous cell carcinoma, non-keratinizing. Grade: High grade. Depth of stromal invasion (mm): 0.4 cm (4 mm), glass slide measurement. Margins: Invasive carcinoma broadly present at the 6 o'clock margin of specimen #1. Lymph - Vascular invasion: Not identified. Lymph nodes: None examined. TNM code: pT1b, pNX FIGO Stage (based on pathologic findings, needs clinical correlation): IB      04/09/2016 Surgery    Surgery: Partial radical left vulvectomy  Surgeons:  Donaciano Eva, MD  Pathology: 1/ left vulva with marking stitch at 12 o'clock  anterior, 2/ lateral (thigh) margin with stitch at true new lateral margin. 3/ medial (vaginal) margin with marking stitch at true new medial margin. Operative findings: 2cm left mid labial nodular lesion         05/03/2016 Imaging    Ct chest 1.  No evidence of acute pulmonary embolus. 2. Calcified coronary artery atherosclerosis. Negative visible aorta. 3. Pulmonary atelectasis with no other acute pulmonary process. The small subpleural pulmonary nodules seen by CT last month are stable.       06/05/2016 - 07/25/2016 Radiation Therapy    Radiation treatment dates:   06/05/16 - 07/25/16  Site/dose:   Pelvis treated to 45 Gy in 25 fractions, then vulvar region Boosted an additional 18 Gy in 9 fractions.  Beams/energy:   Pelvis:  IMRT  //  6X                              Boost:  IMRT  //  6X       09/30/2016 Pathology Results    Vulva, biopsy, anterior vulva INVASIVE SQUAMOUS CELL CARCINOMA      10/07/2016 Imaging    Ct chest 1. Stable bilateral pulmonary nodules, likely benign but will require surveillance. Recommend followup noncontrast chest CT in 6-12 months. 2. No mediastinal or hilar mass or adenopathy. 3. Stable coronary artery calcifications.  Aortic Atherosclerosis (ICD10-I70.0).      10/17/2016 Pathology Results    Vulva, vulvectomy, anterior - INVASIVE SQUAMOUS CELL CARCINOMA, SPANNING 2.7 CM AND EXTENDING TO A DEPTH OF 0.3 CM. - RESECTION MARGINS ARE NEGATIVE. - SEE ONCOLOGY TABLE. Microscopic Comment VULVA Specimen: Anterior vulva. Procedure: Partial anterior vulvectomy. Lymph node sampling performed: None. Tumor site: Anterior vulva. Tumor focality: Unifocal. Maximum tumor size (cm): 2.7 cm. Histologic type: Squamous cell carcinoma, non-keratinizing. Grade: G2, moderately differentiated. Depth of stromal invasion (mm): 3 mm. Margins: Negative Distance of carcinoma from nearest margin: Invasive tumor is 1 mm from urethral margin. Lymph - Vascular invasion: Not identified. Lymph nodes: None. TNM code: pT1b, pNX FIGO Stage (based on pathologic findings, needs clinical correlation): IB      10/17/2016 Surgery    Surgery: Partial radical anterior  vulvectomy  Surgeons:  Donaciano Eva, MD  Pathology: anterior vulva with marking stitch at anterior clitoral margin o'clock  Operative findings: 3cm raised vulvar cancer (leukoplakia) at midline and right vulva extending to within 0.5cm of anterior urethral meatus        02/17/2017 Pathology Results    Vulva, biopsy - PARAKERATOSIS AND ACUTE INFLAMMATION. - PAS IS NEGATIVE FOR FUNGAL ORGANISMS. - NO DYSPLASIA OR MALIGNANCY.      05/01/2017 Imaging    Ct abdomen and pelvis New 4 x 3 cm rim enhancing lesion central air bubbles in the right vulvar region, which could represent locally recurrent tumor with necrosis or abscess.  No evidence of metastatic disease within the abdomen or pelvis.      05/15/2017 Pathology Results    1. Labium, biopsy, right posterior labia majora - PARAKERATOSIS. - NO DYSPLASIA OR MALIGNANCY. 2. Vulva, excision, anterior right mons pubis lesion - INVASIVE MODERATE TO POORLY DIFFERENTIATED SQUAMOUS CELL CARCINOMA. - ASSOCIATED ULCER, NECROSIS AND ACUTE INFLAMMATION. - SEE COMMENT. 3. Labium, biopsy, left mid labia majora - POORLY DIFFERENTIATED SQUAMOUS CELL CARCINOMA. - SEE COMMENT. Microscopic Comment 2. The specimen is disrupted hampering assessment of margin orientation and size, however, carcinoma involves almost the entire specimen (thus size estimated to be  at least 3.1 cm with depth of 1 cm) and extends to the majority of the presumed lateral and deep margins. There is perineural invasion. 3. The carcinoma is largely beneath the epidermis and extends to the deep and lateral edges of the biopsy.      05/15/2017 Surgery    Surgery: debridement of anterior vulvar abscess, vulvar biopsies.   Surgeons:  Donaciano Eva, MD  Pathology: right posterior labia majora, anterior mons pubis, left mid labia majora  Operative findings: 6cm cavitating anterior mons pubis indurated lesion. It is approximately 3.5cm from the urethral meatus  to inferior aspect of mass. Two indurated 1-2cm nodular areas (one on the posterior right labia majora and one on the mid left labia majora). No periurethral lesions.        06/02/2017 PET scan    Stable hypermetabolic soft tissue density in right vulva, which may be due to postoperative change from recent excision, although residual malignancy and infection cannot be excluded.  No evidence of local or distant metastatic disease.       REVIEW OF SYSTEMS:   Constitutional: Denies fevers, chills or abnormal weight loss Eyes: Denies blurriness of vision Ears, nose, mouth, throat, and face: Denies mucositis or sore throat Respiratory: Denies cough, dyspnea or wheezes Cardiovascular: Denies palpitation, chest discomfort or lower extremity swelling Gastrointestinal:  Denies nausea, heartburn or change in bowel habits Skin: Denies abnormal skin rashes Lymphatics: Denies new lymphadenopathy or easy bruising Neurological:Denies numbness, tingling or new weaknesses Behavioral/Psych: Mood is stable, no new changes  All other systems were reviewed with the patient and are negative.  I have reviewed the past medical history, past surgical history, social history and family history with the patient and they are unchanged from previous note.  ALLERGIES:  is allergic to oxycodone; celebrex [celecoxib]; doxycycline; iron; lodine [etodolac]; nabumetone; tetracycline; and xarelto [rivaroxaban].  MEDICATIONS:  Current Outpatient Medications  Medication Sig Dispense Refill  . dexamethasone (DECADRON) 4 MG tablet Take 3 tabs the night before and 3 tabs the morning of chemotherapy, every 3 weeks, with food 60 tablet 0  . ferrous sulfate 325 (65 FE) MG tablet Take 1 tablet (325 mg total) by mouth daily with breakfast. 30 tablet 1  . gabapentin (NEURONTIN) 300 MG capsule Take 1 capsule (300 mg total) by mouth 3 (three) times daily. (Patient taking differently: Take 300 mg by mouth 3 (three) times daily. )  90 capsule 2  . HYDROcodone-acetaminophen (NORCO) 5-325 MG tablet One tablet Q 4 hours prn. Caution drowsiness. 60 tablet 0  . losartan (COZAAR) 50 MG tablet TAKE ONE TABLET BY MOUTH ONCE DAILY. (Patient taking differently: TAKE ONE TABLET BY MOUTH ONCE DAILY.--- takes in am) 90 tablet 1  . metFORMIN (GLUCOPHAGE) 500 MG tablet TAKE (1) TABLET BY MOUTH TWICE DAILY. 180 tablet 0  . ondansetron (ZOFRAN) 8 MG tablet Take 1 tablet (8 mg total) by mouth 2 (two) times daily as needed for refractory nausea / vomiting. Start on day 3 after chemo. 30 tablet 1  . ondansetron (ZOFRAN) 8 MG tablet Take 1 tablet (8 mg total) by mouth every 8 (eight) hours as needed for nausea. 30 tablet 3  . oxyCODONE-acetaminophen (PERCOCET) 10-325 MG tablet Take 1 tablet by mouth every 4 (four) hours as needed for pain. (Patient not taking: Reported on 05/27/2017) 30 tablet 0  . potassium chloride SA (K-DUR,KLOR-CON) 20 MEQ tablet TAKE 1 TABLET BY MOUTH ONCE A DAY. (Patient taking differently: TAKE 1 TABLET BY MOUTH ONCE A DAY.  ---  takes in am) 90 tablet 0  . prochlorperazine (COMPAZINE) 10 MG tablet Take 1 tablet (10 mg total) by mouth every 6 (six) hours as needed (Nausea or vomiting). 30 tablet 1  . prochlorperazine (COMPAZINE) 10 MG tablet Take 1 tablet (10 mg total) by mouth every 6 (six) hours as needed for nausea or vomiting. 30 tablet 0  . sodium chloride flush 0.9 % SOLN injection Inject 10 mLs into the vein daily. 30 Syringe 9   No current facility-administered medications for this visit.    Facility-Administered Medications Ordered in Other Visits  Medication Dose Route Frequency Provider Last Rate Last Dose  . 0.9 %  sodium chloride infusion   Intravenous Once Heath Lark, MD        PHYSICAL EXAMINATION: ECOG PERFORMANCE STATUS: 1 - Symptomatic but completely ambulatory  Vitals:   06/03/17 1302  BP: (!) 135/54  Pulse: 76  Resp: 18  Temp: 97.6 F (36.4 C)  SpO2: 100%   Filed Weights   06/03/17 1302   Weight: 192 lb 14.4 oz (87.5 kg)    GENERAL:alert, no distress and comfortable NEURO: alert & oriented x 3 with fluent speech, no focal motor/sensory deficits  LABORATORY DATA:  I have reviewed the data as listed    Component Value Date/Time   NA 141 06/02/2017 1416   NA 144 09/30/2016 1505   K 3.9 06/02/2017 1416   K 4.4 09/30/2016 1505   CL 107 06/02/2017 1416   CO2 26 06/02/2017 1416   CO2 27 09/30/2016 1505   GLUCOSE 93 06/02/2017 1416   GLUCOSE 101 09/30/2016 1505   BUN 19 06/02/2017 1416   BUN 16.7 09/30/2016 1505   CREATININE 0.75 06/02/2017 1416   CREATININE 0.8 09/30/2016 1505   CALCIUM 9.3 06/02/2017 1416   CALCIUM 9.8 09/30/2016 1505   PROT 6.6 06/02/2017 1416   PROT 7.3 09/30/2016 1505   ALBUMIN 3.7 06/02/2017 1416   ALBUMIN 3.6 09/30/2016 1505   AST 13 06/02/2017 1416   AST 27 09/30/2016 1505   ALT 11 06/02/2017 1416   ALT 26 09/30/2016 1505   ALKPHOS 62 06/02/2017 1416   ALKPHOS 76 09/30/2016 1505   BILITOT 0.3 06/02/2017 1416   BILITOT 0.49 09/30/2016 1505   GFRNONAA >60 06/02/2017 1416   GFRAA >60 06/02/2017 1416    No results found for: SPEP, UPEP  Lab Results  Component Value Date   WBC 5.5 06/03/2017   NEUTROABS 3.9 06/03/2017   HGB 9.0 (L) 06/03/2017   HCT 27.4 (L) 06/03/2017   MCV 83.9 06/03/2017   PLT 239 06/03/2017      Chemistry      Component Value Date/Time   NA 141 06/02/2017 1416   NA 144 09/30/2016 1505   K 3.9 06/02/2017 1416   K 4.4 09/30/2016 1505   CL 107 06/02/2017 1416   CO2 26 06/02/2017 1416   CO2 27 09/30/2016 1505   BUN 19 06/02/2017 1416   BUN 16.7 09/30/2016 1505   CREATININE 0.75 06/02/2017 1416   CREATININE 0.8 09/30/2016 1505      Component Value Date/Time   CALCIUM 9.3 06/02/2017 1416   CALCIUM 9.8 09/30/2016 1505   ALKPHOS 62 06/02/2017 1416   ALKPHOS 76 09/30/2016 1505   AST 13 06/02/2017 1416   AST 27 09/30/2016 1505   ALT 11 06/02/2017 1416   ALT 26 09/30/2016 1505   BILITOT 0.3 06/02/2017  1416   BILITOT 0.49 09/30/2016 1505       RADIOGRAPHIC STUDIES:  I have reviewed imaging study with the patient and family I have personally reviewed the radiological images as listed and agreed with the findings in the report. Nm Pet Image Initial (pi) Skull Base To Thigh  Result Date: 06/02/2017 CLINICAL DATA:  Initial treatment strategy for recurrent vulvar squamous cell carcinoma. EXAM: NUCLEAR MEDICINE PET SKULL BASE TO THIGH TECHNIQUE: 9.5 mCi F-18 FDG was injected intravenously. Full-ring PET imaging was performed from the skull base to thigh after the radiotracer. CT data was obtained and used for attenuation correction and anatomic localization. Fasting blood glucose: 110 mg/dl COMPARISON:  AP CT on 04/30/2017 and chest CT on 10/07/2016 FINDINGS: (Mediastinal blood pool activity: SUV max = 2.6) NECK: No hypermetabolic lymph nodes. A 1.3 cm low-attenuation right thyroid lobe nodule is seen which is slightly increased in size from 11 mm previously. This has hypermetabolic activity, with SUV max 8.4. Incidental CT findings:  None. CHEST: No hypermetabolic masses or lymphadenopathy. No suspicious pulmonary nodules seen on CT images. Incidental CT findings:  None. ABDOMEN/PELVIS: No abnormal hypermetabolic activity within the liver, pancreas, adrenal glands, or spleen. No hypermetabolic lymph nodes in the abdomen or pelvis. Hypermetabolic soft tissue density is again seen in the right vulva which measures 4.1 x 2.7 cm, and shows central cavity. This is unchanged in size and shows hypermetabolic activity with SUV max of 11.5. This could be due to postoperative change from recent surgical excision, although residual malignancy and infection cannot be excluded. Incidental CT findings: Prior hysterectomy again noted as well as left hip prosthesis. SKELETON: No focal hypermetabolic bone lesions to suggest skeletal metastasis. Incidental CT findings:  None. IMPRESSION: Stable hypermetabolic soft tissue  density in right vulva, which may be due to postoperative change from recent excision, although residual malignancy and infection cannot be excluded. No evidence of local or distant metastatic disease. Electronically Signed   By: Earle Gell M.D.   On: 06/02/2017 15:24    All questions were answered. The patient knows to call the clinic with any problems, questions or concerns. No barriers to learning was detected.  I spent 25 minutes counseling the patient face to face. The total time spent in the appointment was 30 minutes and more than 50% was on counseling and review of test results  Heath Lark, MD 06/03/2017 1:04 PM

## 2017-06-03 NOTE — Progress Notes (Signed)
PATIENT CARE CENTER NOTE  Diagnosis: Iron Deficiency Anemia    Provider: Dr. Alvy Bimler   Procedure: 1 unit PRBC   Note: Patient received 1 unit of blood. Pre-medications given before transfusion. Patient tolerated transfusion well with no adverse reaction. Discharge instructions given to patient. Patient alert, oriented and ambulatory to wheelchair at discharge.

## 2017-06-03 NOTE — Telephone Encounter (Signed)
Patient called stating she is at a doctor in [place] currently and for the test she will be having done that doctor wants her to check her blood sugar several times a day, patient is requesting a monitor and strips. Please advise [phone] and [phone]

## 2017-06-03 NOTE — Assessment & Plan Note (Addendum)
We discussed some of the risks, benefits, and alternatives of blood transfusions. The patient is symptomatic from anemia and the hemoglobin level is critically low.  Some of the side-effects to be expected including risks of transfusion reactions, chills, infection, syndrome of volume overload and risk of hospitalization from various reasons and the patient is willing to proceed and went ahead to sign consent today. She has received a second unit of blood this morning. Repeat hemoglobin is satisfactory at 9 I plan to give her intravenous iron in the future

## 2017-06-03 NOTE — Telephone Encounter (Signed)
Pt states oncologist wants her to test sugar three times a day before starting chemo. Pt is not on insulin so usually insurance will only cover once daily. Pt wants [place] to send in script stating testing three times daily and see if insurance will cover. Is this ok. [facility]

## 2017-06-04 ENCOUNTER — Telehealth: Payer: Self-pay | Admitting: Gynecologic Oncology

## 2017-06-04 ENCOUNTER — Telehealth: Payer: Self-pay | Admitting: Oncology

## 2017-06-04 LAB — TYPE AND SCREEN
ABO/RH(D): B POS
Antibody Screen: NEGATIVE
Unit division: 0
Unit division: 0

## 2017-06-04 LAB — BPAM RBC
Blood Product Expiration Date: 201906112359
Blood Product Expiration Date: 201906112359
ISSUE DATE / TIME: 201905131612
ISSUE DATE / TIME: 201905140857
UNIT TYPE AND RH: 7300
Unit Type and Rh: 7300

## 2017-06-04 NOTE — Telephone Encounter (Signed)
[name] called and said she is having second thoughts about starting chemotherapy on Friday.  She is thinking it is to soon to start and doesn't know anything about chemotherapy.  Advised her that going to the chemo class today or tomorrow would really help.  [name] said she would work on getting transportation and will call back.

## 2017-06-04 NOTE — Telephone Encounter (Signed)
Returned call to patient.  Left message for her to call the office at her convenience to discuss her questions.

## 2017-06-04 NOTE — Telephone Encounter (Signed)
PLease keep me posted

## 2017-06-05 ENCOUNTER — Other Ambulatory Visit: Payer: Self-pay | Admitting: Student

## 2017-06-05 NOTE — Telephone Encounter (Addendum)
[name] called back and said she called the cancer center and canceled her chemotherapy appointments for tomorrow.  She said that she would like to go to a chemotherapy education class.  Appointment scheduled for 2 pm on Friday, 06/06/17.  She verbalized understanding and agreement.

## 2017-06-05 NOTE — Telephone Encounter (Addendum)
Patient advised of message below. She would like to cancel chemotherapy for tomorrow and reschedule for 5/24. She would like to keep her chemo eduacaton at 2:00 and PICC placement at 3:30 on 5/17.

## 2017-06-05 NOTE — Telephone Encounter (Signed)
Pls remind her if she wants to cancel tomorrow, the next available chemo is on 5/24. I see her on 5/23 for IV iron If she is in agreement, let me know and I will have to reschedule her chemo appt

## 2017-06-05 NOTE — Telephone Encounter (Signed)
I have sent scheduling msg to schedule chemo on 5/24

## 2017-06-06 ENCOUNTER — Encounter: Payer: Self-pay | Admitting: Oncology

## 2017-06-06 ENCOUNTER — Ambulatory Visit: Payer: PPO | Admitting: Hematology and Oncology

## 2017-06-06 ENCOUNTER — Ambulatory Visit (HOSPITAL_COMMUNITY)
Admission: RE | Admit: 2017-06-06 | Discharge: 2017-06-06 | Disposition: A | Discharge status: Home / Self Care | Payer: PPO | Source: Ambulatory Visit | Attending: Hematology and Oncology | Admitting: Hematology and Oncology

## 2017-06-06 ENCOUNTER — Telehealth: Payer: Self-pay | Admitting: Hematology and Oncology

## 2017-06-06 ENCOUNTER — Other Ambulatory Visit: Payer: Self-pay | Admitting: Hematology and Oncology

## 2017-06-06 ENCOUNTER — Inpatient Hospital Stay: Payer: PPO

## 2017-06-06 DIAGNOSIS — Z885 Allergy status to narcotic agent status: Secondary | ICD-10-CM | POA: Diagnosis not present

## 2017-06-06 DIAGNOSIS — Z79899 Other long term (current) drug therapy: Secondary | ICD-10-CM | POA: Insufficient documentation

## 2017-06-06 DIAGNOSIS — Z9889 Other specified postprocedural states: Secondary | ICD-10-CM | POA: Diagnosis not present

## 2017-06-06 DIAGNOSIS — C519 Malignant neoplasm of vulva, unspecified: Secondary | ICD-10-CM

## 2017-06-06 DIAGNOSIS — E1140 Type 2 diabetes mellitus with diabetic neuropathy, unspecified: Secondary | ICD-10-CM | POA: Insufficient documentation

## 2017-06-06 DIAGNOSIS — Z888 Allergy status to other drugs, medicaments and biological substances status: Secondary | ICD-10-CM | POA: Insufficient documentation

## 2017-06-06 DIAGNOSIS — D509 Iron deficiency anemia, unspecified: Secondary | ICD-10-CM | POA: Insufficient documentation

## 2017-06-06 DIAGNOSIS — Z7984 Long term (current) use of oral hypoglycemic drugs: Secondary | ICD-10-CM | POA: Insufficient documentation

## 2017-06-06 DIAGNOSIS — Z452 Encounter for adjustment and management of vascular access device: Secondary | ICD-10-CM | POA: Diagnosis not present

## 2017-06-06 DIAGNOSIS — Z881 Allergy status to other antibiotic agents status: Secondary | ICD-10-CM | POA: Insufficient documentation

## 2017-06-06 MED ORDER — HEPARIN SOD (PORK) LOCK FLUSH 100 UNIT/ML IV SOLN
INTRAVENOUS | Status: AC
  Filled 2017-06-06: qty 5

## 2017-06-06 MED ORDER — LIDOCAINE HCL 1 % IJ SOLN
INTRAMUSCULAR | Status: AC
  Filled 2017-06-06: qty 20

## 2017-06-06 NOTE — Telephone Encounter (Signed)
Spoke to patient regarding upcoming may appointment updates per 5/16 sch message

## 2017-06-06 NOTE — Procedures (Signed)
Right basilic vein double-lumen PICC placed.  Length 42 cm.  Tip SVC/right atrial junction.  Medication used-1% lidocaine to skin and subcutaneous tissue.  No immediate complications.  Okay to use.

## 2017-06-09 ENCOUNTER — Encounter: Payer: Self-pay | Admitting: Gynecologic Oncology

## 2017-06-09 NOTE — Progress Notes (Signed)
Gynecologic Oncology Multi-Disciplinary Disposition Conference Note  Date of the Conference: Jun 09, 2017  Patient Name: [name]  Referring Provider: Dr. Elonda Husky Primary GYN Oncologist: Dr. Everitt Amber  Stage/Disposition:  Recurrent vulvar squamous cell carcinoma. Disposition is to palliative chemotherapy.   This Multidisciplinary conference took place involving physicians from [place], [place], Radiation Oncology, Pathology, Radiology along with the Gynecologic Oncology Nurse Practitioner and RN.  Comprehensive assessment of the patient's malignancy, staging, need for surgery, chemotherapy, radiation therapy, and need for further testing were reviewed. Supportive measures, both inpatient and following discharge were also discussed. The recommended plan of care is documented. Greater than 35 minutes were spent correlating and coordinating this patient's care.

## 2017-06-10 ENCOUNTER — Ambulatory Visit: Payer: PPO | Admitting: Nurse Practitioner

## 2017-06-12 ENCOUNTER — Inpatient Hospital Stay: Payer: PPO

## 2017-06-12 ENCOUNTER — Telehealth: Payer: Self-pay | Admitting: Oncology

## 2017-06-12 ENCOUNTER — Encounter: Payer: Self-pay | Admitting: Oncology

## 2017-06-12 ENCOUNTER — Inpatient Hospital Stay (HOSPITAL_BASED_OUTPATIENT_CLINIC_OR_DEPARTMENT_OTHER): Payer: PPO | Admitting: Hematology and Oncology

## 2017-06-12 ENCOUNTER — Encounter: Payer: Self-pay | Admitting: Hematology and Oncology

## 2017-06-12 VITALS — BP 120/58 | HR 94 | Temp 98.4°F | Resp 17

## 2017-06-12 DIAGNOSIS — R102 Pelvic and perineal pain: Secondary | ICD-10-CM

## 2017-06-12 DIAGNOSIS — C519 Malignant neoplasm of vulva, unspecified: Secondary | ICD-10-CM | POA: Diagnosis not present

## 2017-06-12 DIAGNOSIS — D509 Iron deficiency anemia, unspecified: Secondary | ICD-10-CM

## 2017-06-12 DIAGNOSIS — Z95828 Presence of other vascular implants and grafts: Secondary | ICD-10-CM | POA: Insufficient documentation

## 2017-06-12 DIAGNOSIS — Z5111 Encounter for antineoplastic chemotherapy: Secondary | ICD-10-CM | POA: Diagnosis not present

## 2017-06-12 LAB — CBC WITH DIFFERENTIAL (CANCER CENTER ONLY)
Basophils Absolute: 0 10*3/uL (ref 0.0–0.1)
Basophils Relative: 0 %
EOS PCT: 2 %
Eosinophils Absolute: 0.1 10*3/uL (ref 0.0–0.5)
HCT: 34.1 % — ABNORMAL LOW (ref 34.8–46.6)
Hemoglobin: 10.2 g/dL — ABNORMAL LOW (ref 11.6–15.9)
LYMPHS ABS: 0.5 10*3/uL — AB (ref 0.9–3.3)
Lymphocytes Relative: 11 %
MCH: 26.5 pg (ref 25.1–34.0)
MCHC: 29.9 g/dL — AB (ref 31.5–36.0)
MCV: 88.6 fL (ref 79.5–101.0)
MONOS PCT: 12 %
Monocytes Absolute: 0.6 10*3/uL (ref 0.1–0.9)
Neutro Abs: 3.8 10*3/uL (ref 1.5–6.5)
Neutrophils Relative %: 75 %
PLATELETS: 190 10*3/uL (ref 145–400)
RBC: 3.85 MIL/uL (ref 3.70–5.45)
RDW: 14.3 % (ref 11.2–14.5)
WBC: 5.1 10*3/uL (ref 3.9–10.3)

## 2017-06-12 LAB — CMP (CANCER CENTER ONLY)
ALK PHOS: 76 U/L (ref 40–150)
ALT: 11 U/L (ref 0–55)
AST: 11 U/L (ref 5–34)
Albumin: 3.7 g/dL (ref 3.5–5.0)
Anion gap: 8 (ref 3–11)
BUN: 12 mg/dL (ref 7–26)
CALCIUM: 9.8 mg/dL (ref 8.4–10.4)
CHLORIDE: 107 mmol/L (ref 98–109)
CO2: 26 mmol/L (ref 22–29)
CREATININE: 0.77 mg/dL (ref 0.60–1.10)
GFR, Est AFR Am: >60 mL/min (ref >60)
Glucose, Bld: 121 mg/dL (ref 70–140)
Potassium: 3.9 mmol/L (ref 3.5–5.1)
Sodium: 141 mmol/L (ref 136–145)
Total Bilirubin: 0.5 mg/dL (ref 0.2–1.2)
Total Protein: 7 g/dL (ref 6.4–8.3)

## 2017-06-12 LAB — SAMPLE TO BLOOD BANK

## 2017-06-12 MED ORDER — SODIUM CHLORIDE 0.9 % IV SOLN
Freq: Once | INTRAVENOUS | Status: AC
  Administered 2017-06-12: 10:00:00 via INTRAVENOUS

## 2017-06-12 MED ORDER — SODIUM CHLORIDE 0.9% FLUSH
10.0000 mL | INTRAVENOUS | Status: DC | PRN
  Administered 2017-06-12: 10 mL
  Filled 2017-06-12: qty 10

## 2017-06-12 MED ORDER — HEPARIN SOD (PORK) LOCK FLUSH 100 UNIT/ML IV SOLN
250.0000 [IU] | Freq: Once | INTRAVENOUS | Status: AC | PRN
Start: 2017-06-12 — End: 2017-06-12
  Administered 2017-06-12: 250 [IU]
  Filled 2017-06-12: qty 5

## 2017-06-12 MED ORDER — HYDROCODONE-ACETAMINOPHEN 5-325 MG PO TABS: ORAL_TABLET | ORAL | 0 refills | Status: DC

## 2017-06-12 MED ORDER — FERUMOXYTOL INJECTION 510 MG/17 ML
510.0000 mg | Freq: Once | INTRAVENOUS | Status: AC
  Administered 2017-06-12: 510 mg via INTRAVENOUS
  Filled 2017-06-12: qty 17

## 2017-06-12 NOTE — Telephone Encounter (Signed)
Called Lucky back with [facility] and notified her of note below.  She said they will provide weekly dressing changes and education for the family on Saturday to flush the PICC with normal saline 10 mL flushes daily.  Notified Takasha and advised her that [facility] will call her with the appointment times.

## 2017-06-12 NOTE — Telephone Encounter (Signed)
See my response.

## 2017-06-12 NOTE — Telephone Encounter (Signed)
I have already prescribed saline flushes from last week I do not recommend heparin She does not need heparin flushes

## 2017-06-12 NOTE — Assessment & Plan Note (Signed)
She has severe pain on a daily basis due to open wound and chronic need for dressing changes I refilled her prescription pain medicine today I warned about risk of nausea and constipation with treatment

## 2017-06-12 NOTE — Telephone Encounter (Signed)
[facility] in [place] and spoke to [place], [place].  They can do weekly dressing changes for patient's PICC line.  They also can teach the family how to do daily flushes with 5 mL (10 units/ML) of heparin.  An order for saline and heparin would need to be sent or called in to [facility] in [place].  They would be able to teach the family on Saturday.  Advised her that Dr. Alvy Bimler will be notified about the saline/heparin order and we will call back.

## 2017-06-12 NOTE — Assessment & Plan Note (Signed)
She has received recent blood transfusion We will also proceed with intravenous iron infusion She had multiple GI evaluation in the past that was negative The most likely cause of her anemia is due to chronic blood loss/malabsorption syndrome. We discussed some of the risks, benefits, and alternatives of intravenous iron infusions. The patient is symptomatic from anemia and the iron level is critically low. She tolerated oral iron supplement poorly and desires to achieved higher levels of iron faster for adequate hematopoesis. Some of the side-effects to be expected including risks of infusion reactions, phlebitis, headaches, nausea and fatigue.  The patient is willing to proceed. Patient education material was dispensed.  Goal is to keep ferritin level greater than 50

## 2017-06-12 NOTE — Assessment & Plan Note (Signed)
The patient has placement of PICC line rather than port due to risk of infection with open wound in the vulval region I have arranged for home health nurse to help with dressing changes and educate family about saline flushes

## 2017-06-12 NOTE — Progress Notes (Signed)
[place] OFFICE PROGRESS NOTE  Patient Care Team: Mikey Kirschner, MD as PCP - General (Family Medicine)  ASSESSMENT & PLAN:  Vulva cancer [facility]) We reviewed the NCCN guidelines We discussed the role of chemotherapy. The intent is of palliative intent.  We discussed some of the risks, benefits, side-effects of carboplatin & Taxol. Treatment is intravenous, every 3 weeks x 6 cycles  Some of the short term side-effects included, though not limited to, including weight loss, life threatening infections, risk of allergic reactions, need for transfusions of blood products, nausea, vomiting, change in bowel habits, loss of hair, admission to hospital for various reasons, and risks of death.   Long term side-effects are also discussed including risks of infertility, permanent damage to nerve function, hearing loss, chronic fatigue, kidney damage with possibility needing hemodialysis, and rare secondary malignancy including bone marrow disorders.  The patient is aware that the response rates discussed earlier is not guaranteed.  After a long discussion, patient made an informed decision to proceed with the prescribed plan of care.   Patient education material was dispensed. We discussed premedication with dexamethasone before chemotherapy. Due to her frail status and peripheral neuropathy, I plan to reduce dose of Taxol  Iron deficiency anemia She has received recent blood transfusion We will also proceed with intravenous iron infusion She had multiple GI evaluation in the past that was negative The most likely cause of her anemia is due to chronic blood loss/malabsorption syndrome. We discussed some of the risks, benefits, and alternatives of intravenous iron infusions. The patient is symptomatic from anemia and the iron level is critically low. She tolerated oral iron supplement poorly and desires to achieved higher levels of iron faster for adequate hematopoesis. Some of the  side-effects to be expected including risks of infusion reactions, phlebitis, headaches, nausea and fatigue.  The patient is willing to proceed. Patient education material was dispensed.  Goal is to keep ferritin level greater than 50   S/P PICC central line placement The patient has placement of PICC line rather than port due to risk of infection with open wound in the vulval region I have arranged for home health nurse to help with dressing changes and educate family about saline flushes  Vulvar pain She has severe pain on a daily basis due to open wound and chronic need for dressing changes I refilled her prescription pain medicine today I warned about risk of nausea and constipation with treatment   No orders of the defined types were placed in this encounter.   INTERVAL HISTORY: Please see below for problem oriented charting. She returns with her son for further follow-up and chemotherapy consent She did not proceed with treatment last week but is ready to proceed with treatment tomorrow She is coming in today for intravenous iron infusion She felt stronger since she has received blood transfusion recently   The patient denies any recent signs or symptoms of bleeding such as spontaneous epistaxis, hematuria or hematochezia. She continues to have severe pain near the vulval site with dressing changes She denies recent nausea or constipation  SUMMARY OF ONCOLOGIC HISTORY:   Vulva cancer ([place])   02/22/2016 Pathology Results    Labium, biopsy, right mid - INVASIVE SQUAMOUS CELL CARCINOMA.      02/23/2016 Imaging    Ct abdomen and pelvis Mild asymmetric soft tissue thickening in the right vulvar/perineal region, likely corresponding to the patient's known vulvar cancer, poorly evaluated on CT.  No evidence of metastatic disease.  Status post hysterectomy.      02/27/2016 Pathology Results    1. Lymph nodes, regional resection, right inguinal - METASTATIC CARCINOMA IN 1 OF 6  LYMPH NODES (1/6). 2. Vulva, excision, right - INVASIVE KERATINIZING SQUAMOUS CELL CARCINOMA, MODERATELY DIFFERENTIATED, SPANNING 2.1 CM WITH A DEPTH OF INVASION OF 0.6 CM. - THE SURGICAL RESECTION MARGIN ARE NEGATIVE FOR CARCINOMA. - DIFFUSE LICHEN SCLEROSUS ET ATROPHICUS. - SEE ONCOLOGY TABLE BELOW. Microscopic Comment 2. VULVA Specimen: Right vulvectomy. Procedure: Resection Lymph node sampling performed: Yes. Tumor site: Right vulva Tumor focality: Carcinoma is unifocal. Maximum tumor size (cm): 2.1 cm (gross measurement). Histologic type: Keratinizing squmaous cell carcinoma. Grade: Moderately differentiated. Depth of stromal invasion (mm): 6 mm (glass slide measurement) Margins: Negative for carcinoma. Distance of carcinoma from nearest margin: 0.6 cm to the 3 o'clock margin. Lymph - Vascular invasion: Not identified. Lymph nodes: number examined: 6 Number positive: 1 size of metastatic focus: 0.3 cm (glass slide measurement) extracapsular extension: Not identified. TNM code: pT1b, pN1a FIGO Stage (based on pathologic findings, needs clinical correlation): IIIA Comment: Grossly, there is a 2.1 cm sessile papular lesion which on histologic evaluation reveals invasive keratinizing squamous cell carcinoma invading to a depth of 0.6 cm as measured from a glass slide. In addition, there is diffuse lichen sclerosus et atrophicus. The surgical resection margins are negative for invasive caricnoma. Additional studies can be performed upon clinical request.      02/27/2016 Surgery    Surgery: Right radical partial vulvectomy, right inguinal lymph node dissection  Surgeons:  Imagene Gurney A. Alycia Rossetti, MD; Lahoma Crocker, MD   Pathology: Right vulva with resection margin with suture at 12:00. Right inguinal nodes.   Operative findings: 1.5 cm fungating erosive lesion of the right vulva approximately 2 cm from the urethra. Evidence of lichen sclerosis throughout the entire vulvar. No  other ulcerative lesion suspicious for vulvar carcinoma. Shotty palpable lymphadenopathy in the right inguinal space.        04/01/2016 Imaging    Ct chest 1. Multiple pulmonary nodules as detailed above, generally in the 4 mm and less average diameter range although with 1 right lower lobe subpleural nodule measuring about 8 mm in diameter. These likely warrant surveillance. 2. Hypodense right thyroid nodule, 1.1 cm in long axis. Consider further evaluation with thyroid ultrasound. If patient is clinically hyperthyroid, consider nuclear medicine thyroid uptake and scan. 3. Stable hypodense lateral splenic lesion, likely a cyst. 4. Aortic and coronary atherosclerosis.       04/02/2016 Pathology Results    Labium, biopsy, left minora INVASIVE SQUAMOUS CELL CARCINOMA      04/09/2016 Pathology Results    1. Vulva, vulvectomy, left - INVASIVE SQUAMOUS CELL CARCINOMA, NON-KERATINIZING, SPANNING 2.4 CM IN WIDTH X 0.4 CM IN DEPTH. - CARCINOMA IS BROADLY PRESENT AT THE 6 O'CLOCK MARGIN OF SPECIMEN #1. - SEE ONCOLOGY TABLE BELOW. 2. Vulva, excision, lateral margin - SQUAMOUS LINED EPITHELIUM WITH LICHEN SCLEROSUS ET ATROPHICUS. 3. Vulva, excision, medial margin - SQUAMOUS LINED EPITHELIUM WITH LICHEN SCLEROSUS ET ATROPHICUS. Microscopic Comment 1. VULVA Specimen: Left vulva. Procedure: Left vulvectomy with additional lateral and medial margins. Lymph node sampling performed: Not on current case. Tumor site: Left vulva. Tumor focality: Unifocal. Maximum tumor size (cm): 2.4 cm in width (gross measurement) Histologic type: Squamous cell carcinoma, non-keratinizing. Grade: High grade. Depth of stromal invasion (mm): 0.4 cm (4 mm), glass slide measurement. Margins: Invasive carcinoma broadly present at the 6 o'clock margin of specimen #1. Lymph - Vascular invasion: Not identified.  Lymph nodes: None examined. TNM code: pT1b, pNX FIGO Stage (based on pathologic findings, needs clinical  correlation): IB      04/09/2016 Surgery    Surgery: Partial radical left vulvectomy  Surgeons:  Donaciano Eva, MD  Pathology: 1/ left vulva with marking stitch at 12 o'clock anterior, 2/ lateral (thigh) margin with stitch at true new lateral margin. 3/ medial (vaginal) margin with marking stitch at true new medial margin. Operative findings: 2cm left mid labial nodular lesion        05/03/2016 Imaging    Ct chest 1.  No evidence of acute pulmonary embolus. 2. Calcified coronary artery atherosclerosis. Negative visible aorta. 3. Pulmonary atelectasis with no other acute pulmonary process. The small subpleural pulmonary nodules seen by CT last month are stable.       06/05/2016 - 07/25/2016 Radiation Therapy    Radiation treatment dates:   06/05/16 - 07/25/16  Site/dose:   Pelvis treated to 45 Gy in 25 fractions, then vulvar region Boosted an additional 18 Gy in 9 fractions.  Beams/energy:   Pelvis:  IMRT  //  6X                              Boost:  IMRT  //  6X       09/30/2016 Pathology Results    Vulva, biopsy, anterior vulva INVASIVE SQUAMOUS CELL CARCINOMA      10/07/2016 Imaging    Ct chest 1. Stable bilateral pulmonary nodules, likely benign but will require surveillance. Recommend followup noncontrast chest CT in 6-12 months. 2. No mediastinal or hilar mass or adenopathy. 3. Stable coronary artery calcifications.  Aortic Atherosclerosis (ICD10-I70.0).      10/17/2016 Pathology Results    Vulva, vulvectomy, anterior - INVASIVE SQUAMOUS CELL CARCINOMA, SPANNING 2.7 CM AND EXTENDING TO A DEPTH OF 0.3 CM. - RESECTION MARGINS ARE NEGATIVE. - SEE ONCOLOGY TABLE. Microscopic Comment VULVA Specimen: Anterior vulva. Procedure: Partial anterior vulvectomy. Lymph node sampling performed: None. Tumor site: Anterior vulva. Tumor focality: Unifocal. Maximum tumor size (cm): 2.7 cm. Histologic type: Squamous cell carcinoma, non-keratinizing. Grade: G2,  moderately differentiated. Depth of stromal invasion (mm): 3 mm. Margins: Negative Distance of carcinoma from nearest margin: Invasive tumor is 1 mm from urethral margin. Lymph - Vascular invasion: Not identified. Lymph nodes: None. TNM code: pT1b, pNX FIGO Stage (based on pathologic findings, needs clinical correlation): IB      10/17/2016 Surgery    Surgery: Partial radical anterior vulvectomy  Surgeons:  Donaciano Eva, MD  Pathology: anterior vulva with marking stitch at anterior clitoral margin o'clock  Operative findings: 3cm raised vulvar cancer (leukoplakia) at midline and right vulva extending to within 0.5cm of anterior urethral meatus        02/17/2017 Pathology Results    Vulva, biopsy - PARAKERATOSIS AND ACUTE INFLAMMATION. - PAS IS NEGATIVE FOR FUNGAL ORGANISMS. - NO DYSPLASIA OR MALIGNANCY.      05/01/2017 Imaging    Ct abdomen and pelvis New 4 x 3 cm rim enhancing lesion central air bubbles in the right vulvar region, which could represent locally recurrent tumor with necrosis or abscess.  No evidence of metastatic disease within the abdomen or pelvis.      05/15/2017 Pathology Results    1. Labium, biopsy, right posterior labia majora - PARAKERATOSIS. - NO DYSPLASIA OR MALIGNANCY. 2. Vulva, excision, anterior right mons pubis lesion - INVASIVE MODERATE TO POORLY DIFFERENTIATED SQUAMOUS CELL CARCINOMA. -  ASSOCIATED ULCER, NECROSIS AND ACUTE INFLAMMATION. - SEE COMMENT. 3. Labium, biopsy, left mid labia majora - POORLY DIFFERENTIATED SQUAMOUS CELL CARCINOMA. - SEE COMMENT. Microscopic Comment 2. The specimen is disrupted hampering assessment of margin orientation and size, however, carcinoma involves almost the entire specimen (thus size estimated to be at least 3.1 cm with depth of 1 cm) and extends to the majority of the presumed lateral and deep margins. There is perineural invasion. 3. The carcinoma is largely beneath the epidermis and extends  to the deep and lateral edges of the biopsy.      05/15/2017 Surgery    Surgery: debridement of anterior vulvar abscess, vulvar biopsies.   Surgeons:  Donaciano Eva, MD  Pathology: right posterior labia majora, anterior mons pubis, left mid labia majora  Operative findings: 6cm cavitating anterior mons pubis indurated lesion. It is approximately 3.5cm from the urethral meatus to inferior aspect of mass. Two indurated 1-2cm nodular areas (one on the posterior right labia majora and one on the mid left labia majora). No periurethral lesions.        06/02/2017 PET scan    Stable hypermetabolic soft tissue density in right vulva, which may be due to postoperative change from recent excision, although residual malignancy and infection cannot be excluded.  No evidence of local or distant metastatic disease.      06/06/2017 Procedure    Successful right arm power PICC line placement with ultrasound and fluoroscopic guidance. The catheter is ready for use.      06/12/2017 Cancer Staging    Staging form: Vulva, AJCC 8th Edition - Clinical: Stage II (cT2, cN0, cM0) - Signed by Heath Lark, MD on 06/12/2017       REVIEW OF SYSTEMS:   Constitutional: Denies fevers, chills or abnormal weight loss Eyes: Denies blurriness of vision Ears, nose, mouth, throat, and face: Denies mucositis or sore throat Respiratory: Denies cough, dyspnea or wheezes Cardiovascular: Denies palpitation, chest discomfort or lower extremity swelling Gastrointestinal:  Denies nausea, heartburn or change in bowel habits Skin: Denies abnormal skin rashes Lymphatics: Denies new lymphadenopathy or easy bruising Neurological:Denies numbness, tingling or new weaknesses Behavioral/Psych: Mood is stable, no new changes  All other systems were reviewed with the patient and are negative.  I have reviewed the past medical history, past surgical history, social history and family history with the patient and they are  unchanged from previous note.  ALLERGIES:  is allergic to oxycodone; celebrex [celecoxib]; doxycycline; iron; lodine [etodolac]; nabumetone; tetracycline; and xarelto [rivaroxaban].  MEDICATIONS:  Current Outpatient Medications  Medication Sig Dispense Refill  . blood glucose meter kit and supplies KIT Dispense based on patient and insurance preference. Use up to three times daily as directed (FOR ICD-10 E11.9). 1 each 0  . dexamethasone (DECADRON) 4 MG tablet Take 3 tabs the night before and 3 tabs the morning of chemotherapy, every 3 weeks, with food 60 tablet 0  . ferrous sulfate 325 (65 FE) MG tablet Take 1 tablet (325 mg total) by mouth daily with breakfast. 30 tablet 1  . gabapentin (NEURONTIN) 300 MG capsule Take 1 capsule (300 mg total) by mouth 3 (three) times daily. (Patient taking differently: Take 300 mg by mouth 3 (three) times daily. ) 90 capsule 2  . HYDROcodone-acetaminophen (NORCO) 5-325 MG tablet One tablet Q 4 hours prn. Caution drowsiness. 60 tablet 0  . losartan (COZAAR) 50 MG tablet TAKE ONE TABLET BY MOUTH ONCE DAILY. (Patient taking differently: TAKE ONE TABLET BY MOUTH ONCE DAILY.---  takes in am) 90 tablet 1  . metFORMIN (GLUCOPHAGE) 500 MG tablet TAKE (1) TABLET BY MOUTH TWICE DAILY. 180 tablet 0  . ondansetron (ZOFRAN) 8 MG tablet Take 1 tablet (8 mg total) by mouth 2 (two) times daily as needed for refractory nausea / vomiting. Start on day 3 after chemo. 30 tablet 1  . ondansetron (ZOFRAN) 8 MG tablet Take 1 tablet (8 mg total) by mouth every 8 (eight) hours as needed for nausea. 30 tablet 3  . oxyCODONE-acetaminophen (PERCOCET) 10-325 MG tablet Take 1 tablet by mouth every 4 (four) hours as needed for pain. (Patient not taking: Reported on 05/27/2017) 30 tablet 0  . potassium chloride SA (K-DUR,KLOR-CON) 20 MEQ tablet TAKE 1 TABLET BY MOUTH ONCE A DAY. (Patient taking differently: TAKE 1 TABLET BY MOUTH ONCE A DAY.  --- takes in am) 90 tablet 0  . prochlorperazine  (COMPAZINE) 10 MG tablet Take 1 tablet (10 mg total) by mouth every 6 (six) hours as needed (Nausea or vomiting). 30 tablet 1  . prochlorperazine (COMPAZINE) 10 MG tablet Take 1 tablet (10 mg total) by mouth every 6 (six) hours as needed for nausea or vomiting. 30 tablet 0  . sodium chloride flush 0.9 % SOLN injection Inject 10 mLs into the vein daily. 30 Syringe 9   No current facility-administered medications for this visit.    Facility-Administered Medications Ordered in Other Visits  Medication Dose Route Frequency Provider Last Rate Last Dose  . sodium chloride flush (NS) 0.9 % injection 10 mL  10 mL Intracatheter PRN Alvy Bimler, Jovonta Levit, MD   10 mL at 06/12/17 1136    PHYSICAL EXAMINATION: ECOG PERFORMANCE STATUS: 1 - Symptomatic but completely ambulatory  Vitals:   06/12/17 0928  BP: 122/60  Pulse: 89  Resp: 18  Temp: 98.4 F (36.9 C)  SpO2: 100%   Filed Weights   06/12/17 0928  Weight: 185 lb 8 oz (84.1 kg)    GENERAL:alert, no distress and comfortable.  She looks frail Musculoskeletal:no cyanosis of digits and no clubbing  NEURO: alert & oriented x 3 with fluent speech, no focal motor/sensory deficits  LABORATORY DATA:  I have reviewed the data as listed    Component Value Date/Time   NA 141 06/12/2017 0827   NA 144 09/30/2016 1505   K 3.9 06/12/2017 0827   K 4.4 09/30/2016 1505   CL 107 06/12/2017 0827   CO2 26 06/12/2017 0827   CO2 27 09/30/2016 1505   GLUCOSE 121 06/12/2017 0827   GLUCOSE 101 09/30/2016 1505   BUN 12 06/12/2017 0827   BUN 16.7 09/30/2016 1505   CREATININE 0.77 06/12/2017 0827   CREATININE 0.8 09/30/2016 1505   CALCIUM 9.8 06/12/2017 0827   CALCIUM 9.8 09/30/2016 1505   PROT 7.0 06/12/2017 0827   PROT 7.3 09/30/2016 1505   ALBUMIN 3.7 06/12/2017 0827   ALBUMIN 3.6 09/30/2016 1505   AST 11 06/12/2017 0827   AST 27 09/30/2016 1505   ALT 11 06/12/2017 0827   ALT 26 09/30/2016 1505   ALKPHOS 76 06/12/2017 0827   ALKPHOS 76 09/30/2016 1505    BILITOT 0.5 06/12/2017 0827   BILITOT 0.49 09/30/2016 1505   GFRNONAA >60 06/12/2017 0827   GFRAA >60 06/12/2017 0827    No results found for: SPEP, UPEP  Lab Results  Component Value Date   WBC 5.1 06/12/2017   NEUTROABS 3.8 06/12/2017   HGB 10.2 (L) 06/12/2017   HCT 34.1 (L) 06/12/2017   MCV 88.6 06/12/2017  PLT 190 06/12/2017      Chemistry      Component Value Date/Time   NA 141 06/12/2017 0827   NA 144 09/30/2016 1505   K 3.9 06/12/2017 0827   K 4.4 09/30/2016 1505   CL 107 06/12/2017 0827   CO2 26 06/12/2017 0827   CO2 27 09/30/2016 1505   BUN 12 06/12/2017 0827   BUN 16.7 09/30/2016 1505   CREATININE 0.77 06/12/2017 0827   CREATININE 0.8 09/30/2016 1505      Component Value Date/Time   CALCIUM 9.8 06/12/2017 0827   CALCIUM 9.8 09/30/2016 1505   ALKPHOS 76 06/12/2017 0827   ALKPHOS 76 09/30/2016 1505   AST 11 06/12/2017 0827   AST 27 09/30/2016 1505   ALT 11 06/12/2017 0827   ALT 26 09/30/2016 1505   BILITOT 0.5 06/12/2017 0827   BILITOT 0.49 09/30/2016 1505       RADIOGRAPHIC STUDIES: I have personally reviewed the radiological images as listed and agreed with the findings in the report. Nm Pet Image Initial (pi) Skull Base To Thigh  Result Date: 06/02/2017 CLINICAL DATA:  Initial treatment strategy for recurrent vulvar squamous cell carcinoma. EXAM: NUCLEAR MEDICINE PET SKULL BASE TO THIGH TECHNIQUE: 9.5 mCi F-18 FDG was injected intravenously. Full-ring PET imaging was performed from the skull base to thigh after the radiotracer. CT data was obtained and used for attenuation correction and anatomic localization. Fasting blood glucose: 110 mg/dl COMPARISON:  AP CT on 04/30/2017 and chest CT on 10/07/2016 FINDINGS: (Mediastinal blood pool activity: SUV max = 2.6) NECK: No hypermetabolic lymph nodes. A 1.3 cm low-attenuation right thyroid lobe nodule is seen which is slightly increased in size from 11 mm previously. This has hypermetabolic activity, with SUV  max 8.4. Incidental CT findings:  None. CHEST: No hypermetabolic masses or lymphadenopathy. No suspicious pulmonary nodules seen on CT images. Incidental CT findings:  None. ABDOMEN/PELVIS: No abnormal hypermetabolic activity within the liver, pancreas, adrenal glands, or spleen. No hypermetabolic lymph nodes in the abdomen or pelvis. Hypermetabolic soft tissue density is again seen in the right vulva which measures 4.1 x 2.7 cm, and shows central cavity. This is unchanged in size and shows hypermetabolic activity with SUV max of 11.5. This could be due to postoperative change from recent surgical excision, although residual malignancy and infection cannot be excluded. Incidental CT findings: Prior hysterectomy again noted as well as left hip prosthesis. SKELETON: No focal hypermetabolic bone lesions to suggest skeletal metastasis. Incidental CT findings:  None. IMPRESSION: Stable hypermetabolic soft tissue density in right vulva, which may be due to postoperative change from recent excision, although residual malignancy and infection cannot be excluded. No evidence of local or distant metastatic disease. Electronically Signed   By: Earle Gell M.D.   On: 06/02/2017 15:24   Ir Picc Placement Right >5 Yrs Inc Img Guide  Result Date: 06/06/2017 INDICATION: Patient with history of vulvar carcinoma, iron deficiency anemia; central venous access requested for chemotherapy and iron infusions EXAM: RIGHT UPPER EXTREMITY PICC LINE PLACEMENT WITH ULTRASOUND AND FLUOROSCOPIC GUIDANCE MEDICATIONS: 1% lidocaine to skin and subcutaneous tissues ANESTHESIA/SEDATION: None FLUOROSCOPY TIME:  Fluoroscopy Time: 1 minute  (9 mGy). COMPLICATIONS: None immediate. PROCEDURE: The patient was advised of the possible risks and complications and agreed to undergo the procedure. The patient was then brought to the angiographic suite for the procedure. The right arm was prepped with chlorhexidine, draped in the usual sterile fashion using  maximum barrier technique (cap and mask, sterile gown,  sterile gloves, large sterile sheet, hand hygiene and cutaneous antisepsis) and infiltrated locally with 1% Lidocaine. Ultrasound demonstrated patency of the right basilic vein, and this was documented with an image. Under real-time ultrasound guidance, this vein was accessed with a 21 gauge micropuncture needle and image documentation was performed. A 0.018 wire was introduced in to the vein. Over this, a 5 [place] double lumen power injectable PICC was advanced to the lower SVC/right atrial junction. Fluoroscopy during the procedure and fluoro spot radiograph confirms appropriate catheter position. The catheter was flushed and covered with a sterile dressing. Catheter length: 42 cm IMPRESSION: Successful right arm power PICC line placement with ultrasound and fluoroscopic guidance. The catheter is ready for use. Read by: Rowe Robert, PA-C Electronically Signed   By: Aletta Edouard M.D.   On: 06/06/2017 16:42    All questions were answered. The patient knows to call the clinic with any problems, questions or concerns. No barriers to learning was detected.  I spent 15 minutes counseling the patient face to face. The total time spent in the appointment was 20 minutes and more than 50% was on counseling and review of test results  Heath Lark, MD 06/12/2017 12:53 PM

## 2017-06-12 NOTE — Progress Notes (Signed)
@  0958 Met resistance and  Not able to flush the purple lumen on pt PICC. Red lumen flushes well and blood return noted. Elmyra Ricks Automotive engineer for Dr Alvy Bimler notified.

## 2017-06-12 NOTE — Patient Instructions (Signed)

## 2017-06-12 NOTE — Assessment & Plan Note (Signed)
We reviewed the NCCN guidelines We discussed the role of chemotherapy. The intent is of palliative intent.  We discussed some of the risks, benefits, side-effects of carboplatin & Taxol. Treatment is intravenous, every 3 weeks x 6 cycles  Some of the short term side-effects included, though not limited to, including weight loss, life threatening infections, risk of allergic reactions, need for transfusions of blood products, nausea, vomiting, change in bowel habits, loss of hair, admission to hospital for various reasons, and risks of death.   Long term side-effects are also discussed including risks of infertility, permanent damage to nerve function, hearing loss, chronic fatigue, kidney damage with possibility needing hemodialysis, and rare secondary malignancy including bone marrow disorders.  The patient is aware that the response rates discussed earlier is not guaranteed.  After a long discussion, patient made an informed decision to proceed with the prescribed plan of care.   Patient education material was dispensed. We discussed premedication with dexamethasone before chemotherapy. Due to her frail status and peripheral neuropathy, I plan to reduce dose of Taxol

## 2017-06-13 ENCOUNTER — Ambulatory Visit: Payer: PPO | Admitting: Nurse Practitioner

## 2017-06-13 ENCOUNTER — Telehealth: Payer: Self-pay | Admitting: Nurse Practitioner

## 2017-06-13 ENCOUNTER — Inpatient Hospital Stay: Payer: PPO

## 2017-06-13 VITALS — BP 120/62 | HR 79 | Temp 98.3°F | Resp 18

## 2017-06-13 DIAGNOSIS — C519 Malignant neoplasm of vulva, unspecified: Secondary | ICD-10-CM

## 2017-06-13 DIAGNOSIS — Z5111 Encounter for antineoplastic chemotherapy: Secondary | ICD-10-CM | POA: Diagnosis not present

## 2017-06-13 MED ORDER — PALONOSETRON HCL INJECTION 0.25 MG/5ML
0.2500 mg | Freq: Once | INTRAVENOUS | Status: AC
  Administered 2017-06-13: 0.25 mg via INTRAVENOUS

## 2017-06-13 MED ORDER — FAMOTIDINE IN NACL 20-0.9 MG/50ML-% IV SOLN
20.0000 mg | Freq: Once | INTRAVENOUS | Status: AC
  Administered 2017-06-13: 20 mg via INTRAVENOUS

## 2017-06-13 MED ORDER — SODIUM CHLORIDE 0.9 % IV SOLN
Freq: Once | INTRAVENOUS | Status: AC
  Administered 2017-06-13: 13:00:00 via INTRAVENOUS
  Filled 2017-06-13: qty 5

## 2017-06-13 MED ORDER — DIPHENHYDRAMINE HCL 50 MG/ML IJ SOLN
INTRAMUSCULAR | Status: AC
  Filled 2017-06-13: qty 1

## 2017-06-13 MED ORDER — SODIUM CHLORIDE 0.9 % IV SOLN
Freq: Once | INTRAVENOUS | Status: AC
  Administered 2017-06-13: 12:00:00 via INTRAVENOUS

## 2017-06-13 MED ORDER — PALONOSETRON HCL INJECTION 0.25 MG/5ML
INTRAVENOUS | Status: AC
  Filled 2017-06-13: qty 5

## 2017-06-13 MED ORDER — DIPHENHYDRAMINE HCL 50 MG/ML IJ SOLN
50.0000 mg | Freq: Once | INTRAMUSCULAR | Status: AC
  Administered 2017-06-13: 50 mg via INTRAVENOUS

## 2017-06-13 MED ORDER — SODIUM CHLORIDE 0.9 % IV SOLN
529.2000 mg | Freq: Once | INTRAVENOUS | Status: AC
  Administered 2017-06-13: 530 mg via INTRAVENOUS
  Filled 2017-06-13: qty 53

## 2017-06-13 MED ORDER — FAMOTIDINE IN NACL 20-0.9 MG/50ML-% IV SOLN
INTRAVENOUS | Status: AC
  Filled 2017-06-13: qty 50

## 2017-06-13 MED ORDER — SODIUM CHLORIDE 0.9% FLUSH
10.0000 mL | INTRAVENOUS | Status: DC | PRN
  Administered 2017-06-13: 10 mL
  Filled 2017-06-13: qty 10

## 2017-06-13 MED ORDER — HEPARIN SOD (PORK) LOCK FLUSH 100 UNIT/ML IV SOLN
500.0000 [IU] | Freq: Once | INTRAVENOUS | Status: AC | PRN
  Administered 2017-06-13: 500 [IU]
  Filled 2017-06-13: qty 5

## 2017-06-13 MED ORDER — PACLITAXEL CHEMO INJECTION 300 MG/50ML
131.2500 mg/m2 | Freq: Once | INTRAVENOUS | Status: AC
  Administered 2017-06-13: 252 mg via INTRAVENOUS
  Filled 2017-06-13: qty 42

## 2017-06-13 NOTE — Telephone Encounter (Signed)
I tried calling [facility] not set up.

## 2017-06-13 NOTE — Progress Notes (Signed)
Addendum to note placed on 06/12/2017, Per Elmyra Azadeh Hyder RN for Dr Alvy Bimler, pt's [place] RN to address PICC line issue with purple lumen and line.

## 2017-06-13 NOTE — Telephone Encounter (Signed)
Please advise 

## 2017-06-13 NOTE — Progress Notes (Signed)
Pt lumen 1 (Purple lumen) unable to flush.  Elmyra Ricks, RN for Dr. Alvy Bimler notified and she has set up for home health to come assess pt on saturday 06/14/17.  Instructed to proceed with treatment through lumen 2 today (Red one).

## 2017-06-13 NOTE — Telephone Encounter (Signed)
Patient is requesting a refill on her hydrocodone.  She has about 14 pills left.  She takes it every 4-5 hours because her pain is getting worse.  She said she will be out over the weekend and would like to pick up Rx today.  She said that Dr. Alvy Bimler gave her a Rx, but she promised [name] she wouldn't fill unless speaking to her.

## 2017-06-13 NOTE — Patient Instructions (Signed)
[facility] Discharge Instructions for Patients Receiving Chemotherapy  Today you received the following chemotherapy agents Taxol and Carboplatin  To help prevent nausea and vomiting after your treatment, we encourage you to take your nausea medication as directed   If you develop nausea and vomiting that is not controlled by your nausea medication, call the clinic.   BELOW ARE SYMPTOMS THAT SHOULD BE REPORTED IMMEDIATELY:  *FEVER GREATER THAN 100.5 F  *CHILLS WITH OR WITHOUT FEVER  NAUSEA AND VOMITING THAT IS NOT CONTROLLED WITH YOUR NAUSEA MEDICATION  *UNUSUAL SHORTNESS OF BREATH  *UNUSUAL BRUISING OR BLEEDING  TENDERNESS IN MOUTH AND THROAT WITH OR WITHOUT PRESENCE OF ULCERS  *URINARY PROBLEMS  *BOWEL PROBLEMS  UNUSUAL RASH Items with * indicate a potential emergency and should be followed up as soon as possible.  Feel free to call the clinic should you have any questions or concerns. The clinic phone number is [phone].  Please show the CHEMO ALERT CARD at check-in to the Emergency Department and triage nurse.   Paclitaxel (Taxol) injection What is this medicine? PACLITAXEL (PAK li TAX el) is a chemotherapy drug. It targets fast dividing cells, like cancer cells, and causes these cells to die. This medicine is used to treat ovarian cancer, breast cancer, and other cancers. This medicine may be used for other purposes; ask your health care provider or pharmacist if you have questions. COMMON BRAND NAME(S): Onxol, Taxol What should I tell my health care provider before I take this medicine? They need to know if you have any of these conditions: -blood disorders -irregular heartbeat -infection (especially a virus infection such as chickenpox, cold sores, or herpes) -liver disease -previous or ongoing radiation therapy -an unusual or allergic reaction to paclitaxel, alcohol, polyoxyethylated castor oil, other chemotherapy agents, other medicines,  foods, dyes, or preservatives -pregnant or trying to get pregnant -breast-feeding How should I use this medicine? This drug is given as an infusion into a vein. It is administered in a hospital or clinic by a specially trained health care professional. Talk to your pediatrician regarding the use of this medicine in children. Special care may be needed. Overdosage: If you think you have taken too much of this medicine contact a poison control center or emergency room at once. NOTE: This medicine is only for you. Do not share this medicine with others. What if I miss a dose? It is important not to miss your dose. Call your doctor or health care professional if you are unable to keep an appointment. What may interact with this medicine? Do not take this medicine with any of the following medications: -disulfiram -metronidazole This medicine may also interact with the following medications: -cyclosporine -diazepam -ketoconazole -medicines to increase blood counts like filgrastim, pegfilgrastim, sargramostim -other chemotherapy drugs like cisplatin, doxorubicin, epirubicin, etoposide, teniposide, vincristine -quinidine -testosterone -vaccines -verapamil Talk to your doctor or health care professional before taking any of these medicines: -acetaminophen -aspirin -ibuprofen -ketoprofen -naproxen This list may not describe all possible interactions. Give your health care provider a list of all the medicines, herbs, non-prescription drugs, or dietary supplements you use. Also tell them if you smoke, drink alcohol, or use illegal drugs. Some items may interact with your medicine. What should I watch for while using this medicine? Your condition will be monitored carefully while you are receiving this medicine. You will need important blood work done while you are taking this medicine. This medicine can cause serious allergic reactions. To reduce your risk you   will need to take other  medicine(s) before treatment with this medicine. If you experience allergic reactions like skin rash, itching or hives, swelling of the face, lips, or tongue, tell your doctor or health care professional right away. In some cases, you may be given additional medicines to help with side effects. Follow all directions for their use. This drug may make you feel generally unwell. This is not uncommon, as chemotherapy can affect healthy cells as well as cancer cells. Report any side effects. Continue your course of treatment even though you feel ill unless your doctor tells you to stop. Call your doctor or health care professional for advice if you get a fever, chills or sore throat, or other symptoms of a cold or flu. Do not treat yourself. This drug decreases your body's ability to fight infections. Try to avoid being around people who are sick. This medicine may increase your risk to bruise or bleed. Call your doctor or health care professional if you notice any unusual bleeding. Be careful brushing and flossing your teeth or using a toothpick because you may get an infection or bleed more easily. If you have any dental work done, tell your dentist you are receiving this medicine. Avoid taking products that contain aspirin, acetaminophen, ibuprofen, naproxen, or ketoprofen unless instructed by your doctor. These medicines may hide a fever. Do not become pregnant while taking this medicine. Women should inform their doctor if they wish to become pregnant or think they might be pregnant. There is a potential for serious side effects to an unborn child. Talk to your health care professional or pharmacist for more information. Do not breast-feed an infant while taking this medicine. Men are advised not to father a child while receiving this medicine. This product may contain alcohol. Ask your pharmacist or healthcare provider if this medicine contains alcohol. Be sure to tell all healthcare providers you are  taking this medicine. Certain medicines, like metronidazole and disulfiram, can cause an unpleasant reaction when taken with alcohol. The reaction includes flushing, headache, nausea, vomiting, sweating, and increased thirst. The reaction can last from 30 minutes to several hours. What side effects may I notice from receiving this medicine? Side effects that you should report to your doctor or health care professional as soon as possible: -allergic reactions like skin rash, itching or hives, swelling of the face, lips, or tongue -low blood counts - This drug may decrease the number of white blood cells, red blood cells and platelets. You may be at increased risk for infections and bleeding. -signs of infection - fever or chills, cough, sore throat, pain or difficulty passing urine -signs of decreased platelets or bleeding - bruising, pinpoint red spots on the skin, black, tarry stools, nosebleeds -signs of decreased red blood cells - unusually weak or tired, fainting spells, lightheadedness -breathing problems -chest pain -high or low blood pressure -mouth sores -nausea and vomiting -pain, swelling, redness or irritation at the injection site -pain, tingling, numbness in the hands or feet -slow or irregular heartbeat -swelling of the ankle, feet, hands Side effects that usually do not require medical attention (report to your doctor or health care professional if they continue or are bothersome): -bone pain -complete hair loss including hair on your head, underarms, pubic hair, eyebrows, and eyelashes -changes in the color of fingernails -diarrhea -loosening of the fingernails -loss of appetite -muscle or joint pain -red flush to skin -sweating This list may not describe all possible side effects. Call your doctor for   medical advice about side effects. You may report side effects to FDA at 1-800-FDA-1088. Where should I keep my medicine? This drug is given in a hospital or clinic and  will not be stored at home. NOTE: This sheet is a summary. It may not cover all possible information. If you have questions about this medicine, talk to your doctor, pharmacist, or health care provider.  2018 Elsevier/Gold Standard (2014-11-08 19:58:00)   Carboplatin injection What is this medicine? CARBOPLATIN (KAR boe pla tin) is a chemotherapy drug. It targets fast dividing cells, like cancer cells, and causes these cells to die. This medicine is used to treat ovarian cancer and many other cancers. This medicine may be used for other purposes; ask your health care provider or pharmacist if you have questions. COMMON BRAND NAME(S): Paraplatin What should I tell my health care provider before I take this medicine? They need to know if you have any of these conditions: -blood disorders -hearing problems -kidney disease -recent or ongoing radiation therapy -an unusual or allergic reaction to carboplatin, cisplatin, other chemotherapy, other medicines, foods, dyes, or preservatives -pregnant or trying to get pregnant -breast-feeding How should I use this medicine? This drug is usually given as an infusion into a vein. It is administered in a hospital or clinic by a specially trained health care professional. Talk to your pediatrician regarding the use of this medicine in children. Special care may be needed. Overdosage: If you think you have taken too much of this medicine contact a poison control center or emergency room at once. NOTE: This medicine is only for you. Do not share this medicine with others. What if I miss a dose? It is important not to miss a dose. Call your doctor or health care professional if you are unable to keep an appointment. What may interact with this medicine? -medicines for seizures -medicines to increase blood counts like filgrastim, pegfilgrastim, sargramostim -some antibiotics like amikacin, gentamicin, neomycin, streptomycin, tobramycin -vaccines Talk to  your doctor or health care professional before taking any of these medicines: -acetaminophen -aspirin -ibuprofen -ketoprofen -naproxen This list may not describe all possible interactions. Give your health care provider a list of all the medicines, herbs, non-prescription drugs, or dietary supplements you use. Also tell them if you smoke, drink alcohol, or use illegal drugs. Some items may interact with your medicine. What should I watch for while using this medicine? Your condition will be monitored carefully while you are receiving this medicine. You will need important blood work done while you are taking this medicine. This drug may make you feel generally unwell. This is not uncommon, as chemotherapy can affect healthy cells as well as cancer cells. Report any side effects. Continue your course of treatment even though you feel ill unless your doctor tells you to stop. In some cases, you may be given additional medicines to help with side effects. Follow all directions for their use. Call your doctor or health care professional for advice if you get a fever, chills or sore throat, or other symptoms of a cold or flu. Do not treat yourself. This drug decreases your body's ability to fight infections. Try to avoid being around people who are sick. This medicine may increase your risk to bruise or bleed. Call your doctor or health care professional if you notice any unusual bleeding. Be careful brushing and flossing your teeth or using a toothpick because you may get an infection or bleed more easily. If you have any dental work done,   tell your dentist you are receiving this medicine. Avoid taking products that contain aspirin, acetaminophen, ibuprofen, naproxen, or ketoprofen unless instructed by your doctor. These medicines may hide a fever. Do not become pregnant while taking this medicine. Women should inform their doctor if they wish to become pregnant or think they might be pregnant. There is a  potential for serious side effects to an unborn child. Talk to your health care professional or pharmacist for more information. Do not breast-feed an infant while taking this medicine. What side effects may I notice from receiving this medicine? Side effects that you should report to your doctor or health care professional as soon as possible: -allergic reactions like skin rash, itching or hives, swelling of the face, lips, or tongue -signs of infection - fever or chills, cough, sore throat, pain or difficulty passing urine -signs of decreased platelets or bleeding - bruising, pinpoint red spots on the skin, black, tarry stools, nosebleeds -signs of decreased red blood cells - unusually weak or tired, fainting spells, lightheadedness -breathing problems -changes in hearing -changes in vision -chest pain -high blood pressure -low blood counts - This drug may decrease the number of white blood cells, red blood cells and platelets. You may be at increased risk for infections and bleeding. -nausea and vomiting -pain, swelling, redness or irritation at the injection site -pain, tingling, numbness in the hands or feet -problems with balance, talking, walking -trouble passing urine or change in the amount of urine Side effects that usually do not require medical attention (report to your doctor or health care professional if they continue or are bothersome): -hair loss -loss of appetite -metallic taste in the mouth or changes in taste This list may not describe all possible side effects. Call your doctor for medical advice about side effects. You may report side effects to FDA at 1-800-FDA-1088. Where should I keep my medicine? This drug is given in a hospital or clinic and will not be stored at home. NOTE: This sheet is a summary. It may not cover all possible information. If you have questions about this medicine, talk to your doctor, pharmacist, or health care provider.  2018 Elsevier/Gold  Standard (2007-04-14 14:38:05)   

## 2017-06-13 NOTE — Telephone Encounter (Signed)
So to clarify. She has a Rx from Dr. Alvy Bimler or she needs one from me? Also I can send this directly in for her. Which pharmacy?

## 2017-06-14 ENCOUNTER — Other Ambulatory Visit: Payer: Self-pay | Admitting: Nurse Practitioner

## 2017-06-14 DIAGNOSIS — E119 Type 2 diabetes mellitus without complications: Secondary | ICD-10-CM | POA: Diagnosis not present

## 2017-06-14 DIAGNOSIS — N764 Abscess of vulva: Secondary | ICD-10-CM | POA: Diagnosis not present

## 2017-06-14 DIAGNOSIS — Z96651 Presence of right artificial knee joint: Secondary | ICD-10-CM | POA: Diagnosis not present

## 2017-06-14 DIAGNOSIS — Z96642 Presence of left artificial hip joint: Secondary | ICD-10-CM | POA: Diagnosis not present

## 2017-06-14 DIAGNOSIS — Z9181 History of falling: Secondary | ICD-10-CM | POA: Diagnosis not present

## 2017-06-14 DIAGNOSIS — M1990 Unspecified osteoarthritis, unspecified site: Secondary | ICD-10-CM | POA: Diagnosis not present

## 2017-06-14 DIAGNOSIS — Z7984 Long term (current) use of oral hypoglycemic drugs: Secondary | ICD-10-CM | POA: Diagnosis not present

## 2017-06-14 DIAGNOSIS — Z7982 Long term (current) use of aspirin: Secondary | ICD-10-CM | POA: Diagnosis not present

## 2017-06-14 DIAGNOSIS — E559 Vitamin D deficiency, unspecified: Secondary | ICD-10-CM | POA: Diagnosis not present

## 2017-06-14 DIAGNOSIS — Z79891 Long term (current) use of opiate analgesic: Secondary | ICD-10-CM | POA: Diagnosis not present

## 2017-06-14 DIAGNOSIS — Z483 Aftercare following surgery for neoplasm: Secondary | ICD-10-CM | POA: Diagnosis not present

## 2017-06-14 DIAGNOSIS — I10 Essential (primary) hypertension: Secondary | ICD-10-CM | POA: Diagnosis not present

## 2017-06-14 DIAGNOSIS — C519 Malignant neoplasm of vulva, unspecified: Secondary | ICD-10-CM | POA: Diagnosis not present

## 2017-06-14 DIAGNOSIS — D649 Anemia, unspecified: Secondary | ICD-10-CM | POA: Diagnosis not present

## 2017-06-17 ENCOUNTER — Inpatient Hospital Stay: Payer: PPO

## 2017-06-17 ENCOUNTER — Telehealth: Payer: Self-pay | Admitting: *Deleted

## 2017-06-17 ENCOUNTER — Encounter: Payer: Self-pay | Admitting: Oncology

## 2017-06-17 DIAGNOSIS — D509 Iron deficiency anemia, unspecified: Secondary | ICD-10-CM

## 2017-06-17 DIAGNOSIS — Z5111 Encounter for antineoplastic chemotherapy: Secondary | ICD-10-CM | POA: Diagnosis not present

## 2017-06-17 DIAGNOSIS — C519 Malignant neoplasm of vulva, unspecified: Secondary | ICD-10-CM

## 2017-06-17 MED ORDER — HEPARIN SOD (PORK) LOCK FLUSH 100 UNIT/ML IV SOLN
250.0000 [IU] | Freq: Once | INTRAVENOUS | Status: DC
  Filled 2017-06-17: qty 5

## 2017-06-17 MED ORDER — PEGFILGRASTIM-CBQV 6 MG/0.6ML ~~LOC~~ SOSY
PREFILLED_SYRINGE | SUBCUTANEOUS | Status: AC
  Filled 2017-06-17: qty 0.6

## 2017-06-17 MED ORDER — SODIUM CHLORIDE 0.9% FLUSH
10.0000 mL | Freq: Once | INTRAVENOUS | Status: DC
  Filled 2017-06-17: qty 10

## 2017-06-17 MED ORDER — PEGFILGRASTIM-CBQV 6 MG/0.6ML ~~LOC~~ SOSY
6.0000 mg | PREFILLED_SYRINGE | Freq: Once | SUBCUTANEOUS | Status: AC
  Administered 2017-06-17: 6 mg via SUBCUTANEOUS

## 2017-06-17 NOTE — Telephone Encounter (Signed)
Patient stated she got it filled by Dr Alvy Bimler because with it being a holiday weekend she was scared she would not be able to get it from [place] and be out but next time she wants to get it from you.

## 2017-06-17 NOTE — Telephone Encounter (Signed)
Patient's son stopped by and moved the appt from 6/6 to 6/12.

## 2017-06-17 NOTE — Patient Instructions (Signed)
Pegfilgrastim injection What is this medicine? PEGFILGRASTIM (PEG fil gra stim) is a long-acting granulocyte colony-stimulating factor that stimulates the growth of neutrophils, a type of white blood cell important in the body's fight against infection. It is used to reduce the incidence of fever and infection in patients with certain types of cancer who are receiving chemotherapy that affects the bone marrow, and to increase survival after being exposed to high doses of radiation. This medicine may be used for other purposes; ask your health care provider or pharmacist if you have questions. COMMON BRAND NAME(S): Neulasta What should I tell my health care provider before I take this medicine? They need to know if you have any of these conditions: -kidney disease -latex allergy -ongoing radiation therapy -sickle cell disease -skin reactions to acrylic adhesives (On-Body Injector only) -an unusual or allergic reaction to pegfilgrastim, filgrastim, other medicines, foods, dyes, or preservatives -pregnant or trying to get pregnant -breast-feeding How should I use this medicine? This medicine is for injection under the skin. If you get this medicine at home, you will be taught how to prepare and give the pre-filled syringe or how to use the On-body Injector. Refer to the patient Instructions for Use for detailed instructions. Use exactly as directed. Tell your healthcare provider immediately if you suspect that the On-body Injector may not have performed as intended or if you suspect the use of the On-body Injector resulted in a missed or partial dose. It is important that you put your used needles and syringes in a special sharps container. Do not put them in a trash can. If you do not have a sharps container, call your pharmacist or healthcare provider to get one. Talk to your pediatrician regarding the use of this medicine in children. While this drug may be prescribed for selected conditions,  precautions do apply. Overdosage: If you think you have taken too much of this medicine contact a poison control center or emergency room at once. NOTE: This medicine is only for you. Do not share this medicine with others. What if I miss a dose? It is important not to miss your dose. Call your doctor or health care professional if you miss your dose. If you miss a dose due to an On-body Injector failure or leakage, a new dose should be administered as soon as possible using a single prefilled syringe for manual use. What may interact with this medicine? Interactions have not been studied. Give your health care provider a list of all the medicines, herbs, non-prescription drugs, or dietary supplements you use. Also tell them if you smoke, drink alcohol, or use illegal drugs. Some items may interact with your medicine. This list may not describe all possible interactions. Give your health care provider a list of all the medicines, herbs, non-prescription drugs, or dietary supplements you use. Also tell them if you smoke, drink alcohol, or use illegal drugs. Some items may interact with your medicine. What should I watch for while using this medicine? You may need blood work done while you are taking this medicine. If you are going to need a MRI, CT scan, or other procedure, tell your doctor that you are using this medicine (On-Body Injector only). What side effects may I notice from receiving this medicine? Side effects that you should report to your doctor or health care professional as soon as possible: -allergic reactions like skin rash, itching or hives, swelling of the face, lips, or tongue -dizziness -fever -pain, redness, or irritation at site   where injected -pinpoint red spots on the skin -red or dark-brown urine -shortness of breath or breathing problems -stomach or side pain, or pain at the shoulder -swelling -tiredness -trouble passing urine or change in the amount of urine Side  effects that usually do not require medical attention (report to your doctor or health care professional if they continue or are bothersome): -bone pain -muscle pain This list may not describe all possible side effects. Call your doctor for medical advice about side effects. You may report side effects to FDA at 1-800-FDA-1088. Where should I keep my medicine? Keep out of the reach of children. Store pre-filled syringes in a refrigerator between 2 and 8 degrees C (36 and 46 degrees F). Do not freeze. Keep in carton to protect from light. Throw away this medicine if it is left out of the refrigerator for more than 48 hours. Throw away any unused medicine after the expiration date. NOTE: This sheet is a summary. It may not cover all possible information. If you have questions about this medicine, talk to your doctor, pharmacist, or health care provider.  2018 Elsevier/Gold Standard (2016-01-04 12:58:03)  

## 2017-06-18 ENCOUNTER — Other Ambulatory Visit: Payer: Self-pay | Admitting: Gynecologic Oncology

## 2017-06-18 DIAGNOSIS — R238 Other skin changes: Secondary | ICD-10-CM

## 2017-06-18 MED ORDER — COLLAGENASE 250 UNIT/GM EX OINT: 1.0000 "application " | TOPICAL_OINTMENT | Freq: Every day | CUTANEOUS | 1 refills | Status: AC

## 2017-06-18 NOTE — Telephone Encounter (Signed)
Pt returned call and stated she would let us know when her next refill is due

## 2017-06-18 NOTE — Telephone Encounter (Signed)
Ok. Thanks for notifying us. This is fine since she is her oncologist.  Let us know when next refill is needed.

## 2017-06-18 NOTE — Progress Notes (Signed)
Spoke with Larene Beach with [facility].  She is recommending extending the patient's home health care by four weeks with a once a week visit to assess surgical wound.  She is also recommending santyl ointment to be applied to the wound bed once daily.

## 2017-06-18 NOTE — Telephone Encounter (Signed)
Tried to call no answer

## 2017-06-20 DIAGNOSIS — Z79891 Long term (current) use of opiate analgesic: Secondary | ICD-10-CM | POA: Diagnosis not present

## 2017-06-20 DIAGNOSIS — N764 Abscess of vulva: Secondary | ICD-10-CM | POA: Diagnosis not present

## 2017-06-20 DIAGNOSIS — Z7984 Long term (current) use of oral hypoglycemic drugs: Secondary | ICD-10-CM | POA: Diagnosis not present

## 2017-06-20 DIAGNOSIS — Z483 Aftercare following surgery for neoplasm: Secondary | ICD-10-CM | POA: Diagnosis not present

## 2017-06-20 DIAGNOSIS — Z96642 Presence of left artificial hip joint: Secondary | ICD-10-CM | POA: Diagnosis not present

## 2017-06-20 DIAGNOSIS — Z96651 Presence of right artificial knee joint: Secondary | ICD-10-CM | POA: Diagnosis not present

## 2017-06-20 DIAGNOSIS — C519 Malignant neoplasm of vulva, unspecified: Secondary | ICD-10-CM | POA: Diagnosis not present

## 2017-06-23 ENCOUNTER — Encounter: Payer: Self-pay | Admitting: Nurse Practitioner

## 2017-06-23 ENCOUNTER — Ambulatory Visit (INDEPENDENT_AMBULATORY_CARE_PROVIDER_SITE_OTHER): Payer: PPO | Admitting: Nurse Practitioner

## 2017-06-23 VITALS — BP 104/70 | Ht 64.0 in | Wt 184.0 lb

## 2017-06-23 DIAGNOSIS — R7303 Prediabetes: Secondary | ICD-10-CM | POA: Diagnosis not present

## 2017-06-23 DIAGNOSIS — I10 Essential (primary) hypertension: Secondary | ICD-10-CM | POA: Diagnosis not present

## 2017-06-23 LAB — POCT GLYCOSYLATED HEMOGLOBIN (HGB A1C): Hemoglobin A1C: 5.2 % (ref 4.0–5.6)

## 2017-06-24 ENCOUNTER — Encounter: Payer: Self-pay | Admitting: Nurse Practitioner

## 2017-06-24 NOTE — Progress Notes (Signed)
Subjective: Presents for recheck on her hypertension and prediabetes.  Has had a complex medical history including vulvar cancer with continued pain and wound issues.  Is being followed by oncology and home health.  Also was hospitalized in March for GI bleeding, doing much better.  Continues to struggle with her energy level.  Walks around her home and does brief household duties such as loading the dishwasher and doing laundry but has to frequently rest.  States her husband has been helping her more around the house.  No chest pain/ischemic type pain.  Mild edema at times.  Has been trying to eat on a regular basis.  Rarely uses any type of supplement.  Remains in extreme pain with some control with oxycodone 10.  Also taking gabapentin.  Her last pain prescription was given to her by oncology, patient contacted the office to let us know.  Her son is been very helpful in taking her to her appointments.  Objective:   BP 104/70   Ht 5\' 4"  (1.626 m)   Wt 184 lb 0.4 oz (83.5 kg)   BMI 31.59 kg/m  NAD.  Alert, oriented.  Extremely pale.  Lungs clear.  Heart regular rate and rhythm.  No change in her heart murmur.  Lower extremities trace pitting edema.  Partial view of the upper GU area shows healing tissue with a large amount of packing in the right vulva. Results for orders placed or performed in visit on 06/23/17  POCT glycosylated hemoglobin (Hb A1C)  Result Value Ref Range   Hemoglobin A1C 5.2 4.0 - 5.6 %   HbA1c, POC (prediabetic range)  5.7 - 6.4 %   HbA1c, POC (controlled diabetic range)  0.0 - 7.0 %     Assessment:   Problem List Items Addressed This Visit      Cardiovascular and Mediastinum   Essential hypertension, benign - Primary (Chronic)     Other   Prediabetes (Chronic)   Relevant Orders   POCT glycosylated hemoglobin (Hb A1C) (Completed)       Plan: Encouraged activity as tolerated including walking around her house and performing light household chores.  Continue  follow-up with her specialist and home health as planned.  Will be seeing her oncologist next week.  Encouraged regular supplements such as Glucerna. Return in about 4 months (around 10/23/2017) for recheck.

## 2017-06-26 ENCOUNTER — Ambulatory Visit: Payer: PPO | Admitting: Gynecologic Oncology

## 2017-06-27 ENCOUNTER — Other Ambulatory Visit: Payer: Self-pay | Admitting: Nurse Practitioner

## 2017-06-27 ENCOUNTER — Other Ambulatory Visit: Payer: PPO

## 2017-06-27 ENCOUNTER — Ambulatory Visit: Payer: PPO

## 2017-06-27 ENCOUNTER — Telehealth: Payer: Self-pay | Admitting: *Deleted

## 2017-06-27 ENCOUNTER — Ambulatory Visit: Payer: PPO | Admitting: Hematology and Oncology

## 2017-06-27 MED ORDER — HYDROCODONE-ACETAMINOPHEN 10-325 MG PO TABS: 1.0000 | ORAL_TABLET | Freq: Three times a day (TID) | ORAL | 0 refills | Status: DC | PRN

## 2017-06-27 NOTE — Telephone Encounter (Signed)
Two options: we can increase the strength of her Hydrocodone to 10 mg (double the dose); Or we can go back to the oxycodone that she was on before. Let me know which she prefers and I will send in.

## 2017-06-27 NOTE — Telephone Encounter (Signed)
Pt states she would like to stay on what she is on Hydrocodone, just double the dose. Would like it sent to [facility] and she wants them to charge it and deliver it.

## 2017-06-27 NOTE — Telephone Encounter (Signed)
Patient called stating the hydrocodone is fine and some days it doesn't help, patient states yesterday was terrible with the burning. Patient asked for something stronger even if it is only for the worse days. Please advise

## 2017-06-27 NOTE — Telephone Encounter (Signed)
Seen 06/23/2017

## 2017-06-30 ENCOUNTER — Other Ambulatory Visit: Payer: Self-pay | Admitting: Family Medicine

## 2017-06-30 NOTE — Telephone Encounter (Signed)
Done

## 2017-07-02 ENCOUNTER — Encounter: Payer: Self-pay | Admitting: Oncology

## 2017-07-02 ENCOUNTER — Encounter: Payer: Self-pay | Admitting: Gynecologic Oncology

## 2017-07-02 ENCOUNTER — Inpatient Hospital Stay: Payer: PPO | Attending: Hematology and Oncology | Admitting: Gynecologic Oncology

## 2017-07-02 VITALS — BP 106/46 | HR 118 | Temp 97.6°F | Resp 20 | Ht 64.0 in | Wt 179.6 lb

## 2017-07-02 DIAGNOSIS — E1140 Type 2 diabetes mellitus with diabetic neuropathy, unspecified: Secondary | ICD-10-CM

## 2017-07-02 DIAGNOSIS — Z7689 Persons encountering health services in other specified circumstances: Secondary | ICD-10-CM | POA: Diagnosis not present

## 2017-07-02 DIAGNOSIS — Z90722 Acquired absence of ovaries, bilateral: Secondary | ICD-10-CM | POA: Insufficient documentation

## 2017-07-02 DIAGNOSIS — C519 Malignant neoplasm of vulva, unspecified: Secondary | ICD-10-CM | POA: Diagnosis not present

## 2017-07-02 DIAGNOSIS — Z923 Personal history of irradiation: Secondary | ICD-10-CM | POA: Diagnosis not present

## 2017-07-02 DIAGNOSIS — Z5111 Encounter for antineoplastic chemotherapy: Secondary | ICD-10-CM | POA: Insufficient documentation

## 2017-07-02 DIAGNOSIS — D509 Iron deficiency anemia, unspecified: Secondary | ICD-10-CM | POA: Insufficient documentation

## 2017-07-02 DIAGNOSIS — Z90710 Acquired absence of both cervix and uterus: Secondary | ICD-10-CM

## 2017-07-02 DIAGNOSIS — R102 Pelvic and perineal pain: Secondary | ICD-10-CM | POA: Insufficient documentation

## 2017-07-02 DIAGNOSIS — Z7984 Long term (current) use of oral hypoglycemic drugs: Secondary | ICD-10-CM | POA: Diagnosis not present

## 2017-07-02 DIAGNOSIS — C775 Secondary and unspecified malignant neoplasm of intrapelvic lymph nodes: Secondary | ICD-10-CM

## 2017-07-02 MED ORDER — MORPHINE SULFATE ER 15 MG PO TBCR: 15.0000 mg | EXTENDED_RELEASE_TABLET | Freq: Two times a day (BID) | ORAL | 0 refills | Status: DC

## 2017-07-02 MED ORDER — HYDROCODONE-ACETAMINOPHEN 10-325 MG PO TABS: 1.0000 | ORAL_TABLET | ORAL | 0 refills | Status: DC | PRN

## 2017-07-02 NOTE — Progress Notes (Signed)
Follow-up Note: Gyn-Onc  Consult was requested by Dr. Elonda Husky for the evaluation of [name] 77 y.o. female   CC:  Chief Complaint  Patient presents with  . Vulva cancer [facility])    Assessment/Plan:  [name]  is a 77 y.o.  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 S/p adjuvant vulvar and inguinal (bilateral) radiation. Recurrent vulvar squamous cell carcinoma. Declined exenteration. Receiving palliative chemotherapy.   Neuropathic pain in right groin (not apparent directly involved with recurrence)- already on Neurontin -recommended to continue 3 times per day. Pain in skin from malignancy progressing - prescribed MS contin '15mg'$  BID and hydrocodone prn q 4 hours. Counseled patient regarding risks of overdose and addiction, and safe storage.  Continue chemotherapy.   HPI: [name] is a 77 year old woman who is seen in consultation at the request of Dr Elonda Husky for a right labial lesion.    The patient reports that she began feeling pruritis of the vulva in October, 2017. She was seen by her PCP who saw no lesion and prescribed estradiol.  The pruritis persisted and became worse and she began to feel a nodule in the area and therefore again saw her PCP followed by a referral to gynecologist, Dr Elonda Husky on 02/20/16. The nodular lesion was appreciated on the right vulva. No biopsy was taken at that time. Biopsy was performed on 02/22/16 and pathology from biopsy confirmed invasive SCC.   CT scan abdo/pelvis was negative for evidence of suspicious nodes or distant mets.  On 02/22/16 she underwent right radical vulvectomy and right inguinal lymphadenectomy with Dr Nancy Marus. Final pathology revealed: a 2.1cm moderately differentiated SCC of the right vulva. Margins were negative (closest was 3 o'clock, 2m). Depth of invasion was 635m 1 of 6 inguinofemoral lymph nodes (right) was positive for  metastatic disease.   CT chest on 04/01/16 showed: Multiple pulmonary nodules as detailed above, generally in the 60m110mnd less average diameter range although with 1 right lower lobe subpleural nodule measuring about 8 mm in diameter. These likely warrant surveillance. Hypodense right thyroid nodule, 1.1 cm in long axis. Consider further evaluation with thyroid ultrasound. If patient is clinically hyperthyroid, consider nuclear medicine thyroid uptake and scan. Stable hypodense lateral splenic lesion, likely a cyst.  Postoperatively after her first vulvectomy she developed irritation on the left vulvar lip. She saw us [facility] evaluation in March and complained of this irritation. A nodular lesion was seen in the mid portion of the left labia minora in a "kissing" location to where the primary lesion had been. It was biopsied and confirmed as recurrent SCC of the vulva.  On 04/09/16 she was again taken to the OR, this time for a radical left vulvectomy. A decision was made to not perform left inguinal lymphadenectomy, so as to not delay radiation further (with an additional groin wound to heal), but to instead extend the radiation to both groins.  Pathology from this surgery confirmed a poorly differentiated squamous cell carcinoma of the left vulva with 60mm83m invasion. The inferior 6 o'clock margin was "broadly" positive (though macroscopically this margin was grossly negative.  She was recommended to receive adjuvant radiation to the vulva and groins. Radiation treatment dates of 06/05/16-07/25/16 where she received 45Gy in 25fr67fons directed at the bilateral inguinal and vulvar area. The patient then proceeded with a boost to the vulvar region the light of the positive surgical margin  and continue to a dose of 63 gray.  She developed burning and itching on the anterior right vulva in August, 2018 and a 2cm lesion was seen on the anterior vulva approximating the urethral meatus. It was biopsied in  September, 2018 and this confirmed recurrent SCC.  On 10/17/16 she underwent radical anterior vulvectomy. A 3cm raised vulvar lesion was seen at the midline and right vulva extending to within 0.5cm (macroscopically) to the urethral meatus.  Final pathology confirmed SCC, 2.7cm with 86m invasion, negative magins though there was a very close (135m urethral margin.  Repeat Chest CT in September, 2018 showed stable benign appearing nodules.  She saw Dr KiSondra Comen December, 2018 and there was concern for possible recurrence at the periurethral incision area due to necrosis.  She was being followed for a 1cm mass at the mons pubis incision.  She has noted this area getting larger and with burning sensation when it touches the fabric of her clothes.   A 2.5cm fluctuant area was noted to the right of the midline mons incision. It was biopsied on 02/17/17 with pathology showing no recurrence only inflammatory changes consistent with an abscess.  She had some initial mild relief to the office I&D, but subseqently developed increasing pain again. She denies fevers. She has separate right inguinal crease neuralgia (burning pain). Minimal relief from gabapentin BID.   She was discharged from hospital on 04/22/17 for a GI bleed after receiving transfusion. No imaging performed. She had a GI bleed in April, 2018 with a colonoscopy and swallow study which showed no source.  Continued groin burning.   We ordered a CT scan on 05/01/17 to evaluate for occult source of GI bleeding. It demonstrated a 4+cm area of gas filled tissue on the right anterior mons consistent with either abscess vs recurrent necrotic tumor.  Due to the concern that this abscess represented recurrent cancer, we performed an exam under anesthesia, debridement of vulvar abscess and biopsy of abscess cavity with vulvar biopsies on 05/05/17.  The operative findings were very concerning for recurrence of her malignancy at the anterior vulva.  There  is a small nodular area appreciated in the mid left labia majora at the inferior aspect of 1 of her prior incision sites.  This was biopsied.  An additional nodule was appreciated in the posterior right labia majora this was also biopsied.  Final pathology from the surgery returned as recurrent invasive moderate to poorly differentiated squamous cell carcinoma with an associated ulcer necrosis and acute inflammation at the anterior right mons pubis lesion.  The left mid labial lesion was positive for poorly differentiated squamous cell carcinoma.  The right labia majora lesion was benign.  This pathology finding represented a new recurrence x2 from this area. She was offered total exenteration with flaps, however, declined due the morbidity of the procedure and the high probability for recurrence given her past history.  She elected for palliative chemotherapy to control the disease.   Interval Hx:  She has increasing pain in the vulva, not relieved by hydrocodone every 8 hours. She is s/p starting carboplatin and paclitaxel (cycle 1  Current Meds:  Outpatient Encounter Medications as of 07/02/2017  Medication Sig  . blood glucose meter kit and supplies KIT Dispense based on patient and insurance preference. Use up to three times daily as directed (FOR ICD-10 E11.9).  . collagenase (SANTYL) ointment Apply 1 application topically daily. Apply to vulva wound bed once daily with dressing changes  . dexamethasone (DECADRON)  4 MG tablet Take 3 tabs the night before and 3 tabs the morning of chemotherapy, every 3 weeks, with food  . ferrous sulfate 325 (65 FE) MG tablet Take 1 tablet (325 mg total) by mouth daily with breakfast.  . gabapentin (NEURONTIN) 300 MG capsule Take 1 capsule (300 mg total) by mouth 3 (three) times daily. (Patient taking differently: Take 300 mg by mouth 3 (three) times daily. )  . HYDROcodone-acetaminophen (NORCO) 10-325 MG tablet Take 1 tablet by mouth every 4 (four) hours as  needed.  Marland Kitchen losartan (COZAAR) 50 MG tablet TAKE ONE TABLET BY MOUTH ONCE DAILY.--- takes in am  . metFORMIN (GLUCOPHAGE) 500 MG tablet TAKE (1) TABLET BY MOUTH TWICE DAILY.  Marland Kitchen ondansetron (ZOFRAN) 8 MG tablet Take 1 tablet (8 mg total) by mouth 2 (two) times daily as needed for refractory nausea / vomiting. Start on day 3 after chemo.  . ondansetron (ZOFRAN) 8 MG tablet Take 1 tablet (8 mg total) by mouth every 8 (eight) hours as needed for nausea.  Marland Kitchen oxyCODONE-acetaminophen (PERCOCET) 10-325 MG tablet Take 1 tablet by mouth every 4 (four) hours as needed for pain.  Marland Kitchen prochlorperazine (COMPAZINE) 10 MG tablet Take 1 tablet (10 mg total) by mouth every 6 (six) hours as needed (Nausea or vomiting).  . prochlorperazine (COMPAZINE) 10 MG tablet Take 1 tablet (10 mg total) by mouth every 6 (six) hours as needed for nausea or vomiting.  . sodium chloride flush 0.9 % SOLN injection Inject 10 mLs into the vein daily.  . [DISCONTINUED] HYDROcodone-acetaminophen (NORCO) 10-325 MG tablet Take 1 tablet by mouth every 8 (eight) hours as needed.  Marland Kitchen morphine (MS CONTIN) 15 MG 12 hr tablet Take 1 tablet (15 mg total) by mouth every 12 (twelve) hours.  . potassium chloride SA (K-DUR,KLOR-CON) 20 MEQ tablet TAKE 1 TABLET BY MOUTH ONCE A DAY. (Patient taking differently: TAKE 1 TABLET BY MOUTH ONCE A DAY.  --- takes in am)   No facility-administered encounter medications on file as of 07/02/2017.     Allergy:  Allergies  Allergen Reactions  . Oxycodone Other (See Comments)    Intense H/A  . Celebrex [Celecoxib] Other (See Comments)    GI upset   . Doxycycline Nausea Only    Gi upset Pt is able to tolerate with food  . Iron Other (See Comments)    Pt cannot tolerate some iron preparations. The upset her stomach.  . Lodine [Etodolac] Nausea Only    Gi upset  . Nabumetone Other (See Comments)    (relafen)  Gi upset  . Tetracycline Nausea And Vomiting  . Xarelto [Rivaroxaban] Hives    Social Hx:    Social History   Socioeconomic History  . Marital status: Married    Spouse name: Mortimer Fries  . Number of children: 2  . Years of education: Not on file  . Highest education level: Not on file  Occupational History  . Occupation: Retired Geographical information systems officer  . Financial resource strain: Not on file  . Food insecurity:    Worry: Not on file    Inability: Not on file  . Transportation needs:    Medical: Not on file    Non-medical: Not on file  Tobacco Use  . Smoking status: Never Smoker  . Smokeless tobacco: Never Used  Substance and Sexual Activity  . Alcohol use: No    Alcohol/week: 0.0 oz  . Drug use: No  . Sexual activity: Not on file  Lifestyle  . Physical activity:    Days per week: Not on file    Minutes per session: Not on file  . Stress: Not on file  Relationships  . Social connections:    Talks on phone: Not on file    Gets together: Not on file    Attends religious service: Not on file    Active member of club or organization: Not on file    Attends meetings of clubs or organizations: Not on file    Relationship status: Not on file  . Intimate partner violence:    Fear of current or ex partner: Not on file    Emotionally abused: Not on file    Physically abused: Not on file    Forced sexual activity: Not on file  Other Topics Concern  . Not on file  Social History Narrative  . Not on file    Past Surgical Hx:  Past Surgical History:  Procedure Laterality Date  . ABDOMINAL HYSTERECTOMY  1979   W/ BILATERAL SALPINGOOPHORECTOMY  . ANTERIOR CERVICAL DECOMP/DISCECTOMY FUSION  11-09-2002   dr Hal Neer  [facility]   C3--6  . CATARACT EXTRACTION W/ INTRAOCULAR LENS  IMPLANT, BILATERAL  yrs ago  . COLONOSCOPY N/A 05/05/2016   Procedure: COLONOSCOPY;  Surgeon: Daneil Dolin, MD;  [facility];  Service: Endoscopy;  Laterality: N/A;  . DEBRIDEMENT AND CLOSURE WOUND N/A 05/15/2017   Procedure: DEBRIDEMENT VULVA  WOUND;  Surgeon: Everitt Amber, MD;   Location: [facility];  Service: Gynecology;  Laterality: N/A;  . ESOPHAGOGASTRODUODENOSCOPY N/A 05/04/2016   Procedure: ESOPHAGOGASTRODUODENOSCOPY (EGD);  Surgeon: Daneil Dolin, MD;  [facility];  Service: Endoscopy;  Laterality: N/A;  . ESOPHAGOGASTRODUODENOSCOPY N/A 04/21/2017   Procedure: ESOPHAGOGASTRODUODENOSCOPY (EGD);  Surgeon: Ladene Artist, MD;  Location: [facility];  Service: Endoscopy;  Laterality: N/A;  . GIVENS CAPSULE STUDY N/A 05/06/2016   Procedure: GIVENS CAPSULE STUDY;  Surgeon: Daneil Dolin, MD;  [facility]: [facility];  Service: Endoscopy;  Laterality: N/A;  . KNEE ARTHROSCOPY Left 1980s;   05-18-2002   dr Rhona Raider  [facility]  . LUMBAR SPINE SURGERY  1991    dr Tonita Cong  . RADICAL VULVECTOMY Left 04/09/2016   Procedure: RADICAL LEFT VULVECTOMY;  Surgeon: Everitt Amber, MD;  Location: [facility];  Service: Gynecology;  Laterality: Left;  . RADICAL VULVECTOMY  10/17/2016   Procedure: RADICAL ANTERIOR VULVECTOMY;  Surgeon: Everitt Amber, MD;  Location: [facility];  Service: Gynecology;;  . TOE SURGERY Left   . TONSILLECTOMY  1967  . TOTAL HIP ARTHROPLASTY Left 10/27/2013   Procedure: LEFT TOTAL HIP ARTHROPLASTY;  Surgeon: Tobi Bastos, MD;  Location: [facility];  Service: Orthopedics;  Laterality: Left;  . TOTAL KNEE ARTHROPLASTY Right 03-28-2010    dr Gladstone Lighter  [facility]  . TRANSTHORACIC ECHOCARDIOGRAM  10/31/2014   mild focal basal hypertrophy of the septum, ef 22-97%, grade 1 diastolic dysfunction/  moderate AV stenosis, valve area 1.46cm^2/  trivial MR/  mild PR and TR/  mild RAE/ PASP 50mHg  . VULVA /PERINEUM BIOPSY  02/22/2016  . VULVA /Milagros LollBIOPSY N/A 05/15/2017   Procedure: VULVAR BIOPSY;  Surgeon: REveritt Amber MD;  [facility]  Service: Gynecology;  Laterality: N/A;  . VULVECTOMY Right 02/27/2016   Procedure: RADICAL RIGHT VULVECTOMY WITH RIGHT INGUINAL LYMPH NODE DISSECTION;  Surgeon: PNancy Marus MD;  Location: [facility];  Service:  Gynecology;  Laterality: Right;    Past Medical Hx:  Past Medical History:  Diagnosis Date  . Aortic valve stenosis, mild    per last echo in epic dated 10-31-2014  mild to moderate AVS,  valve area 1.46cm^2  . Arthritis   . Heart murmur   . History of GI bleed 04/ 2018;  04-20-2017   04/ 2018  upper GI, normal EGD, x1 unit PRBCs/  04-20-2017  upper GI, hg 5.5, normal EGD, x2 units PRBCs  . History of peptic ulcer   . History of radiation therapy 06/05/16 - 07/25/16   Pelvis (vulvar and bilateral inguinal) treated to 45 Gy in 25 fractions, then Boosted an additional 18 Gy in 9 fractions, vulvar region  . History of rheumatic fever age 4  . Hypertension   . Microcytic anemia   . Neuropathic pain    right goin  . PONV (postoperative nausea and vomiting)    years ago with back surgery-nausea  . Pulmonary nodules    last CT  10-07-2016, benign  . Recurrent vulvar cancer [facility]) oncologist-- dr Tine Mabee/  dr Lurline Idol   dx 01/ 2018  s/p vulvectomy 02-27-2016 , 04-09-2016 & 10-17-2016-- completed external radiation 07-25-2016:  recurrent Stage 3A SCC bilateral vulvar with positive right inguinal node  . Type 2 diabetes mellitus ([place])   . Urinary incontinence due to urethral sphincter incompetence    weakness post vulvar radiation  . Vitamin D deficiency     Past Gynecological History:  Remote hx of hysterectomy for fibroids. No hx of abnromal paps (last pap remote) No LMP recorded. Patient has had a hysterectomy.  Family Hx:  Family History  Problem Relation Age of Onset  . Hypertension Mother   . Hypertension Father   . Throat cancer Father   . Cancer Sister        Breast  . Hypertension Brother     Review of Systems:  Constitutional  Feels well,    ENT Normal appearing ears and nares bilaterally Skin/Breast  + vulvar pruritis. Cardiovascular  No chest pain, shortness of breath, or edema  Pulmonary  No cough or wheeze.  Gastro Intestinal  No nausea, vomitting, or  diarrhoea. No bright red blood per rectum, no abdominal pain, change in bowel movement, or constipation.  Genito Urinary  No frequency, urgency, dysuria, no bleeding Musculo Skeletal  No myalgia, arthralgia, joint swelling or pain  Neurologic  No weakness, numbness, change in gait,  Psychology  No depression, anxiety, insomnia.   Vitals:  Blood pressure (!) 106/46, pulse (!) 118, temperature 97.6 F (36.4 C), temperature source Oral, resp. rate 20, height '5\' 4"'$  (1.626 m), weight 179 lb 9.6 oz (81.5 kg), SpO2 100 %.  Physical Exam: WD in NAD Neck  Supple NROM, without any enlargements.  Lymph Node Survey No cervical supraclavicular or inguinal adenopathy. Right groin incision well healed.  Cardiovascular  Pulse normal rate, regularity and rhythm. S1 and S2 normal.  Lungs  Clear to auscultation bilateraly, without wheezes/crackles/rhonchi. Good air movement.  Skin  No rash/lesions/breakdown  Psychiatry  Alert and oriented to person, place, and time  Abdomen  Normoactive bowel sounds, abdomen soft, non-tender and obese without evidence of hernia.  Back No CVA tenderness Genito Urinary  Vulva/vagina: Radiation changes present. 5cm cancer on right anterior mons pubis with opened epithelium, packed with iodoform, granulating well. There is no longer evidence of cellulitis surrouding right anterior mons pubis biopsy site. There remains nodularity at the left inferior/mid labia majora at the 2nd site of recurrence.   There is palpable tumor  around the labia majora bilaterally. Necrosis at base of ulcerated areas. No cellulitis.    Bladder/urethra:  No lesions or masses, well supported bladder  Vagina: atrophic, no lesions  Cervix: surgically absent  Uterus: surgically absen  Adnexa: no palpable masses. Rectal  deferred Extremities  No bilateral cyanosis, clubbing or edema.   Thereasa Solo, MD  07/02/2017, 12:07 PM

## 2017-07-02 NOTE — Patient Instructions (Signed)
Take morphine sulfate long acting pain medication once every morning and night (long acting pain medication). Take short acting hydrocodone medication every 4 hours if needed for pain.

## 2017-07-04 ENCOUNTER — Inpatient Hospital Stay: Payer: PPO

## 2017-07-04 ENCOUNTER — Inpatient Hospital Stay (HOSPITAL_BASED_OUTPATIENT_CLINIC_OR_DEPARTMENT_OTHER): Payer: PPO | Admitting: Hematology and Oncology

## 2017-07-04 ENCOUNTER — Other Ambulatory Visit: Payer: Self-pay | Admitting: Hematology and Oncology

## 2017-07-04 ENCOUNTER — Telehealth: Payer: Self-pay | Admitting: Hematology and Oncology

## 2017-07-04 ENCOUNTER — Encounter: Payer: Self-pay | Admitting: Hematology and Oncology

## 2017-07-04 DIAGNOSIS — Z7984 Long term (current) use of oral hypoglycemic drugs: Secondary | ICD-10-CM

## 2017-07-04 DIAGNOSIS — D509 Iron deficiency anemia, unspecified: Secondary | ICD-10-CM

## 2017-07-04 DIAGNOSIS — Z90722 Acquired absence of ovaries, bilateral: Secondary | ICD-10-CM

## 2017-07-04 DIAGNOSIS — C775 Secondary and unspecified malignant neoplasm of intrapelvic lymph nodes: Secondary | ICD-10-CM

## 2017-07-04 DIAGNOSIS — E1140 Type 2 diabetes mellitus with diabetic neuropathy, unspecified: Secondary | ICD-10-CM | POA: Diagnosis not present

## 2017-07-04 DIAGNOSIS — D62 Acute posthemorrhagic anemia: Secondary | ICD-10-CM

## 2017-07-04 DIAGNOSIS — C519 Malignant neoplasm of vulva, unspecified: Secondary | ICD-10-CM

## 2017-07-04 DIAGNOSIS — R102 Pelvic and perineal pain unspecified side: Secondary | ICD-10-CM

## 2017-07-04 DIAGNOSIS — Z5111 Encounter for antineoplastic chemotherapy: Secondary | ICD-10-CM | POA: Diagnosis not present

## 2017-07-04 DIAGNOSIS — Z90710 Acquired absence of both cervix and uterus: Secondary | ICD-10-CM | POA: Diagnosis not present

## 2017-07-04 DIAGNOSIS — Z923 Personal history of irradiation: Secondary | ICD-10-CM | POA: Diagnosis not present

## 2017-07-04 LAB — CBC WITH DIFFERENTIAL (CANCER CENTER ONLY)
BASOS ABS: 0 10*3/uL (ref 0.0–0.1)
Basophils Relative: 0 %
Eosinophils Absolute: 0 10*3/uL (ref 0.0–0.5)
Eosinophils Relative: 0 %
HEMATOCRIT: 31.8 % — AB (ref 34.8–46.6)
Hemoglobin: 10.2 g/dL — ABNORMAL LOW (ref 11.6–15.9)
LYMPHS ABS: 0.4 10*3/uL — AB (ref 0.9–3.3)
LYMPHS PCT: 6 %
MCH: 27.4 pg (ref 25.1–34.0)
MCHC: 32.2 g/dL (ref 31.5–36.0)
MCV: 85.2 fL (ref 79.5–101.0)
Monocytes Absolute: 0.1 10*3/uL (ref 0.1–0.9)
Monocytes Relative: 1 %
NEUTROS PCT: 93 %
Neutro Abs: 6.7 10*3/uL — ABNORMAL HIGH (ref 1.5–6.5)
Platelet Count: 211 10*3/uL (ref 145–400)
RBC: 3.73 MIL/uL (ref 3.70–5.45)
RDW: 18.2 % — ABNORMAL HIGH (ref 11.2–14.5)
WBC: 7.2 10*3/uL (ref 3.9–10.3)

## 2017-07-04 LAB — CMP (CANCER CENTER ONLY)
ALT: 9 U/L (ref 0–55)
ANION GAP: 11 (ref 3–11)
AST: 12 U/L (ref 5–34)
Albumin: 3.7 g/dL (ref 3.5–5.0)
Alkaline Phosphatase: 90 U/L (ref 40–150)
BUN: 21 mg/dL (ref 7–26)
CHLORIDE: 105 mmol/L (ref 98–109)
CO2: 25 mmol/L (ref 22–29)
Calcium: 9.8 mg/dL (ref 8.4–10.4)
Creatinine: 0.88 mg/dL (ref 0.60–1.10)
Glucose, Bld: 136 mg/dL (ref 70–140)
POTASSIUM: 4.9 mmol/L (ref 3.5–5.1)
Sodium: 141 mmol/L (ref 136–145)
TOTAL PROTEIN: 7 g/dL (ref 6.4–8.3)
Total Bilirubin: 0.3 mg/dL (ref 0.2–1.2)

## 2017-07-04 LAB — IRON AND TIBC
Iron: 37 ug/dL — ABNORMAL LOW (ref 41–142)
SATURATION RATIOS: 15 % — AB (ref 21–57)
TIBC: 249 ug/dL (ref 236–444)
UIBC: 211 ug/dL

## 2017-07-04 LAB — FERRITIN: Ferritin: 192 ng/mL (ref 9–269)

## 2017-07-04 LAB — SAMPLE TO BLOOD BANK

## 2017-07-04 MED ORDER — SODIUM CHLORIDE 0.9% FLUSH
10.0000 mL | INTRAVENOUS | Status: DC | PRN
  Filled 2017-07-04: qty 10

## 2017-07-04 MED ORDER — SODIUM CHLORIDE 0.9 % IV SOLN
131.2500 mg/m2 | Freq: Once | INTRAVENOUS | Status: AC
  Administered 2017-07-04: 252 mg via INTRAVENOUS
  Filled 2017-07-04: qty 42

## 2017-07-04 MED ORDER — HEPARIN SOD (PORK) LOCK FLUSH 100 UNIT/ML IV SOLN
500.0000 [IU] | Freq: Once | INTRAVENOUS | Status: AC | PRN
  Administered 2017-07-04: 250 [IU]
  Filled 2017-07-04: qty 5

## 2017-07-04 MED ORDER — HEPARIN SOD (PORK) LOCK FLUSH 100 UNIT/ML IV SOLN
500.0000 [IU] | Freq: Once | INTRAVENOUS | Status: AC
  Administered 2017-07-04: 500 [IU]
  Filled 2017-07-04: qty 5

## 2017-07-04 MED ORDER — PALONOSETRON HCL INJECTION 0.25 MG/5ML
INTRAVENOUS | Status: AC
  Filled 2017-07-04: qty 5

## 2017-07-04 MED ORDER — DIPHENHYDRAMINE HCL 50 MG/ML IJ SOLN
50.0000 mg | Freq: Once | INTRAMUSCULAR | Status: AC
  Administered 2017-07-04: 50 mg via INTRAVENOUS

## 2017-07-04 MED ORDER — SODIUM CHLORIDE 0.9 % IV SOLN
Freq: Once | INTRAVENOUS | Status: AC
  Administered 2017-07-04: 11:00:00 via INTRAVENOUS
  Filled 2017-07-04: qty 5

## 2017-07-04 MED ORDER — PALONOSETRON HCL INJECTION 0.25 MG/5ML
0.2500 mg | Freq: Once | INTRAVENOUS | Status: AC
  Administered 2017-07-04: 0.25 mg via INTRAVENOUS

## 2017-07-04 MED ORDER — SODIUM CHLORIDE 0.9% FLUSH
10.0000 mL | Freq: Once | INTRAVENOUS | Status: AC
  Administered 2017-07-04: 10 mL
  Filled 2017-07-04: qty 10

## 2017-07-04 MED ORDER — FAMOTIDINE IN NACL 20-0.9 MG/50ML-% IV SOLN
INTRAVENOUS | Status: AC
  Filled 2017-07-04: qty 50

## 2017-07-04 MED ORDER — FAMOTIDINE IN NACL 20-0.9 MG/50ML-% IV SOLN
20.0000 mg | Freq: Once | INTRAVENOUS | Status: AC
  Administered 2017-07-04: 20 mg via INTRAVENOUS

## 2017-07-04 MED ORDER — DIPHENHYDRAMINE HCL 50 MG/ML IJ SOLN
INTRAMUSCULAR | Status: AC
  Filled 2017-07-04: qty 1

## 2017-07-04 MED ORDER — SODIUM CHLORIDE 0.9 % IV SOLN
Freq: Once | INTRAVENOUS | Status: AC
  Administered 2017-07-04: 10:00:00 via INTRAVENOUS

## 2017-07-04 MED ORDER — SODIUM CHLORIDE 0.9 % IV SOLN
529.2000 mg | Freq: Once | INTRAVENOUS | Status: AC
  Administered 2017-07-04: 530 mg via INTRAVENOUS
  Filled 2017-07-04: qty 53

## 2017-07-04 NOTE — Assessment & Plan Note (Signed)
Overall, she tolerated chemotherapy very well Her wound is healing well She denies peripheral neuropathy from treatment She denies nausea or vomiting from treatment I will continue same dose without dose adjustment I recommend minimum 3 cycles of chemo before repeat imaging study

## 2017-07-04 NOTE — Assessment & Plan Note (Signed)
She has received blood transfusion and iron infusion I will repeat iron studies and give her further iron treatment if needed

## 2017-07-04 NOTE — Progress Notes (Signed)
[place] OFFICE PROGRESS NOTE  Patient Care Team: Mikey Kirschner, MD as PCP - General (Family Medicine)  ASSESSMENT & PLAN:  Vulva cancer ([place]) Overall, she tolerated chemotherapy very well Her wound is healing well She denies peripheral neuropathy from treatment She denies nausea or vomiting from treatment I will continue same dose without dose adjustment I recommend minimum 3 cycles of chemo before repeat imaging study  Iron deficiency anemia She has received blood transfusion and iron infusion I will repeat iron studies and give her further iron treatment if needed  Vulvar pain She has stable pain control on narcotic prescription We will continue similar medications for now  Diabetes mellitus with diabetic neuropathy, without long-term current use of insulin ([place]) She denies worsening peripheral neuropathy from treatment She will continue dietary modification and metformin   No orders of the defined types were placed in this encounter.   INTERVAL HISTORY: Please see below for problem oriented charting. She returns with her son for cycle 2 of chemotherapy She complained of mild fatigue Denies nausea, vomiting or changes in bowel habits from chemo Overall, her wound is healing well Her pain control is stable She denies worsening peripheral neuropathy from treatment Denies recent infection The patient denies any recent signs or symptoms of bleeding such as spontaneous epistaxis, hematuria or hematochezia.   SUMMARY OF ONCOLOGIC HISTORY:   Vulva cancer ([place])   02/22/2016 Pathology Results    Labium, biopsy, right mid - INVASIVE SQUAMOUS CELL CARCINOMA.      02/23/2016 Imaging    Ct abdomen and pelvis Mild asymmetric soft tissue thickening in the right vulvar/perineal region, likely corresponding to the patient's known vulvar cancer, poorly evaluated on CT.  No evidence of metastatic disease.  Status post hysterectomy.      02/27/2016  Pathology Results    1. Lymph nodes, regional resection, right inguinal - METASTATIC CARCINOMA IN 1 OF 6 LYMPH NODES (1/6). 2. Vulva, excision, right - INVASIVE KERATINIZING SQUAMOUS CELL CARCINOMA, MODERATELY DIFFERENTIATED, SPANNING 2.1 CM WITH A DEPTH OF INVASION OF 0.6 CM. - THE SURGICAL RESECTION MARGIN ARE NEGATIVE FOR CARCINOMA. - DIFFUSE LICHEN SCLEROSUS ET ATROPHICUS. - SEE ONCOLOGY TABLE BELOW. Microscopic Comment 2. VULVA Specimen: Right vulvectomy. Procedure: Resection Lymph node sampling performed: Yes. Tumor site: Right vulva Tumor focality: Carcinoma is unifocal. Maximum tumor size (cm): 2.1 cm (gross measurement). Histologic type: Keratinizing squmaous cell carcinoma. Grade: Moderately differentiated. Depth of stromal invasion (mm): 6 mm (glass slide measurement) Margins: Negative for carcinoma. Distance of carcinoma from nearest margin: 0.6 cm to the 3 o'clock margin. Lymph - Vascular invasion: Not identified. Lymph nodes: number examined: 6 Number positive: 1 size of metastatic focus: 0.3 cm (glass slide measurement) extracapsular extension: Not identified. TNM code: pT1b, pN1a FIGO Stage (based on pathologic findings, needs clinical correlation): IIIA Comment: Grossly, there is a 2.1 cm sessile papular lesion which on histologic evaluation reveals invasive keratinizing squamous cell carcinoma invading to a depth of 0.6 cm as measured from a glass slide. In addition, there is diffuse lichen sclerosus et atrophicus. The surgical resection margins are negative for invasive caricnoma. Additional studies can be performed upon clinical request.      02/27/2016 Surgery    Surgery: Right radical partial vulvectomy, right inguinal lymph node dissection  Surgeons:  Imagene Gurney A. Alycia Rossetti, MD; Lahoma Crocker, MD   Pathology: Right vulva with resection margin with suture at 12:00. Right inguinal nodes.   Operative findings: 1.5 cm fungating erosive lesion of the right  vulva approximately 2 cm from the urethra. Evidence of lichen sclerosis throughout the entire vulvar. No other ulcerative lesion suspicious for vulvar carcinoma. Shotty palpable lymphadenopathy in the right inguinal space.        04/01/2016 Imaging    Ct chest 1. Multiple pulmonary nodules as detailed above, generally in the 4 mm and less average diameter range although with 1 right lower lobe subpleural nodule measuring about 8 mm in diameter. These likely warrant surveillance. 2. Hypodense right thyroid nodule, 1.1 cm in long axis. Consider further evaluation with thyroid ultrasound. If patient is clinically hyperthyroid, consider nuclear medicine thyroid uptake and scan. 3. Stable hypodense lateral splenic lesion, likely a cyst. 4. Aortic and coronary atherosclerosis.       04/02/2016 Pathology Results    Labium, biopsy, left minora INVASIVE SQUAMOUS CELL CARCINOMA      04/09/2016 Pathology Results    1. Vulva, vulvectomy, left - INVASIVE SQUAMOUS CELL CARCINOMA, NON-KERATINIZING, SPANNING 2.4 CM IN WIDTH X 0.4 CM IN DEPTH. - CARCINOMA IS BROADLY PRESENT AT THE 6 O'CLOCK MARGIN OF SPECIMEN #1. - SEE ONCOLOGY TABLE BELOW. 2. Vulva, excision, lateral margin - SQUAMOUS LINED EPITHELIUM WITH LICHEN SCLEROSUS ET ATROPHICUS. 3. Vulva, excision, medial margin - SQUAMOUS LINED EPITHELIUM WITH LICHEN SCLEROSUS ET ATROPHICUS. Microscopic Comment 1. VULVA Specimen: Left vulva. Procedure: Left vulvectomy with additional lateral and medial margins. Lymph node sampling performed: Not on current case. Tumor site: Left vulva. Tumor focality: Unifocal. Maximum tumor size (cm): 2.4 cm in width (gross measurement) Histologic type: Squamous cell carcinoma, non-keratinizing. Grade: High grade. Depth of stromal invasion (mm): 0.4 cm (4 mm), glass slide measurement. Margins: Invasive carcinoma broadly present at the 6 o'clock margin of specimen #1. Lymph - Vascular invasion: Not identified. Lymph  nodes: None examined. TNM code: pT1b, pNX FIGO Stage (based on pathologic findings, needs clinical correlation): IB      04/09/2016 Surgery    Surgery: Partial radical left vulvectomy  Surgeons:  Donaciano Eva, MD  Pathology: 1/ left vulva with marking stitch at 12 o'clock anterior, 2/ lateral (thigh) margin with stitch at true new lateral margin. 3/ medial (vaginal) margin with marking stitch at true new medial margin. Operative findings: 2cm left mid labial nodular lesion        05/03/2016 Imaging    Ct chest 1.  No evidence of acute pulmonary embolus. 2. Calcified coronary artery atherosclerosis. Negative visible aorta. 3. Pulmonary atelectasis with no other acute pulmonary process. The small subpleural pulmonary nodules seen by CT last month are stable.       06/05/2016 - 07/25/2016 Radiation Therapy    Radiation treatment dates:   06/05/16 - 07/25/16  Site/dose:   Pelvis treated to 45 Gy in 25 fractions, then vulvar region Boosted an additional 18 Gy in 9 fractions.  Beams/energy:   Pelvis:  IMRT  //  6X                              Boost:  IMRT  //  6X       09/30/2016 Pathology Results    Vulva, biopsy, anterior vulva INVASIVE SQUAMOUS CELL CARCINOMA      10/07/2016 Imaging    Ct chest 1. Stable bilateral pulmonary nodules, likely benign but will require surveillance. Recommend followup noncontrast chest CT in 6-12 months. 2. No mediastinal or hilar mass or adenopathy. 3. Stable coronary artery calcifications.  Aortic Atherosclerosis (ICD10-I70.0).  10/17/2016 Pathology Results    Vulva, vulvectomy, anterior - INVASIVE SQUAMOUS CELL CARCINOMA, SPANNING 2.7 CM AND EXTENDING TO A DEPTH OF 0.3 CM. - RESECTION MARGINS ARE NEGATIVE. - SEE ONCOLOGY TABLE. Microscopic Comment VULVA Specimen: Anterior vulva. Procedure: Partial anterior vulvectomy. Lymph node sampling performed: None. Tumor site: Anterior vulva. Tumor focality: Unifocal. Maximum tumor  size (cm): 2.7 cm. Histologic type: Squamous cell carcinoma, non-keratinizing. Grade: G2, moderately differentiated. Depth of stromal invasion (mm): 3 mm. Margins: Negative Distance of carcinoma from nearest margin: Invasive tumor is 1 mm from urethral margin. Lymph - Vascular invasion: Not identified. Lymph nodes: None. TNM code: pT1b, pNX FIGO Stage (based on pathologic findings, needs clinical correlation): IB      10/17/2016 Surgery    Surgery: Partial radical anterior vulvectomy  Surgeons:  Donaciano Eva, MD  Pathology: anterior vulva with marking stitch at anterior clitoral margin o'clock  Operative findings: 3cm raised vulvar cancer (leukoplakia) at midline and right vulva extending to within 0.5cm of anterior urethral meatus        02/17/2017 Pathology Results    Vulva, biopsy - PARAKERATOSIS AND ACUTE INFLAMMATION. - PAS IS NEGATIVE FOR FUNGAL ORGANISMS. - NO DYSPLASIA OR MALIGNANCY.      05/01/2017 Imaging    Ct abdomen and pelvis New 4 x 3 cm rim enhancing lesion central air bubbles in the right vulvar region, which could represent locally recurrent tumor with necrosis or abscess.  No evidence of metastatic disease within the abdomen or pelvis.      05/15/2017 Pathology Results    1. Labium, biopsy, right posterior labia majora - PARAKERATOSIS. - NO DYSPLASIA OR MALIGNANCY. 2. Vulva, excision, anterior right mons pubis lesion - INVASIVE MODERATE TO POORLY DIFFERENTIATED SQUAMOUS CELL CARCINOMA. - ASSOCIATED ULCER, NECROSIS AND ACUTE INFLAMMATION. - SEE COMMENT. 3. Labium, biopsy, left mid labia majora - POORLY DIFFERENTIATED SQUAMOUS CELL CARCINOMA. - SEE COMMENT. Microscopic Comment 2. The specimen is disrupted hampering assessment of margin orientation and size, however, carcinoma involves almost the entire specimen (thus size estimated to be at least 3.1 cm with depth of 1 cm) and extends to the majority of the presumed lateral and deep margins.  There is perineural invasion. 3. The carcinoma is largely beneath the epidermis and extends to the deep and lateral edges of the biopsy.      05/15/2017 Surgery    Surgery: debridement of anterior vulvar abscess, vulvar biopsies.   Surgeons:  Donaciano Eva, MD  Pathology: right posterior labia majora, anterior mons pubis, left mid labia majora  Operative findings: 6cm cavitating anterior mons pubis indurated lesion. It is approximately 3.5cm from the urethral meatus to inferior aspect of mass. Two indurated 1-2cm nodular areas (one on the posterior right labia majora and one on the mid left labia majora). No periurethral lesions.        06/02/2017 PET scan    Stable hypermetabolic soft tissue density in right vulva, which may be due to postoperative change from recent excision, although residual malignancy and infection cannot be excluded.  No evidence of local or distant metastatic disease.      06/06/2017 Procedure    Successful right arm power PICC line placement with ultrasound and fluoroscopic guidance. The catheter is ready for use.      06/12/2017 Cancer Staging    Staging form: Vulva, AJCC 8th Edition - Clinical: Stage II (cT2, cN0, cM0) - Signed by Heath Lark, MD on 06/12/2017       REVIEW OF SYSTEMS:  Constitutional: Denies fevers, chills or abnormal weight loss Eyes: Denies blurriness of vision Ears, nose, mouth, throat, and face: Denies mucositis or sore throat Respiratory: Denies cough, dyspnea or wheezes Cardiovascular: Denies palpitation, chest discomfort or lower extremity swelling Gastrointestinal:  Denies nausea, heartburn or change in bowel habits Skin: Denies abnormal skin rashes Lymphatics: Denies new lymphadenopathy or easy bruising Neurological:Denies numbness, tingling or new weaknesses Behavioral/Psych: Mood is stable, no new changes  All other systems were reviewed with the patient and are negative.  I have reviewed the past medical  history, past surgical history, social history and family history with the patient and they are unchanged from previous note.  ALLERGIES:  is allergic to oxycodone; celebrex [celecoxib]; doxycycline; iron; lodine [etodolac]; nabumetone; tetracycline; and xarelto [rivaroxaban].  MEDICATIONS:  Current Outpatient Medications  Medication Sig Dispense Refill  . blood glucose meter kit and supplies KIT Dispense based on patient and insurance preference. Use up to three times daily as directed (FOR ICD-10 E11.9). 1 each 0  . collagenase (SANTYL) ointment Apply 1 application topically daily. Apply to vulva wound bed once daily with dressing changes 15 g 1  . dexamethasone (DECADRON) 4 MG tablet Take 3 tabs the night before and 3 tabs the morning of chemotherapy, every 3 weeks, with food 60 tablet 0  . ferrous sulfate 325 (65 FE) MG tablet Take 1 tablet (325 mg total) by mouth daily with breakfast. 30 tablet 1  . gabapentin (NEURONTIN) 300 MG capsule Take 1 capsule (300 mg total) by mouth 3 (three) times daily. (Patient taking differently: Take 300 mg by mouth 3 (three) times daily. ) 90 capsule 2  . HYDROcodone-acetaminophen (NORCO) 10-325 MG tablet Take 1 tablet by mouth every 4 (four) hours as needed. 60 tablet 0  . losartan (COZAAR) 50 MG tablet TAKE ONE TABLET BY MOUTH ONCE DAILY.--- takes in am 90 tablet 0  . metFORMIN (GLUCOPHAGE) 500 MG tablet TAKE (1) TABLET BY MOUTH TWICE DAILY. 180 tablet 0  . morphine (MS CONTIN) 15 MG 12 hr tablet Take 1 tablet (15 mg total) by mouth every 12 (twelve) hours. 60 tablet 0  . ondansetron (ZOFRAN) 8 MG tablet Take 1 tablet (8 mg total) by mouth 2 (two) times daily as needed for refractory nausea / vomiting. Start on day 3 after chemo. 30 tablet 1  . ondansetron (ZOFRAN) 8 MG tablet Take 1 tablet (8 mg total) by mouth every 8 (eight) hours as needed for nausea. 30 tablet 3  . oxyCODONE-acetaminophen (PERCOCET) 10-325 MG tablet Take 1 tablet by mouth every 4 (four)  hours as needed for pain. 30 tablet 0  . potassium chloride SA (K-DUR,KLOR-CON) 20 MEQ tablet TAKE 1 TABLET BY MOUTH ONCE A DAY. (Patient taking differently: TAKE 1 TABLET BY MOUTH ONCE A DAY.  --- takes in am) 90 tablet 0  . prochlorperazine (COMPAZINE) 10 MG tablet Take 1 tablet (10 mg total) by mouth every 6 (six) hours as needed (Nausea or vomiting). 30 tablet 1  . prochlorperazine (COMPAZINE) 10 MG tablet Take 1 tablet (10 mg total) by mouth every 6 (six) hours as needed for nausea or vomiting. 30 tablet 0  . sodium chloride flush 0.9 % SOLN injection Inject 10 mLs into the vein daily. 30 Syringe 9   No current facility-administered medications for this visit.    Facility-Administered Medications Ordered in Other Visits  Medication Dose Route Frequency Provider Last Rate Last Dose  . CARBOplatin (PARAPLATIN) 530 mg in sodium chloride 0.9 % 250 mL  chemo infusion  530 mg Intravenous Once Heath Lark, MD 606 mL/hr at 07/04/17 1252 530 mg at 07/04/17 1252  . heparin lock flush 100 unit/mL  500 Units Intracatheter Once PRN Alvy Bimler, Claribel Sachs, MD      . PACLitaxel (TAXOL) 252 mg in sodium chloride 0.9 % 250 mL chemo infusion (> '80mg'$ /m2)  131.25 mg/m2 (Treatment Plan Recorded) Intravenous Once Heath Lark, MD 97 mL/hr at 07/04/17 1146 252 mg at 07/04/17 1146  . sodium chloride flush (NS) 0.9 % injection 10 mL  10 mL Intracatheter PRN Alvy Bimler, Anvi Mangal, MD        PHYSICAL EXAMINATION: ECOG PERFORMANCE STATUS: 2 - Symptomatic, <50% confined to bed  Vitals:   07/04/17 0902  BP: 112/79  Pulse: (!) 114  Resp: 18  SpO2: 100%   Filed Weights   07/04/17 0902  Weight: 183 lb (83 kg)    GENERAL:alert, no distress and comfortable SKIN: skin color, texture, turgor are normal, no rashes or significant lesions EYES: normal, Conjunctiva are pink and non-injected, sclera clear OROPHARYNX:no exudate, no erythema and lips, buccal mucosa, and tongue normal  NECK: supple, thyroid normal size, non-tender, without  nodularity LYMPH:  no palpable lymphadenopathy in the cervical, axillary or inguinal LUNGS: clear to auscultation and percussion with normal breathing effort HEART: regular rate & rhythm and no murmurs and no lower extremity edema ABDOMEN:abdomen soft, non-tender and normal bowel sounds Musculoskeletal:no cyanosis of digits and no clubbing  NEURO: alert & oriented x 3 with fluent speech, no focal motor/sensory deficits  LABORATORY DATA:  I have reviewed the data as listed    Component Value Date/Time   NA 141 07/04/2017 0821   NA 144 09/30/2016 1505   K 4.9 07/04/2017 0821   K 4.4 09/30/2016 1505   CL 105 07/04/2017 0821   CO2 25 07/04/2017 0821   CO2 27 09/30/2016 1505   GLUCOSE 136 07/04/2017 0821   GLUCOSE 101 09/30/2016 1505   BUN 21 07/04/2017 0821   BUN 16.7 09/30/2016 1505   CREATININE 0.88 07/04/2017 0821   CREATININE 0.8 09/30/2016 1505   CALCIUM 9.8 07/04/2017 0821   CALCIUM 9.8 09/30/2016 1505   PROT 7.0 07/04/2017 0821   PROT 7.3 09/30/2016 1505   ALBUMIN 3.7 07/04/2017 0821   ALBUMIN 3.6 09/30/2016 1505   AST 12 07/04/2017 0821   AST 27 09/30/2016 1505   ALT 9 07/04/2017 0821   ALT 26 09/30/2016 1505   ALKPHOS 90 07/04/2017 0821   ALKPHOS 76 09/30/2016 1505   BILITOT 0.3 07/04/2017 0821   BILITOT 0.49 09/30/2016 1505   GFRNONAA >60 07/04/2017 0821   GFRAA >60 07/04/2017 0821    No results found for: SPEP, UPEP  Lab Results  Component Value Date   WBC 7.2 07/04/2017   NEUTROABS 6.7 (H) 07/04/2017   HGB 10.2 (L) 07/04/2017   HCT 31.8 (L) 07/04/2017   MCV 85.2 07/04/2017   PLT 211 07/04/2017      Chemistry      Component Value Date/Time   NA 141 07/04/2017 0821   NA 144 09/30/2016 1505   K 4.9 07/04/2017 0821   K 4.4 09/30/2016 1505   CL 105 07/04/2017 0821   CO2 25 07/04/2017 0821   CO2 27 09/30/2016 1505   BUN 21 07/04/2017 0821   BUN 16.7 09/30/2016 1505   CREATININE 0.88 07/04/2017 0821   CREATININE 0.8 09/30/2016 1505      Component  Value Date/Time   CALCIUM 9.8 07/04/2017 0821   CALCIUM 9.8  09/30/2016 1505   ALKPHOS 90 07/04/2017 0821   ALKPHOS 76 09/30/2016 1505   AST 12 07/04/2017 0821   AST 27 09/30/2016 1505   ALT 9 07/04/2017 0821   ALT 26 09/30/2016 1505   BILITOT 0.3 07/04/2017 0821   BILITOT 0.49 09/30/2016 1505       RADIOGRAPHIC STUDIES: I have personally reviewed the radiological images as listed and agreed with the findings in the report. Ir Picc Placement Right >5 Yrs Inc Img Guide  Result Date: 06/06/2017 INDICATION: Patient with history of vulvar carcinoma, iron deficiency anemia; central venous access requested for chemotherapy and iron infusions EXAM: RIGHT UPPER EXTREMITY PICC LINE PLACEMENT WITH ULTRASOUND AND FLUOROSCOPIC GUIDANCE MEDICATIONS: 1% lidocaine to skin and subcutaneous tissues ANESTHESIA/SEDATION: None FLUOROSCOPY TIME:  Fluoroscopy Time: 1 minute  (9 mGy). COMPLICATIONS: None immediate. PROCEDURE: The patient was advised of the possible risks and complications and agreed to undergo the procedure. The patient was then brought to the angiographic suite for the procedure. The right arm was prepped with chlorhexidine, draped in the usual sterile fashion using maximum barrier technique (cap and mask, sterile gown, sterile gloves, large sterile sheet, hand hygiene and cutaneous antisepsis) and infiltrated locally with 1% Lidocaine. Ultrasound demonstrated patency of the right basilic vein, and this was documented with an image. Under real-time ultrasound guidance, this vein was accessed with a 21 gauge micropuncture needle and image documentation was performed. A 0.018 wire was introduced in to the vein. Over this, a 5 [place] double lumen power injectable PICC was advanced to the lower SVC/right atrial junction. Fluoroscopy during the procedure and fluoro spot radiograph confirms appropriate catheter position. The catheter was flushed and covered with a sterile dressing. Catheter length: 42 cm  IMPRESSION: Successful right arm power PICC line placement with ultrasound and fluoroscopic guidance. The catheter is ready for use. Read by: Rowe Robert, PA-C Electronically Signed   By: Aletta Edouard M.D.   On: 06/06/2017 16:42    All questions were answered. The patient knows to call the clinic with any problems, questions or concerns. No barriers to learning was detected.  I spent 15 minutes counseling the patient face to face. The total time spent in the appointment was 20 minutes and more than 50% was on counseling and review of test results  Heath Lark, MD 07/04/2017 1:04 PM

## 2017-07-04 NOTE — Patient Instructions (Signed)
[facility] Discharge Instructions for Patients Receiving Chemotherapy  Today you received the following chemotherapy agents Taxol and Carboplatin  To help prevent nausea and vomiting after your treatment, we encourage you to take your nausea medication as directed   If you develop nausea and vomiting that is not controlled by your nausea medication, call the clinic.   BELOW ARE SYMPTOMS THAT SHOULD BE REPORTED IMMEDIATELY:  *FEVER GREATER THAN 100.5 F  *CHILLS WITH OR WITHOUT FEVER  NAUSEA AND VOMITING THAT IS NOT CONTROLLED WITH YOUR NAUSEA MEDICATION  *UNUSUAL SHORTNESS OF BREATH  *UNUSUAL BRUISING OR BLEEDING  TENDERNESS IN MOUTH AND THROAT WITH OR WITHOUT PRESENCE OF ULCERS  *URINARY PROBLEMS  *BOWEL PROBLEMS  UNUSUAL RASH Items with * indicate a potential emergency and should be followed up as soon as possible.  Feel free to call the clinic should you have any questions or concerns. The clinic phone number is [phone].  Please show the CHEMO ALERT CARD at check-in to the Emergency Department and triage nurse.   Paclitaxel (Taxol) injection What is this medicine? PACLITAXEL (PAK li TAX el) is a chemotherapy drug. It targets fast dividing cells, like cancer cells, and causes these cells to die. This medicine is used to treat ovarian cancer, breast cancer, and other cancers. This medicine may be used for other purposes; ask your health care provider or pharmacist if you have questions. COMMON BRAND NAME(S): Onxol, Taxol What should I tell my health care provider before I take this medicine? They need to know if you have any of these conditions: -blood disorders -irregular heartbeat -infection (especially a virus infection such as chickenpox, cold sores, or herpes) -liver disease -previous or ongoing radiation therapy -an unusual or allergic reaction to paclitaxel, alcohol, polyoxyethylated castor oil, other chemotherapy agents, other medicines,  foods, dyes, or preservatives -pregnant or trying to get pregnant -breast-feeding How should I use this medicine? This drug is given as an infusion into a vein. It is administered in a hospital or clinic by a specially trained health care professional. Talk to your pediatrician regarding the use of this medicine in children. Special care may be needed. Overdosage: If you think you have taken too much of this medicine contact a poison control center or emergency room at once. NOTE: This medicine is only for you. Do not share this medicine with others. What if I miss a dose? It is important not to miss your dose. Call your doctor or health care professional if you are unable to keep an appointment. What may interact with this medicine? Do not take this medicine with any of the following medications: -disulfiram -metronidazole This medicine may also interact with the following medications: -cyclosporine -diazepam -ketoconazole -medicines to increase blood counts like filgrastim, pegfilgrastim, sargramostim -other chemotherapy drugs like cisplatin, doxorubicin, epirubicin, etoposide, teniposide, vincristine -quinidine -testosterone -vaccines -verapamil Talk to your doctor or health care professional before taking any of these medicines: -acetaminophen -aspirin -ibuprofen -ketoprofen -naproxen This list may not describe all possible interactions. Give your health care provider a list of all the medicines, herbs, non-prescription drugs, or dietary supplements you use. Also tell them if you smoke, drink alcohol, or use illegal drugs. Some items may interact with your medicine. What should I watch for while using this medicine? Your condition will be monitored carefully while you are receiving this medicine. You will need important blood work done while you are taking this medicine. This medicine can cause serious allergic reactions. To reduce your risk you   will need to take other  medicine(s) before treatment with this medicine. If you experience allergic reactions like skin rash, itching or hives, swelling of the face, lips, or tongue, tell your doctor or health care professional right away. In some cases, you may be given additional medicines to help with side effects. Follow all directions for their use. This drug may make you feel generally unwell. This is not uncommon, as chemotherapy can affect healthy cells as well as cancer cells. Report any side effects. Continue your course of treatment even though you feel ill unless your doctor tells you to stop. Call your doctor or health care professional for advice if you get a fever, chills or sore throat, or other symptoms of a cold or flu. Do not treat yourself. This drug decreases your body's ability to fight infections. Try to avoid being around people who are sick. This medicine may increase your risk to bruise or bleed. Call your doctor or health care professional if you notice any unusual bleeding. Be careful brushing and flossing your teeth or using a toothpick because you may get an infection or bleed more easily. If you have any dental work done, tell your dentist you are receiving this medicine. Avoid taking products that contain aspirin, acetaminophen, ibuprofen, naproxen, or ketoprofen unless instructed by your doctor. These medicines may hide a fever. Do not become pregnant while taking this medicine. Women should inform their doctor if they wish to become pregnant or think they might be pregnant. There is a potential for serious side effects to an unborn child. Talk to your health care professional or pharmacist for more information. Do not breast-feed an infant while taking this medicine. Men are advised not to father a child while receiving this medicine. This product may contain alcohol. Ask your pharmacist or healthcare provider if this medicine contains alcohol. Be sure to tell all healthcare providers you are  taking this medicine. Certain medicines, like metronidazole and disulfiram, can cause an unpleasant reaction when taken with alcohol. The reaction includes flushing, headache, nausea, vomiting, sweating, and increased thirst. The reaction can last from 30 minutes to several hours. What side effects may I notice from receiving this medicine? Side effects that you should report to your doctor or health care professional as soon as possible: -allergic reactions like skin rash, itching or hives, swelling of the face, lips, or tongue -low blood counts - This drug may decrease the number of white blood cells, red blood cells and platelets. You may be at increased risk for infections and bleeding. -signs of infection - fever or chills, cough, sore throat, pain or difficulty passing urine -signs of decreased platelets or bleeding - bruising, pinpoint red spots on the skin, black, tarry stools, nosebleeds -signs of decreased red blood cells - unusually weak or tired, fainting spells, lightheadedness -breathing problems -chest pain -high or low blood pressure -mouth sores -nausea and vomiting -pain, swelling, redness or irritation at the injection site -pain, tingling, numbness in the hands or feet -slow or irregular heartbeat -swelling of the ankle, feet, hands Side effects that usually do not require medical attention (report to your doctor or health care professional if they continue or are bothersome): -bone pain -complete hair loss including hair on your head, underarms, pubic hair, eyebrows, and eyelashes -changes in the color of fingernails -diarrhea -loosening of the fingernails -loss of appetite -muscle or joint pain -red flush to skin -sweating This list may not describe all possible side effects. Call your doctor for   medical advice about side effects. You may report side effects to FDA at 1-800-FDA-1088. Where should I keep my medicine? This drug is given in a hospital or clinic and  will not be stored at home. NOTE: This sheet is a summary. It may not cover all possible information. If you have questions about this medicine, talk to your doctor, pharmacist, or health care provider.  2018 Elsevier/Gold Standard (2014-11-08 19:58:00)   Carboplatin injection What is this medicine? CARBOPLATIN (KAR boe pla tin) is a chemotherapy drug. It targets fast dividing cells, like cancer cells, and causes these cells to die. This medicine is used to treat ovarian cancer and many other cancers. This medicine may be used for other purposes; ask your health care provider or pharmacist if you have questions. COMMON BRAND NAME(S): Paraplatin What should I tell my health care provider before I take this medicine? They need to know if you have any of these conditions: -blood disorders -hearing problems -kidney disease -recent or ongoing radiation therapy -an unusual or allergic reaction to carboplatin, cisplatin, other chemotherapy, other medicines, foods, dyes, or preservatives -pregnant or trying to get pregnant -breast-feeding How should I use this medicine? This drug is usually given as an infusion into a vein. It is administered in a hospital or clinic by a specially trained health care professional. Talk to your pediatrician regarding the use of this medicine in children. Special care may be needed. Overdosage: If you think you have taken too much of this medicine contact a poison control center or emergency room at once. NOTE: This medicine is only for you. Do not share this medicine with others. What if I miss a dose? It is important not to miss a dose. Call your doctor or health care professional if you are unable to keep an appointment. What may interact with this medicine? -medicines for seizures -medicines to increase blood counts like filgrastim, pegfilgrastim, sargramostim -some antibiotics like amikacin, gentamicin, neomycin, streptomycin, tobramycin -vaccines Talk to  your doctor or health care professional before taking any of these medicines: -acetaminophen -aspirin -ibuprofen -ketoprofen -naproxen This list may not describe all possible interactions. Give your health care provider a list of all the medicines, herbs, non-prescription drugs, or dietary supplements you use. Also tell them if you smoke, drink alcohol, or use illegal drugs. Some items may interact with your medicine. What should I watch for while using this medicine? Your condition will be monitored carefully while you are receiving this medicine. You will need important blood work done while you are taking this medicine. This drug may make you feel generally unwell. This is not uncommon, as chemotherapy can affect healthy cells as well as cancer cells. Report any side effects. Continue your course of treatment even though you feel ill unless your doctor tells you to stop. In some cases, you may be given additional medicines to help with side effects. Follow all directions for their use. Call your doctor or health care professional for advice if you get a fever, chills or sore throat, or other symptoms of a cold or flu. Do not treat yourself. This drug decreases your body's ability to fight infections. Try to avoid being around people who are sick. This medicine may increase your risk to bruise or bleed. Call your doctor or health care professional if you notice any unusual bleeding. Be careful brushing and flossing your teeth or using a toothpick because you may get an infection or bleed more easily. If you have any dental work done,   tell your dentist you are receiving this medicine. Avoid taking products that contain aspirin, acetaminophen, ibuprofen, naproxen, or ketoprofen unless instructed by your doctor. These medicines may hide a fever. Do not become pregnant while taking this medicine. Women should inform their doctor if they wish to become pregnant or think they might be pregnant. There is a  potential for serious side effects to an unborn child. Talk to your health care professional or pharmacist for more information. Do not breast-feed an infant while taking this medicine. What side effects may I notice from receiving this medicine? Side effects that you should report to your doctor or health care professional as soon as possible: -allergic reactions like skin rash, itching or hives, swelling of the face, lips, or tongue -signs of infection - fever or chills, cough, sore throat, pain or difficulty passing urine -signs of decreased platelets or bleeding - bruising, pinpoint red spots on the skin, black, tarry stools, nosebleeds -signs of decreased red blood cells - unusually weak or tired, fainting spells, lightheadedness -breathing problems -changes in hearing -changes in vision -chest pain -high blood pressure -low blood counts - This drug may decrease the number of white blood cells, red blood cells and platelets. You may be at increased risk for infections and bleeding. -nausea and vomiting -pain, swelling, redness or irritation at the injection site -pain, tingling, numbness in the hands or feet -problems with balance, talking, walking -trouble passing urine or change in the amount of urine Side effects that usually do not require medical attention (report to your doctor or health care professional if they continue or are bothersome): -hair loss -loss of appetite -metallic taste in the mouth or changes in taste This list may not describe all possible side effects. Call your doctor for medical advice about side effects. You may report side effects to FDA at 1-800-FDA-1088. Where should I keep my medicine? This drug is given in a hospital or clinic and will not be stored at home. NOTE: This sheet is a summary. It may not cover all possible information. If you have questions about this medicine, talk to your doctor, pharmacist, or health care provider.  2018 Elsevier/Gold  Standard (2007-04-14 14:38:05)   

## 2017-07-04 NOTE — Telephone Encounter (Signed)
Gave patient avs and calendar of upcoming October appointments °

## 2017-07-04 NOTE — Assessment & Plan Note (Signed)
She denies worsening peripheral neuropathy from treatment She will continue dietary modification and metformin

## 2017-07-04 NOTE — Assessment & Plan Note (Signed)
She has stable pain control on narcotic prescription We will continue similar medications for now

## 2017-07-07 ENCOUNTER — Inpatient Hospital Stay: Payer: PPO

## 2017-07-07 ENCOUNTER — Telehealth: Payer: Self-pay | Admitting: *Deleted

## 2017-07-07 VITALS — BP 119/67 | HR 100 | Temp 97.7°F | Resp 20

## 2017-07-07 DIAGNOSIS — Z483 Aftercare following surgery for neoplasm: Secondary | ICD-10-CM | POA: Diagnosis not present

## 2017-07-07 DIAGNOSIS — C519 Malignant neoplasm of vulva, unspecified: Secondary | ICD-10-CM

## 2017-07-07 DIAGNOSIS — R269 Unspecified abnormalities of gait and mobility: Secondary | ICD-10-CM | POA: Diagnosis not present

## 2017-07-07 DIAGNOSIS — Z5111 Encounter for antineoplastic chemotherapy: Secondary | ICD-10-CM | POA: Diagnosis not present

## 2017-07-07 DIAGNOSIS — N764 Abscess of vulva: Secondary | ICD-10-CM | POA: Diagnosis not present

## 2017-07-07 MED ORDER — PEGFILGRASTIM-CBQV 6 MG/0.6ML ~~LOC~~ SOSY
PREFILLED_SYRINGE | SUBCUTANEOUS | Status: AC
  Filled 2017-07-07: qty 0.6

## 2017-07-07 MED ORDER — PEGFILGRASTIM-CBQV 6 MG/0.6ML ~~LOC~~ SOSY
6.0000 mg | PREFILLED_SYRINGE | Freq: Once | SUBCUTANEOUS | Status: AC
  Administered 2017-07-07: 6 mg via SUBCUTANEOUS

## 2017-07-07 NOTE — Patient Instructions (Signed)
Pegfilgrastim injection What is this medicine? PEGFILGRASTIM (PEG fil gra stim) is a long-acting granulocyte colony-stimulating factor that stimulates the growth of neutrophils, a type of white blood cell important in the body's fight against infection. It is used to reduce the incidence of fever and infection in patients with certain types of cancer who are receiving chemotherapy that affects the bone marrow, and to increase survival after being exposed to high doses of radiation. This medicine may be used for other purposes; ask your health care provider or pharmacist if you have questions. COMMON BRAND NAME(S): Neulasta What should I tell my health care provider before I take this medicine? They need to know if you have any of these conditions: -kidney disease -latex allergy -ongoing radiation therapy -sickle cell disease -skin reactions to acrylic adhesives (On-Body Injector only) -an unusual or allergic reaction to pegfilgrastim, filgrastim, other medicines, foods, dyes, or preservatives -pregnant or trying to get pregnant -breast-feeding How should I use this medicine? This medicine is for injection under the skin. If you get this medicine at home, you will be taught how to prepare and give the pre-filled syringe or how to use the On-body Injector. Refer to the patient Instructions for Use for detailed instructions. Use exactly as directed. Tell your healthcare provider immediately if you suspect that the On-body Injector may not have performed as intended or if you suspect the use of the On-body Injector resulted in a missed or partial dose. It is important that you put your used needles and syringes in a special sharps container. Do not put them in a trash can. If you do not have a sharps container, call your pharmacist or healthcare provider to get one. Talk to your pediatrician regarding the use of this medicine in children. While this drug may be prescribed for selected conditions,  precautions do apply. Overdosage: If you think you have taken too much of this medicine contact a poison control center or emergency room at once. NOTE: This medicine is only for you. Do not share this medicine with others. What if I miss a dose? It is important not to miss your dose. Call your doctor or health care professional if you miss your dose. If you miss a dose due to an On-body Injector failure or leakage, a new dose should be administered as soon as possible using a single prefilled syringe for manual use. What may interact with this medicine? Interactions have not been studied. Give your health care provider a list of all the medicines, herbs, non-prescription drugs, or dietary supplements you use. Also tell them if you smoke, drink alcohol, or use illegal drugs. Some items may interact with your medicine. This list may not describe all possible interactions. Give your health care provider a list of all the medicines, herbs, non-prescription drugs, or dietary supplements you use. Also tell them if you smoke, drink alcohol, or use illegal drugs. Some items may interact with your medicine. What should I watch for while using this medicine? You may need blood work done while you are taking this medicine. If you are going to need a MRI, CT scan, or other procedure, tell your doctor that you are using this medicine (On-Body Injector only). What side effects may I notice from receiving this medicine? Side effects that you should report to your doctor or health care professional as soon as possible: -allergic reactions like skin rash, itching or hives, swelling of the face, lips, or tongue -dizziness -fever -pain, redness, or irritation at site   where injected -pinpoint red spots on the skin -red or dark-brown urine -shortness of breath or breathing problems -stomach or side pain, or pain at the shoulder -swelling -tiredness -trouble passing urine or change in the amount of urine Side  effects that usually do not require medical attention (report to your doctor or health care professional if they continue or are bothersome): -bone pain -muscle pain This list may not describe all possible side effects. Call your doctor for medical advice about side effects. You may report side effects to FDA at 1-800-FDA-1088. Where should I keep my medicine? Keep out of the reach of children. Store pre-filled syringes in a refrigerator between 2 and 8 degrees C (36 and 46 degrees F). Do not freeze. Keep in carton to protect from light. Throw away this medicine if it is left out of the refrigerator for more than 48 hours. Throw away any unused medicine after the expiration date. NOTE: This sheet is a summary. It may not cover all possible information. If you have questions about this medicine, talk to your doctor, pharmacist, or health care provider.  2018 Elsevier/Gold Standard (2016-01-04 12:58:03)  

## 2017-07-07 NOTE — Telephone Encounter (Signed)
-----   Message from Heath Lark, MD sent at 07/07/2017  7:46 AM EDT ----- Regarding: iron studies pls call and let her know iron studies are good No need further iV iron

## 2017-07-09 ENCOUNTER — Telehealth: Payer: Self-pay | Admitting: Oncology

## 2017-07-09 NOTE — Telephone Encounter (Signed)
Called [name] back and she said she is feeling much better.  She said she took the second morphine tablet after talking to me and it has helped.  Her pain is now down to a 4/10.

## 2017-07-09 NOTE — Telephone Encounter (Signed)
[name] called and said she is still having a lot of pain in her vaginal area.  She is taking Morphine 15 mg q 12 hours and hydrocodone/acetaminophen 10/325 mg about every 6 hours in between.  She said the morphine helps for awhile but the pain returns when she starts moving around.  She also mentioned that she had a large episode of incontinence of urine the morning after chemotherapyl.  She denies having any hematuria or fever.  She does have some stress incontinence normally and denies feeling like she has a urinary tract infection.  Joylene John, NP advised of message above.  She recommended that patient try 2 tablets of morphine 15 mg q 12 hours and to try to back off of the hydrocodone.  Called [name] back and advised her of the recommendation and that if she starts to feel sleepy from the morphine to go back to 1 tablet.  She verbalized understanding and agreement.  Also advised her that the episode of urinary incontinence may have been from receiving fluids with chemo.  Asked that she let us know if she would like to have her urine checked for a UTI.  Also advised that I will call her this afternoon to see how she is feeling.

## 2017-07-14 ENCOUNTER — Telehealth: Payer: Self-pay | Admitting: Oncology

## 2017-07-14 MED ORDER — SODIUM CHLORIDE FLUSH 0.9 % IV SOLN: 10.0000 mL | Freq: Every day | INTRAVENOUS | 9 refills | Status: AC

## 2017-07-14 NOTE — Telephone Encounter (Signed)
Honestee called and said she is out of saline flushes for her PICC line.  She is wondering if we can call in a prescription for saline flushes to [facility] in [place] so that it can be picked up today.  Discussed with Sharlynn Oliphant, RN, Dr. Calton Dach nurse.  Refill placed for [employer].

## 2017-07-15 ENCOUNTER — Telehealth: Payer: Self-pay | Admitting: Oncology

## 2017-07-15 NOTE — Telephone Encounter (Signed)
Larene Beach from [facility] called asking for a verbal order to continue dressing changes and PICC line dressing changes weekly for 5 more weeks.  She also asked about applying 1/4 strength Dakin's solution to the wounds.  Called her back at [phone] and left her a message with the verbal order and notified her that we will ask a doctor about the Dakin's solution and call back.

## 2017-07-18 DIAGNOSIS — L609 Nail disorder, unspecified: Secondary | ICD-10-CM | POA: Diagnosis not present

## 2017-07-18 DIAGNOSIS — C519 Malignant neoplasm of vulva, unspecified: Secondary | ICD-10-CM | POA: Diagnosis not present

## 2017-07-18 DIAGNOSIS — L851 Acquired keratosis [keratoderma] palmaris et plantaris: Secondary | ICD-10-CM | POA: Diagnosis not present

## 2017-07-18 DIAGNOSIS — Z483 Aftercare following surgery for neoplasm: Secondary | ICD-10-CM | POA: Diagnosis not present

## 2017-07-18 DIAGNOSIS — N764 Abscess of vulva: Secondary | ICD-10-CM | POA: Diagnosis not present

## 2017-07-18 DIAGNOSIS — R269 Unspecified abnormalities of gait and mobility: Secondary | ICD-10-CM | POA: Diagnosis not present

## 2017-07-18 DIAGNOSIS — E1342 Other specified diabetes mellitus with diabetic polyneuropathy: Secondary | ICD-10-CM | POA: Diagnosis not present

## 2017-07-21 ENCOUNTER — Other Ambulatory Visit: Payer: Self-pay | Admitting: Gynecologic Oncology

## 2017-07-21 ENCOUNTER — Telehealth: Payer: Self-pay | Admitting: Oncology

## 2017-07-21 DIAGNOSIS — G893 Neoplasm related pain (acute) (chronic): Secondary | ICD-10-CM

## 2017-07-21 DIAGNOSIS — R7303 Prediabetes: Secondary | ICD-10-CM

## 2017-07-21 MED ORDER — MORPHINE SULFATE ER 15 MG PO TBCR: 15.0000 mg | EXTENDED_RELEASE_TABLET | Freq: Two times a day (BID) | ORAL | 0 refills | Status: DC

## 2017-07-21 MED ORDER — MORPHINE SULFATE ER 30 MG PO TBCR: 30.0000 mg | EXTENDED_RELEASE_TABLET | Freq: Two times a day (BID) | ORAL | 0 refills | Status: DC

## 2017-07-21 NOTE — Telephone Encounter (Signed)
Left a message for [name] advising her that the refill has been sent to [facility].

## 2017-07-21 NOTE — Telephone Encounter (Signed)
[name] left a message saying that she is doing well and needs a refill of Morphine.  She asked that it be sent to [facility].

## 2017-07-21 NOTE — Telephone Encounter (Signed)
[facility] and let them know the morphine prescription is OK to fill early as the patient was told to take 2 tablets instead of one for increased pain.

## 2017-07-22 ENCOUNTER — Telehealth: Payer: Self-pay | Admitting: Oncology

## 2017-07-22 NOTE — Telephone Encounter (Signed)
I will certainly check on her wound Please ask her to take pictures if possible

## 2017-07-22 NOTE — Telephone Encounter (Signed)
Called patient and notified her of message below.

## 2017-07-22 NOTE — Telephone Encounter (Signed)
[name] called and asked if Dr. Alvy Bimler can take a look at her wound on Friday.  She said it is burning when anything touches it.  She is alternating packing the wound and leaving it open to air.  She said the pain is manageable and she does not think the wound is infected.  She continues to take Morphine as directed. Offered to have her see Dr. Denman George tomorrow but she said she does not have transportation and does not think it is urgent.

## 2017-07-23 NOTE — Telephone Encounter (Signed)
Called Shannon back and advised her that it is OK to use the Dakin's solution per Joylene John, NP.

## 2017-07-25 ENCOUNTER — Inpatient Hospital Stay: Payer: PPO

## 2017-07-25 ENCOUNTER — Inpatient Hospital Stay (HOSPITAL_BASED_OUTPATIENT_CLINIC_OR_DEPARTMENT_OTHER): Payer: PPO | Admitting: Hematology and Oncology

## 2017-07-25 ENCOUNTER — Inpatient Hospital Stay: Payer: PPO | Attending: Gynecologic Oncology

## 2017-07-25 ENCOUNTER — Encounter: Payer: Self-pay | Admitting: Hematology and Oncology

## 2017-07-25 VITALS — BP 125/85 | HR 94 | Temp 97.8°F | Resp 18 | Ht 64.0 in | Wt 182.0 lb

## 2017-07-25 DIAGNOSIS — C519 Malignant neoplasm of vulva, unspecified: Secondary | ICD-10-CM

## 2017-07-25 DIAGNOSIS — D61810 Antineoplastic chemotherapy induced pancytopenia: Secondary | ICD-10-CM | POA: Diagnosis not present

## 2017-07-25 DIAGNOSIS — G893 Neoplasm related pain (acute) (chronic): Secondary | ICD-10-CM | POA: Insufficient documentation

## 2017-07-25 DIAGNOSIS — E1140 Type 2 diabetes mellitus with diabetic neuropathy, unspecified: Secondary | ICD-10-CM | POA: Insufficient documentation

## 2017-07-25 DIAGNOSIS — D509 Iron deficiency anemia, unspecified: Secondary | ICD-10-CM | POA: Diagnosis not present

## 2017-07-25 DIAGNOSIS — Z5111 Encounter for antineoplastic chemotherapy: Secondary | ICD-10-CM | POA: Insufficient documentation

## 2017-07-25 DIAGNOSIS — Z5189 Encounter for other specified aftercare: Secondary | ICD-10-CM | POA: Insufficient documentation

## 2017-07-25 DIAGNOSIS — Z452 Encounter for adjustment and management of vascular access device: Secondary | ICD-10-CM | POA: Diagnosis not present

## 2017-07-25 DIAGNOSIS — N9089 Other specified noninflammatory disorders of vulva and perineum: Secondary | ICD-10-CM

## 2017-07-25 LAB — CBC WITH DIFFERENTIAL/PLATELET
Basophils Absolute: 0 10*3/uL (ref 0.0–0.1)
Basophils Relative: 0 %
EOS PCT: 0 %
Eosinophils Absolute: 0 10*3/uL (ref 0.0–0.5)
HEMATOCRIT: 27 % — AB (ref 34.8–46.6)
HEMOGLOBIN: 9 g/dL — AB (ref 11.6–15.9)
LYMPHS ABS: 0.4 10*3/uL — AB (ref 0.9–3.3)
Lymphocytes Relative: 6 %
MCH: 28.4 pg (ref 25.1–34.0)
MCHC: 33.2 g/dL (ref 31.5–36.0)
MCV: 85.6 fL (ref 79.5–101.0)
MONO ABS: 0.3 10*3/uL (ref 0.1–0.9)
MONOS PCT: 5 %
NEUTROS PCT: 89 %
Neutro Abs: 5.6 10*3/uL (ref 1.5–6.5)
Platelets: 183 10*3/uL (ref 145–400)
RBC: 3.15 MIL/uL — ABNORMAL LOW (ref 3.70–5.45)
RDW: 21 % — AB (ref 11.2–14.5)
WBC: 6.3 10*3/uL (ref 3.9–10.3)

## 2017-07-25 LAB — COMPREHENSIVE METABOLIC PANEL
ALK PHOS: 68 U/L (ref 38–126)
ALT: 9 U/L (ref 0–44)
AST: 16 U/L (ref 15–41)
Albumin: 3.3 g/dL — ABNORMAL LOW (ref 3.5–5.0)
Anion gap: 12 (ref 5–15)
BILIRUBIN TOTAL: 0.6 mg/dL (ref 0.3–1.2)
BUN: 19 mg/dL (ref 8–23)
CALCIUM: 9.6 mg/dL (ref 8.9–10.3)
CHLORIDE: 102 mmol/L (ref 98–111)
CO2: 26 mmol/L (ref 22–32)
CREATININE: 0.9 mg/dL (ref 0.44–1.00)
GFR, EST NON AFRICAN AMERICAN: 60 mL/min — AB (ref >60)
Glucose, Bld: 136 mg/dL — ABNORMAL HIGH (ref 70–99)
Potassium: 5.1 mmol/L (ref 3.5–5.1)
Sodium: 140 mmol/L (ref 135–145)
TOTAL PROTEIN: 6.7 g/dL (ref 6.5–8.1)

## 2017-07-25 LAB — SAMPLE TO BLOOD BANK

## 2017-07-25 MED ORDER — SODIUM CHLORIDE 0.9 % IV SOLN
Freq: Once | INTRAVENOUS | Status: AC
  Administered 2017-07-25: 10:00:00 via INTRAVENOUS

## 2017-07-25 MED ORDER — SODIUM CHLORIDE 0.9 % IV SOLN
510.0000 mg | Freq: Once | INTRAVENOUS | Status: AC
  Administered 2017-07-25: 510 mg via INTRAVENOUS
  Filled 2017-07-25: qty 17

## 2017-07-25 MED ORDER — PALONOSETRON HCL INJECTION 0.25 MG/5ML
0.2500 mg | Freq: Once | INTRAVENOUS | Status: AC
  Administered 2017-07-25: 0.25 mg via INTRAVENOUS

## 2017-07-25 MED ORDER — FAMOTIDINE IN NACL 20-0.9 MG/50ML-% IV SOLN
INTRAVENOUS | Status: AC
  Filled 2017-07-25: qty 50

## 2017-07-25 MED ORDER — DIPHENHYDRAMINE HCL 50 MG/ML IJ SOLN
50.0000 mg | Freq: Once | INTRAMUSCULAR | Status: AC
  Administered 2017-07-25: 50 mg via INTRAVENOUS

## 2017-07-25 MED ORDER — PALONOSETRON HCL INJECTION 0.25 MG/5ML
INTRAVENOUS | Status: AC
  Filled 2017-07-25: qty 5

## 2017-07-25 MED ORDER — HEPARIN SOD (PORK) LOCK FLUSH 100 UNIT/ML IV SOLN
500.0000 [IU] | Freq: Once | INTRAVENOUS | Status: AC | PRN
  Administered 2017-07-25: 500 [IU]
  Filled 2017-07-25: qty 5

## 2017-07-25 MED ORDER — SODIUM CHLORIDE 0.9% FLUSH
10.0000 mL | INTRAVENOUS | Status: DC | PRN
  Administered 2017-07-25: 10 mL
  Filled 2017-07-25: qty 10

## 2017-07-25 MED ORDER — DIPHENHYDRAMINE HCL 50 MG/ML IJ SOLN
INTRAMUSCULAR | Status: AC
  Filled 2017-07-25: qty 1

## 2017-07-25 MED ORDER — SODIUM CHLORIDE 0.9 % IV SOLN
530.0000 mg | Freq: Once | INTRAVENOUS | Status: AC
  Administered 2017-07-25: 530 mg via INTRAVENOUS
  Filled 2017-07-25: qty 53

## 2017-07-25 MED ORDER — FOSAPREPITANT DIMEGLUMINE INJECTION 150 MG
Freq: Once | INTRAVENOUS | Status: AC
  Administered 2017-07-25: 11:00:00 via INTRAVENOUS
  Filled 2017-07-25: qty 5

## 2017-07-25 MED ORDER — FAMOTIDINE IN NACL 20-0.9 MG/50ML-% IV SOLN
20.0000 mg | Freq: Once | INTRAVENOUS | Status: AC
  Administered 2017-07-25: 20 mg via INTRAVENOUS

## 2017-07-25 MED ORDER — SODIUM CHLORIDE 0.9 % IV SOLN
131.2500 mg/m2 | Freq: Once | INTRAVENOUS | Status: AC
  Administered 2017-07-25: 252 mg via INTRAVENOUS
  Filled 2017-07-25: qty 42

## 2017-07-25 MED ORDER — HEPARIN SOD (PORK) LOCK FLUSH 100 UNIT/ML IV SOLN
250.0000 [IU] | Freq: Once | INTRAVENOUS | Status: AC
  Administered 2017-07-25: 250 [IU]
  Filled 2017-07-25: qty 5

## 2017-07-25 MED ORDER — SODIUM CHLORIDE 0.9% FLUSH
10.0000 mL | Freq: Once | INTRAVENOUS | Status: AC
  Administered 2017-07-25: 10 mL
  Filled 2017-07-25: qty 10

## 2017-07-25 NOTE — Assessment & Plan Note (Signed)
I have reviewed multiple pictures of her vulvar lesion Overall, she has healthy granulation tissue with healthy rim of vasculature around the lesion indicated of regeneration I reassured the patient For now, I recommend normal saline dressing only without other agents to avoid caustic effect on her wound

## 2017-07-25 NOTE — Assessment & Plan Note (Signed)
She has multifactorial anemia including iron deficiency anemia I recommend iron infusion 1 more time

## 2017-07-25 NOTE — Assessment & Plan Note (Signed)
She has recent uncontrolled pain  Her prescription pain medicine was increased to MS Contin 30 mg twice a day We have extensive discussion about chronic pain management

## 2017-07-25 NOTE — Patient Instructions (Signed)
[facility] Discharge Instructions for Patients Receiving Chemotherapy  Today you received the following chemotherapy agents Taxol and Carboplatin  To help prevent nausea and vomiting after your treatment, we encourage you to take your nausea medication as directed   If you develop nausea and vomiting that is not controlled by your nausea medication, call the clinic.   BELOW ARE SYMPTOMS THAT SHOULD BE REPORTED IMMEDIATELY:  *FEVER GREATER THAN 100.5 F  *CHILLS WITH OR WITHOUT FEVER  NAUSEA AND VOMITING THAT IS NOT CONTROLLED WITH YOUR NAUSEA MEDICATION  *UNUSUAL SHORTNESS OF BREATH  *UNUSUAL BRUISING OR BLEEDING  TENDERNESS IN MOUTH AND THROAT WITH OR WITHOUT PRESENCE OF ULCERS  *URINARY PROBLEMS  *BOWEL PROBLEMS  UNUSUAL RASH Items with * indicate a potential emergency and should be followed up as soon as possible.  Feel free to call the clinic should you have any questions or concerns. The clinic phone number is [phone].  Please show the CHEMO ALERT CARD at check-in to the Emergency Department and triage nurse.   Paclitaxel (Taxol) injection What is this medicine? PACLITAXEL (PAK li TAX el) is a chemotherapy drug. It targets fast dividing cells, like cancer cells, and causes these cells to die. This medicine is used to treat ovarian cancer, breast cancer, and other cancers. This medicine may be used for other purposes; ask your health care provider or pharmacist if you have questions. COMMON BRAND NAME(S): Onxol, Taxol What should I tell my health care provider before I take this medicine? They need to know if you have any of these conditions: -blood disorders -irregular heartbeat -infection (especially a virus infection such as chickenpox, cold sores, or herpes) -liver disease -previous or ongoing radiation therapy -an unusual or allergic reaction to paclitaxel, alcohol, polyoxyethylated castor oil, other chemotherapy agents, other medicines,  foods, dyes, or preservatives -pregnant or trying to get pregnant -breast-feeding How should I use this medicine? This drug is given as an infusion into a vein. It is administered in a hospital or clinic by a specially trained health care professional. Talk to your pediatrician regarding the use of this medicine in children. Special care may be needed. Overdosage: If you think you have taken too much of this medicine contact a poison control center or emergency room at once. NOTE: This medicine is only for you. Do not share this medicine with others. What if I miss a dose? It is important not to miss your dose. Call your doctor or health care professional if you are unable to keep an appointment. What may interact with this medicine? Do not take this medicine with any of the following medications: -disulfiram -metronidazole This medicine may also interact with the following medications: -cyclosporine -diazepam -ketoconazole -medicines to increase blood counts like filgrastim, pegfilgrastim, sargramostim -other chemotherapy drugs like cisplatin, doxorubicin, epirubicin, etoposide, teniposide, vincristine -quinidine -testosterone -vaccines -verapamil Talk to your doctor or health care professional before taking any of these medicines: -acetaminophen -aspirin -ibuprofen -ketoprofen -naproxen This list may not describe all possible interactions. Give your health care provider a list of all the medicines, herbs, non-prescription drugs, or dietary supplements you use. Also tell them if you smoke, drink alcohol, or use illegal drugs. Some items may interact with your medicine. What should I watch for while using this medicine? Your condition will be monitored carefully while you are receiving this medicine. You will need important blood work done while you are taking this medicine. This medicine can cause serious allergic reactions. To reduce your risk you   will need to take other  medicine(s) before treatment with this medicine. If you experience allergic reactions like skin rash, itching or hives, swelling of the face, lips, or tongue, tell your doctor or health care professional right away. In some cases, you may be given additional medicines to help with side effects. Follow all directions for their use. This drug may make you feel generally unwell. This is not uncommon, as chemotherapy can affect healthy cells as well as cancer cells. Report any side effects. Continue your course of treatment even though you feel ill unless your doctor tells you to stop. Call your doctor or health care professional for advice if you get a fever, chills or sore throat, or other symptoms of a cold or flu. Do not treat yourself. This drug decreases your body's ability to fight infections. Try to avoid being around people who are sick. This medicine may increase your risk to bruise or bleed. Call your doctor or health care professional if you notice any unusual bleeding. Be careful brushing and flossing your teeth or using a toothpick because you may get an infection or bleed more easily. If you have any dental work done, tell your dentist you are receiving this medicine. Avoid taking products that contain aspirin, acetaminophen, ibuprofen, naproxen, or ketoprofen unless instructed by your doctor. These medicines may hide a fever. Do not become pregnant while taking this medicine. Women should inform their doctor if they wish to become pregnant or think they might be pregnant. There is a potential for serious side effects to an unborn child. Talk to your health care professional or pharmacist for more information. Do not breast-feed an infant while taking this medicine. Men are advised not to father a child while receiving this medicine. This product may contain alcohol. Ask your pharmacist or healthcare provider if this medicine contains alcohol. Be sure to tell all healthcare providers you are  taking this medicine. Certain medicines, like metronidazole and disulfiram, can cause an unpleasant reaction when taken with alcohol. The reaction includes flushing, headache, nausea, vomiting, sweating, and increased thirst. The reaction can last from 30 minutes to several hours. What side effects may I notice from receiving this medicine? Side effects that you should report to your doctor or health care professional as soon as possible: -allergic reactions like skin rash, itching or hives, swelling of the face, lips, or tongue -low blood counts - This drug may decrease the number of white blood cells, red blood cells and platelets. You may be at increased risk for infections and bleeding. -signs of infection - fever or chills, cough, sore throat, pain or difficulty passing urine -signs of decreased platelets or bleeding - bruising, pinpoint red spots on the skin, black, tarry stools, nosebleeds -signs of decreased red blood cells - unusually weak or tired, fainting spells, lightheadedness -breathing problems -chest pain -high or low blood pressure -mouth sores -nausea and vomiting -pain, swelling, redness or irritation at the injection site -pain, tingling, numbness in the hands or feet -slow or irregular heartbeat -swelling of the ankle, feet, hands Side effects that usually do not require medical attention (report to your doctor or health care professional if they continue or are bothersome): -bone pain -complete hair loss including hair on your head, underarms, pubic hair, eyebrows, and eyelashes -changes in the color of fingernails -diarrhea -loosening of the fingernails -loss of appetite -muscle or joint pain -red flush to skin -sweating This list may not describe all possible side effects. Call your doctor for   medical advice about side effects. You may report side effects to FDA at 1-800-FDA-1088. Where should I keep my medicine? This drug is given in a hospital or clinic and  will not be stored at home. NOTE: This sheet is a summary. It may not cover all possible information. If you have questions about this medicine, talk to your doctor, pharmacist, or health care provider.  2018 [employer] Standard (2014-11-08 19:58:00)   Carboplatin injection What is this medicine? CARBOPLATIN (KAR boe pla tin) is a chemotherapy drug. It targets fast dividing cells, like cancer cells, and causes these cells to die. This medicine is used to treat ovarian cancer and many other cancers. This medicine may be used for other purposes; ask your health care provider or pharmacist if you have questions. COMMON BRAND NAME(S): Paraplatin What should I tell my health care provider before I take this medicine? They need to know if you have any of these conditions: -blood disorders -hearing problems -kidney disease -recent or ongoing radiation therapy -an unusual or allergic reaction to carboplatin, cisplatin, other chemotherapy, other medicines, foods, dyes, or preservatives -pregnant or trying to get pregnant -breast-feeding How should I use this medicine? This drug is usually given as an infusion into a vein. It is administered in a hospital or clinic by a specially trained health care professional. Talk to your pediatrician regarding the use of this medicine in children. Special care may be needed. Overdosage: If you think you have taken too much of this medicine contact a poison control center or emergency room at once. NOTE: This medicine is only for you. Do not share this medicine with others. What if I miss a dose? It is important not to miss a dose. Call your doctor or health care professional if you are unable to keep an appointment. What may interact with this medicine? -medicines for seizures -medicines to increase blood counts like filgrastim, pegfilgrastim, sargramostim -some antibiotics like amikacin, gentamicin, neomycin, streptomycin, tobramycin -vaccines Talk to  your doctor or health care professional before taking any of these medicines: -acetaminophen -aspirin -ibuprofen -ketoprofen -naproxen This list may not describe all possible interactions. Give your health care provider a list of all the medicines, herbs, non-prescription drugs, or dietary supplements you use. Also tell them if you smoke, drink alcohol, or use illegal drugs. Some items may interact with your medicine. What should I watch for while using this medicine? Your condition will be monitored carefully while you are receiving this medicine. You will need important blood work done while you are taking this medicine. This drug may make you feel generally unwell. This is not uncommon, as chemotherapy can affect healthy cells as well as cancer cells. Report any side effects. Continue your course of treatment even though you feel ill unless your doctor tells you to stop. In some cases, you may be given additional medicines to help with side effects. Follow all directions for their use. Call your doctor or health care professional for advice if you get a fever, chills or sore throat, or other symptoms of a cold or flu. Do not treat yourself. This drug decreases your body's ability to fight infections. Try to avoid being around people who are sick. This medicine may increase your risk to bruise or bleed. Call your doctor or health care professional if you notice any unusual bleeding. Be careful brushing and flossing your teeth or using a toothpick because you may get an infection or bleed more easily. If you have any dental work done,   tell your dentist you are receiving this medicine. Avoid taking products that contain aspirin, acetaminophen, ibuprofen, naproxen, or ketoprofen unless instructed by your doctor. These medicines may hide a fever. Do not become pregnant while taking this medicine. Women should inform their doctor if they wish to become pregnant or think they might be pregnant. There is a  potential for serious side effects to an unborn child. Talk to your health care professional or pharmacist for more information. Do not breast-feed an infant while taking this medicine. What side effects may I notice from receiving this medicine? Side effects that you should report to your doctor or health care professional as soon as possible: -allergic reactions like skin rash, itching or hives, swelling of the face, lips, or tongue -signs of infection - fever or chills, cough, sore throat, pain or difficulty passing urine -signs of decreased platelets or bleeding - bruising, pinpoint red spots on the skin, black, tarry stools, nosebleeds -signs of decreased red blood cells - unusually weak or tired, fainting spells, lightheadedness -breathing problems -changes in hearing -changes in vision -chest pain -high blood pressure -low blood counts - This drug may decrease the number of white blood cells, red blood cells and platelets. You may be at increased risk for infections and bleeding. -nausea and vomiting -pain, swelling, redness or irritation at the injection site -pain, tingling, numbness in the hands or feet -problems with balance, talking, walking -trouble passing urine or change in the amount of urine Side effects that usually do not require medical attention (report to your doctor or health care professional if they continue or are bothersome): -hair loss -loss of appetite -metallic taste in the mouth or changes in taste This list may not describe all possible side effects. Call your doctor for medical advice about side effects. You may report side effects to FDA at 1-800-FDA-1088. Where should I keep my medicine? This drug is given in a hospital or clinic and will not be stored at home. NOTE: This sheet is a summary. It may not cover all possible information. If you have questions about this medicine, talk to your doctor, pharmacist, or health care provider.  2018 [employer]/Gold  Standard (2007-04-14 14:38:05)   

## 2017-07-25 NOTE — Progress Notes (Signed)
[place] OFFICE PROGRESS NOTE  Patient Care Team: Mikey Kirschner, MD as PCP - General (Family Medicine)  ASSESSMENT & PLAN:  Vulva cancer [facility]) We have a very long discussion today Clinically, her vulval cancer appears to be healing She tolerated treatment well except for anemia I recommend we proceed with treatment without dose adjustment I plan to repeat PET CT scan before her next cycle of treatment for objective response to therapy She will continue aggressive wound care through home health care agency  Iron deficiency anemia She has multifactorial anemia including iron deficiency anemia I recommend iron infusion 1 more time  Diabetes mellitus with diabetic neuropathy, without long-term current use of insulin ([place]) Her diabetes is well controlled with metformin and dietary modification She will continue the same  Cancer associated pain She has recent uncontrolled pain  Her prescription pain medicine was increased to MS Contin 30 mg twice a day We have extensive discussion about chronic pain management  Labial lesion I have reviewed multiple pictures of her vulvar lesion Overall, she has healthy granulation tissue with healthy rim of vasculature around the lesion indicated of regeneration I reassured the patient For now, I recommend normal saline dressing only without other agents to avoid caustic effect on her wound   Orders Placed This Encounter  Procedures  . NM PET Image Restag (PS) Skull Base To Thigh    Standing Status:   Future    Standing Expiration Date:   07/26/2018    Order Specific Question:   If indicated for the ordered procedure, I authorize the administration of a radiopharmaceutical per Radiology protocol    Answer:   Yes    Order Specific Question:   Preferred imaging location?    Answer:   [facility]    Order Specific Question:   Radiology Contrast Protocol - do NOT remove file path    Answer:    _[email or web address]    INTERVAL HISTORY: Please see below for problem oriented charting. She returns with her son for cycle 3 of chemotherapy The patient appears very anxious today She has numerous pictures taken of the vulval lesion and had expressed significant concern due to recent worsening pain The areas of her pain is predominantly at the open sore on the right labial/pubic mons.  At the lower part of her vagina, there are signs of skin maceration likely due to sweat causing irritation. Her recent morphine dose was increased to 30 mg twice a day along with Percocet as needed for breakthrough pain She denies nausea or vomiting from recent treatment Denies constipation Denies peripheral neuropathy from treatment No recent fever or chills The patient denies any recent signs or symptoms of bleeding such as spontaneous epistaxis, hematuria or hematochezia.  SUMMARY OF ONCOLOGIC HISTORY:   Vulva cancer ([place])   02/22/2016 Pathology Results    Labium, biopsy, right mid - INVASIVE SQUAMOUS CELL CARCINOMA.      02/23/2016 Imaging    Ct abdomen and pelvis Mild asymmetric soft tissue thickening in the right vulvar/perineal region, likely corresponding to the patient's known vulvar cancer, poorly evaluated on CT.  No evidence of metastatic disease.  Status post hysterectomy.      02/27/2016 Pathology Results    1. Lymph nodes, regional resection, right inguinal - METASTATIC CARCINOMA IN 1 OF 6 LYMPH NODES (1/6). 2. Vulva, excision, right - INVASIVE KERATINIZING SQUAMOUS CELL CARCINOMA, MODERATELY DIFFERENTIATED, SPANNING 2.1 CM WITH A DEPTH OF INVASION OF 0.6 CM. - THE SURGICAL RESECTION MARGIN  ARE NEGATIVE FOR CARCINOMA. - DIFFUSE LICHEN SCLEROSUS ET ATROPHICUS. - SEE ONCOLOGY TABLE BELOW. Microscopic Comment 2. VULVA Specimen: Right vulvectomy. Procedure: Resection Lymph node sampling performed: Yes. Tumor site: Right vulva Tumor focality: Carcinoma is  unifocal. Maximum tumor size (cm): 2.1 cm (gross measurement). Histologic type: Keratinizing squmaous cell carcinoma. Grade: Moderately differentiated. Depth of stromal invasion (mm): 6 mm (glass slide measurement) Margins: Negative for carcinoma. Distance of carcinoma from nearest margin: 0.6 cm to the 3 o'clock margin. Lymph - Vascular invasion: Not identified. Lymph nodes: number examined: 6 Number positive: 1 size of metastatic focus: 0.3 cm (glass slide measurement) extracapsular extension: Not identified. TNM code: pT1b, pN1a FIGO Stage (based on pathologic findings, needs clinical correlation): IIIA Comment: Grossly, there is a 2.1 cm sessile papular lesion which on histologic evaluation reveals invasive keratinizing squamous cell carcinoma invading to a depth of 0.6 cm as measured from a glass slide. In addition, there is diffuse lichen sclerosus et atrophicus. The surgical resection margins are negative for invasive caricnoma. Additional studies can be performed upon clinical request.      02/27/2016 Surgery    Surgery: Right radical partial vulvectomy, right inguinal lymph node dissection  Surgeons:  Imagene Gurney A. Alycia Rossetti, MD; Lahoma Crocker, MD   Pathology: Right vulva with resection margin with suture at 12:00. Right inguinal nodes.   Operative findings: 1.5 cm fungating erosive lesion of the right vulva approximately 2 cm from the urethra. Evidence of lichen sclerosis throughout the entire vulvar. No other ulcerative lesion suspicious for vulvar carcinoma. Shotty palpable lymphadenopathy in the right inguinal space.        04/01/2016 Imaging    Ct chest 1. Multiple pulmonary nodules as detailed above, generally in the 4 mm and less average diameter range although with 1 right lower lobe subpleural nodule measuring about 8 mm in diameter. These likely warrant surveillance. 2. Hypodense right thyroid nodule, 1.1 cm in long axis. Consider further evaluation with thyroid  ultrasound. If patient is clinically hyperthyroid, consider nuclear medicine thyroid uptake and scan. 3. Stable hypodense lateral splenic lesion, likely a cyst. 4. Aortic and coronary atherosclerosis.       04/02/2016 Pathology Results    Labium, biopsy, left minora INVASIVE SQUAMOUS CELL CARCINOMA      04/09/2016 Pathology Results    1. Vulva, vulvectomy, left - INVASIVE SQUAMOUS CELL CARCINOMA, NON-KERATINIZING, SPANNING 2.4 CM IN WIDTH X 0.4 CM IN DEPTH. - CARCINOMA IS BROADLY PRESENT AT THE 6 O'CLOCK MARGIN OF SPECIMEN #1. - SEE ONCOLOGY TABLE BELOW. 2. Vulva, excision, lateral margin - SQUAMOUS LINED EPITHELIUM WITH LICHEN SCLEROSUS ET ATROPHICUS. 3. Vulva, excision, medial margin - SQUAMOUS LINED EPITHELIUM WITH LICHEN SCLEROSUS ET ATROPHICUS. Microscopic Comment 1. VULVA Specimen: Left vulva. Procedure: Left vulvectomy with additional lateral and medial margins. Lymph node sampling performed: Not on current case. Tumor site: Left vulva. Tumor focality: Unifocal. Maximum tumor size (cm): 2.4 cm in width (gross measurement) Histologic type: Squamous cell carcinoma, non-keratinizing. Grade: High grade. Depth of stromal invasion (mm): 0.4 cm (4 mm), glass slide measurement. Margins: Invasive carcinoma broadly present at the 6 o'clock margin of specimen #1. Lymph - Vascular invasion: Not identified. Lymph nodes: None examined. TNM code: pT1b, pNX FIGO Stage (based on pathologic findings, needs clinical correlation): IB      04/09/2016 Surgery    Surgery: Partial radical left vulvectomy  Surgeons:  Donaciano Eva, MD  Pathology: 1/ left vulva with marking stitch at 12 o'clock anterior, 2/ lateral (thigh) margin with stitch at  true new lateral margin. 3/ medial (vaginal) margin with marking stitch at true new medial margin. Operative findings: 2cm left mid labial nodular lesion        05/03/2016 Imaging    Ct chest 1.  No evidence of acute pulmonary  embolus. 2. Calcified coronary artery atherosclerosis. Negative visible aorta. 3. Pulmonary atelectasis with no other acute pulmonary process. The small subpleural pulmonary nodules seen by CT last month are stable.       06/05/2016 - 07/25/2016 Radiation Therapy    Radiation treatment dates:   06/05/16 - 07/25/16  Site/dose:   Pelvis treated to 45 Gy in 25 fractions, then vulvar region Boosted an additional 18 Gy in 9 fractions.  Beams/energy:   Pelvis:  IMRT  //  6X                              Boost:  IMRT  //  6X       09/30/2016 Pathology Results    Vulva, biopsy, anterior vulva INVASIVE SQUAMOUS CELL CARCINOMA      10/07/2016 Imaging    Ct chest 1. Stable bilateral pulmonary nodules, likely benign but will require surveillance. Recommend followup noncontrast chest CT in 6-12 months. 2. No mediastinal or hilar mass or adenopathy. 3. Stable coronary artery calcifications.  Aortic Atherosclerosis (ICD10-I70.0).      10/17/2016 Pathology Results    Vulva, vulvectomy, anterior - INVASIVE SQUAMOUS CELL CARCINOMA, SPANNING 2.7 CM AND EXTENDING TO A DEPTH OF 0.3 CM. - RESECTION MARGINS ARE NEGATIVE. - SEE ONCOLOGY TABLE. Microscopic Comment VULVA Specimen: Anterior vulva. Procedure: Partial anterior vulvectomy. Lymph node sampling performed: None. Tumor site: Anterior vulva. Tumor focality: Unifocal. Maximum tumor size (cm): 2.7 cm. Histologic type: Squamous cell carcinoma, non-keratinizing. Grade: G2, moderately differentiated. Depth of stromal invasion (mm): 3 mm. Margins: Negative Distance of carcinoma from nearest margin: Invasive tumor is 1 mm from urethral margin. Lymph - Vascular invasion: Not identified. Lymph nodes: None. TNM code: pT1b, pNX FIGO Stage (based on pathologic findings, needs clinical correlation): IB      10/17/2016 Surgery    Surgery: Partial radical anterior vulvectomy  Surgeons:  Donaciano Eva, MD  Pathology: anterior vulva with  marking stitch at anterior clitoral margin o'clock  Operative findings: 3cm raised vulvar cancer (leukoplakia) at midline and right vulva extending to within 0.5cm of anterior urethral meatus        02/17/2017 Pathology Results    Vulva, biopsy - PARAKERATOSIS AND ACUTE INFLAMMATION. - PAS IS NEGATIVE FOR FUNGAL ORGANISMS. - NO DYSPLASIA OR MALIGNANCY.      05/01/2017 Imaging    Ct abdomen and pelvis New 4 x 3 cm rim enhancing lesion central air bubbles in the right vulvar region, which could represent locally recurrent tumor with necrosis or abscess.  No evidence of metastatic disease within the abdomen or pelvis.      05/15/2017 Pathology Results    1. Labium, biopsy, right posterior labia majora - PARAKERATOSIS. - NO DYSPLASIA OR MALIGNANCY. 2. Vulva, excision, anterior right mons pubis lesion - INVASIVE MODERATE TO POORLY DIFFERENTIATED SQUAMOUS CELL CARCINOMA. - ASSOCIATED ULCER, NECROSIS AND ACUTE INFLAMMATION. - SEE COMMENT. 3. Labium, biopsy, left mid labia majora - POORLY DIFFERENTIATED SQUAMOUS CELL CARCINOMA. - SEE COMMENT. Microscopic Comment 2. The specimen is disrupted hampering assessment of margin orientation and size, however, carcinoma involves almost the entire specimen (thus size estimated to be at least 3.1 cm with depth of 1  cm) and extends to the majority of the presumed lateral and deep margins. There is perineural invasion. 3. The carcinoma is largely beneath the epidermis and extends to the deep and lateral edges of the biopsy.      05/15/2017 Surgery    Surgery: debridement of anterior vulvar abscess, vulvar biopsies.   Surgeons:  Donaciano Eva, MD  Pathology: right posterior labia majora, anterior mons pubis, left mid labia majora  Operative findings: 6cm cavitating anterior mons pubis indurated lesion. It is approximately 3.5cm from the urethral meatus to inferior aspect of mass. Two indurated 1-2cm nodular areas (one on the posterior  right labia majora and one on the mid left labia majora). No periurethral lesions.        06/02/2017 PET scan    Stable hypermetabolic soft tissue density in right vulva, which may be due to postoperative change from recent excision, although residual malignancy and infection cannot be excluded.  No evidence of local or distant metastatic disease.      06/06/2017 Procedure    Successful right arm power PICC line placement with ultrasound and fluoroscopic guidance. The catheter is ready for use.      06/12/2017 Cancer Staging    Staging form: Vulva, AJCC 8th Edition - Clinical: Stage II (cT2, cN0, cM0) - Signed by Heath Lark, MD on 06/12/2017       REVIEW OF SYSTEMS:   Constitutional: Denies fevers, chills or abnormal weight loss Eyes: Denies blurriness of vision Ears, nose, mouth, throat, and face: Denies mucositis or sore throat Respiratory: Denies cough, dyspnea or wheezes Cardiovascular: Denies palpitation, chest discomfort or lower extremity swelling Gastrointestinal:  Denies nausea, heartburn or change in bowel habits Lymphatics: Denies new lymphadenopathy or easy bruising Neurological:Denies numbness, tingling or new weaknesses Behavioral/Psych: Mood is stable, no new changes  All other systems were reviewed with the patient and are negative.  I have reviewed the past medical history, past surgical history, social history and family history with the patient and they are unchanged from previous note.  ALLERGIES:  is allergic to oxycodone; celebrex [celecoxib]; doxycycline; iron; lodine [etodolac]; nabumetone; tetracycline; and xarelto [rivaroxaban].  MEDICATIONS:  Current Outpatient Medications  Medication Sig Dispense Refill  . blood glucose meter kit and supplies KIT Dispense based on patient and insurance preference. Use up to three times daily as directed (FOR ICD-10 E11.9). 1 each 0  . collagenase (SANTYL) ointment Apply 1 application topically daily. Apply to vulva  wound bed once daily with dressing changes 15 g 1  . dexamethasone (DECADRON) 4 MG tablet Take 3 tabs the night before and 3 tabs the morning of chemotherapy, every 3 weeks, with food 60 tablet 0  . ferrous sulfate 325 (65 FE) MG tablet Take 1 tablet (325 mg total) by mouth daily with breakfast. 30 tablet 1  . gabapentin (NEURONTIN) 300 MG capsule Take 1 capsule (300 mg total) by mouth 3 (three) times daily. (Patient taking differently: Take 300 mg by mouth 3 (three) times daily. ) 90 capsule 2  . HYDROcodone-acetaminophen (NORCO) 10-325 MG tablet Take 1 tablet by mouth every 4 (four) hours as needed. 60 tablet 0  . losartan (COZAAR) 50 MG tablet TAKE ONE TABLET BY MOUTH ONCE DAILY.--- takes in am 90 tablet 0  . metFORMIN (GLUCOPHAGE) 500 MG tablet TAKE (1) TABLET BY MOUTH TWICE DAILY. 180 tablet 0  . morphine (MS CONTIN) 30 MG 12 hr tablet Take 1 tablet (30 mg total) by mouth every 12 (twelve) hours. 60 tablet 0  .  ondansetron (ZOFRAN) 8 MG tablet Take 1 tablet (8 mg total) by mouth 2 (two) times daily as needed for refractory nausea / vomiting. Start on day 3 after chemo. 30 tablet 1  . ondansetron (ZOFRAN) 8 MG tablet Take 1 tablet (8 mg total) by mouth every 8 (eight) hours as needed for nausea. 30 tablet 3  . oxyCODONE-acetaminophen (PERCOCET) 10-325 MG tablet Take 1 tablet by mouth every 4 (four) hours as needed for pain. 30 tablet 0  . potassium chloride SA (K-DUR,KLOR-CON) 20 MEQ tablet TAKE 1 TABLET BY MOUTH ONCE A DAY. (Patient taking differently: TAKE 1 TABLET BY MOUTH ONCE A DAY.  --- takes in am) 90 tablet 0  . prochlorperazine (COMPAZINE) 10 MG tablet Take 1 tablet (10 mg total) by mouth every 6 (six) hours as needed (Nausea or vomiting). 30 tablet 1  . prochlorperazine (COMPAZINE) 10 MG tablet Take 1 tablet (10 mg total) by mouth every 6 (six) hours as needed for nausea or vomiting. 30 tablet 0  . sodium chloride flush 0.9 % SOLN injection Inject 10 mLs into the vein daily. 30 Syringe 9    No current facility-administered medications for this visit.    Facility-Administered Medications Ordered in Other Visits  Medication Dose Route Frequency Provider Last Rate Last Dose  . CARBOplatin (PARAPLATIN) 530 mg in sodium chloride 0.9 % 250 mL chemo infusion  530 mg Intravenous Once Alvy Bimler, Rada Zegers, MD      . ferumoxytol (FERAHEME) 510 mg in sodium chloride 0.9 % 100 mL IVPB  510 mg Intravenous Once Alvy Bimler, Gricel Copen, MD      . heparin lock flush 100 unit/mL  500 Units Intracatheter Once PRN Alvy Bimler, Read Bonelli, MD      . PACLitaxel (TAXOL) 252 mg in sodium chloride 0.9 % 250 mL chemo infusion (> 110m/m2)  131.25 mg/m2 (Treatment Plan Recorded) Intravenous Once GAlvy Bimler Emerie Vanderkolk, MD 97 mL/hr at 07/25/17 1147 252 mg at 07/25/17 1147  . sodium chloride flush (NS) 0.9 % injection 10 mL  10 mL Intracatheter PRN GAlvy Bimler Jahsir Rama, MD        PHYSICAL EXAMINATION: ECOG PERFORMANCE STATUS: 2 - Symptomatic, <50% confined to bed  Vitals:   07/25/17 0855  BP: 125/85  Pulse: 94  Resp: 18  Temp: 97.8 F (36.6 C)  SpO2: 100%   Filed Weights   07/25/17 0855  Weight: 182 lb (82.6 kg)    GENERAL:alert, no distress and comfortable.  She appears very debilitated SKIN: skin color, texture, turgor are normal, no rashes or significant lesions EYES: normal, Conjunctiva are pink and non-injected, sclera clear OROPHARYNX:no exudate, no erythema and lips, buccal mucosa, and tongue normal  NECK: supple, thyroid normal size, non-tender, without nodularity LYMPH:  no palpable lymphadenopathy in the cervical, axillary or inguinal LUNGS: clear to auscultation and percussion with normal breathing effort HEART: regular rate & rhythm and no murmurs and no lower extremity edema ABDOMEN:abdomen soft, non-tender and normal bowel sounds Musculoskeletal:no cyanosis of digits and no clubbing  NEURO: alert & oriented x 3 with fluent speech, no focal motor/sensory deficits  LABORATORY DATA:  I have reviewed the data as listed     Component Value Date/Time   NA 140 07/25/2017 0801   NA 144 09/30/2016 1505   K 5.1 07/25/2017 0801   K 4.4 09/30/2016 1505   CL 102 07/25/2017 0801   CO2 26 07/25/2017 0801   CO2 27 09/30/2016 1505   GLUCOSE 136 (H) 07/25/2017 0801   GLUCOSE 101 09/30/2016 1505   BUN  19 07/25/2017 0801   BUN 16.7 09/30/2016 1505   CREATININE 0.90 07/25/2017 0801   CREATININE 0.88 07/04/2017 0821   CREATININE 0.8 09/30/2016 1505   CALCIUM 9.6 07/25/2017 0801   CALCIUM 9.8 09/30/2016 1505   PROT 6.7 07/25/2017 0801   PROT 7.3 09/30/2016 1505   ALBUMIN 3.3 (L) 07/25/2017 0801   ALBUMIN 3.6 09/30/2016 1505   AST 16 07/25/2017 0801   AST 12 07/04/2017 0821   AST 27 09/30/2016 1505   ALT 9 07/25/2017 0801   ALT 9 07/04/2017 0821   ALT 26 09/30/2016 1505   ALKPHOS 68 07/25/2017 0801   ALKPHOS 76 09/30/2016 1505   BILITOT 0.6 07/25/2017 0801   BILITOT 0.3 07/04/2017 0821   BILITOT 0.49 09/30/2016 1505   GFRNONAA 60 (L) 07/25/2017 0801   GFRNONAA >60 07/04/2017 0821   GFRAA >60 07/25/2017 0801   GFRAA >60 07/04/2017 0821    No results found for: SPEP, UPEP  Lab Results  Component Value Date   WBC 6.3 07/25/2017   NEUTROABS 5.6 07/25/2017   HGB 9.0 (L) 07/25/2017   HCT 27.0 (L) 07/25/2017   MCV 85.6 07/25/2017   PLT 183 07/25/2017      Chemistry      Component Value Date/Time   NA 140 07/25/2017 0801   NA 144 09/30/2016 1505   K 5.1 07/25/2017 0801   K 4.4 09/30/2016 1505   CL 102 07/25/2017 0801   CO2 26 07/25/2017 0801   CO2 27 09/30/2016 1505   BUN 19 07/25/2017 0801   BUN 16.7 09/30/2016 1505   CREATININE 0.90 07/25/2017 0801   CREATININE 0.88 07/04/2017 0821   CREATININE 0.8 09/30/2016 1505      Component Value Date/Time   CALCIUM 9.6 07/25/2017 0801   CALCIUM 9.8 09/30/2016 1505   ALKPHOS 68 07/25/2017 0801   ALKPHOS 76 09/30/2016 1505   AST 16 07/25/2017 0801   AST 12 07/04/2017 0821   AST 27 09/30/2016 1505   ALT 9 07/25/2017 0801   ALT 9 07/04/2017 0821    ALT 26 09/30/2016 1505   BILITOT 0.6 07/25/2017 0801   BILITOT 0.3 07/04/2017 0821   BILITOT 0.49 09/30/2016 1505       All questions were answered. The patient knows to call the clinic with any problems, questions or concerns. No barriers to learning was detected.  I spent 30 minutes counseling the patient face to face. The total time spent in the appointment was 40 minutes and more than 50% was on counseling and review of test results  Heath Lark, MD 07/25/2017 12:59 PM

## 2017-07-25 NOTE — Assessment & Plan Note (Signed)
Her diabetes is well controlled with metformin and dietary modification She will continue the same

## 2017-07-25 NOTE — Assessment & Plan Note (Signed)
We have a very long discussion today Clinically, her vulval cancer appears to be healing She tolerated treatment well except for anemia I recommend we proceed with treatment without dose adjustment I plan to repeat PET CT scan before her next cycle of treatment for objective response to therapy She will continue aggressive wound care through home health care agency

## 2017-07-28 ENCOUNTER — Telehealth: Payer: Self-pay | Admitting: Oncology

## 2017-07-28 ENCOUNTER — Inpatient Hospital Stay: Payer: PPO

## 2017-07-28 VITALS — BP 128/82 | HR 92 | Temp 98.2°F | Resp 17

## 2017-07-28 DIAGNOSIS — C519 Malignant neoplasm of vulva, unspecified: Secondary | ICD-10-CM

## 2017-07-28 DIAGNOSIS — Z5111 Encounter for antineoplastic chemotherapy: Secondary | ICD-10-CM | POA: Diagnosis not present

## 2017-07-28 MED ORDER — PEGFILGRASTIM-CBQV 6 MG/0.6ML ~~LOC~~ SOSY
PREFILLED_SYRINGE | SUBCUTANEOUS | Status: AC
  Filled 2017-07-28: qty 0.6

## 2017-07-28 MED ORDER — PEGFILGRASTIM-CBQV 6 MG/0.6ML ~~LOC~~ SOSY
6.0000 mg | PREFILLED_SYRINGE | Freq: Once | SUBCUTANEOUS | Status: AC
  Administered 2017-07-28: 6 mg via SUBCUTANEOUS

## 2017-07-28 NOTE — Telephone Encounter (Signed)
Left a message for [name] at [facility] regarding [name]'s dressing supplies.  Per her son, [name], they are concerned about the cost of the dressings.  Requested a return call.

## 2017-07-28 NOTE — Patient Instructions (Signed)
Pegfilgrastim injection What is this medicine? PEGFILGRASTIM (PEG fil gra stim) is a long-acting granulocyte colony-stimulating factor that stimulates the growth of neutrophils, a type of white blood cell important in the body's fight against infection. It is used to reduce the incidence of fever and infection in patients with certain types of cancer who are receiving chemotherapy that affects the bone marrow, and to increase survival after being exposed to high doses of radiation. This medicine may be used for other purposes; ask your health care provider or pharmacist if you have questions. COMMON BRAND NAME(S): Neulasta What should I tell my health care provider before I take this medicine? They need to know if you have any of these conditions: -kidney disease -latex allergy -ongoing radiation therapy -sickle cell disease -skin reactions to acrylic adhesives (On-Body Injector only) -an unusual or allergic reaction to pegfilgrastim, filgrastim, other medicines, foods, dyes, or preservatives -pregnant or trying to get pregnant -breast-feeding How should I use this medicine? This medicine is for injection under the skin. If you get this medicine at home, you will be taught how to prepare and give the pre-filled syringe or how to use the On-body Injector. Refer to the patient Instructions for Use for detailed instructions. Use exactly as directed. Tell your healthcare provider immediately if you suspect that the On-body Injector may not have performed as intended or if you suspect the use of the On-body Injector resulted in a missed or partial dose. It is important that you put your used needles and syringes in a special sharps container. Do not put them in a trash can. If you do not have a sharps container, call your pharmacist or healthcare provider to get one. Talk to your pediatrician regarding the use of this medicine in children. While this drug may be prescribed for selected conditions,  precautions do apply. Overdosage: If you think you have taken too much of this medicine contact a poison control center or emergency room at once. NOTE: This medicine is only for you. Do not share this medicine with others. What if I miss a dose? It is important not to miss your dose. Call your doctor or health care professional if you miss your dose. If you miss a dose due to an On-body Injector failure or leakage, a new dose should be administered as soon as possible using a single prefilled syringe for manual use. What may interact with this medicine? Interactions have not been studied. Give your health care provider a list of all the medicines, herbs, non-prescription drugs, or dietary supplements you use. Also tell them if you smoke, drink alcohol, or use illegal drugs. Some items may interact with your medicine. This list may not describe all possible interactions. Give your health care provider a list of all the medicines, herbs, non-prescription drugs, or dietary supplements you use. Also tell them if you smoke, drink alcohol, or use illegal drugs. Some items may interact with your medicine. What should I watch for while using this medicine? You may need blood work done while you are taking this medicine. If you are going to need a MRI, CT scan, or other procedure, tell your doctor that you are using this medicine (On-Body Injector only). What side effects may I notice from receiving this medicine? Side effects that you should report to your doctor or health care professional as soon as possible: -allergic reactions like skin rash, itching or hives, swelling of the face, lips, or tongue -dizziness -fever -pain, redness, or irritation at site   where injected -pinpoint red spots on the skin -red or dark-brown urine -shortness of breath or breathing problems -stomach or side pain, or pain at the shoulder -swelling -tiredness -trouble passing urine or change in the amount of urine Side  effects that usually do not require medical attention (report to your doctor or health care professional if they continue or are bothersome): -bone pain -muscle pain This list may not describe all possible side effects. Call your doctor for medical advice about side effects. You may report side effects to FDA at 1-800-FDA-1088. Where should I keep my medicine? Keep out of the reach of children. Store pre-filled syringes in a refrigerator between 2 and 8 degrees C (36 and 46 degrees F). Do not freeze. Keep in carton to protect from light. Throw away this medicine if it is left out of the refrigerator for more than 48 hours. Throw away any unused medicine after the expiration date. NOTE: This sheet is a summary. It may not cover all possible information. If you have questions about this medicine, talk to your doctor, pharmacist, or health care provider.  2018 Elsevier/Gold Standard (2016-01-04 12:58:03)  

## 2017-07-30 ENCOUNTER — Ambulatory Visit: Payer: PPO | Admitting: Gynecologic Oncology

## 2017-07-30 NOTE — Telephone Encounter (Signed)
Talked with Larene Beach at [facility].  She is now going to do normal saline wet to dry dressings.

## 2017-08-02 ENCOUNTER — Inpatient Hospital Stay (HOSPITAL_COMMUNITY)
Admission: EM | Admit: 2017-08-02 | Discharge: 2017-08-10 | DRG: 754 | Disposition: A | Discharge status: Home Health | Payer: PPO | Attending: Internal Medicine | Admitting: Internal Medicine

## 2017-08-02 ENCOUNTER — Encounter (HOSPITAL_COMMUNITY): Payer: Self-pay | Admitting: Nurse Practitioner

## 2017-08-02 DIAGNOSIS — L98492 Non-pressure chronic ulcer of skin of other sites with fat layer exposed: Secondary | ICD-10-CM

## 2017-08-02 DIAGNOSIS — I10 Essential (primary) hypertension: Secondary | ICD-10-CM | POA: Diagnosis present

## 2017-08-02 DIAGNOSIS — T451X5A Adverse effect of antineoplastic and immunosuppressive drugs, initial encounter: Secondary | ICD-10-CM | POA: Diagnosis not present

## 2017-08-02 DIAGNOSIS — D61818 Other pancytopenia: Secondary | ICD-10-CM | POA: Diagnosis present

## 2017-08-02 DIAGNOSIS — Z6830 Body mass index (BMI) 30.0-30.9, adult: Secondary | ICD-10-CM | POA: Diagnosis not present

## 2017-08-02 DIAGNOSIS — C519 Malignant neoplasm of vulva, unspecified: Principal | ICD-10-CM | POA: Diagnosis present

## 2017-08-02 DIAGNOSIS — D6481 Anemia due to antineoplastic chemotherapy: Secondary | ICD-10-CM | POA: Diagnosis present

## 2017-08-02 DIAGNOSIS — I350 Nonrheumatic aortic (valve) stenosis: Secondary | ICD-10-CM | POA: Diagnosis present

## 2017-08-02 DIAGNOSIS — I959 Hypotension, unspecified: Secondary | ICD-10-CM | POA: Diagnosis present

## 2017-08-02 DIAGNOSIS — Z885 Allergy status to narcotic agent status: Secondary | ICD-10-CM

## 2017-08-02 DIAGNOSIS — R102 Pelvic and perineal pain: Secondary | ICD-10-CM | POA: Diagnosis not present

## 2017-08-02 DIAGNOSIS — Z515 Encounter for palliative care: Secondary | ICD-10-CM | POA: Diagnosis not present

## 2017-08-02 DIAGNOSIS — E1140 Type 2 diabetes mellitus with diabetic neuropathy, unspecified: Secondary | ICD-10-CM | POA: Diagnosis not present

## 2017-08-02 DIAGNOSIS — L089 Local infection of the skin and subcutaneous tissue, unspecified: Secondary | ICD-10-CM | POA: Diagnosis not present

## 2017-08-02 DIAGNOSIS — Z7982 Long term (current) use of aspirin: Secondary | ICD-10-CM | POA: Diagnosis not present

## 2017-08-02 DIAGNOSIS — Z961 Presence of intraocular lens: Secondary | ICD-10-CM | POA: Diagnosis present

## 2017-08-02 DIAGNOSIS — D72819 Decreased white blood cell count, unspecified: Secondary | ICD-10-CM | POA: Diagnosis not present

## 2017-08-02 DIAGNOSIS — Z7984 Long term (current) use of oral hypoglycemic drugs: Secondary | ICD-10-CM | POA: Diagnosis not present

## 2017-08-02 DIAGNOSIS — Z79899 Other long term (current) drug therapy: Secondary | ICD-10-CM | POA: Diagnosis not present

## 2017-08-02 DIAGNOSIS — L0889 Other specified local infections of the skin and subcutaneous tissue: Secondary | ICD-10-CM | POA: Diagnosis not present

## 2017-08-02 DIAGNOSIS — D509 Iron deficiency anemia, unspecified: Secondary | ICD-10-CM | POA: Diagnosis present

## 2017-08-02 DIAGNOSIS — E876 Hypokalemia: Secondary | ICD-10-CM | POA: Diagnosis present

## 2017-08-02 DIAGNOSIS — Z886 Allergy status to analgesic agent status: Secondary | ICD-10-CM

## 2017-08-02 DIAGNOSIS — Z7189 Other specified counseling: Secondary | ICD-10-CM | POA: Diagnosis not present

## 2017-08-02 DIAGNOSIS — Z888 Allergy status to other drugs, medicaments and biological substances status: Secondary | ICD-10-CM

## 2017-08-02 DIAGNOSIS — E878 Other disorders of electrolyte and fluid balance, not elsewhere classified: Secondary | ICD-10-CM | POA: Diagnosis not present

## 2017-08-02 DIAGNOSIS — Z923 Personal history of irradiation: Secondary | ICD-10-CM

## 2017-08-02 DIAGNOSIS — Z7952 Long term (current) use of systemic steroids: Secondary | ICD-10-CM | POA: Diagnosis not present

## 2017-08-02 DIAGNOSIS — Z6831 Body mass index (BMI) 31.0-31.9, adult: Secondary | ICD-10-CM

## 2017-08-02 DIAGNOSIS — D696 Thrombocytopenia, unspecified: Secondary | ICD-10-CM | POA: Diagnosis not present

## 2017-08-02 DIAGNOSIS — D61810 Antineoplastic chemotherapy induced pancytopenia: Secondary | ICD-10-CM | POA: Diagnosis present

## 2017-08-02 DIAGNOSIS — Z96643 Presence of artificial hip joint, bilateral: Secondary | ICD-10-CM | POA: Diagnosis present

## 2017-08-02 DIAGNOSIS — Z9842 Cataract extraction status, left eye: Secondary | ICD-10-CM

## 2017-08-02 DIAGNOSIS — G893 Neoplasm related pain (acute) (chronic): Secondary | ICD-10-CM | POA: Diagnosis not present

## 2017-08-02 DIAGNOSIS — Z90710 Acquired absence of both cervix and uterus: Secondary | ICD-10-CM

## 2017-08-02 DIAGNOSIS — E440 Moderate protein-calorie malnutrition: Secondary | ICD-10-CM | POA: Diagnosis present

## 2017-08-02 DIAGNOSIS — N766 Ulceration of vulva: Secondary | ICD-10-CM | POA: Diagnosis present

## 2017-08-02 DIAGNOSIS — E43 Unspecified severe protein-calorie malnutrition: Secondary | ICD-10-CM | POA: Diagnosis present

## 2017-08-02 DIAGNOSIS — Z9841 Cataract extraction status, right eye: Secondary | ICD-10-CM | POA: Diagnosis not present

## 2017-08-02 DIAGNOSIS — K922 Gastrointestinal hemorrhage, unspecified: Secondary | ICD-10-CM | POA: Diagnosis present

## 2017-08-02 DIAGNOSIS — Z881 Allergy status to other antibiotic agents status: Secondary | ICD-10-CM

## 2017-08-02 NOTE — ED Triage Notes (Signed)
Pt presents with c/o severe vaginal valvular pain, she is undergoing cancer treatment for valvular ca, states the pain was uncontrollable with home pain medication. Also c/o hot flashes.

## 2017-08-03 ENCOUNTER — Emergency Department (HOSPITAL_COMMUNITY): Payer: PPO

## 2017-08-03 ENCOUNTER — Encounter (HOSPITAL_COMMUNITY): Payer: Self-pay | Admitting: Radiology

## 2017-08-03 DIAGNOSIS — D61818 Other pancytopenia: Secondary | ICD-10-CM | POA: Diagnosis present

## 2017-08-03 LAB — COMPREHENSIVE METABOLIC PANEL
ALBUMIN: 3.3 g/dL — AB (ref 3.5–5.0)
ALK PHOS: 70 U/L (ref 38–126)
ALT: 12 U/L (ref 0–44)
AST: 12 U/L — ABNORMAL LOW (ref 15–41)
Anion gap: 10 (ref 5–15)
BILIRUBIN TOTAL: 0.9 mg/dL (ref 0.3–1.2)
BUN: 19 mg/dL (ref 8–23)
CALCIUM: 9.6 mg/dL (ref 8.9–10.3)
CO2: 27 mmol/L (ref 22–32)
CREATININE: 0.87 mg/dL (ref 0.44–1.00)
Chloride: 101 mmol/L (ref 98–111)
GFR calc Af Amer: >60 mL/min (ref >60)
GFR calc non Af Amer: >60 mL/min (ref >60)
GLUCOSE: 117 mg/dL — AB (ref 70–99)
Potassium: 3.7 mmol/L (ref 3.5–5.1)
Sodium: 138 mmol/L (ref 135–145)
TOTAL PROTEIN: 6.2 g/dL — AB (ref 6.5–8.1)

## 2017-08-03 LAB — CBC WITH DIFFERENTIAL/PLATELET
BASOS PCT: 0 %
Basophils Absolute: 0 10*3/uL (ref 0.0–0.1)
Eosinophils Absolute: 0 10*3/uL (ref 0.0–0.7)
Eosinophils Relative: 0 %
HEMATOCRIT: 25 % — AB (ref 36.0–46.0)
HEMOGLOBIN: 8.2 g/dL — AB (ref 12.0–15.0)
LYMPHS ABS: 0.3 10*3/uL — AB (ref 0.7–4.0)
Lymphocytes Relative: 7 %
MCH: 28.4 pg (ref 26.0–34.0)
MCHC: 32.8 g/dL (ref 30.0–36.0)
MCV: 86.5 fL (ref 78.0–100.0)
Monocytes Absolute: 1 10*3/uL (ref 0.1–1.0)
Monocytes Relative: 27 %
NEUTROS ABS: 2.4 10*3/uL (ref 1.7–7.7)
NEUTROS PCT: 66 %
Platelets: 65 10*3/uL — ABNORMAL LOW (ref 150–400)
RBC: 2.89 MIL/uL — AB (ref 3.87–5.11)
RDW: 17 % — ABNORMAL HIGH (ref 11.5–15.5)
WBC: 3.7 10*3/uL — ABNORMAL LOW (ref 4.0–10.5)

## 2017-08-03 LAB — GLUCOSE, CAPILLARY
GLUCOSE-CAPILLARY: 116 mg/dL — AB (ref 70–99)
Glucose-Capillary: 100 mg/dL — ABNORMAL HIGH (ref 70–99)
Glucose-Capillary: 99 mg/dL (ref 70–99)

## 2017-08-03 MED ORDER — SODIUM CHLORIDE 0.9 % IV SOLN
3.0000 g | Freq: Once | INTRAVENOUS | Status: AC
  Administered 2017-08-03: 3 g via INTRAVENOUS
  Filled 2017-08-03: qty 3

## 2017-08-03 MED ORDER — ENOXAPARIN SODIUM 40 MG/0.4ML ~~LOC~~ SOLN: 40.0000 mg | SUBCUTANEOUS | Status: DC

## 2017-08-03 MED ORDER — ONDANSETRON HCL 4 MG/2ML IJ SOLN
4.0000 mg | Freq: Four times a day (QID) | INTRAMUSCULAR | Status: DC | PRN
  Administered 2017-08-08: 4 mg via INTRAVENOUS
  Filled 2017-08-03: qty 2

## 2017-08-03 MED ORDER — DOCUSATE SODIUM 100 MG PO CAPS
100.0000 mg | ORAL_CAPSULE | Freq: Two times a day (BID) | ORAL | Status: DC
Start: 2017-08-03 — End: 2017-08-10
  Administered 2017-08-03 – 2017-08-10 (×15): 100 mg via ORAL
  Filled 2017-08-03 (×15): qty 1

## 2017-08-03 MED ORDER — INSULIN ASPART 100 UNIT/ML ~~LOC~~ SOLN: 0.0000 [IU] | Freq: Three times a day (TID) | SUBCUTANEOUS | Status: DC

## 2017-08-03 MED ORDER — IOPAMIDOL (ISOVUE-300) INJECTION 61%: 75.0000 mL | Freq: Once | INTRAVENOUS | Status: DC | PRN

## 2017-08-03 MED ORDER — GABAPENTIN 100 MG PO CAPS
100.0000 mg | ORAL_CAPSULE | Freq: Three times a day (TID) | ORAL | Status: DC
  Administered 2017-08-03 – 2017-08-10 (×23): 100 mg via ORAL
  Filled 2017-08-03 (×23): qty 1

## 2017-08-03 MED ORDER — FERROUS SULFATE 325 (65 FE) MG PO TABS
325.0000 mg | ORAL_TABLET | Freq: Every day | ORAL | Status: DC
  Administered 2017-08-03 – 2017-08-05 (×3): 325 mg via ORAL
  Filled 2017-08-03 (×3): qty 1

## 2017-08-03 MED ORDER — IOHEXOL 300 MG/ML  SOLN
75.0000 mL | Freq: Once | INTRAMUSCULAR | Status: AC | PRN
  Administered 2017-08-03: 75 mL via INTRAVENOUS

## 2017-08-03 MED ORDER — METFORMIN HCL 500 MG PO TABS
500.0000 mg | ORAL_TABLET | Freq: Two times a day (BID) | ORAL | Status: DC
  Administered 2017-08-03 – 2017-08-05 (×6): 500 mg via ORAL
  Filled 2017-08-03 (×6): qty 1

## 2017-08-03 MED ORDER — SODIUM CHLORIDE 0.9 % IV SOLN
INTRAVENOUS | Status: AC
  Administered 2017-08-03: 09:00:00 via INTRAVENOUS

## 2017-08-03 MED ORDER — SODIUM CHLORIDE 0.9 % IV SOLN
3.0000 g | Freq: Four times a day (QID) | INTRAVENOUS | Status: DC
  Administered 2017-08-03 – 2017-08-07 (×16): 3 g via INTRAVENOUS
  Filled 2017-08-03 (×17): qty 3

## 2017-08-03 MED ORDER — SODIUM CHLORIDE 0.9 % IV BOLUS
1000.0000 mL | Freq: Once | INTRAVENOUS | Status: AC
  Administered 2017-08-03: 1000 mL via INTRAVENOUS

## 2017-08-03 MED ORDER — ACETAMINOPHEN 650 MG RE SUPP: 650.0000 mg | Freq: Four times a day (QID) | RECTAL | Status: DC | PRN

## 2017-08-03 MED ORDER — FENTANYL CITRATE (PF) 100 MCG/2ML IJ SOLN
100.0000 ug | INTRAMUSCULAR | Status: DC | PRN
  Administered 2017-08-03 – 2017-08-10 (×15): 100 ug via INTRAVENOUS
  Filled 2017-08-03 (×15): qty 2

## 2017-08-03 MED ORDER — MORPHINE SULFATE ER 30 MG PO TBCR
30.0000 mg | EXTENDED_RELEASE_TABLET | Freq: Two times a day (BID) | ORAL | Status: DC
  Administered 2017-08-03 – 2017-08-10 (×15): 30 mg via ORAL
  Filled 2017-08-03 (×15): qty 1

## 2017-08-03 MED ORDER — HYDROMORPHONE HCL 1 MG/ML IJ SOLN
0.5000 mg | INTRAMUSCULAR | Status: DC | PRN
  Administered 2017-08-03 – 2017-08-04 (×2): 0.5 mg via INTRAVENOUS
  Filled 2017-08-03 (×2): qty 0.5

## 2017-08-03 MED ORDER — ACETAMINOPHEN 325 MG PO TABS
650.0000 mg | ORAL_TABLET | Freq: Four times a day (QID) | ORAL | Status: DC | PRN
  Administered 2017-08-07: 650 mg via ORAL
  Filled 2017-08-03: qty 2

## 2017-08-03 MED ORDER — ONDANSETRON HCL 4 MG/2ML IJ SOLN
4.0000 mg | Freq: Once | INTRAMUSCULAR | Status: AC
  Administered 2017-08-03: 4 mg via INTRAVENOUS
  Filled 2017-08-03: qty 2

## 2017-08-03 MED ORDER — INSULIN ASPART 100 UNIT/ML ~~LOC~~ SOLN: 0.0000 [IU] | Freq: Every day | SUBCUTANEOUS | Status: DC

## 2017-08-03 MED ORDER — FENTANYL CITRATE (PF) 100 MCG/2ML IJ SOLN
100.0000 ug | Freq: Once | INTRAMUSCULAR | Status: AC
  Administered 2017-08-03: 100 ug via INTRAVENOUS
  Filled 2017-08-03: qty 2

## 2017-08-03 MED ORDER — POLYETHYLENE GLYCOL 3350 17 G PO PACK: 17.0000 g | PACK | Freq: Every day | ORAL | Status: DC | PRN

## 2017-08-03 NOTE — Care Management Obs Status (Signed)
[facility] NOTIFICATION   Patient Details  Name: [name] MRN: [id number] Date of Birth: 1940/07/23   Medicare Observation Status Notification Given:  Yes    MahabirJuliann Pulse, RN 08/03/2017, 4:37 PM

## 2017-08-03 NOTE — ED Provider Notes (Signed)
[facility] DEPT Provider Note: [name], MD, FACEP  CSN: [id number] MRN: [id number] ARRIVAL: 08/02/17 at 68 ROOM: [place]  CA Pt Vaginal Pain   HISTORY OF PRESENT ILLNESS  08/03/17 12:31 AM [name] is a 77 y.o. female with history of vulvar cancer who underwent surgical vulvectomy in the past.  She has had radiation therapy in the past but none in the past year.  She is currently undergoing chemotherapy, the last session was July 5 at which time she received carboplatin and Taxol.  She was given per filgrastim infusion on July 8.  She is here with a burning pain in the vulva which has become severe despite taking her MS Contin 30 mg twice daily.  She has some drainage which is worse than usual.  She states her vulva is more erythematous and swollen than usual.  Pain is worse with movement and improved with lying still.  She has not been able to get up and active due to the pain.  She denies fever.   Past Medical History:  Diagnosis Date  . Aortic valve stenosis, mild    per last echo in epic dated 10-31-2014  mild to moderate AVS,  valve area 1.46cm^2  . Arthritis   . Heart murmur   . History of GI bleed 04/ 2018;  04-20-2017   04/ 2018  upper GI, normal EGD, x1 unit PRBCs/  04-20-2017  upper GI, hg 5.5, normal EGD, x2 units PRBCs  . History of peptic ulcer   . History of radiation therapy 06/05/16 - 07/25/16   Pelvis (vulvar and bilateral inguinal) treated to 45 Gy in 25 fractions, then Boosted an additional 18 Gy in 9 fractions, vulvar region  . History of rheumatic fever age 40  . Hypertension   . Microcytic anemia   . Neuropathic pain    right goin  . PONV (postoperative nausea and vomiting)    years ago with back surgery-nausea  . Pulmonary nodules    last CT  10-07-2016, benign  . Recurrent vulvar cancer [facility]) oncologist-- dr rossi/  dr Lurline Idol   dx 01/ 2018  s/p vulvectomy 02-27-2016 , 04-09-2016 & 10-17-2016-- completed external  radiation 07-25-2016:  recurrent Stage 3A SCC bilateral vulvar with positive right inguinal node  . Type 2 diabetes mellitus ([place])   . Urinary incontinence due to urethral sphincter incompetence    weakness post vulvar radiation  . Vitamin D deficiency     Past Surgical History:  Procedure Laterality Date  . ABDOMINAL HYSTERECTOMY  1979   W/ BILATERAL SALPINGOOPHORECTOMY  . ANTERIOR CERVICAL DECOMP/DISCECTOMY FUSION  11-09-2002   dr Hal Neer  [facility]   C3--6  . CATARACT EXTRACTION W/ INTRAOCULAR LENS  IMPLANT, BILATERAL  yrs ago  . COLONOSCOPY N/A 05/05/2016   Procedure: COLONOSCOPY;  Surgeon: Daneil Dolin, MD;  [facility];  Service: Endoscopy;  Laterality: N/A;  . DEBRIDEMENT AND CLOSURE WOUND N/A 05/15/2017   Procedure: DEBRIDEMENT VULVA  WOUND;  Surgeon: Everitt Amber, MD;  Location: [facility];  Service: Gynecology;  Laterality: N/A;  . ESOPHAGOGASTRODUODENOSCOPY N/A 05/04/2016   Procedure: ESOPHAGOGASTRODUODENOSCOPY (EGD);  Surgeon: Daneil Dolin, MD;  [facility];  Service: Endoscopy;  Laterality: N/A;  . ESOPHAGOGASTRODUODENOSCOPY N/A 04/21/2017   Procedure: ESOPHAGOGASTRODUODENOSCOPY (EGD);  Surgeon: Ladene Artist, MD;  Location: [facility];  Service: Endoscopy;  Laterality: N/A;  . GIVENS CAPSULE STUDY N/A 05/06/2016   Procedure: GIVENS CAPSULE STUDY;  Surgeon:  Daneil Dolin, MD;  [facility];  Service: Endoscopy;  Laterality: N/A;  . KNEE ARTHROSCOPY Left 1980s;   05-18-2002   dr Rhona Raider  [facility]  . LUMBAR SPINE SURGERY  1991    dr Tonita Cong  . RADICAL VULVECTOMY Left 04/09/2016   Procedure: RADICAL LEFT VULVECTOMY;  Surgeon: Everitt Amber, MD;  Location: [facility];  Service: Gynecology;  Laterality: Left;  . RADICAL VULVECTOMY  10/17/2016   Procedure: RADICAL ANTERIOR VULVECTOMY;  Surgeon: Everitt Amber, MD;  Location: [facility];  Service: Gynecology;;  . TOE SURGERY Left   . TONSILLECTOMY  1967  . TOTAL HIP ARTHROPLASTY Left 10/27/2013    Procedure: LEFT TOTAL HIP ARTHROPLASTY;  Surgeon: Tobi Bastos, MD;  Location: [facility];  Service: Orthopedics;  Laterality: Left;  . TOTAL KNEE ARTHROPLASTY Right 03-28-2010    dr Gladstone Lighter  [facility]  . TRANSTHORACIC ECHOCARDIOGRAM  10/31/2014   mild focal basal hypertrophy of the septum, ef 83-41%, grade 1 diastolic dysfunction/  moderate AV stenosis, valve area 1.46cm^2/  trivial MR/  mild PR and TR/  mild RAE/ PASP 61mHg  . VULVA /PERINEUM BIOPSY  02/22/2016  . VULVA /Milagros LollBIOPSY N/A 05/15/2017   Procedure: VULVAR BIOPSY;  Surgeon: REveritt Amber MD;  [facility]  Service: Gynecology;  Laterality: N/A;  . VULVECTOMY Right 02/27/2016   Procedure: RADICAL RIGHT VULVECTOMY WITH RIGHT INGUINAL LYMPH NODE DISSECTION;  Surgeon: PNancy Marus MD;  Location: [facility];  Service: Gynecology;  Laterality: Right;    Family History  Problem Relation Age of Onset  . Hypertension Mother   . Hypertension Father   . Throat cancer Father   . Cancer Sister        Breast  . Hypertension Brother     Social History   Tobacco Use  . Smoking status: Never Smoker  . Smokeless tobacco: Never Used  Substance Use Topics  . Alcohol use: No    Alcohol/week: 0.0 oz  . Drug use: No    Prior to Admission medications   Medication Sig Start Date End Date Taking? Authorizing Provider  blood glucose meter kit and supplies KIT Dispense based on patient and insurance preference. Use up to three times daily as directed (FOR ICD-10 E11.9). 06/03/17   LMikey Kirschner MD  collagenase (SANTYL) ointment Apply 1 application topically daily. Apply to vulva wound bed once daily with dressing changes 06/18/17   CJoylene JohnD, NP  dexamethasone (DECADRON) 4 MG tablet Take 3 tabs the night before and 3 tabs the morning of chemotherapy, every 3 weeks, with food 06/03/17   GHeath Lark MD  ferrous sulfate 325 (65 FE) MG tablet Take 1 tablet (325 mg total) by mouth daily with breakfast. 04/11/17   HNilda Simmer NP  gabapentin (NEURONTIN) 300 MG capsule Take 1 capsule (300 mg total) by mouth 3 (three) times daily. Patient taking differently: Take 300 mg by mouth 3 (three) times daily.  04/11/17   HNilda Simmer NP  HYDROcodone-acetaminophen (NORCO) 10-325 MG tablet Take 1 tablet by mouth every 4 (four) hours as needed. 07/02/17   REveritt Amber MD  losartan (COZAAR) 50 MG tablet TAKE ONE TABLET BY MOUTH ONCE DAILY.--- takes in am 06/30/17   LMikey Kirschner MD  metFORMIN (GLUCOPHAGE) 500 MG tablet TAKE (1) TABLET BY MOUTH TWICE DAILY. 04/15/17   LMikey Kirschner MD  morphine (MS CONTIN) 30 MG 12 hr tablet Take 1 tablet (30 mg total) by mouth every 12 (  twelve) hours. 07/21/17   Cross, Lenna Sciara D, NP  ondansetron (ZOFRAN) 8 MG tablet Take 1 tablet (8 mg total) by mouth 2 (two) times daily as needed for refractory nausea / vomiting. Start on day 3 after chemo. 06/03/17   Heath Lark, MD  ondansetron (ZOFRAN) 8 MG tablet Take 1 tablet (8 mg total) by mouth every 8 (eight) hours as needed for nausea. 06/03/17   Heath Lark, MD  oxyCODONE-acetaminophen (PERCOCET) 10-325 MG tablet Take 1 tablet by mouth every 4 (four) hours as needed for pain. 05/15/17   Everitt Amber, MD  potassium chloride SA (K-DUR,KLOR-CON) 20 MEQ tablet TAKE 1 TABLET BY MOUTH ONCE A DAY. Patient taking differently: TAKE 1 TABLET BY MOUTH ONCE A DAY.  --- takes in am 11/29/16   Mikey Kirschner, MD  prochlorperazine (COMPAZINE) 10 MG tablet Take 1 tablet (10 mg total) by mouth every 6 (six) hours as needed (Nausea or vomiting). 06/03/17   Heath Lark, MD  prochlorperazine (COMPAZINE) 10 MG tablet Take 1 tablet (10 mg total) by mouth every 6 (six) hours as needed for nausea or vomiting. 06/03/17   Heath Lark, MD  sodium chloride flush 0.9 % SOLN injection Inject 10 mLs into the vein daily. 07/14/17   Heath Lark, MD    Allergies Oxycodone; Celebrex [celecoxib]; Doxycycline; Iron; Lodine [etodolac]; Nabumetone; Tetracycline; and Xarelto  [rivaroxaban]   REVIEW OF SYSTEMS  Negative except as noted here or in the History of Present Illness.   PHYSICAL EXAMINATION  Initial Vital Signs Blood pressure 136/82, pulse (!) 115, temperature 98 F (36.7 C), resp. rate (!) 22, SpO2 100 %.  Examination General: Well-developed, well-nourished female in no acute distress; appearance consistent with age of record HENT: normocephalic; atraumatic Eyes: pupils equal, round and reactive to light; extraocular muscles intact Neck: supple Heart: regular rate and rhythm; crescendo/decrescendo systolic murmur at the right upper sternal border Lungs: clear to auscultation bilaterally Abdomen: soft; nondistended; lower abdominal tenderness; bowel sounds present GU: Erythema, swelling and tenderness of the peri-vulvar's soft tissue with necrotic ulceration:    Extremities: No deformity; full range of motion; pulses normal Neurologic: Awake, alert and oriented; motor function intact in all extremities and symmetric; no facial droop Skin: Warm and dry; pallor; alopecia Psychiatric: Normal mood and affect   RESULTS  Summary of this visit's results, reviewed by myself:   EKG Interpretation  Date/Time:    Ventricular Rate:    PR Interval:    QRS Duration:   QT Interval:    QTC Calculation:   R Axis:     Text Interpretation:        Laboratory Studies: Results for orders placed or performed during the hospital encounter of 08/02/17 (from the past 24 hour(s))  CBC with Differential/Platelet     Status: Abnormal   Collection Time: 08/03/17  1:06 AM  Result Value Ref Range   WBC 3.7 (L) 4.0 - 10.5 K/uL   RBC 2.89 (L) 3.87 - 5.11 MIL/uL   Hemoglobin 8.2 (L) 12.0 - 15.0 g/dL   HCT 25.0 (L) 36.0 - 46.0 %   MCV 86.5 78.0 - 100.0 fL   MCH 28.4 26.0 - 34.0 pg   MCHC 32.8 30.0 - 36.0 g/dL   RDW 17.0 (H) 11.5 - 15.5 %   Platelets 65 (L) 150 - 400 K/uL   Neutrophils Relative % 66 %   Neutro Abs 2.4 1.7 - 7.7 K/uL   Lymphocytes  Relative 7 %   Lymphs Abs 0.3 (L) 0.7 -  4.0 K/uL   Monocytes Relative 27 %   Monocytes Absolute 1.0 0.1 - 1.0 K/uL   Eosinophils Relative 0 %   Eosinophils Absolute 0.0 0.0 - 0.7 K/uL   Basophils Relative 0 %   Basophils Absolute 0.0 0.0 - 0.1 K/uL  Comprehensive metabolic panel     Status: Abnormal   Collection Time: 08/03/17  1:06 AM  Result Value Ref Range   Sodium 138 135 - 145 mmol/L   Potassium 3.7 3.5 - 5.1 mmol/L   Chloride 101 98 - 111 mmol/L   CO2 27 22 - 32 mmol/L   Glucose, Bld 117 (H) 70 - 99 mg/dL   BUN 19 8 - 23 mg/dL   Creatinine, Ser 0.87 0.44 - 1.00 mg/dL   Calcium 9.6 8.9 - 10.3 mg/dL   Total Protein 6.2 (L) 6.5 - 8.1 g/dL   Albumin 3.3 (L) 3.5 - 5.0 g/dL   AST 12 (L) 15 - 41 U/L   ALT 12 0 - 44 U/L   Alkaline Phosphatase 70 38 - 126 U/L   Total Bilirubin 0.9 0.3 - 1.2 mg/dL   GFR calc non Af Amer >60 >60 mL/min   GFR calc Af Amer >60 >60 mL/min   Anion gap 10 5 - 15   Imaging Studies: Ct Pelvis W Contrast  Result Date: 08/03/2017 CLINICAL DATA:  77 year old female with malignant neoplasm of the vulva. EXAM: CT PELVIS WITH CONTRAST TECHNIQUE: Multidetector CT imaging of the pelvis was performed using the standard protocol following the bolus administration of intravenous contrast. CONTRAST:  32m OMNIPAQUE IOHEXOL 300 MG/ML  SOLN COMPARISON:  CT of the abdomen pelvis dated 04/30/2017 FINDINGS: Urinary Tract: The visualized distal ureters and urinary bladder appear unremarkable. Bowel: There is sigmoid diverticulosis without active inflammatory changes. No dilated bowel in the pelvis. Vascular/Lymphatic: Mild atherosclerotic disease.  No adenopathy. Reproductive: Hysterectomy. There is a 3.6 x 2.9 cm nodular density in the skin and subcutaneous tissues of the right vulvar region. There is irregularity of the overlying skin which may represent ulceration. Multiple additional nodules noted in the volar region and extending into the perineal subcutaneous soft tissues  and adjacent to the vaginal orifice and anterior to the rectum. For example there is a 2.6 x 1.2 cm nodular lesion over the left vulvar area and a 12 x 10 mm nodular lesion in the left perineum (series 4 image 112). There has been interval increase in the size of the vulvar lesion and development of new vulvar and perineal lesions compared to the prior CT. Other:  None Musculoskeletal: Total left hip arthroplasty. Osteopenia and degenerative changes of the spine. No acute osseous pathology. IMPRESSION: 1. Interval increase in the size and extent of vulvar mass with development of new vulvar and perineal lesions since the prior CT. 2. No evidence of metastatic disease within the pelvis. Electronically Signed   By: AAnner CreteM.D.   On: 08/03/2017 04:28    ED COURSE and MDM  Nursing notes and initial vitals signs, including pulse oximetry, reviewed.  Vitals:   08/03/17 0200 08/03/17 0230 08/03/17 0300 08/03/17 0301  BP: (!) 89/60 96/67 (!) 100/47 (!) 100/47  Pulse: (!) 102 (!) 101 97 98  Resp: '18 19 18 14  '$ Temp:      SpO2: 91% 96% 97% 96%   4:54 AM We will start antibiotics for possible infected vulvar ulcer.  We will have her admitted to the hospitalist service with oncology consultation.   PROCEDURES    ED  DIAGNOSES     ICD-10-CM   1. Infected ulcer of skin, with fat layer exposed ([place]) L98.492    L08.9   2. Recurrent squamous cell carcinoma of vulva (HCC) C51.9   3. Anemia due to chemotherapy D64.81    T45.1X5A        Ruth Tully, Jenny Reichmann, MD 08/03/17 [phone]

## 2017-08-03 NOTE — Progress Notes (Signed)
  Radiation Oncology         [phone] ________________________________  Name: [name] MRN: [id number]  Date: 08/02/2017  DOB: [birth year]  Chart Note:  I reviewed this patient's most recent findings and wanted to take a minute to document my impression.  This patient is about 1 year s/p surgery and radiotherapy for Stage IIIA (pT1b, pN1a) moderately differentiated squamous cell carcinoma of the right vulva.  Recently, she has been followed by Dr. Alvy Bimler for healing vulvar lesion and pain management along with continued systemic therapy.  She is now admitted with pain and vulvar drainage.  Agree with antibiotics and pain management until stable.  No current role for radiation therapy or radiation-specific follow-up care.  Will follow.  ________________________________  Sheral Apley Tammi Klippel, M.D.

## 2017-08-03 NOTE — ED Notes (Signed)
ED TO INPATIENT HANDOFF REPORT  Name/Age/Gender [name] 77 y.o. female  Code Status Code Status History    Date Active Date Inactive Code Status Order ID Comments User Context   05/15/2017 1350 05/16/2017 1356 Full Code [id number]  Everitt Amber, MD Inpatient   04/20/2017 1649 04/22/2017 1809 Full Code [id number]  Georgette Shell, MD ED   10/17/2016 1755 10/18/2016 1502 Full Code [id number]  Lahoma Crocker, MD Inpatient   05/03/2016 1808 05/07/2016 1619 Full Code [id number]  Truett Mainland, DO Inpatient   02/27/2016 1629 02/28/2016 1421 Full Code [id number]  Lahoma Crocker, MD Inpatient   10/27/2013 1632 10/29/2013 1706 Full Code [id number]  Tobi Bastos, MD Inpatient      Home/SNF/Other Home  Chief Complaint Vaginal Pain  Level of Care/Admitting Diagnosis ED Disposition    ED Disposition Condition Comment   [place] [facility] Area: [facility] [[id number]]  Level of Care: Telemetry [5]  Admit to tele based on following criteria: Monitor for Ischemic changes  Diagnosis: Vulvar pain [573220]  Admitting Physician: Jani Gravel [[id number]]  Attending Physician: Jani Gravel [[id number]]  PT Class (Do Not Modify): Observation [104]  PT Acc Code (Do Not Modify): Observation [10022]       Medical History Past Medical History:  Diagnosis Date  . Aortic valve stenosis, mild    per last echo in epic dated 10-31-2014  mild to moderate AVS,  valve area 1.46cm^2  . Arthritis   . Heart murmur   . History of GI bleed 04/ 2018;  04-20-2017   04/ 2018  upper GI, normal EGD, x1 unit PRBCs/  04-20-2017  upper GI, hg 5.5, normal EGD, x2 units PRBCs  . History of peptic ulcer   . History of radiation therapy 06/05/16 - 07/25/16   Pelvis (vulvar and bilateral inguinal) treated to 45 Gy in 25 fractions, then Boosted an additional 18 Gy in 9 fractions, vulvar region  . History of rheumatic fever age 10  . Hypertension   . Microcytic anemia   . Neuropathic pain    right goin  . PONV  (postoperative nausea and vomiting)    years ago with back surgery-nausea  . Pulmonary nodules    last CT  10-07-2016, benign  . Recurrent vulvar cancer [facility]) oncologist-- dr rossi/  dr Lurline Idol   dx 01/ 2018  s/p vulvectomy 02-27-2016 , 04-09-2016 & 10-17-2016-- completed external radiation 07-25-2016:  recurrent Stage 3A SCC bilateral vulvar with positive right inguinal node  . Type 2 diabetes mellitus ([place])   . Urinary incontinence due to urethral sphincter incompetence    weakness post vulvar radiation  . Vitamin D deficiency     Allergies Allergies  Allergen Reactions  . Oxycodone Other (See Comments)    Intense H/A  . Celebrex [Celecoxib] Other (See Comments)    GI upset   . Doxycycline Nausea Only    Gi upset Pt is able to tolerate with food  . Iron Other (See Comments)    Pt cannot tolerate some iron preparations. The upset her stomach.  . Lodine [Etodolac] Nausea Only    Gi upset  . Nabumetone Other (See Comments)    (relafen)  Gi upset  . Tetracycline Nausea And Vomiting  . Xarelto [Rivaroxaban] Hives    IV Location/Drains/Wounds Patient Lines/Drains/Airways Status   Active Line/Drains/Airways    Name:   Placement date:   Placement time:   Site:   Days:   Peripheral IV 04/30/17 Left;Lateral Antecubital   04/30/17  1331    Antecubital   95   PICC Double Lumen 02/72/53 PICC Right Basilic 43 cm 1 cm   66/44/03    1623     58   Urethral Catheter Dr. Denman George Latex 16 Fr.   10/17/16    1250    Latex   290   Incision (Closed) 02/27/16 Groin   02/27/16    1357     523   Incision (Closed) 02/27/16 Perineum   02/27/16    1441     523   Incision (Closed) 04/09/16 Perineum   04/09/16    1707     481   Incision (Closed) 10/17/16 Perineum   10/17/16    1339     290   Incision (Closed) 05/15/17 Vagina   05/15/17    1105     80          Labs/Imaging Results for orders placed or performed during the hospital encounter of 08/02/17 (from the past 48 hour(s))  CBC with  Differential/Platelet     Status: Abnormal   Collection Time: 08/03/17  1:06 AM  Result Value Ref Range   WBC 3.7 (L) 4.0 - 10.5 K/uL   RBC 2.89 (L) 3.87 - 5.11 MIL/uL   Hemoglobin 8.2 (L) 12.0 - 15.0 g/dL   HCT 25.0 (L) 36.0 - 46.0 %   MCV 86.5 78.0 - 100.0 fL   MCH 28.4 26.0 - 34.0 pg   MCHC 32.8 30.0 - 36.0 g/dL   RDW 17.0 (H) 11.5 - 15.5 %   Platelets 65 (L) 150 - 400 K/uL    Comment: SPECIMEN CHECKED FOR CLOTS REPEATED TO VERIFY PLATELET COUNT CONFIRMED BY SMEAR    Neutrophils Relative % 66 %   Neutro Abs 2.4 1.7 - 7.7 K/uL   Lymphocytes Relative 7 %   Lymphs Abs 0.3 (L) 0.7 - 4.0 K/uL   Monocytes Relative 27 %   Monocytes Absolute 1.0 0.1 - 1.0 K/uL   Eosinophils Relative 0 %   Eosinophils Absolute 0.0 0.0 - 0.7 K/uL   Basophils Relative 0 %   Basophils Absolute 0.0 0.0 - 0.1 K/uL    Comment: Performed at [facility], [place] [address] [place], [place] [address]  Comprehensive metabolic panel     Status: Abnormal   Collection Time: 08/03/17  1:06 AM  Result Value Ref Range   Sodium 138 135 - 145 mmol/L   Potassium 3.7 3.5 - 5.1 mmol/L   Chloride 101 98 - 111 mmol/L    Comment: Please note change in reference range.   CO2 27 22 - 32 mmol/L   Glucose, Bld 117 (H) 70 - 99 mg/dL    Comment: Please note change in reference range.   BUN 19 8 - 23 mg/dL    Comment: Please note change in reference range.   Creatinine, Ser 0.87 0.44 - 1.00 mg/dL   Calcium 9.6 8.9 - 10.3 mg/dL   Total Protein 6.2 (L) 6.5 - 8.1 g/dL   Albumin 3.3 (L) 3.5 - 5.0 g/dL   AST 12 (L) 15 - 41 U/L   ALT 12 0 - 44 U/L    Comment: Please note change in reference range.   Alkaline Phosphatase 70 38 - 126 U/L   Total Bilirubin 0.9 0.3 - 1.2 mg/dL   GFR calc non Af Amer >60 >60 mL/min   GFR calc Af Amer >60 >60 mL/min    Comment: (NOTE) The eGFR has been calculated using  the CKD EPI equation. This calculation has not been validated in all clinical situations. eGFR's persistently  <60 mL/min signify possible Chronic Kidney Disease.    Anion gap 10 5 - 15    Comment: Performed at [facility], [place] [address], [place], [place] [address]   Ct Pelvis W Contrast  Result Date: 08/03/2017 CLINICAL DATA:  77 year old female with malignant neoplasm of the vulva. EXAM: CT PELVIS WITH CONTRAST TECHNIQUE: Multidetector CT imaging of the pelvis was performed using the standard protocol following the bolus administration of intravenous contrast. CONTRAST:  46m OMNIPAQUE IOHEXOL 300 MG/ML  SOLN COMPARISON:  CT of the abdomen pelvis dated 04/30/2017 FINDINGS: Urinary Tract: The visualized distal ureters and urinary bladder appear unremarkable. Bowel: There is sigmoid diverticulosis without active inflammatory changes. No dilated bowel in the pelvis. Vascular/Lymphatic: Mild atherosclerotic disease.  No adenopathy. Reproductive: Hysterectomy. There is a 3.6 x 2.9 cm nodular density in the skin and subcutaneous tissues of the right vulvar region. There is irregularity of the overlying skin which may represent ulceration. Multiple additional nodules noted in the volar region and extending into the perineal subcutaneous soft tissues and adjacent to the vaginal orifice and anterior to the rectum. For example there is a 2.6 x 1.2 cm nodular lesion over the left vulvar area and a 12 x 10 mm nodular lesion in the left perineum (series 4 image 112). There has been interval increase in the size of the vulvar lesion and development of new vulvar and perineal lesions compared to the prior CT. Other:  None Musculoskeletal: Total left hip arthroplasty. Osteopenia and degenerative changes of the spine. No acute osseous pathology. IMPRESSION: 1. Interval increase in the size and extent of vulvar mass with development of new vulvar and perineal lesions since the prior CT. 2. No evidence of metastatic disease within the pelvis. Electronically Signed   By: AAnner CreteM.D.   On: 08/03/2017  04:28    Pending Labs Unresulted Labs (From admission, onward)   Start     Ordered   08/03/17 0202  Wound or Superficial Culture  Once,   STAT     08/03/17 0202      Vitals/Pain Today's Vitals   08/03/17 0300 08/03/17 0301 08/03/17 0504 08/03/17 0520  BP: (!) 100/47 (!) 100/47  118/69  Pulse: 97 98  (!) 105  Resp: '18 14  17  '$ Temp:    98.7 F (37.1 C)  TempSrc:    Oral  SpO2: 97% 96%  99%  PainSc:   5  6     Isolation Precautions No active isolations  Medications Medications  fentaNYL (SUBLIMAZE) injection 100 mcg (100 mcg Intravenous Given 08/03/17 0346)  sodium chloride 0.9 % bolus 1,000 mL (1,000 mLs Intravenous New Bag/Given 08/03/17 0519)  Ampicillin-Sulbactam (UNASYN) 3 g in sodium chloride 0.9 % 100 mL IVPB (3 g Intravenous New Bag/Given 08/03/17 0519)  ondansetron (ZOFRAN) injection 4 mg (4 mg Intravenous Given 08/03/17 0115)  fentaNYL (SUBLIMAZE) injection 100 mcg (100 mcg Intravenous Given 08/03/17 0116)  iohexol (OMNIPAQUE) 300 MG/ML solution 75 mL (75 mLs Intravenous Contrast Given 08/03/17 0321)    Mobility manual wheelchair for right now too weak

## 2017-08-03 NOTE — Progress Notes (Signed)
[name]  is a 77 y.o. female, w vulvar cancer, dm2, aortic stenosis, h/o GI bleeding, apparently c/o vulvar pain uncontrolled with current medication (ms contin 30mg  po bid).  Pt thus presented to ED.  Admitted for vulvar pain and suspected infection.  Radiation oncology consulted, Dr Tammi Klippel. Appreciate input. C/w current management.  Please refer to H&P dictated by Dr Maudie Mercury for further details of the assessment and plan.

## 2017-08-03 NOTE — Progress Notes (Signed)
Pharmacy Antibiotic Note  [name] is a 77 y.o. female admitted on 08/02/2017 with vulva cancer wound.  Pharmacy has been consulted for Unasyn dosing.  Plan: Unasyn 3g IV q6h Follow up renal fxn, culture results, and clinical course.      Temp (24hrs), Avg:98.4 F (36.9 C), Min:98 F (36.7 C), Max:98.7 F (37.1 C)  Recent Labs  Lab 08/03/17 0106  WBC 3.7*  CREATININE 0.87    Estimated Creatinine Clearance: 55.4 mL/min (by C-G formula based on SCr of 0.87 mg/dL).    Allergies  Allergen Reactions  . Oxycodone Other (See Comments)    Intense H/A  . Celebrex [Celecoxib] Other (See Comments)    GI upset   . Doxycycline Nausea Only    Gi upset Pt is able to tolerate with food  . Iron Other (See Comments)    Pt cannot tolerate some iron preparations. The upset her stomach.  . Lodine [Etodolac] Nausea Only    Gi upset  . Nabumetone Other (See Comments)    (relafen)  Gi upset  . Tetracycline Nausea And Vomiting  . Xarelto [Rivaroxaban] Hives    Antimicrobials this admission: 7/14 Unasyn >>  Dose adjustments this admission:   Microbiology results: 7/14 Wound >>   Thank you for allowing pharmacy to be a part of this patient's care.  Gretta Arab PharmD, BCPS Pager [phone] 08/03/2017 6:09 AM

## 2017-08-03 NOTE — H&P (Signed)
TRH H&P   Patient Demographics:    [name], is a 77 y.o. female  MRN: [id number]   DOB - 08-25-1940  Admit Date - 08/02/2017  Outpatient Primary MD for the patient is Wolfgang Phoenix Grace Bushy, MD  Referring MD/NP/PA: Towanda Malkin  Outpatient Specialists:  Heath L[facility]  Patient coming from: home  Chief Complaint  Patient presents with  . CA Pt Vaginal Pain      HPI:    [name]  is a 77 y.o. female, w vulvar cancer, dm2, aortic stenosis, h/o GI bleeding, apparently c/o vulvar pain uncontrolled with current medication (ms contin 29m po bid).  Pt thus presented to ED   In Ed,   CT pelvis IMPRESSION: 1. Interval increase in the size and extent of vulvar mass with development of new vulvar and perineal lesions since the prior CT. 2. No evidence of metastatic disease within the pelvis.  Wbc 3.7, hgb 8.2, Plt 65 Na 138, K 3.7, Ast 12, Alt 12 Bun 19, Creatinine 0.87  Glucose 117  Pt will be admitted for pain control from vulvar wound and cancer and ? Wound infection and pancytopenia.     Review of systems:    In addition to the HPI above,  No Fever-chills, No Headache, No changes with Vision or hearing, No problems swallowing food or Liquids, No Chest pain, Cough or Shortness of Breath, No Abdominal pain, No Nausea or Vommitting, Bowel movements are regular, No Blood in stool or Urine, No dysuria, No new skin rashes or bruises, No new joints pains-aches,  No new weakness, tingling, numbness in any extremity, No recent weight gain or loss, No polyuria, polydypsia or polyphagia, No significant Mental Stressors.  A full 10 point Review of Systems was done, except as stated above, all other Review of Systems were negative.   With Past History of the following :    Past Medical History:  Diagnosis Date  . Aortic valve stenosis, mild    per last echo in  epic dated 10-31-2014  mild to moderate AVS,  valve area 1.46cm^2  . Arthritis   . Heart murmur   . History of GI bleed 04/ 2018;  04-20-2017   04/ 2018  upper GI, normal EGD, x1 unit PRBCs/  04-20-2017  upper GI, hg 5.5, normal EGD, x2 units PRBCs  . History of peptic ulcer   . History of radiation therapy 06/05/16 - 07/25/16   Pelvis (vulvar and bilateral inguinal) treated to 45 Gy in 25 fractions, then Boosted an additional 18 Gy in 9 fractions, vulvar region  . History of rheumatic fever age 77 . Hypertension   . Microcytic anemia   . Neuropathic pain    right goin  . PONV (postoperative nausea and vomiting)    years ago with back surgery-nausea  . Pulmonary nodules    last CT  10-07-2016, benign  .  Recurrent vulvar cancer [facility]) oncologist-- dr rossi/  dr Lurline Idol   dx 01/ 2018  s/p vulvectomy 02-27-2016 , 04-09-2016 & 10-17-2016-- completed external radiation 07-25-2016:  recurrent Stage 3A SCC bilateral vulvar with positive right inguinal node  . Type 2 diabetes mellitus ([place])   . Urinary incontinence due to urethral sphincter incompetence    weakness post vulvar radiation  . Vitamin D deficiency       Past Surgical History:  Procedure Laterality Date  . ABDOMINAL HYSTERECTOMY  1979   W/ BILATERAL SALPINGOOPHORECTOMY  . ANTERIOR CERVICAL DECOMP/DISCECTOMY FUSION  11-09-2002   dr Hal Neer  [facility]   C3--6  . CATARACT EXTRACTION W/ INTRAOCULAR LENS  IMPLANT, BILATERAL  yrs ago  . COLONOSCOPY N/A 05/05/2016   Procedure: COLONOSCOPY;  Surgeon: Daneil Dolin, MD;  [facility];  Service: Endoscopy;  Laterality: N/A;  . DEBRIDEMENT AND CLOSURE WOUND N/A 05/15/2017   Procedure: DEBRIDEMENT VULVA  WOUND;  Surgeon: Everitt Amber, MD;  Location: [facility];  Service: Gynecology;  Laterality: N/A;  . ESOPHAGOGASTRODUODENOSCOPY N/A 05/04/2016   Procedure: ESOPHAGOGASTRODUODENOSCOPY (EGD);  Surgeon: Daneil Dolin, MD;  [facility];  Service: Endoscopy;   Laterality: N/A;  . ESOPHAGOGASTRODUODENOSCOPY N/A 04/21/2017   Procedure: ESOPHAGOGASTRODUODENOSCOPY (EGD);  Surgeon: Ladene Artist, MD;  Location: [facility];  Service: Endoscopy;  Laterality: N/A;  . GIVENS CAPSULE STUDY N/A 05/06/2016   Procedure: GIVENS CAPSULE STUDY;  Surgeon: Daneil Dolin, MD;  [facility];  Service: Endoscopy;  Laterality: N/A;  . KNEE ARTHROSCOPY Left 1980s;   05-18-2002   dr Rhona Raider  [facility]  . LUMBAR SPINE SURGERY  1991    dr Tonita Cong  . RADICAL VULVECTOMY Left 04/09/2016   Procedure: RADICAL LEFT VULVECTOMY;  Surgeon: Everitt Amber, MD;  Location: [facility];  Service: Gynecology;  Laterality: Left;  . RADICAL VULVECTOMY  10/17/2016   Procedure: RADICAL ANTERIOR VULVECTOMY;  Surgeon: Everitt Amber, MD;  Location: [facility];  Service: Gynecology;;  . TOE SURGERY Left   . TONSILLECTOMY  1967  . TOTAL HIP ARTHROPLASTY Left 10/27/2013   Procedure: LEFT TOTAL HIP ARTHROPLASTY;  Surgeon: Tobi Bastos, MD;  Location: [facility];  Service: Orthopedics;  Laterality: Left;  . TOTAL KNEE ARTHROPLASTY Right 03-28-2010    dr Gladstone Lighter  [facility]  . TRANSTHORACIC ECHOCARDIOGRAM  10/31/2014   mild focal basal hypertrophy of the septum, ef 68-12%, grade 1 diastolic dysfunction/  moderate AV stenosis, valve area 1.46cm^2/  trivial MR/  mild PR and TR/  mild RAE/ PASP 12mHg  . VULVA /PERINEUM BIOPSY  02/22/2016  . VULVA /Milagros LollBIOPSY N/A 05/15/2017   Procedure: VULVAR BIOPSY;  Surgeon: REveritt Amber MD;  [facility]  Service: Gynecology;  Laterality: N/A;  . VULVECTOMY Right 02/27/2016   Procedure: RADICAL RIGHT VULVECTOMY WITH RIGHT INGUINAL LYMPH NODE DISSECTION;  Surgeon: PNancy Marus MD;  Location: [facility];  Service: Gynecology;  Laterality: Right;      Social History:     Social History   Tobacco Use  . Smoking status: Never Smoker  . Smokeless tobacco: Never Used  Substance Use Topics  . Alcohol use: No    Alcohol/week: 0.0 oz     Lives - at home  w husband  Mobility - walks    Family History :     Family History  Problem Relation Age of Onset  . Hypertension Mother   . Hypertension Father   . Throat cancer Father   .  Cancer Sister        Breast  . Hypertension Brother        Home Medications:   Prior to Admission medications   Medication Sig Start Date End Date Taking? Authorizing Provider  aspirin EC 81 MG tablet Take 81 mg by mouth daily.   Yes [provider]  dexamethasone (DECADRON) 4 MG tablet Take 3 tabs the night before and 3 tabs the morning of chemotherapy, every 3 weeks, with food 06/03/17  Yes Alvy Bimler, Ni, MD  ferrous sulfate 325 (65 FE) MG tablet Take 1 tablet (325 mg total) by mouth daily with breakfast. 04/11/17  Yes Nilda Simmer, NP  gabapentin (NEURONTIN) 100 MG capsule Take 100 mg by mouth 3 (three) times daily.   Yes [provider]  HYDROcodone-acetaminophen (NORCO) 10-325 MG tablet Take 1 tablet by mouth every 4 (four) hours as needed. 07/02/17  Yes Everitt Amber, MD  losartan (COZAAR) 50 MG tablet TAKE ONE TABLET BY MOUTH ONCE DAILY.--- takes in am 06/30/17  Yes Mikey Kirschner, MD  metFORMIN (GLUCOPHAGE) 500 MG tablet TAKE (1) TABLET BY MOUTH TWICE DAILY. 04/15/17  Yes Mikey Kirschner, MD  morphine (MS CONTIN) 30 MG 12 hr tablet Take 1 tablet (30 mg total) by mouth every 12 (twelve) hours. 07/21/17  Yes Cross, Melissa D, NP  ondansetron (ZOFRAN) 8 MG tablet Take 1 tablet (8 mg total) by mouth 2 (two) times daily as needed for refractory nausea / vomiting. Start on day 3 after chemo. 06/03/17  Yes Gorsuch, Ni, MD  oxyCODONE-acetaminophen (PERCOCET) 10-325 MG tablet Take 1 tablet by mouth every 4 (four) hours as needed for pain. 05/15/17  Yes Everitt Amber, MD  prochlorperazine (COMPAZINE) 10 MG tablet Take 1 tablet (10 mg total) by mouth every 6 (six) hours as needed (Nausea or vomiting). 06/03/17  Yes Heath Lark, MD  blood glucose meter kit and supplies KIT Dispense based on patient and  insurance preference. Use up to three times daily as directed (FOR ICD-10 E11.9). 06/03/17   Mikey Kirschner, MD  collagenase (SANTYL) ointment Apply 1 application topically daily. Apply to vulva wound bed once daily with dressing changes Patient not taking: Reported on 08/03/2017 06/18/17   Joylene John D, NP  gabapentin (NEURONTIN) 300 MG capsule Take 1 capsule (300 mg total) by mouth 3 (three) times daily. Patient not taking: Reported on 08/03/2017 04/11/17   Nilda Simmer, NP  ondansetron (ZOFRAN) 8 MG tablet Take 1 tablet (8 mg total) by mouth every 8 (eight) hours as needed for nausea. Patient not taking: Reported on 08/03/2017 06/03/17   Heath Lark, MD  potassium chloride SA (K-DUR,KLOR-CON) 20 MEQ tablet TAKE 1 TABLET BY MOUTH ONCE A DAY. Patient not taking: Reported on 08/03/2017 11/29/16   Mikey Kirschner, MD  prochlorperazine (COMPAZINE) 10 MG tablet Take 1 tablet (10 mg total) by mouth every 6 (six) hours as needed for nausea or vomiting. Patient not taking: Reported on 08/03/2017 06/03/17   Heath Lark, MD  sodium chloride flush 0.9 % SOLN injection Inject 10 mLs into the vein daily. 07/14/17   Heath Lark, MD     Allergies:     Allergies  Allergen Reactions  . Oxycodone Other (See Comments)    Intense H/A  . Celebrex [Celecoxib] Other (See Comments)    GI upset   . Doxycycline Nausea Only    Gi upset Pt is able to tolerate with food  . Iron Other (See Comments)  Pt cannot tolerate some iron preparations. The upset her stomach.  . Lodine [Etodolac] Nausea Only    Gi upset  . Nabumetone Other (See Comments)    (relafen)  Gi upset  . Tetracycline Nausea And Vomiting  . Xarelto [Rivaroxaban] Hives     Physical Exam:   Vitals  Blood pressure 118/69, pulse (!) 105, temperature 98.7 F (37.1 C), temperature source Oral, resp. rate 17, SpO2 99 %.   1. General  lying in bed in NAD,    2. Normal affect and insight, Not Suicidal or Homicidal, Awake Alert, Oriented  X 3.  3. No F.N deficits, ALL C.Nerves Intact, Strength 5/5 all 4 extremities, Sensation intact all 4 extremities, Plantars down going.  4. Ears and Eyes appear Normal, Conjunctivae clear, PERRLA. Moist Oral Mucosa.  5. Supple Neck, No JVD, No cervical lymphadenopathy appriciated, No Carotid Bruits.  6. Symmetrical Chest wall movement, Good air movement bilaterally, CTAB.  7. RRR, s1, s2, 2/6 sem rusb   8. Positive Bowel Sounds, Abdomen Soft, No tenderness, No organomegaly appriciated,No rebound -guarding or rigidity.  9.  No Cyanosis, Normal Skin Turgor, No Skin Rash or Bruise.  10. Good muscle tone,  joints appear normal , no effusions, Normal ROM.  11. No Palpable Lymph Nodes in Neck or Axillae      Data Review:    CBC Recent Labs  Lab 08/03/17 0106  WBC 3.7*  HGB 8.2*  HCT 25.0*  PLT 65*  MCV 86.5  MCH 28.4  MCHC 32.8  RDW 17.0*  LYMPHSABS 0.3*  MONOABS 1.0  EOSABS 0.0  BASOSABS 0.0   ------------------------------------------------------------------------------------------------------------------  Chemistries  Recent Labs  Lab 08/03/17 0106  NA 138  K 3.7  CL 101  CO2 27  GLUCOSE 117*  BUN 19  CREATININE 0.87  CALCIUM 9.6  AST 12*  ALT 12  ALKPHOS 70  BILITOT 0.9   ------------------------------------------------------------------------------------------------------------------ estimated creatinine clearance is 55.4 mL/min (by C-G formula based on SCr of 0.87 mg/dL). ------------------------------------------------------------------------------------------------------------------ No results for input(s): TSH, T4TOTAL, T3FREE, THYROIDAB in the last 72 hours.  Invalid input(s): FREET3  Coagulation profile No results for input(s): INR, PROTIME in the last 168 hours. ------------------------------------------------------------------------------------------------------------------- No results for input(s): DDIMER in the last 72  hours. -------------------------------------------------------------------------------------------------------------------  Cardiac Enzymes No results for input(s): CKMB, TROPONINI, MYOGLOBIN in the last 168 hours.  Invalid input(s): CK ------------------------------------------------------------------------------------------------------------------ No results found for: BNP   ---------------------------------------------------------------------------------------------------------------  Urinalysis    Component Value Date/Time   COLORURINE YELLOW 05/03/2016 1316   APPEARANCEUR HAZY (A) 05/03/2016 1316   LABSPEC 1.025 05/03/2016 1316   PHURINE 5.0 05/03/2016 1316   GLUCOSEU NEGATIVE 05/03/2016 1316   HGBUR NEGATIVE 05/03/2016 1316   BILIRUBINUR NEGATIVE 05/03/2016 1316   KETONESUR NEGATIVE 05/03/2016 1316   PROTEINUR 30 (A) 05/03/2016 1316   UROBILINOGEN 0.2 10/21/2013 1124   NITRITE NEGATIVE 05/03/2016 1316   LEUKOCYTESUR LARGE (A) 05/03/2016 1316    ----------------------------------------------------------------------------------------------------------------   Imaging Results:    Ct Pelvis W Contrast  Result Date: 08/03/2017 CLINICAL DATA:  77 year old female with malignant neoplasm of the vulva. EXAM: CT PELVIS WITH CONTRAST TECHNIQUE: Multidetector CT imaging of the pelvis was performed using the standard protocol following the bolus administration of intravenous contrast. CONTRAST:  51m OMNIPAQUE IOHEXOL 300 MG/ML  SOLN COMPARISON:  CT of the abdomen pelvis dated 04/30/2017 FINDINGS: Urinary Tract: The visualized distal ureters and urinary bladder appear unremarkable. Bowel: There is sigmoid diverticulosis without active inflammatory changes. No dilated bowel in the pelvis. Vascular/Lymphatic: Mild  atherosclerotic disease.  No adenopathy. Reproductive: Hysterectomy. There is a 3.6 x 2.9 cm nodular density in the skin and subcutaneous tissues of the right vulvar region.  There is irregularity of the overlying skin which may represent ulceration. Multiple additional nodules noted in the volar region and extending into the perineal subcutaneous soft tissues and adjacent to the vaginal orifice and anterior to the rectum. For example there is a 2.6 x 1.2 cm nodular lesion over the left vulvar area and a 12 x 10 mm nodular lesion in the left perineum (series 4 image 112). There has been interval increase in the size of the vulvar lesion and development of new vulvar and perineal lesions compared to the prior CT. Other:  None Musculoskeletal: Total left hip arthroplasty. Osteopenia and degenerative changes of the spine. No acute osseous pathology. IMPRESSION: 1. Interval increase in the size and extent of vulvar mass with development of new vulvar and perineal lesions since the prior CT. 2. No evidence of metastatic disease within the pelvis. Electronically Signed   By: Anner Crete M.D.   On: 08/03/2017 04:28       Assessment & Plan:    Principal Problem:   Vulvar pain Active Problems:   Pancytopenia (HCC)    Vulvar pain  Cont ms contin 12m po bid Dilaudid 0.576miv q4h prn   ? Vulvar infection Wound culture unasyn iv pharmacy to dose  Hypotension Improved with hydration Hold bp medication  Metastatic vulvar squamous cell carcinoma Consider oncology consult in AM CT scan showed progression of disease  Pancytopenia Cont iron supplementation  Dm2 Cont Metformin  fsbs ac and qhs, ISS  Aortic stenosis (moderate)  DVT Prophylaxis Lovenox - SCDs  AM Labs Ordered, also please review Full Orders  Family Communication: Admission, patients condition and plan of care including tests being ordered have been discussed with the patient  who indicate understanding and agree with the plan and Code Status.  Code Status FULL CODE  Likely DC to  home  Condition GUARDED   Consults called: none, consider calling oncology in AM  Admission status:  observation  Time spent in minutes : 60   JaJani Gravel.D on 08/03/2017 at 5:56 AM  Between 7am to 7pm - Pager - [phone]. After 7pm go to [email or web address] - password [email or web address] - Office  [phone]

## 2017-08-04 ENCOUNTER — Telehealth: Payer: Self-pay | Admitting: Oncology

## 2017-08-04 ENCOUNTER — Other Ambulatory Visit: Payer: Self-pay

## 2017-08-04 ENCOUNTER — Other Ambulatory Visit: Payer: Self-pay | Admitting: Hematology and Oncology

## 2017-08-04 ENCOUNTER — Encounter: Payer: Self-pay | Admitting: Oncology

## 2017-08-04 DIAGNOSIS — Z9841 Cataract extraction status, right eye: Secondary | ICD-10-CM | POA: Diagnosis not present

## 2017-08-04 DIAGNOSIS — Z79899 Other long term (current) drug therapy: Secondary | ICD-10-CM | POA: Diagnosis not present

## 2017-08-04 DIAGNOSIS — E1140 Type 2 diabetes mellitus with diabetic neuropathy, unspecified: Secondary | ICD-10-CM | POA: Diagnosis present

## 2017-08-04 DIAGNOSIS — E43 Unspecified severe protein-calorie malnutrition: Secondary | ICD-10-CM | POA: Diagnosis present

## 2017-08-04 DIAGNOSIS — D61810 Antineoplastic chemotherapy induced pancytopenia: Secondary | ICD-10-CM | POA: Diagnosis present

## 2017-08-04 DIAGNOSIS — N766 Ulceration of vulva: Secondary | ICD-10-CM | POA: Diagnosis present

## 2017-08-04 DIAGNOSIS — Z886 Allergy status to analgesic agent status: Secondary | ICD-10-CM | POA: Diagnosis not present

## 2017-08-04 DIAGNOSIS — T451X5A Adverse effect of antineoplastic and immunosuppressive drugs, initial encounter: Secondary | ICD-10-CM

## 2017-08-04 DIAGNOSIS — Z6830 Body mass index (BMI) 30.0-30.9, adult: Secondary | ICD-10-CM

## 2017-08-04 DIAGNOSIS — Z7952 Long term (current) use of systemic steroids: Secondary | ICD-10-CM | POA: Diagnosis not present

## 2017-08-04 DIAGNOSIS — D6481 Anemia due to antineoplastic chemotherapy: Secondary | ICD-10-CM | POA: Diagnosis present

## 2017-08-04 DIAGNOSIS — D509 Iron deficiency anemia, unspecified: Secondary | ICD-10-CM

## 2017-08-04 DIAGNOSIS — L089 Local infection of the skin and subcutaneous tissue, unspecified: Secondary | ICD-10-CM

## 2017-08-04 DIAGNOSIS — I350 Nonrheumatic aortic (valve) stenosis: Secondary | ICD-10-CM | POA: Diagnosis present

## 2017-08-04 DIAGNOSIS — Z515 Encounter for palliative care: Secondary | ICD-10-CM | POA: Diagnosis not present

## 2017-08-04 DIAGNOSIS — Z7189 Other specified counseling: Secondary | ICD-10-CM | POA: Diagnosis not present

## 2017-08-04 DIAGNOSIS — E440 Moderate protein-calorie malnutrition: Secondary | ICD-10-CM

## 2017-08-04 DIAGNOSIS — Z7982 Long term (current) use of aspirin: Secondary | ICD-10-CM | POA: Diagnosis not present

## 2017-08-04 DIAGNOSIS — Z7984 Long term (current) use of oral hypoglycemic drugs: Secondary | ICD-10-CM | POA: Diagnosis not present

## 2017-08-04 DIAGNOSIS — Z90710 Acquired absence of both cervix and uterus: Secondary | ICD-10-CM | POA: Diagnosis not present

## 2017-08-04 DIAGNOSIS — L98492 Non-pressure chronic ulcer of skin of other sites with fat layer exposed: Secondary | ICD-10-CM

## 2017-08-04 DIAGNOSIS — Z961 Presence of intraocular lens: Secondary | ICD-10-CM | POA: Diagnosis present

## 2017-08-04 DIAGNOSIS — E878 Other disorders of electrolyte and fluid balance, not elsewhere classified: Secondary | ICD-10-CM

## 2017-08-04 DIAGNOSIS — R102 Pelvic and perineal pain: Secondary | ICD-10-CM | POA: Diagnosis not present

## 2017-08-04 DIAGNOSIS — Z96643 Presence of artificial hip joint, bilateral: Secondary | ICD-10-CM | POA: Diagnosis present

## 2017-08-04 DIAGNOSIS — D61818 Other pancytopenia: Secondary | ICD-10-CM | POA: Diagnosis present

## 2017-08-04 DIAGNOSIS — D696 Thrombocytopenia, unspecified: Secondary | ICD-10-CM

## 2017-08-04 DIAGNOSIS — D72819 Decreased white blood cell count, unspecified: Secondary | ICD-10-CM

## 2017-08-04 DIAGNOSIS — G893 Neoplasm related pain (acute) (chronic): Secondary | ICD-10-CM | POA: Diagnosis present

## 2017-08-04 DIAGNOSIS — K922 Gastrointestinal hemorrhage, unspecified: Secondary | ICD-10-CM | POA: Diagnosis present

## 2017-08-04 DIAGNOSIS — I959 Hypotension, unspecified: Secondary | ICD-10-CM | POA: Diagnosis present

## 2017-08-04 DIAGNOSIS — C519 Malignant neoplasm of vulva, unspecified: Secondary | ICD-10-CM | POA: Diagnosis present

## 2017-08-04 DIAGNOSIS — E876 Hypokalemia: Secondary | ICD-10-CM | POA: Diagnosis present

## 2017-08-04 DIAGNOSIS — Z9842 Cataract extraction status, left eye: Secondary | ICD-10-CM | POA: Diagnosis not present

## 2017-08-04 LAB — COMPREHENSIVE METABOLIC PANEL
ALBUMIN: 2.7 g/dL — AB (ref 3.5–5.0)
ALT: 10 U/L (ref 0–44)
ANION GAP: 7 (ref 5–15)
AST: 10 U/L — ABNORMAL LOW (ref 15–41)
Alkaline Phosphatase: 56 U/L (ref 38–126)
BILIRUBIN TOTAL: 0.4 mg/dL (ref 0.3–1.2)
BUN: 15 mg/dL (ref 8–23)
CHLORIDE: 107 mmol/L (ref 98–111)
CO2: 25 mmol/L (ref 22–32)
Calcium: 8.5 mg/dL — ABNORMAL LOW (ref 8.9–10.3)
Creatinine, Ser: 0.7 mg/dL (ref 0.44–1.00)
GFR calc Af Amer: >60 mL/min (ref >60)
GFR calc non Af Amer: >60 mL/min (ref >60)
GLUCOSE: 97 mg/dL (ref 70–99)
POTASSIUM: 3.2 mmol/L — AB (ref 3.5–5.1)
SODIUM: 139 mmol/L (ref 135–145)
TOTAL PROTEIN: 5.4 g/dL — AB (ref 6.5–8.1)

## 2017-08-04 LAB — CBC
HCT: 22.3 % — ABNORMAL LOW (ref 36.0–46.0)
HEMATOCRIT: 22.8 % — AB (ref 36.0–46.0)
Hemoglobin: 7.2 g/dL — ABNORMAL LOW (ref 12.0–15.0)
Hemoglobin: 7.3 g/dL — ABNORMAL LOW (ref 12.0–15.0)
MCH: 28.1 pg (ref 26.0–34.0)
MCH: 29.1 pg (ref 26.0–34.0)
MCHC: 31.6 g/dL (ref 30.0–36.0)
MCHC: 32.7 g/dL (ref 30.0–36.0)
MCV: 89.1 fL (ref 78.0–100.0)
MCV: 88.8 fL (ref 78.0–100.0)
PLATELETS: 41 10*3/uL — AB (ref 150–400)
Platelets: 47 10*3/uL — ABNORMAL LOW (ref 150–400)
RBC: 2.56 MIL/uL — ABNORMAL LOW (ref 3.87–5.11)
RBC: 2.51 MIL/uL — AB (ref 3.87–5.11)
RDW: 17.5 % — AB (ref 11.5–15.5)
RDW: 17.5 % — AB (ref 11.5–15.5)
WBC: 3 10*3/uL — ABNORMAL LOW (ref 4.0–10.5)
WBC: 3.6 10*3/uL — AB (ref 4.0–10.5)

## 2017-08-04 LAB — GLUCOSE, CAPILLARY
GLUCOSE-CAPILLARY: 97 mg/dL (ref 70–99)
GLUCOSE-CAPILLARY: 100 mg/dL — AB (ref 70–99)
GLUCOSE-CAPILLARY: 93 mg/dL (ref 70–99)
Glucose-Capillary: 96 mg/dL (ref 70–99)
Glucose-Capillary: 93 mg/dL (ref 70–99)

## 2017-08-04 LAB — SAVE SMEAR

## 2017-08-04 LAB — PREPARE RBC (CROSSMATCH)

## 2017-08-04 MED ORDER — MAGNESIUM SULFATE 2 GM/50ML IV SOLN
2.0000 g | Freq: Once | INTRAVENOUS | Status: AC
  Administered 2017-08-04: 2 g via INTRAVENOUS
  Filled 2017-08-04: qty 50

## 2017-08-04 MED ORDER — POTASSIUM CHLORIDE CRYS ER 20 MEQ PO TBCR
40.0000 meq | EXTENDED_RELEASE_TABLET | Freq: Once | ORAL | Status: AC
  Administered 2017-08-04: 40 meq via ORAL
  Filled 2017-08-04: qty 2

## 2017-08-04 MED ORDER — DIPHENHYDRAMINE HCL 25 MG PO CAPS
25.0000 mg | ORAL_CAPSULE | Freq: Once | ORAL | Status: AC
  Administered 2017-08-04: 25 mg via ORAL
  Filled 2017-08-04: qty 1

## 2017-08-04 MED ORDER — POLYETHYLENE GLYCOL 3350 17 G PO PACK
17.0000 g | PACK | Freq: Every day | ORAL | Status: DC
  Administered 2017-08-07 – 2017-08-10 (×4): 17 g via ORAL
  Filled 2017-08-04 (×5): qty 1

## 2017-08-04 MED ORDER — FUROSEMIDE 10 MG/ML IJ SOLN
20.0000 mg | Freq: Once | INTRAMUSCULAR | Status: AC
  Administered 2017-08-04: 20 mg via INTRAVENOUS
  Filled 2017-08-04: qty 2

## 2017-08-04 MED ORDER — HYDROCODONE-ACETAMINOPHEN 10-325 MG PO TABS
1.0000 | ORAL_TABLET | ORAL | Status: DC | PRN
  Administered 2017-08-04 – 2017-08-07 (×7): 2 via ORAL
  Administered 2017-08-07 – 2017-08-08 (×2): 1 via ORAL
  Administered 2017-08-08 (×2): 2 via ORAL
  Administered 2017-08-09: 1 via ORAL
  Administered 2017-08-09 – 2017-08-10 (×3): 2 via ORAL
  Filled 2017-08-04: qty 1
  Filled 2017-08-04 (×2): qty 2
  Filled 2017-08-04: qty 1
  Filled 2017-08-04 (×8): qty 2
  Filled 2017-08-04: qty 1
  Filled 2017-08-04 (×2): qty 2

## 2017-08-04 MED ORDER — ACETAMINOPHEN 325 MG PO TABS
650.0000 mg | ORAL_TABLET | Freq: Once | ORAL | Status: AC
  Administered 2017-08-04: 650 mg via ORAL
  Filled 2017-08-04: qty 2

## 2017-08-04 MED ORDER — SODIUM CHLORIDE 0.9% IV SOLUTION
Freq: Once | INTRAVENOUS | Status: AC
  Administered 2017-08-04: 17:00:00 via INTRAVENOUS

## 2017-08-04 NOTE — Progress Notes (Signed)
Patient resting in bed in no acute distress.  Stating her pain is now at a 2 on 10 point scale.  She states at home she was watching the clock in order to take her pain medications but now she doesn't even know when her last medication was (8:30am per RN).  "It will let you know that it is there, but nothing like it was."  Urinating without difficulty per patient and having small amount of loose stool when using the bathroom.  Was taking stool softeners at home.  Reporting drainage from the vulvar wound but states it is uncomfortable to have a pad rubbing the area when out of bed.  Tolerating diet with no nausea.  States she has not eaten a large amount due to the lack of seasoning in the food.  Reviewed with Dr. Precious Haws, GYN Oncologist, who will discuss plan of care with Dr. Alvy Bimler.  Surgical intervention is not an option at this time per Dr. Gerarda Fraction.  No needs or concerns voiced per patient at end of visit.

## 2017-08-04 NOTE — Progress Notes (Signed)
PROGRESS NOTE  [name] ZDG:[id number] DOB: 02/23/1940 DOA: 08/02/2017 PCP: Mikey Kirschner, MD  HPI/Recap of past 24 hours: HelenBrattonis a77 y.o.female,w vulvar cancer, dm2, aortic stenosis, h/o GI bleeding, apparently c/o vulvar pain uncontrolled with current medication (ms contin 30mg  po bid). Pt thus presented to ED.  Admitted for vulvar pain and suspected infection.  08/04/2017: Patient seen and examined at bedside.  She continues to have significant pain in her right vulva.  Gynecology oncology following.  Highly appreciated.   Assessment/Plan: Principal Problem:   Vulvar pain Active Problems:   Pancytopenia (HCC)  Chronic right vulvar lesion Purulent drainage Continue IV Unasyn Followed by Dr. Alvy Bimler for vulvar lesion CT pelvis with contrast revealed progression of disease involving bilateral vulva Body fluid culture pending Fluid Gram stain revealed abundant gram-negative rods, abundant gram-positive cocci, abundant gram-positive rods Gynecology oncology following.  Highly appreciated Pain management in place Bowel regimen in place  Vulvar squamous cell carcinoma Radiation therapy about a year ago Gynecology oncology following  Hypokalemia Potassium 3.2 Repleted with p.o. KCl and IV magnesium 2 g once  Pancytopenia Possibly related to her cancer WBC 3.0 from 3.7 yesterday Hemoglobin 7.2 from 8.2 yesterday Platelet 47 from 65 yesterday No sign of overt bleeding Repeat CBC this afternoon  Thrombocytopenia Platelet level on 07/25/2017 was normal at 183 Platelet 47 from 65 yesterday No sign of overt bleeding Defer to oncology  Iron deficiency anemia Continues ferrous sulfate Hemoglobin 7.2 Defer to heme oncology to transfuse  Type 2 diabetes, well controlled Continue metformin And insulin sliding scale   Code Status: Full code  Family Communication: None at bedside  Disposition Plan: Home when oncology signs of and pain is well  controlled on oral medications.   Consultants:  Gynecology oncology  Procedures:  None  Antimicrobials:  IV Unasyn  DVT prophylaxis: SCDs   Objective: Vitals:   08/03/17 1829 08/03/17 2127 08/04/17 0401 08/04/17 0403  BP:  (!) 113/59 103/65   Pulse:  97 92   Resp:  20 18   Temp: (!) 97.4 F (36.3 C) 98.6 F (37 C) 98.2 F (36.8 C)   TempSrc: Oral Oral Oral   SpO2:  98% 96%   Weight:    79.6 kg (175 lb 7.8 oz)  Height:        Intake/Output Summary (Last 24 hours) at 08/04/2017 1332 Last data filed at 08/04/2017 1125 Gross per 24 hour  Intake 596.67 ml  Output 200 ml  Net 396.67 ml   Filed Weights   08/03/17 0649 08/04/17 0403  Weight: 77.6 kg (171 lb 1.2 oz) 79.6 kg (175 lb 7.8 oz)    Exam:  . General: 77 y.o. year-old female well developed well nourished in no acute distress.  Alert and oriented x3. . Cardiovascular: Regular rate and rhythm with no rubs or gallops.  No thyromegaly or JVD noted.   Marland Kitchen Respiratory: Clear to auscultation with no wheezes or rales. Good inspiratory effort. . Abdomen: Soft nontender nondistended with normal bowel sounds x4 quadrants. . Musculoskeletal: No lower extremity edema. 2/4 pulses in all 4 extremities. . Skin: Right vulva wound post radiation with purulent discharge. Marland Kitchen Psychiatry: Mood is appropriate for condition and setting   Data Reviewed: CBC: Recent Labs  Lab 08/03/17 0106 08/04/17 0400  WBC 3.7* 3.0*  NEUTROABS 2.4  --   HGB 8.2* 7.2*  HCT 25.0* 22.8*  MCV 86.5 89.1  PLT 65* 47*   Basic Metabolic Panel: Recent Labs  Lab 08/03/17 0106  08/04/17 0400  NA 138 139  K 3.7 3.2*  CL 101 107  CO2 27 25  GLUCOSE 117* 97  BUN 19 15  CREATININE 0.87 0.70  CALCIUM 9.6 8.5*   GFR: Estimated Creatinine Clearance: 57.9 mL/min (by C-G formula based on SCr of 0.7 mg/dL). Liver Function Tests: Recent Labs  Lab 08/03/17 0106 08/04/17 0400  AST 12* 10*  ALT 12 10  ALKPHOS 70 56  BILITOT 0.9 0.4  PROT 6.2*  5.4*  ALBUMIN 3.3* 2.7*   No results for input(s): LIPASE, AMYLASE in the last 168 hours. No results for input(s): AMMONIA in the last 168 hours. Coagulation Profile: No results for input(s): INR, PROTIME in the last 168 hours. Cardiac Enzymes: No results for input(s): CKTOTAL, CKMB, CKMBINDEX, TROPONINI in the last 168 hours. BNP (last 3 results) No results for input(s): PROBNP in the last 8760 hours. HbA1C: No results for input(s): HGBA1C in the last 72 hours. CBG: Recent Labs  Lab 08/03/17 1137 08/03/17 1731 08/03/17 2131 08/04/17 0734 08/04/17 1139  GLUCAP 99 116* 97 100* 96   Lipid Profile: No results for input(s): CHOL, HDL, LDLCALC, TRIG, CHOLHDL, LDLDIRECT in the last 72 hours. Thyroid Function Tests: No results for input(s): TSH, T4TOTAL, FREET4, T3FREE, THYROIDAB in the last 72 hours. Anemia Panel: No results for input(s): VITAMINB12, FOLATE, FERRITIN, TIBC, IRON, RETICCTPCT in the last 72 hours. Urine analysis:    Component Value Date/Time   COLORURINE YELLOW 05/03/2016 1316   APPEARANCEUR HAZY (A) 05/03/2016 1316   LABSPEC 1.025 05/03/2016 1316   PHURINE 5.0 05/03/2016 1316   GLUCOSEU NEGATIVE 05/03/2016 1316   HGBUR NEGATIVE 05/03/2016 1316   BILIRUBINUR NEGATIVE 05/03/2016 1316   KETONESUR NEGATIVE 05/03/2016 1316   PROTEINUR 30 (A) 05/03/2016 1316   UROBILINOGEN 0.2 10/21/2013 1124   NITRITE NEGATIVE 05/03/2016 1316   LEUKOCYTESUR LARGE (A) 05/03/2016 1316   Sepsis Labs: @LABRCNTIP (procalcitonin:4,lacticidven:4)  ) Recent Results (from the past 240 hour(s))  Wound or Superficial Culture     Status: None (Preliminary result)   Collection Time: 08/03/17  2:02 AM  Result Value Ref Range Status   Specimen Description   Final    VULVA Performed at [facility], [place] [address] [place], [place] [address]    Special Requests   Final    NONE Performed at [facility], [place] [address], [place], [place] [address]      Gram Stain   Final    NO WBC SEEN ABUNDANT GRAM NEGATIVE RODS ABUNDANT GRAM POSITIVE RODS FEW GRAM POSITIVE COCCI IN CLUSTERS Performed at [facility], [place] [address] [place], [place] [address]    Culture PENDING  Incomplete   Report Status PENDING  Incomplete  Aerobic Culture (superficial specimen)     Status: None (Preliminary result)   Collection Time: 08/03/17 12:29 PM  Result Value Ref Range Status   Specimen Description   Final    VULVA Performed at [facility] [address], [place], [place] [address]    Special Requests   Final    Normal Performed at [facility], [place] [address], [place], [place] [address]    Gram Stain   Final    MODERATE WBC PRESENT, PREDOMINANTLY PMN ABUNDANT GRAM NEGATIVE RODS ABUNDANT GRAM POSITIVE COCCI ABUNDANT GRAM POSITIVE RODS Performed at [facility], [place] [address], [place],  [address]    Culture PENDING  Incomplete   Report Status PENDING  Incomplete  Studies: No results found.  Scheduled Meds: . docusate sodium  100 mg Oral BID  . ferrous sulfate  325 mg Oral Q breakfast  . gabapentin  100 mg Oral TID  . insulin aspart  0-5 Units Subcutaneous QHS  . insulin aspart  0-9 Units Subcutaneous TID WC  . metFORMIN  500 mg Oral BID WC  . morphine  30 mg Oral Q12H    Continuous Infusions: . ampicillin-sulbactam (UNASYN) IV 3 g (08/04/17 1300)     LOS: 0 days     Kayleen Memos, MD [facility] Pager [phone]  If 7PM-7AM, please contact night-coverage [email or web address] Password [email or web address] 08/04/2017, 1:32 PM

## 2017-08-04 NOTE — Progress Notes (Signed)
[facility]  Active pt with [facility] prior this hospital admission.  [facility] team will follow pt and support transition home when ordered.  If patient discharges after hours, please call [phone].   Larry Sierras 08/04/2017, 9:57 AM

## 2017-08-04 NOTE — Progress Notes (Signed)
[name]   DOB:04-Oct-1940   WC#:[id number]    Assessment & Plan:  Vulva cancer ([place]) Clinically, her vulval cancer appears to be healing I have reviewed her CT imaging extensively It is not clear to me that the abnormality seen in the nearby left vulva area truly represent cancer progression.  Abnormalities in the adjacent enhancing region on the right upper thigh could be due to tumor extension versus infection I am not disputing reading from radiologist; in the absence of biopsy, I felt that the abnormality seen could represent infection rather than cancer progression For now, I recommend aggressive supportive care I recommend outpatient PET CT scan when she is stable for direct comparison with her old PET CT imaging  Multifactorial anemia She has multifactorial anemia including iron deficiency anemia, active infection and recent side effects from chemotherapy I recommend 2 units of blood transfusion Her wound healing will improve with blood transfusion support  Acute thrombocytopenia Likely due to recent chemotherapy She does not need platelet transfusion unless bleeding or platelet count less than 10,000  Mild leukopenia Currently, she is not neutropenic We will monitor carefully  Diabetes mellitus with diabetic neuropathy, without long-term current use of insulin (HCC) Her diabetes is well controlled with metformin and dietary modification She will continue the same  Cancer associated pain She has recent uncontrolled pain  Her prescription pain medicine was increased to MS Contin 30 mg twice a day She will continue pain management for now  Likely wound infection The labial lesion could represent cancer versus wound infection Overall, she has healthy granulation tissue indicated of tissue regeneration I recommend she continue on intravenous antibiotics for minimum 3 to 5 days to aid in wound healing For now, I recommend normal saline dressing only without other agents  to avoid caustic effect on her wound  Moderate protein calorie malnutrition with electrolyte imbalance Recommend dietitian review.  That could also be a component of hemodilution  Discharge planning She is not ready to be discharged anytime soon The risk of premature discharge would be sepsis/death I will continue to reassess daily  Heath Lark, MD 08/04/2017  2:51 PM  . Subjective:  This patient is well-known to me. I was informed of her admission.  The patient presented to the emergency room after presenting with severe pain.  Since admission, she has received broad-spectrum IV antibiotics and intermittent doses of pain medicine as needed. She felt better since admission. The patient denies any recent signs or symptoms of bleeding such as spontaneous epistaxis, hematuria or hematochezia. She denies cough, chest pain or shortness of breath.  She denies recent constipation or nausea since chemotherapy Summary of oncologic history:   Vulva cancer ([place])   02/22/2016 Pathology Results    Labium, biopsy, right mid - INVASIVE SQUAMOUS CELL CARCINOMA.      02/23/2016 Imaging    Ct abdomen and pelvis Mild asymmetric soft tissue thickening in the right vulvar/perineal region, likely corresponding to the patient's known vulvar cancer, poorly evaluated on CT.  No evidence of metastatic disease.  Status post hysterectomy.      02/27/2016 Pathology Results    1. Lymph nodes, regional resection, right inguinal - METASTATIC CARCINOMA IN 1 OF 6 LYMPH NODES (1/6). 2. Vulva, excision, right - INVASIVE KERATINIZING SQUAMOUS CELL CARCINOMA, MODERATELY DIFFERENTIATED, SPANNING 2.1 CM WITH A DEPTH OF INVASION OF 0.6 CM. - THE SURGICAL RESECTION MARGIN ARE NEGATIVE FOR CARCINOMA. - DIFFUSE LICHEN SCLEROSUS ET ATROPHICUS. - SEE ONCOLOGY TABLE BELOW. Microscopic Comment 2. VULVA  Specimen: Right vulvectomy. Procedure: Resection Lymph node sampling performed: Yes. Tumor site: Right vulva Tumor  focality: Carcinoma is unifocal. Maximum tumor size (cm): 2.1 cm (gross measurement). Histologic type: Keratinizing squmaous cell carcinoma. Grade: Moderately differentiated. Depth of stromal invasion (mm): 6 mm (glass slide measurement) Margins: Negative for carcinoma. Distance of carcinoma from nearest margin: 0.6 cm to the 3 o'clock margin. Lymph - Vascular invasion: Not identified. Lymph nodes: number examined: 6 Number positive: 1 size of metastatic focus: 0.3 cm (glass slide measurement) extracapsular extension: Not identified. TNM code: pT1b, pN1a FIGO Stage (based on pathologic findings, needs clinical correlation): IIIA Comment: Grossly, there is a 2.1 cm sessile papular lesion which on histologic evaluation reveals invasive keratinizing squamous cell carcinoma invading to a depth of 0.6 cm as measured from a glass slide. In addition, there is diffuse lichen sclerosus et atrophicus. The surgical resection margins are negative for invasive caricnoma. Additional studies can be performed upon clinical request.      02/27/2016 Surgery    Surgery: Right radical partial vulvectomy, right inguinal lymph node dissection  Surgeons:  Imagene Gurney A. Alycia Rossetti, MD; Lahoma Crocker, MD   Pathology: Right vulva with resection margin with suture at 12:00. Right inguinal nodes.   Operative findings: 1.5 cm fungating erosive lesion of the right vulva approximately 2 cm from the urethra. Evidence of lichen sclerosis throughout the entire vulvar. No other ulcerative lesion suspicious for vulvar carcinoma. Shotty palpable lymphadenopathy in the right inguinal space.        04/01/2016 Imaging    Ct chest 1. Multiple pulmonary nodules as detailed above, generally in the 4 mm and less average diameter range although with 1 right lower lobe subpleural nodule measuring about 8 mm in diameter. These likely warrant surveillance. 2. Hypodense right thyroid nodule, 1.1 cm in long axis. Consider further  evaluation with thyroid ultrasound. If patient is clinically hyperthyroid, consider nuclear medicine thyroid uptake and scan. 3. Stable hypodense lateral splenic lesion, likely a cyst. 4. Aortic and coronary atherosclerosis.       04/02/2016 Pathology Results    Labium, biopsy, left minora INVASIVE SQUAMOUS CELL CARCINOMA      04/09/2016 Pathology Results    1. Vulva, vulvectomy, left - INVASIVE SQUAMOUS CELL CARCINOMA, NON-KERATINIZING, SPANNING 2.4 CM IN WIDTH X 0.4 CM IN DEPTH. - CARCINOMA IS BROADLY PRESENT AT THE 6 O'CLOCK MARGIN OF SPECIMEN #1. - SEE ONCOLOGY TABLE BELOW. 2. Vulva, excision, lateral margin - SQUAMOUS LINED EPITHELIUM WITH LICHEN SCLEROSUS ET ATROPHICUS. 3. Vulva, excision, medial margin - SQUAMOUS LINED EPITHELIUM WITH LICHEN SCLEROSUS ET ATROPHICUS. Microscopic Comment 1. VULVA Specimen: Left vulva. Procedure: Left vulvectomy with additional lateral and medial margins. Lymph node sampling performed: Not on current case. Tumor site: Left vulva. Tumor focality: Unifocal. Maximum tumor size (cm): 2.4 cm in width (gross measurement) Histologic type: Squamous cell carcinoma, non-keratinizing. Grade: High grade. Depth of stromal invasion (mm): 0.4 cm (4 mm), glass slide measurement. Margins: Invasive carcinoma broadly present at the 6 o'clock margin of specimen #1. Lymph - Vascular invasion: Not identified. Lymph nodes: None examined. TNM code: pT1b, pNX FIGO Stage (based on pathologic findings, needs clinical correlation): IB      04/09/2016 Surgery    Surgery: Partial radical left vulvectomy  Surgeons:  Donaciano Eva, MD  Pathology: 1/ left vulva with marking stitch at 12 o'clock anterior, 2/ lateral (thigh) margin with stitch at true new lateral margin. 3/ medial (vaginal) margin with marking stitch at true new medial margin. Operative findings: 2cm  left mid labial nodular lesion        05/03/2016 Imaging    Ct chest 1.  No evidence of  acute pulmonary embolus. 2. Calcified coronary artery atherosclerosis. Negative visible aorta. 3. Pulmonary atelectasis with no other acute pulmonary process. The small subpleural pulmonary nodules seen by CT last month are stable.       06/05/2016 - 07/25/2016 Radiation Therapy    Radiation treatment dates:   06/05/16 - 07/25/16  Site/dose:   Pelvis treated to 45 Gy in 25 fractions, then vulvar region Boosted an additional 18 Gy in 9 fractions.  Beams/energy:   Pelvis:  IMRT  //  6X                              Boost:  IMRT  //  6X       09/30/2016 Pathology Results    Vulva, biopsy, anterior vulva INVASIVE SQUAMOUS CELL CARCINOMA      10/07/2016 Imaging    Ct chest 1. Stable bilateral pulmonary nodules, likely benign but will require surveillance. Recommend followup noncontrast chest CT in 6-12 months. 2. No mediastinal or hilar mass or adenopathy. 3. Stable coronary artery calcifications.  Aortic Atherosclerosis (ICD10-I70.0).      10/17/2016 Pathology Results    Vulva, vulvectomy, anterior - INVASIVE SQUAMOUS CELL CARCINOMA, SPANNING 2.7 CM AND EXTENDING TO A DEPTH OF 0.3 CM. - RESECTION MARGINS ARE NEGATIVE. - SEE ONCOLOGY TABLE. Microscopic Comment VULVA Specimen: Anterior vulva. Procedure: Partial anterior vulvectomy. Lymph node sampling performed: None. Tumor site: Anterior vulva. Tumor focality: Unifocal. Maximum tumor size (cm): 2.7 cm. Histologic type: Squamous cell carcinoma, non-keratinizing. Grade: G2, moderately differentiated. Depth of stromal invasion (mm): 3 mm. Margins: Negative Distance of carcinoma from nearest margin: Invasive tumor is 1 mm from urethral margin. Lymph - Vascular invasion: Not identified. Lymph nodes: None. TNM code: pT1b, pNX FIGO Stage (based on pathologic findings, needs clinical correlation): IB      10/17/2016 Surgery    Surgery: Partial radical anterior vulvectomy  Surgeons:  Donaciano Eva, MD  Pathology:  anterior vulva with marking stitch at anterior clitoral margin o'clock  Operative findings: 3cm raised vulvar cancer (leukoplakia) at midline and right vulva extending to within 0.5cm of anterior urethral meatus        02/17/2017 Pathology Results    Vulva, biopsy - PARAKERATOSIS AND ACUTE INFLAMMATION. - PAS IS NEGATIVE FOR FUNGAL ORGANISMS. - NO DYSPLASIA OR MALIGNANCY.      05/01/2017 Imaging    Ct abdomen and pelvis New 4 x 3 cm rim enhancing lesion central air bubbles in the right vulvar region, which could represent locally recurrent tumor with necrosis or abscess.  No evidence of metastatic disease within the abdomen or pelvis.      05/15/2017 Pathology Results    1. Labium, biopsy, right posterior labia majora - PARAKERATOSIS. - NO DYSPLASIA OR MALIGNANCY. 2. Vulva, excision, anterior right mons pubis lesion - INVASIVE MODERATE TO POORLY DIFFERENTIATED SQUAMOUS CELL CARCINOMA. - ASSOCIATED ULCER, NECROSIS AND ACUTE INFLAMMATION. - SEE COMMENT. 3. Labium, biopsy, left mid labia majora - POORLY DIFFERENTIATED SQUAMOUS CELL CARCINOMA. - SEE COMMENT. Microscopic Comment 2. The specimen is disrupted hampering assessment of margin orientation and size, however, carcinoma involves almost the entire specimen (thus size estimated to be at least 3.1 cm with depth of 1 cm) and extends to the majority of the presumed lateral and deep margins. There is perineural invasion. 3. The  carcinoma is largely beneath the epidermis and extends to the deep and lateral edges of the biopsy.      05/15/2017 Surgery    Surgery: debridement of anterior vulvar abscess, vulvar biopsies.   Surgeons:  Donaciano Eva, MD  Pathology: right posterior labia majora, anterior mons pubis, left mid labia majora  Operative findings: 6cm cavitating anterior mons pubis indurated lesion. It is approximately 3.5cm from the urethral meatus to inferior aspect of mass. Two indurated 1-2cm nodular areas  (one on the posterior right labia majora and one on the mid left labia majora). No periurethral lesions.        06/02/2017 PET scan    Stable hypermetabolic soft tissue density in right vulva, which may be due to postoperative change from recent excision, although residual malignancy and infection cannot be excluded.  No evidence of local or distant metastatic disease.      06/06/2017 Procedure    Successful right arm power PICC line placement with ultrasound and fluoroscopic guidance. The catheter is ready for use.      06/12/2017 Cancer Staging    Staging form: Vulva, AJCC 8th Edition - Clinical: Stage II (cT2, cN0, cM0) - Signed by Heath Lark, MD on 06/12/2017      06/13/2017 -  Chemotherapy    The patient had carboplatin and Taxol       Objective:  Vitals:   08/04/17 0401 08/04/17 1334  BP: 103/65 (!) 94/59  Pulse: 92 93  Resp: 18   Temp: 98.2 F (36.8 C) 99.4 F (37.4 C)  SpO2: 96% 98%     Intake/Output Summary (Last 24 hours) at 08/04/2017 1451 Last data filed at 08/04/2017 1125 Gross per 24 hour  Intake 596.67 ml  Output 200 ml  Net 396.67 ml    GENERAL:alert, no distress and comfortable. She looks pale SKIN: Examination of her vulvar is performed.  Based on my assessment, it looks better compared to her last appointment in my office.  The area of granulation tissue is improved.  Previously noted erythema near the skin ulcer is improved.  The skin in the adjacent thigh on the right appears somewhat macerated likely due to wound drainage EYES: normal, Conjunctiva are pale and non-injected, sclera clear OROPHARYNX:no exudate, no erythema and lips, buccal mucosa, and tongue normal  NECK: supple, thyroid normal size, non-tender, without nodularity LYMPH:  no palpable lymphadenopathy in the cervical, axillary or inguinal LUNGS: clear to auscultation and percussion with normal breathing effort HEART: regular rate & rhythm with loud systolic murmur and no lower  extremity edema ABDOMEN:abdomen soft, non-tender and normal bowel sounds Musculoskeletal:no cyanosis of digits and no clubbing  NEURO: alert & oriented x 3 with fluent speech, no focal motor/sensory deficits   Labs:  Lab Results  Component Value Date   WBC 3.0 (L) 08/04/2017   HGB 7.2 (L) 08/04/2017   HCT 22.8 (L) 08/04/2017   MCV 89.1 08/04/2017   PLT 47 (L) 08/04/2017   NEUTROABS 2.4 08/03/2017    Lab Results  Component Value Date   NA 139 08/04/2017   K 3.2 (L) 08/04/2017   CL 107 08/04/2017   CO2 25 08/04/2017    Studies:  Ct Pelvis W Contrast  Addendum Date: 08/04/2017   ADDENDUM REPORT: 08/04/2017 10:07 ADDENDUM: The following is a comparison to PET-CT from 06/02/2017. The ulcerative mass involving the bulb of and extending into the vulva measures 5.7 by 3.4 by 4.0 cm. On the previous exam this measured 3.7 x 2.9 by  3.0 cm. Additionally, there is progressive enhancing tumor which extends posteriorly into the right upper thigh with a maximum thickness of 0.9 cm, image 116/4. 0.7 cm. There is a enlarging enhancing nodule within the subcutaneous soft tissues of the left vulva measuring 1.2 x 1.0 cm. Previously this measured 0.7 cm in maximum diameter. More anteriorly within the left vulva is a enhancing mass measuring 1.2 x 2.6 cm, image 114/4. Previously this measured 1.6 x 1.2 cm. IMPRESSION: Overall there has been progression of disease involving bilateral vulva. On the right this demonstrates progressive anterior involvement, ulceration and extension into the mons pubis. This also demonstrates progressive posterior extension into the medial upper thigh. Electronically Signed   By: Kerby Moors M.D.   On: 08/04/2017 10:07   Result Date: 08/04/2017 CLINICAL DATA:  77 year old female with malignant neoplasm of the vulva. EXAM: CT PELVIS WITH CONTRAST TECHNIQUE: Multidetector CT imaging of the pelvis was performed using the standard protocol following the bolus administration of  intravenous contrast. CONTRAST:  67mL OMNIPAQUE IOHEXOL 300 MG/ML  SOLN COMPARISON:  CT of the abdomen pelvis dated 04/30/2017 FINDINGS: Urinary Tract: The visualized distal ureters and urinary bladder appear unremarkable. Bowel: There is sigmoid diverticulosis without active inflammatory changes. No dilated bowel in the pelvis. Vascular/Lymphatic: Mild atherosclerotic disease.  No adenopathy. Reproductive: Hysterectomy. There is a 3.6 x 2.9 cm nodular density in the skin and subcutaneous tissues of the right vulvar region. There is irregularity of the overlying skin which may represent ulceration. Multiple additional nodules noted in the volar region and extending into the perineal subcutaneous soft tissues and adjacent to the vaginal orifice and anterior to the rectum. For example there is a 2.6 x 1.2 cm nodular lesion over the left vulvar area and a 12 x 10 mm nodular lesion in the left perineum (series 4 image 112). There has been interval increase in the size of the vulvar lesion and development of new vulvar and perineal lesions compared to the prior CT. Other:  None Musculoskeletal: Total left hip arthroplasty. Osteopenia and degenerative changes of the spine. No acute osseous pathology. IMPRESSION: 1. Interval increase in the size and extent of vulvar mass with development of new vulvar and perineal lesions since the prior CT. 2. No evidence of metastatic disease within the pelvis. Electronically Signed: By: Anner Crete M.D. On: 08/03/2017 04:28

## 2017-08-04 NOTE — Consult Note (Addendum)
[facility] Nurse wound consult note Reason for Consult: Consult requested for vulva wound.  Reviewed progress notes and photo in the EMR.  Pt has a history of vulva cancer and radiation. Wound type: Full thickness wound with 100% yellow slough, surrounded by generalized erythremia and induration. Dressing procedure/placement/frequency: This complex medical condition and wound is beyond [facility] scope of practice.  Please consult GYN surgical team for further plan of care.  Topical treatment will not be effective to promote healing.  Goals are directed towards decreasing pain and adherence of dressings with changes.  Apply double-folded xeroform gauze to vulva wound Q day, then cover with ABD pad and mesh underwear. Please re-consult if further assistance is needed.  Thank-you,  Julien Girt MSN, [address], [place], [place], [place]

## 2017-08-04 NOTE — Telephone Encounter (Signed)
OK, will check on her tomorrow

## 2017-08-04 NOTE — Telephone Encounter (Signed)
Patient's son called and said that he took [name] to the ER this weekend for severe burning pain near her incision.  He wants to make sure that Dr. Alvy Bimler and Dr. Denman George are aware of her admission.

## 2017-08-05 LAB — COMPREHENSIVE METABOLIC PANEL
ALBUMIN: 2.3 g/dL — AB (ref 3.5–5.0)
ALT: 7 U/L (ref 0–44)
AST: 9 U/L — ABNORMAL LOW (ref 15–41)
Alkaline Phosphatase: 50 U/L (ref 38–126)
Anion gap: 16 — ABNORMAL HIGH (ref 5–15)
BUN: 12 mg/dL (ref 8–23)
CHLORIDE: 111 mmol/L (ref 98–111)
CO2: 24 mmol/L (ref 22–32)
Calcium: 7.5 mg/dL — ABNORMAL LOW (ref 8.9–10.3)
Creatinine, Ser: 0.99 mg/dL (ref 0.44–1.00)
GFR calc Af Amer: >60 mL/min (ref >60)
GFR, EST NON AFRICAN AMERICAN: 53 mL/min — AB (ref >60)
Glucose, Bld: 94 mg/dL (ref 70–99)
POTASSIUM: 2.9 mmol/L — AB (ref 3.5–5.1)
Sodium: 151 mmol/L — ABNORMAL HIGH (ref 135–145)
Total Bilirubin: 0.9 mg/dL (ref 0.3–1.2)
Total Protein: <3 g/dL — ABNORMAL LOW (ref 6.5–8.1)

## 2017-08-05 LAB — CBC WITH DIFFERENTIAL/PLATELET
BASOS ABS: 0 10*3/uL (ref 0.0–0.1)
BASOS PCT: 0 %
EOS ABS: 0 10*3/uL (ref 0.0–0.7)
EOS PCT: 0 %
HCT: 26.6 % — ABNORMAL LOW (ref 36.0–46.0)
Hemoglobin: 8.8 g/dL — ABNORMAL LOW (ref 12.0–15.0)
Lymphocytes Relative: 11 %
Lymphs Abs: 0.5 10*3/uL — ABNORMAL LOW (ref 0.7–4.0)
MCH: 28.8 pg (ref 26.0–34.0)
MCHC: 33.1 g/dL (ref 30.0–36.0)
MCV: 86.9 fL (ref 78.0–100.0)
MONO ABS: 0.4 10*3/uL (ref 0.1–1.0)
Monocytes Relative: 11 %
Neutro Abs: 3.1 10*3/uL (ref 1.7–7.7)
Neutrophils Relative %: 78 %
PLATELETS: 37 10*3/uL — AB (ref 150–400)
RBC: 3.06 MIL/uL — ABNORMAL LOW (ref 3.87–5.11)
RDW: 16.9 % — AB (ref 11.5–15.5)
WBC: 4 10*3/uL (ref 4.0–10.5)

## 2017-08-05 LAB — GLUCOSE, CAPILLARY
GLUCOSE-CAPILLARY: 93 mg/dL (ref 70–99)
GLUCOSE-CAPILLARY: 96 mg/dL (ref 70–99)
Glucose-Capillary: 97 mg/dL (ref 70–99)

## 2017-08-05 LAB — BASIC METABOLIC PANEL
ANION GAP: 8 (ref 5–15)
BUN: 14 mg/dL (ref 8–23)
CALCIUM: 8.9 mg/dL (ref 8.9–10.3)
CO2: 27 mmol/L (ref 22–32)
Chloride: 107 mmol/L (ref 98–111)
Creatinine, Ser: 0.67 mg/dL (ref 0.44–1.00)
Glucose, Bld: 103 mg/dL — ABNORMAL HIGH (ref 70–99)
Potassium: 3.4 mmol/L — ABNORMAL LOW (ref 3.5–5.1)
SODIUM: 142 mmol/L (ref 135–145)

## 2017-08-05 LAB — AEROBIC CULTURE  (SUPERFICIAL SPECIMEN)

## 2017-08-05 LAB — AEROBIC CULTURE W GRAM STAIN (SUPERFICIAL SPECIMEN)
Gram Stain: NONE SEEN
Special Requests: NORMAL

## 2017-08-05 LAB — MAGNESIUM: Magnesium: 1.4 mg/dL — ABNORMAL LOW (ref 1.7–2.4)

## 2017-08-05 MED ORDER — POTASSIUM CL IN DEXTROSE 5% 20 MEQ/L IV SOLN
20.0000 meq | INTRAVENOUS | Status: DC
  Administered 2017-08-05: 20 meq via INTRAVENOUS
  Filled 2017-08-05 (×2): qty 1000

## 2017-08-05 MED ORDER — POTASSIUM CHLORIDE CRYS ER 20 MEQ PO TBCR
40.0000 meq | EXTENDED_RELEASE_TABLET | Freq: Two times a day (BID) | ORAL | Status: AC
  Administered 2017-08-05 (×2): 40 meq via ORAL
  Filled 2017-08-05 (×2): qty 2

## 2017-08-05 MED ORDER — DEXTROSE IN LACTATED RINGERS 5 % IV SOLN
INTRAVENOUS | Status: DC
  Administered 2017-08-05 – 2017-08-06 (×2): via INTRAVENOUS

## 2017-08-05 MED ORDER — SODIUM CHLORIDE 0.9% IV SOLUTION: Freq: Once | INTRAVENOUS | Status: DC

## 2017-08-05 MED ORDER — SODIUM CHLORIDE 0.9% FLUSH
10.0000 mL | INTRAVENOUS | Status: DC | PRN
  Administered 2017-08-07 – 2017-08-09 (×4): 10 mL
  Filled 2017-08-05 (×4): qty 40

## 2017-08-05 MED ORDER — MAGNESIUM SULFATE 2 GM/50ML IV SOLN
2.0000 g | Freq: Once | INTRAVENOUS | Status: AC
  Administered 2017-08-05: 2 g via INTRAVENOUS
  Filled 2017-08-05: qty 50

## 2017-08-05 NOTE — Consult Note (Signed)
   [facility] CM Inpatient Consult   08/05/2017  DANYLA WATTLEY [birth year] [id number]   Patient screened for potential [facility] Care Management program due to unplanned readmission risk score of 22% (high).   Went to bedside to speak with Mrs. Espiritu about [facility] Management program services. She pleasantly declines. States she lives with her husband and her son takes them to MD appointments. Denies having any concerns with obtaining medications. Denies having any community case management needs at this time. Reports she really does not want a lot of people being involved in her care.  States " I am a private person". Expresses appreciation of visit however.   Provided [facility] Care Management brochure with contact information and 24-hr nurse advice line magnet.  Will make inpatient RNCM aware [facility] Management services were declined.    Marthenia Rolling, MSN-Ed, RN,BSN [facility] Liaison [phone]

## 2017-08-05 NOTE — Progress Notes (Addendum)
PROGRESS NOTE  [name] UEK:[id number] DOB: 1940/09/13 DOA: 08/02/2017 PCP: Mikey Kirschner, MD  HPI/Recap of past 24 hours: [name] a77 y.o.female,w vulvar cancer status post radiation therapy, dm2, aortic stenosis, h/o GI bleeding, apparently c/o vulvar pain uncontrolled with current medication (ms contin 30mg  po bid). Pt thus presented to ED.  Admitted for vulvar pain and suspected vulva infection. CT pelvis with contrast revealed questionable progression of disease involving bilateral vulva with high suspicion for infection.  Gynecology oncology consulted and following.  On IV antibiotics.  08/05/2017: Patient seen and examined at bedside.  She reports her pain is better controlled.  Requests changing diet which was switched to regular.    Assessment/Plan: Principal Problem:   Vulvar pain Active Problems:   Pancytopenia (HCC)  Chronic right vulvar lesion Purulent drainage Continue IV Unasyn Followed by Dr. Alvy Bimler for vulvar lesion CT pelvis with contrast revealed questionable progression of disease involving bilateral vulva Body fluid culture multiple organisms present.  None predominant. Continue IV Unasyn Gynecology oncology following.  Highly appreciated Pain management in place Bowel regimen in place  Vulvar squamous cell carcinoma Radiation therapy about a year ago Gynecology oncology following  Hypokalemia, resolving Potassium 3.4 from 2.9 Repleted with p.o. KCl and IV magnesium Repeat BMP in the morning  Hypomagnesemia Magnesium 1.4 Repleted with 2 g of IV magnesium once  Pancytopenia Suspect related to her cancer  Worsening thrombocytopenia Platelets 37K from 41K No sign of overt bleeding Per gyn oncology transfuse if plt count less than 10 k/ bleeding Repeat CBC in the morning  Chronic normocytic anemia/iron deficiency anemia Continues ferrous sulfate Hemoglobin 8.8 status post 2 unit PRBC transfusion on 08/04/2017 Repeat CBC  in the morning  History of type 2 diabetes Tightly controlled Last A1c 5.2 on 06/23/2017 Hold metformin Avoid hypoglycemia  Severe protein calorie malnutrition Albumin 2.3 Allow for regular diet Increase protein calorie intake Continue oral supplement   Code Status: Full code  Family Communication: None at bedside  Disposition Plan: Home when gyn oncology signs of and pain is well controlled on oral medications.   Consultants:  Gynecology oncology  Procedures:  None  Antimicrobials:  IV Unasyn  DVT prophylaxis: SCDs-chemical DVT prophylaxis contraindicated due to thrombocytopenia   Objective: Vitals:   08/05/17 0048 08/05/17 0125 08/05/17 0340 08/05/17 1317  BP: (!) 153/77 124/65 130/71 123/71  Pulse: 90 83 77 82  Resp: 18 20 14 14   Temp: 97.9 F (36.6 C) 98.6 F (37 C) 98.5 F (36.9 C) 98.5 F (36.9 C)  TempSrc: Oral Oral Oral Oral  SpO2: 100% 97% 97% 99%  Weight:      Height:        Intake/Output Summary (Last 24 hours) at 08/05/2017 2021 Last data filed at 08/05/2017 1928 Gross per 24 hour  Intake 1766.92 ml  Output 603 ml  Net 1163.92 ml   Filed Weights   08/03/17 0649 08/04/17 0403  Weight: 77.6 kg (171 lb 1.2 oz) 79.6 kg (175 lb 7.8 oz)    Exam:  . General: 77 y.o. year-old female well-developed well-nourished in no acute distress.  Alert and oriented x3.   . Cardiovascular: Regular rate and rhythm with no rubs or gallops.  No thyromegaly or JVD noted. Marland Kitchen Respiratory: Clear to auscultation with no wheezes or rales. Good inspiratory effort. . Abdomen: Soft nontender nondistended with normal bowel sounds x4 quadrants. . Musculoskeletal: No lower extremity edema. 2/4 pulses in all 4 extremities. . Skin: Right vulva wound post radiation with  purulent discharge. Marland Kitchen Psychiatry: Mood is appropriate for condition and setting   Data Reviewed: CBC: Recent Labs  Lab 08/03/17 0106 08/04/17 0400 08/04/17 1507 08/05/17 0546  WBC 3.7* 3.0* 3.6*  4.0  NEUTROABS 2.4  --   --  3.1  HGB 8.2* 7.2* 7.3* 8.8*  HCT 25.0* 22.8* 22.3* 26.6*  MCV 86.5 89.1 88.8 86.9  PLT 65* 47* 41* 37*   Basic Metabolic Panel: Recent Labs  Lab 08/03/17 0106 08/04/17 0400 08/05/17 0546 08/05/17 1531  NA 138 139 151* 142  K 3.7 3.2* 2.9* 3.4*  CL 101 107 111 107  CO2 27 25 24 27   GLUCOSE 117* 97 94 103*  BUN 19 15 12 14   CREATININE 0.87 0.70 0.99 0.67  CALCIUM 9.6 8.5* 7.5* 8.9  MG  --   --   --  1.4*   GFR: Estimated Creatinine Clearance: 57.9 mL/min (by C-G formula based on SCr of 0.67 mg/dL). Liver Function Tests: Recent Labs  Lab 08/03/17 0106 08/04/17 0400 08/05/17 0546  AST 12* 10* 9*  ALT 12 10 7   ALKPHOS 70 56 50  BILITOT 0.9 0.4 0.9  PROT 6.2* 5.4* <3.0*  ALBUMIN 3.3* 2.7* 2.3*   No results for input(s): LIPASE, AMYLASE in the last 168 hours. No results for input(s): AMMONIA in the last 168 hours. Coagulation Profile: No results for input(s): INR, PROTIME in the last 168 hours. Cardiac Enzymes: No results for input(s): CKTOTAL, CKMB, CKMBINDEX, TROPONINI in the last 168 hours. BNP (last 3 results) No results for input(s): PROBNP in the last 8760 hours. HbA1C: No results for input(s): HGBA1C in the last 72 hours. CBG: Recent Labs  Lab 08/04/17 1637 08/04/17 2132 08/05/17 0807 08/05/17 1152 08/05/17 1644  GLUCAP 93 93 97 93 96   Lipid Profile: No results for input(s): CHOL, HDL, LDLCALC, TRIG, CHOLHDL, LDLDIRECT in the last 72 hours. Thyroid Function Tests: No results for input(s): TSH, T4TOTAL, FREET4, T3FREE, THYROIDAB in the last 72 hours. Anemia Panel: No results for input(s): VITAMINB12, FOLATE, FERRITIN, TIBC, IRON, RETICCTPCT in the last 72 hours. Urine analysis:    Component Value Date/Time   COLORURINE YELLOW 05/03/2016 1316   APPEARANCEUR HAZY (A) 05/03/2016 1316   LABSPEC 1.025 05/03/2016 1316   PHURINE 5.0 05/03/2016 1316   GLUCOSEU NEGATIVE 05/03/2016 1316   HGBUR NEGATIVE 05/03/2016 1316    BILIRUBINUR NEGATIVE 05/03/2016 1316   KETONESUR NEGATIVE 05/03/2016 1316   PROTEINUR 30 (A) 05/03/2016 1316   UROBILINOGEN 0.2 10/21/2013 1124   NITRITE NEGATIVE 05/03/2016 1316   LEUKOCYTESUR LARGE (A) 05/03/2016 1316   Sepsis Labs: @LABRCNTIP (procalcitonin:4,lacticidven:4)  ) Recent Results (from the past 240 hour(s))  Wound or Superficial Culture     Status: Abnormal   Collection Time: 08/03/17  2:02 AM  Result Value Ref Range Status   Specimen Description   Final    VULVA Performed at [facility] [address] [place], [place] [address]    Special Requests   Final    NONE Performed at [facility], [place] [address] [place], [place] [address]    Gram Stain   Final    NO WBC SEEN ABUNDANT GRAM NEGATIVE RODS ABUNDANT GRAM POSITIVE RODS FEW GRAM POSITIVE COCCI IN CLUSTERS Performed at [facility], [place] [address] [place], [place] [address]    Culture MULTIPLE ORGANISMS PRESENT, NONE PREDOMINANT (A)  Final   Report Status 08/05/2017 FINAL  Final  Aerobic Culture (superficial specimen)     Status:  Abnormal   Collection Time: 08/03/17 12:29 PM  Result Value Ref Range Status   Specimen Description   Final    VULVA Performed at [facility] [address], [place], [place] [address]    Special Requests   Final    Normal Performed at [facility], [place] [address] [place], [place] [address]    Gram Stain   Final    MODERATE WBC PRESENT, PREDOMINANTLY PMN ABUNDANT GRAM NEGATIVE RODS ABUNDANT GRAM POSITIVE COCCI ABUNDANT GRAM POSITIVE RODS Performed at [facility], [place] [address] [place], [place] [address]    Culture MULTIPLE ORGANISMS PRESENT, NONE PREDOMINANT (A)  Final   Report Status 08/05/2017 FINAL  Final      Studies: No results found.  Scheduled Meds: . docusate sodium  100 mg Oral BID  . gabapentin  100 mg Oral TID  . insulin aspart  0-5 Units Subcutaneous QHS    . insulin aspart  0-9 Units Subcutaneous TID WC  . metFORMIN  500 mg Oral BID WC  . morphine  30 mg Oral Q12H  . polyethylene glycol  17 g Oral Daily  . potassium chloride  40 mEq Oral BID    Continuous Infusions: . ampicillin-sulbactam (UNASYN) IV Stopped (08/05/17 1826)  . dextrose 5 % with KCl 20 mEq / L 75 mL/hr at 08/05/17 1849     LOS: 1 day     Kayleen Memos, MD [facility] Pager [phone]  If 7PM-7AM, please contact night-coverage [email or web address] Password [email or web address] 08/05/2017, 8:21 PM

## 2017-08-05 NOTE — Progress Notes (Signed)
Patient states she is doing better.  Pain rated at a 5 at this time.  Stating she hopes to eat more now that her diet has been changed to regular diet.  No concerns voiced.  GYN ONC will continue to follow.

## 2017-08-05 NOTE — Progress Notes (Signed)
[name]   DOB:05-06-1940   OJ#:[id number]    Assessment & Plan:   Vulva cancer ([place]) Clinically, her vulval cancer appears to be healing I have reviewed her CT imaging extensively It is not clear to me that the abnormality seen in the nearby left vulva area truly represent cancer progression.  Abnormalities in the adjacent enhancing region on the right upper thigh could be due to tumor extension versus infection I am not disputing reading from radiologist; in the absence of biopsy, I felt that the abnormality seen could represent infection rather than cancer progression For now, I recommend aggressive supportive care I recommend outpatient PET CT scan when she is stable for direct comparison with her old PET CT imaging I would get her case presented at the next GYN oncology tumor board  Multifactorial anemia She felt better today. She has received 2 units of blood transfusion on 08/04/2017  her wound healing will improve with blood transfusion support I recommend close monitoring of blood count while hospitalized  Acute thrombocytopenia Likely due to recent chemotherapy She does not need platelet transfusion unless bleeding or platelet count less than 10,000 I recommend continue close follow-up of blood count daily  Mild leukopenia Currently, she is not neutropenic We will monitor carefully  Diabetes mellitus with diabetic neuropathy, without long-term current use of insulin (HCC) Her diabetes is well controlled with metformin and dietary modification She will continue the same  Severe protein calorie malnutrition She is placed on heart healthy diet and have poor choices of food I recommend formal dietitian consult for evaluation  Cancer associated pain She has recent uncontrolled pain  Her prescription pain medicine was increased to MS Contin 30 mg twice a day along with breakthrough pain medicine as needed She will continue pain management for now  Likely wound  infection The labial lesion could represent cancer versus wound infection Overall, she has healthy granulation tissue indicated of tissue regeneration I recommend she continue on intravenous antibiotics for minimum 3 to 5 days to aid in wound healing For now, I recommend normal saline dressing only without other agents to avoid caustic effect on her wound  Significant electrolyte imbalance Recommend close monitoring  Discharge planning She is not ready to be discharged anytime soon The risk of premature discharge would be sepsis/death I will continue to reassess daily  Heath Lark, MD 08/05/2017  2:18 PM   Subjective:  She tolerated blood transfusion well.  She had minor accident while in the bathroom recently.  An accidental injury has caused severe pain to the point that she cried recently.  She felt that the prescription of current pain medicine is controlling her pain adequately. The patient denies any recent signs or symptoms of bleeding such as spontaneous epistaxis, hematuria or hematochezia. She complained of significant discharge through the wound. She complained of poor choices of food due to heart healthy diet.  This morning, she ordered toast, generally and peach yogurt.  She was not allow to order sausages that contain protein She denies nausea or constipation  Objective:  Vitals:   08/05/17 0340 08/05/17 1317  BP: 130/71 123/71  Pulse: 77 82  Resp: 14 14  Temp: 98.5 F (36.9 C) 98.5 F (36.9 C)  SpO2: 97% 99%     Intake/Output Summary (Last 24 hours) at 08/05/2017 1418 Last data filed at 08/05/2017 1130 Gross per 24 hour  Intake 2278.67 ml  Output 600 ml  Net 1678.67 ml    GENERAL:alert, no distress and comfortable  SKIN: Her skin color has improved since blood transfusion.  Careful examination of her wound area show healthy granulation tissue with small erythema near the edges of the ulcer.  No signs of bleeding noted ABDOMEN:abdomen soft,  non-tender Musculoskeletal:no cyanosis of digits and no clubbing  NEURO: alert & oriented x 3 with fluent speech, no focal motor/sensory deficits   Labs:  Lab Results  Component Value Date   WBC 4.0 08/05/2017   HGB 8.8 (L) 08/05/2017   HCT 26.6 (L) 08/05/2017   MCV 86.9 08/05/2017   PLT 37 (L) 08/05/2017   NEUTROABS 3.1 08/05/2017    Lab Results  Component Value Date   NA 151 (H) 08/05/2017   K 2.9 (L) 08/05/2017   CL 111 08/05/2017   CO2 24 08/05/2017

## 2017-08-06 ENCOUNTER — Encounter (HOSPITAL_COMMUNITY): Payer: Self-pay

## 2017-08-06 DIAGNOSIS — R102 Pelvic and perineal pain: Secondary | ICD-10-CM

## 2017-08-06 LAB — CBC WITH DIFFERENTIAL/PLATELET
Basophils Absolute: 0 10*3/uL (ref 0.0–0.1)
Basophils Relative: 0 %
EOS PCT: 1 %
Eosinophils Absolute: 0 10*3/uL (ref 0.0–0.7)
HEMATOCRIT: 29.8 % — AB (ref 36.0–46.0)
Hemoglobin: 9.7 g/dL — ABNORMAL LOW (ref 12.0–15.0)
LYMPHS ABS: 0.6 10*3/uL — AB (ref 0.7–4.0)
LYMPHS PCT: 15 %
MCH: 28.6 pg (ref 26.0–34.0)
MCHC: 32.6 g/dL (ref 30.0–36.0)
MCV: 87.9 fL (ref 78.0–100.0)
Monocytes Absolute: 0.4 10*3/uL (ref 0.1–1.0)
Monocytes Relative: 11 %
Neutro Abs: 2.7 10*3/uL (ref 1.7–7.7)
Neutrophils Relative %: 73 %
PLATELETS: 29 10*3/uL — AB (ref 150–400)
RBC: 3.39 MIL/uL — AB (ref 3.87–5.11)
RDW: 17.1 % — ABNORMAL HIGH (ref 11.5–15.5)
WBC: 3.7 10*3/uL — AB (ref 4.0–10.5)

## 2017-08-06 LAB — COMPREHENSIVE METABOLIC PANEL
ALK PHOS: 57 U/L (ref 38–126)
ALT: 9 U/L (ref 0–44)
AST: 11 U/L — AB (ref 15–41)
Albumin: 2.6 g/dL — ABNORMAL LOW (ref 3.5–5.0)
Anion gap: 8 (ref 5–15)
BUN: 10 mg/dL (ref 8–23)
CO2: 27 mmol/L (ref 22–32)
Calcium: 9 mg/dL (ref 8.9–10.3)
Chloride: 107 mmol/L (ref 98–111)
Creatinine, Ser: 0.6 mg/dL (ref 0.44–1.00)
GFR calc Af Amer: >60 mL/min (ref >60)
GLUCOSE: 97 mg/dL (ref 70–99)
POTASSIUM: 4 mmol/L (ref 3.5–5.1)
Sodium: 142 mmol/L (ref 135–145)
Total Bilirubin: 0.4 mg/dL (ref 0.3–1.2)
Total Protein: 5.1 g/dL — ABNORMAL LOW (ref 6.5–8.1)

## 2017-08-06 LAB — TYPE AND SCREEN
ABO/RH(D): B POS
ANTIBODY SCREEN: NEGATIVE
Unit division: 0
Unit division: 0

## 2017-08-06 LAB — MAGNESIUM: Magnesium: 1.8 mg/dL (ref 1.7–2.4)

## 2017-08-06 LAB — GLUCOSE, CAPILLARY
GLUCOSE-CAPILLARY: 96 mg/dL (ref 70–99)
GLUCOSE-CAPILLARY: 89 mg/dL (ref 70–99)
GLUCOSE-CAPILLARY: 100 mg/dL — AB (ref 70–99)
Glucose-Capillary: 107 mg/dL — ABNORMAL HIGH (ref 70–99)
Glucose-Capillary: 93 mg/dL (ref 70–99)

## 2017-08-06 LAB — BPAM RBC
BLOOD PRODUCT EXPIRATION DATE: 201908032359
Blood Product Expiration Date: 201908032359
ISSUE DATE / TIME: 201907151719
ISSUE DATE / TIME: 201907160052
UNIT TYPE AND RH: 7300
Unit Type and Rh: 7300

## 2017-08-06 MED ORDER — PRO-STAT SUGAR FREE PO LIQD
30.0000 mL | Freq: Two times a day (BID) | ORAL | Status: DC
  Administered 2017-08-06 – 2017-08-09 (×5): 30 mL via ORAL
  Filled 2017-08-06 (×6): qty 30

## 2017-08-06 NOTE — Progress Notes (Signed)
[facility] Progress Note  Patient: [name] JYN:[id number]   PCP: Mikey Kirschner, MD DOB: 1940/05/25   DOA: 08/02/2017   DOS: 08/06/2017   Date of Service: the patient was seen and examined on 08/06/2017  Subjective: Patient mentions her pain is well controlled.  No nausea no vomiting.  No fever no chills.  No constipation reported.  Brief hospital course: Pt. with PMH of vulvar cancer S/P radiation and on chemotherapy with carboplatin and Taxol, type II DM, aortic stenosis; admitted on 08/02/2017, presented with complaint of worsening discharge and pain from the vulvar ulcer, was found to have possible vulvar infection. Currently further plan is continue IV antibiotics.  Assessment and Plan: 1.  Vulvar squamous cell carcinoma. Chronic right vulvar lesion. Increasing drainage from the vulvar lesion with vulvar infection. Started on IV Unasyn on admission. CT abdomen and pelvis shows concern for possible disease progression versus infection. Patient follows up with Dr. Hilbert Odor as well as gynecological oncology. Appreciate their assistance in managing this patient. Currently on wound care. Pain is well controlled. On day 3 of IV antibiotics.  Oncology recommended 3 to 5 days of IV antibiotics, will discuss with them regarding potential change to oral. There is also concern for thrombocytopenia, beta-lactam's and cephalosporins are generally associated with thrombocytopenia along with fluoroquinolones. In order to adequately coverage patient may either require to be on clindamycin which is also 1 of the recommended treatment for bacterial vaginosis but need to supplement with strep coverage versus third-generation cephalosporin plus doxycycline plus or minus Flagyl. We will monitor for now.  2.  Chemotherapy-induced pancytopenia. Patient has chronic anemia, now presents with worsening thrombocytopenia. Likely, cytopenia is most likely secondary to chemotherapy although current  worsen secondary to infection and beta-lactam antibiotic. Monitor for now.  3.  Hypokalemia, hypomagnesemia, replacing.  4.  Moderate protein calorie malnutrition. Continue nutrition supplement.  Appreciate dietary consultation.  Diet: regular diet DVT Prophylaxis: subcutaneous Heparin  Advance goals of care discussion: full code  Family Communication: nofamily was present at bedside, at the time of interview.   Disposition:  Discharge to home.  Consultants: Oncology, GYN oncology Procedures: none  Antibiotics: Anti-infectives (From admission, onward)   Start     Dose/Rate Route Frequency Ordered Stop   08/03/17 1200  Ampicillin-Sulbactam (UNASYN) 3 g in sodium chloride 0.9 % 100 mL IVPB     3 g 200 mL/hr over 30 Minutes Intravenous Every 6 hours 08/03/17 0644     08/03/17 0515  Ampicillin-Sulbactam (UNASYN) 3 g in sodium chloride 0.9 % 100 mL IVPB     3 g 200 mL/hr over 30 Minutes Intravenous  Once 08/03/17 0502 08/03/17 0600       Objective: Physical Exam: Vitals:   08/05/17 0340 08/05/17 1317 08/06/17 0453 08/06/17 1413  BP: 130/71 123/71 118/66 121/65  Pulse: 77 82 70 80  Resp: 14 14 12 18   Temp: 98.5 F (36.9 C) 98.5 F (36.9 C) (!) 97.5 F (36.4 C) 98 F (36.7 C)  TempSrc: Oral Oral Oral Oral  SpO2: 97% 99% 98% 96%  Weight:   79.9 kg (176 lb 3.2 oz)   Height:        Intake/Output Summary (Last 24 hours) at 08/06/2017 1752 Last data filed at 08/06/2017 1400 Gross per 24 hour  Intake 2359.58 ml  Output 304 ml  Net 2055.58 ml   Filed Weights   08/03/17 0649 08/04/17 0403 08/06/17 0453  Weight: 77.6 kg (171 lb 1.2 oz) 79.6 kg (175 lb  7.8 oz) 79.9 kg (176 lb 3.2 oz)   General: Alert, Awake and Oriented to Time, Place and Person. Appear in moderate distress, affect appropriate Eyes: PERRL, Conjunctiva normal ENT: Oral Mucosa clear moist. Neck: no JVD, no Abnormal Mass Or lumps Cardiovascular: S1 and S2 Present, aortic systolic Murmur, Peripheral Pulses  Present Respiratory: normal respiratory effort, Bilateral Air entry equal and Decreased, no use of accessory muscle, Clear to Auscultation, no Crackles, no wheezes Abdomen: Bowel Sound present, Soft and no tenderness, no hernia Skin: no redness, no Rash, no induration Extremities: no Pedal edema, no calf tenderness Neurologic: Grossly no focal neuro deficit. Bilaterally Equal motor strength  Data Reviewed: CBC: Recent Labs  Lab 08/03/17 0106 08/04/17 0400 08/04/17 1507 08/05/17 0546 08/06/17 0500  WBC 3.7* 3.0* 3.6* 4.0 3.7*  NEUTROABS 2.4  --   --  3.1 2.7  HGB 8.2* 7.2* 7.3* 8.8* 9.7*  HCT 25.0* 22.8* 22.3* 26.6* 29.8*  MCV 86.5 89.1 88.8 86.9 87.9  PLT 65* 47* 41* 37* 29*   Basic Metabolic Panel: Recent Labs  Lab 08/03/17 0106 08/04/17 0400 08/05/17 0546 08/05/17 1531 08/06/17 0500  NA 138 139 151* 142 142  K 3.7 3.2* 2.9* 3.4* 4.0  CL 101 107 111 107 107  CO2 27 25 24 27 27   GLUCOSE 117* 97 94 103* 97  BUN 19 15 12 14 10   CREATININE 0.87 0.70 0.99 0.67 0.60  CALCIUM 9.6 8.5* 7.5* 8.9 9.0  MG  --   --   --  1.4* 1.8    Liver Function Tests: Recent Labs  Lab 08/03/17 0106 08/04/17 0400 08/05/17 0546 08/06/17 0500  AST 12* 10* 9* 11*  ALT 12 10 7 9   ALKPHOS 70 56 50 57  BILITOT 0.9 0.4 0.9 0.4  PROT 6.2* 5.4* <3.0* 5.1*  ALBUMIN 3.3* 2.7* 2.3* 2.6*   No results for input(s): LIPASE, AMYLASE in the last 168 hours. No results for input(s): AMMONIA in the last 168 hours. Coagulation Profile: No results for input(s): INR, PROTIME in the last 168 hours. Cardiac Enzymes: No results for input(s): CKTOTAL, CKMB, CKMBINDEX, TROPONINI in the last 168 hours. BNP (last 3 results) No results for input(s): PROBNP in the last 8760 hours. CBG: Recent Labs  Lab 08/05/17 1644 08/05/17 2048 08/06/17 0757 08/06/17 1152 08/06/17 1704  GLUCAP 96 96 89 107* 100*   Studies: No results found.  Scheduled Meds: . sodium chloride   Intravenous Once  . docusate  sodium  100 mg Oral BID  . feeding supplement (PRO-STAT SUGAR FREE 64)  30 mL Oral BID  . gabapentin  100 mg Oral TID  . morphine  30 mg Oral Q12H  . polyethylene glycol  17 g Oral Daily   Continuous Infusions: . ampicillin-sulbactam (UNASYN) IV 3 g (08/06/17 1616)  . dextrose 5% lactated ringers 50 mL/hr at 08/05/17 2317   PRN Meds: acetaminophen **OR** acetaminophen, fentaNYL (SUBLIMAZE) injection, HYDROcodone-acetaminophen, ondansetron (ZOFRAN) IV, sodium chloride flush  Time spent: 35 minutes  Author: Berle Mull, MD [facility] Pager: [phone] 08/06/2017 5:52 PM  If 7PM-7AM, please contact night-coverage at [email or web address], password [email or web address]

## 2017-08-06 NOTE — Progress Notes (Signed)
[name]   DOB:19-Nov-1940   JK#:[id number]    Assessment & Plan:  Vulva cancer ([place]) Clinically, her vulval cancer appears to be healing I have reviewed her CT imaging extensively It is not clear to me that the abnormality seen in the nearby leftvulvaarea truly represent cancer progression. Abnormalities in the adjacent enhancing region on the right upper thigh could be due to tumor extension versus infection I am not disputing reading from radiologist;in the absence of biopsy, I felt that the abnormality seen could represent infection rather than cancer progression For now, I recommend aggressive supportive care I recommend outpatient PET CT scan when she is stable for direct comparison with her old PET CT imaging I would get her case presented at the next GYN oncology tumor board I recommend her GYN oncologist to reassess her if possible  Multifactorialanemia She felt better today. She has received 2 units of blood transfusion on 08/04/2017  her wound healing will improve with blood transfusion support I recommend close monitoring of blood count while hospitalized  Acute thrombocytopenia Likely due to recent chemotherapy and side effects of Unasyn She does not need platelet transfusion unless bleeding or platelet count less than10,000 I recommend continue close follow-up of blood count daily Consider changing antibiotics soon  Mild leukopenia Currently, she is not neutropenic We will monitor carefully  Diabetes mellitus with diabetic neuropathy, without long-term current use of insulin (HCC) Her diabetes is well controlled with metformin and dietary modification She will continue the same  Severe protein calorie malnutrition I recommend formal dietitian consult for evaluation  Cancer associated pain She has recent uncontrolled pain  Her prescription pain medicine was increased to MS Contin 30 mg twice a day along with breakthrough pain medicine as needed She  will continue pain management for now  Likely vulva/wound infection The labial lesion could represent cancer versus wound infection Overall, she has healthy granulation tissue indicated oftissueregeneration I recommend she continue on intravenous antibiotics for minimum 3 to 5 days to aid in wound healing Consider changing antibiotics soon if she continues to drop platelets For now, I recommend normal saline dressing only without other agents to avoid caustic effect on her wound  Significant electrolyte imbalance, resolved Recommend close monitoring  Discharge planning She is not ready to be discharged anytime soon The risk of premature discharge would be sepsis/death I will continue to reassess daily  Heath Lark, MD 08/06/2017  7:50 AM   Subjective:  She feels okay today.  She continues to have intermittent pain.  She has profuse discharge near the wound area.  She has been afebrile. The patient denies any recent signs or symptoms of bleeding such as spontaneous epistaxis, hematuria or hematochezia.  Objective:  Vitals:   08/05/17 1317 08/06/17 0453  BP: 123/71 118/66  Pulse: 82 70  Resp: 14 12  Temp: 98.5 F (36.9 C) (!) 97.5 F (36.4 C)  SpO2: 99% 98%     Intake/Output Summary (Last 24 hours) at 08/06/2017 0750 Last data filed at 08/06/2017 0112 Gross per 24 hour  Intake 1310.25 ml  Output 3 ml  Net 1307.25 ml    GENERAL:alert, no distress and comfortable SKIN: Her vulval area is reexamined.  Significant erythema near the wound area with profound discharge and granulation tissue at the base of the ulcer.  It is tender to touch Musculoskeletal:no cyanosis of digits and no clubbing  NEURO: alert & oriented x 3 with fluent speech, no focal motor/sensory deficits   Labs:  Lab  Results  Component Value Date   WBC 3.7 (L) 08/06/2017   HGB 9.7 (L) 08/06/2017   HCT 29.8 (L) 08/06/2017   MCV 87.9 08/06/2017   PLT 29 (LL) 08/06/2017   NEUTROABS 2.7 08/06/2017     Lab Results  Component Value Date   NA 142 08/06/2017   K 4.0 08/06/2017   CL 107 08/06/2017   CO2 27 08/06/2017

## 2017-08-06 NOTE — Progress Notes (Signed)
CRITICAL VALUE ALERT  Critical Value:  Platelets 29  Date & Time Notied:  08/06/17 05:50  Provider Notified: Baltazar Najjar  Orders Received/Actions taken: No new orders at this time

## 2017-08-06 NOTE — Progress Notes (Signed)
GYN Oncology  Subjective: Patient reports having a rough night last night related to pain but feels better this am.  Pain at a 2 at this time.  Tolerating diet and increasing intake after diet changed to regular diet.  Ate hot dog last night and ate over half of sausage patty with toast and 1/3 omelet today.  Having bowel movement everyday.  Voiding without issue.  Denies chest pain, dyspnea.  Stating she needs to have her vulvar wound cleaned.  No other concerns voiced.  Objective: Vital signs in last 24 hours: Temp:  [97.5 F (36.4 C)-98.5 F (36.9 C)] 97.5 F (36.4 C) (07/17 0453) Pulse Rate:  [70-82] 70 (07/17 0453) Resp:  [12-14] 12 (07/17 0453) BP: (118-123)/(66-71) 118/66 (07/17 0453) SpO2:  [98 %-99 %] 98 % (07/17 0453) Weight:  [176 lb 3.2 oz (79.9 kg)] 176 lb 3.2 oz (79.9 kg) (07/17 0453) Last BM Date: 08/05/17  Intake/Output from previous day: 07/16 0701 - 07/17 0700 In: 1696.1 [P.O.:684; I.V.:612.1; IV Piggyback:400] Out: 3 [Urine:2; Stool:1]  Physical Examination: General: alert, cooperative and no distress Resp: clear to auscultation bilaterally Cardio: regular sinus rhythm, murmur present GI: soft, non-tender; bowel sounds normal; no masses,  no organomegaly Extremities: extremities normal, atraumatic, no cyanosis or edema  Vulva: Large crater-like lesion at the anterior mons, dermal infiltration of tumor extending down bilateral labia, no fluctuance, discharge, or drainage.   Photo of vulvar wound under media  Labs: WBC/Hgb/Hct/Plts:  3.7/9.7/29.8/29 (07/17 0500) BUN/Cr/glu/ALT/AST/amyl/lip:  10/0.60/--/9/11/--/-- (07/17 0500)  Assessment: 77 y.o. s/p : stable. Currently admitted as of 08/03/17 for increasing vulvar wound pain. CT pelvis performed resulting:  Interval increase in the size and extent of vulvar mass with development of new vulvar and perineal lesions since the prior CT, No evidence of metastatic disease within the pelvis.  She was started on IV  Unasyn.  Pain:  Pain is well-controlled on PRN medications.  Heme: Hgb 9.7 and Hct 29.8 s/p transfusion. PLT count 29 this am.  ID: IV Unasyn ordered for questionable vulvar wound infection. WBC 3.7 this am.   CV: BP and HR stable.  On Tele.  Murmur noted.  GI:  Tolerating po: Yes.  GU: Reporting adequate output. Creatinine 0.60 this am.   FEN: Cmet this am stable.  Endo: Diabetes mellitus Type II, under good control..  CBG:  CBG (last 3)  Recent Labs    08/05/17 1644 08/05/17 2048 08/06/17 0757  GLUCAP 96 96 89   Prophylaxis: SCDs ordered.    Plan: Patient examined by Dr. Denman George.  Plans to discuss treatment moving forward with Dr. Alvy Bimler.  Continue plan of care per Hospitalist Team and Dr. Alvy Bimler.    LOS: 2 days    Dorothyann Gibbs 08/06/2017, 9:41 AM

## 2017-08-06 NOTE — Progress Notes (Addendum)
Initial Nutrition Assessment  DOCUMENTATION CODES:   Obesity unspecified, Non-severe (moderate) malnutrition in context of chronic illness  INTERVENTION:   -Provide Prostat liquid protein PO 30 ml BID with meals, each supplement provides 100 kcal, 15 grams protein.  NUTRITION DIAGNOSIS:   Moderate Malnutrition related to cancer and cancer related treatments, chronic illness as evidenced by percent weight loss, moderate muscle depletion.  GOAL:   Patient will meet greater than or equal to 90% of their needs  MONITOR:   PO intake, Supplement acceptance, Labs, Weight trends, I & O's, Skin  REASON FOR ASSESSMENT:   Consult Malnutrition Eval  ASSESSMENT:   77 y.o. female, w vulvar cancer, dm2, aortic stenosis, h/o GI bleeding, apparently c/o vulvar pain uncontrolled with current medication   Patient currently eating lunch of roast [place], dressing, yogurt and salad. Pt has almost completed all of her [place] and 1/2 of her dressing at time of visit. Pt states her appetite is better now that she has a better diet and can pick from more options. States she ate a good breakfast of sausage, toast and omelet. Had a hot dog for dinner last night. Pt reports having a decreased appetite for a week PTA. Prior to that she states "she loves to eat" and had no issues. Denies any nausea or troubles with chewing or swallowing since starting cancer treatments.  Pt has not been drinking protein drinks at this time. States she was eating protein bars at times and she likes these. States they contained 15g of protein per bar.  Per chart review, pt has lost 16 lb since 5/14 (8% wt loss x 2 months, significant for time frame). Pt did not seem to think this was d/t her treatments. She states she started cutting back on sweets.   At this time, pt does not meet criteria for severe malnutrition but does meet criteria for moderate malnutrition given depletions and weight loss.  Labs reviewed. Medications:  Colace capsule BID, D5 in lactated ringers at 50 ml/hr -provides 204 kcal  NUTRITION - FOCUSED PHYSICAL EXAM:    Most Recent Value  Orbital Region  No depletion  Upper Arm Region  No depletion  Thoracic and Lumbar Region  Unable to assess  Buccal Region  No depletion  Temple Region  Moderate depletion  Clavicle Bone Region  Mild depletion  Clavicle and Acromion Bone Region  Mild depletion  Scapular Bone Region  Unable to assess  Dorsal Hand  No depletion  Patellar Region  No depletion  Anterior Thigh Region  No depletion  Posterior Calf Region  No depletion  Edema (RD Assessment)  None       Diet Order:   Diet Order           Diet regular Room service appropriate? Yes; Fluid consistency: Thin  Diet effective now          EDUCATION NEEDS:   Education needs have been addressed  Skin:  Skin Assessment: Skin Integrity Issues: Skin Integrity Issues:: Other (Comment) Other: open vulvar wound  Last BM:  7/16  Height:   Ht Readings from Last 1 Encounters:  08/03/17 5\' 3"  (1.6 m)    Weight:   Wt Readings from Last 1 Encounters:  08/06/17 176 lb 3.2 oz (79.9 kg)    Ideal Body Weight:  52.3 kg  BMI:  Body mass index is 31.21 kg/m.  Estimated Nutritional Needs:   Kcal:  1800-2000  Protein:  80-90g  Fluid:  2L/day   Melissa Bibles, MS,  RD, LDN [place] Dietitian Pager: [phone] After Hours Pager: [phone]

## 2017-08-07 ENCOUNTER — Encounter: Payer: Self-pay | Admitting: Oncology

## 2017-08-07 LAB — CBC WITH DIFFERENTIAL/PLATELET
Basophils Absolute: 0 10*3/uL (ref 0.0–0.1)
Basophils Relative: 0 %
EOS ABS: 0 10*3/uL (ref 0.0–0.7)
EOS PCT: 1 %
HCT: 29.8 % — ABNORMAL LOW (ref 36.0–46.0)
Hemoglobin: 9.5 g/dL — ABNORMAL LOW (ref 12.0–15.0)
LYMPHS ABS: 0.6 10*3/uL — AB (ref 0.7–4.0)
Lymphocytes Relative: 16 %
MCH: 28.3 pg (ref 26.0–34.0)
MCHC: 31.9 g/dL (ref 30.0–36.0)
MCV: 88.7 fL (ref 78.0–100.0)
MONO ABS: 0.3 10*3/uL (ref 0.1–1.0)
Monocytes Relative: 8 %
Neutro Abs: 2.8 10*3/uL (ref 1.7–7.7)
Neutrophils Relative %: 75 %
Platelets: 24 10*3/uL — CL (ref 150–400)
RBC: 3.36 MIL/uL — AB (ref 3.87–5.11)
RDW: 17 % — ABNORMAL HIGH (ref 11.5–15.5)
WBC: 3.7 10*3/uL — AB (ref 4.0–10.5)

## 2017-08-07 LAB — COMPREHENSIVE METABOLIC PANEL
ALT: 9 U/L (ref 0–44)
AST: 11 U/L — ABNORMAL LOW (ref 15–41)
Albumin: 2.6 g/dL — ABNORMAL LOW (ref 3.5–5.0)
Alkaline Phosphatase: 53 U/L (ref 38–126)
Anion gap: 7 (ref 5–15)
BUN: 10 mg/dL (ref 8–23)
CHLORIDE: 106 mmol/L (ref 98–111)
CO2: 29 mmol/L (ref 22–32)
CREATININE: 0.63 mg/dL (ref 0.44–1.00)
Calcium: 9 mg/dL (ref 8.9–10.3)
GFR calc non Af Amer: >60 mL/min (ref >60)
Glucose, Bld: 110 mg/dL — ABNORMAL HIGH (ref 70–99)
Potassium: 3.6 mmol/L (ref 3.5–5.1)
SODIUM: 142 mmol/L (ref 135–145)
Total Bilirubin: 0.6 mg/dL (ref 0.3–1.2)
Total Protein: 5.3 g/dL — ABNORMAL LOW (ref 6.5–8.1)

## 2017-08-07 LAB — GLUCOSE, CAPILLARY
GLUCOSE-CAPILLARY: 88 mg/dL (ref 70–99)
GLUCOSE-CAPILLARY: 102 mg/dL — AB (ref 70–99)
Glucose-Capillary: 113 mg/dL — ABNORMAL HIGH (ref 70–99)
Glucose-Capillary: 107 mg/dL — ABNORMAL HIGH (ref 70–99)

## 2017-08-07 LAB — MAGNESIUM: Magnesium: 1.4 mg/dL — ABNORMAL LOW (ref 1.7–2.4)

## 2017-08-07 MED ORDER — MAGNESIUM SULFATE 2 GM/50ML IV SOLN
2.0000 g | Freq: Once | INTRAVENOUS | Status: AC
  Administered 2017-08-07: 2 g via INTRAVENOUS
  Filled 2017-08-07: qty 50

## 2017-08-07 NOTE — Progress Notes (Signed)
[name]   DOB:02/28/1940   GE#:[id number]    Assessment & Plan:   Vulva cancer [facility]) I have reviewed recommendation from GYN oncologist. It was felt that her cancer is progressing I have discussed options with the patient Option #1 would include major surgery including reconstruction surgery. Option #2 would include palliative chemotherapy with single agent chemotherapy.  I estimated response rate in the regional 20% and the intent of treatment is still non-curative Option #3 would include palliative care with hospice The patient is undecided I recommend she talks to family members first I recommend palliative care consult to explain the concept of palliative care/hospice and for symptom management I will be willing to come back for family meeting over the next few days for further discussion about goals of care  Multifactorialanemia She felt better today. She has received 2 units of blood transfusion on 08/04/2017 her wound healing will improve with blood transfusion support I recommend close monitoring of blood count while hospitalized and transfusion as needed  Acute thrombocytopenia Likely due to recent chemotherapy and side effects of Unasyn She does not need platelet transfusion unless bleeding or platelet count less than10,000 I recommend continue close follow-up of blood count daily I have stopped antibiotics treatment   Mild leukopenia Currently, she is not neutropenic We will monitor carefully  Diabetes mellitus with diabetic neuropathy, without long-term current use of insulin (HCC) Her diabetes is well controlled with metformin and dietary modification She will continue the same  Severe protein calorie malnutrition I recommend formal dietitian consult for evaluation  Cancer associated pain She has recent uncontrolled pain  Her prescription pain medicine was increased to MS Contin 30 mg twice a dayalong with breakthrough pain medicine as needed She  will continue pain management for now Recommend palliative care consult for symptom management and medication review  Likely vulva/wound infection She has completed 4 days of intravenous antibiotic therapy We will stop  Significant electrolyte imbalance, resolved Recommend close monitoring  Discharge planning She is not ready to be discharged anytime soon due to progressive pancytopenia Once her blood counts stabilized, she can be discharged I will return again over the next few days to continue to evaluate the patient and discuss goals of care  Heath Lark, MD 08/07/2017  8:11 AM   Subjective:  She feels well.  She denies excessive pain.  Denies nausea vomiting  Objective:  Vitals:   08/06/17 2159 08/07/17 0549  BP: (!) 146/79 (!) 134/91  Pulse: 83 77  Resp: 18 18  Temp: 98 F (36.7 C) 97.6 F (36.4 C)  SpO2: 97% 100%     Intake/Output Summary (Last 24 hours) at 08/07/2017 0811 Last data filed at 08/07/2017 0600 Gross per 24 hour  Intake 3410 ml  Output 502 ml  Net 2908 ml    GENERAL:alert, no distress and comfortable NEURO: alert & oriented x 3 with fluent speech, no focal motor/sensory deficits   Labs:  Lab Results  Component Value Date   WBC 3.7 (L) 08/07/2017   HGB 9.5 (L) 08/07/2017   HCT 29.8 (L) 08/07/2017   MCV 88.7 08/07/2017   PLT 24 (LL) 08/07/2017   NEUTROABS 2.8 08/07/2017    Lab Results  Component Value Date   NA 142 08/07/2017   K 3.6 08/07/2017   CL 106 08/07/2017   CO2 29 08/07/2017    Studies:  No results found.

## 2017-08-07 NOTE — Consult Note (Signed)
Consultation Note Date: 08/08/2017   Patient Name: [name]  DOB: [birth year]  MRN: [id number]  Age / Sex: 77 y.o., female  PCP: Mikey Kirschner, MD Referring Physician: Lavina Hamman, MD  Reason for Consultation: Establishing goals of care and Pain control  HPI/Patient Profile: 77 y.o. female  with past medical history of vulvar cancer s/p radiation and currently on chemo admitted on 08/02/2017 with worsening pain and discharge with concern for possible infection.   Clinical Assessment and Goals of Care: I met this afternoon with [name].    I introduced palliative care as specialized medical care for people living with serious illness. It focuses on providing relief from the symptoms and stress of a serious illness. The goal is to improve quality of life for both the patient and the family.  She reports that her faith and her family are the most important things to her. She is particularly proud of her grandson who is graduating from there [employer] soon.  She reports being appreciative of conversation that Dr. Alvy Bimler had with her this AM regarding plan for cancer care moving forward.  We reviewed options, and [name] reports needing time to consider what she would like to do.  I offered to set up a meeting with her family, and she reports that her husband has difficulty hearing and it would cause him distress to meet and she wants to have "a little time to discuss with my son myself" before we set up anything.  She trusts her care team greatly ("I only come to this hospital because this is where [facility] doctor's are.") and is relying on Dr. Alvy Bimler for guidance moving forward.  She reports not being afraid to die and "if that is where we are, I just need to know it."  We also discussed pain regimen and she report being under good control at this time.  We reviewed medication usage and  discussed the use of long and short acting medications.  Discussed concern that she have adequate pain control and also that we may need to consider rotating to a short acting rescue medication without acetaminophen if the ceiling of acetaminophen is possibly going to limit her pain control.  SUMMARY OF RECOMMENDATIONS   - [name] seems very realistic about her condition.  She reports having good meeting with Dr. Alvy Bimler and understanding her options.  Reports needing time to speak to family and consider.  She declined to set up family meeting at this time, but reports that she is agreeable to the idea at some point.  She values Dr. Alvy Bimler greatly and will continue to rely on her for guidance moving forward. - Will follow up again tomorrow.  Code Status/Advance Care Planning:  Full code- Did not address today as she wants to discuss with family prior to making any changes to her care plan.    Symptom Management:   Pain: Currently well controlled.  I asked her to continue to use medications as needed with plan for  [place] to review overall usage tomorrow and discuss if she may benefit from increase in her long acting agent as well as consider rotation to another rescue opioid if it appears likely that acetaminophen component is going to limit her medication usage.  Palliative Prophylaxis:   Frequent Pain Assessment  Psycho-social/Spiritual:   Desire for further Chaplaincy support:no  Additional Recommendations: Education on Hospice  Prognosis:   Unable to determine  Discharge Planning: To Be Determined      Primary Diagnoses: Present on Admission: . Vulvar pain . Pancytopenia ([place])   I have reviewed the medical record, interviewed the patient and family, and examined the patient. The following aspects are pertinent.  Past Medical History:  Diagnosis Date  . Aortic valve stenosis, mild    per last echo in epic dated 10-31-2014  mild to moderate AVS,  valve area 1.46cm^2  .  Arthritis   . Heart murmur   . History of GI bleed 04/ 2018;  04-20-2017   04/ 2018  upper GI, normal EGD, x1 unit PRBCs/  04-20-2017  upper GI, hg 5.5, normal EGD, x2 units PRBCs  . History of peptic ulcer   . History of radiation therapy 06/05/16 - 07/25/16   Pelvis (vulvar and bilateral inguinal) treated to 45 Gy in 25 fractions, then Boosted an additional 18 Gy in 9 fractions, vulvar region  . History of rheumatic fever age 47  . Hypertension   . Microcytic anemia   . Neuropathic pain    right goin  . PONV (postoperative nausea and vomiting)    years ago with back surgery-nausea  . Pulmonary nodules    last CT  10-07-2016, benign  . Recurrent vulvar cancer [facility]) oncologist-- dr rossi/  dr Lurline Idol   dx 01/ 2018  s/p vulvectomy 02-27-2016 , 04-09-2016 & 10-17-2016-- completed external radiation 07-25-2016:  recurrent Stage 3A SCC bilateral vulvar with positive right inguinal node  . Type 2 diabetes mellitus ([place])   . Urinary incontinence due to urethral sphincter incompetence    weakness post vulvar radiation  . Vitamin D deficiency    Social History   Socioeconomic History  . Marital status: Married    Spouse name: Mortimer Fries  . Number of children: 2  . Years of education: Not on file  . Highest education level: Not on file  Occupational History  . Occupation: Retired Geographical information systems officer  . Financial resource strain: Not on file  . Food insecurity:    Worry: Not on file    Inability: Not on file  . Transportation needs:    Medical: Not on file    Non-medical: Not on file  Tobacco Use  . Smoking status: Never Smoker  . Smokeless tobacco: Never Used  Substance and Sexual Activity  . Alcohol use: No    Alcohol/week: 0.0 oz  . Drug use: No  . Sexual activity: Not on file  Lifestyle  . Physical activity:    Days per week: Not on file    Minutes per session: Not on file  . Stress: Not on file  Relationships  . Social connections:    Talks on phone: Not on file      Gets together: Not on file    Attends religious service: Not on file    Active member of club or organization: Not on file    Attends meetings of clubs or organizations: Not on file    Relationship status: Not on file  Other Topics Concern  .  Not on file  Social History Narrative  . Not on file   Family History  Problem Relation Age of Onset  . Hypertension Mother   . Hypertension Father   . Throat cancer Father   . Cancer Sister        Breast  . Hypertension Brother    Scheduled Meds: . sodium chloride   Intravenous Once  . docusate sodium  100 mg Oral BID  . feeding supplement (PRO-STAT SUGAR FREE 64)  30 mL Oral BID  . gabapentin  100 mg Oral TID  . morphine  30 mg Oral Q12H  . polyethylene glycol  17 g Oral Daily   Continuous Infusions: PRN Meds:.acetaminophen **OR** acetaminophen, fentaNYL (SUBLIMAZE) injection, HYDROcodone-acetaminophen, ondansetron (ZOFRAN) IV, sodium chloride flush Medications Prior to Admission:  Prior to Admission medications   Medication Sig Start Date End Date Taking? Authorizing Provider  aspirin EC 81 MG tablet Take 81 mg by mouth daily.   Yes [provider]  dexamethasone (DECADRON) 4 MG tablet Take 3 tabs the night before and 3 tabs the morning of chemotherapy, every 3 weeks, with food 06/03/17  Yes Alvy Bimler, Ni, MD  ferrous sulfate 325 (65 FE) MG tablet Take 1 tablet (325 mg total) by mouth daily with breakfast. 04/11/17  Yes Nilda Simmer, NP  gabapentin (NEURONTIN) 100 MG capsule Take 100 mg by mouth 3 (three) times daily.   Yes [provider]  HYDROcodone-acetaminophen (NORCO) 10-325 MG tablet Take 1 tablet by mouth every 4 (four) hours as needed. 07/02/17  Yes Everitt Amber, MD  losartan (COZAAR) 50 MG tablet TAKE ONE TABLET BY MOUTH ONCE DAILY.--- takes in am 06/30/17  Yes Mikey Kirschner, MD  metFORMIN (GLUCOPHAGE) 500 MG tablet TAKE (1) TABLET BY MOUTH TWICE DAILY. 04/15/17  Yes Mikey Kirschner, MD  morphine (MS  CONTIN) 30 MG 12 hr tablet Take 1 tablet (30 mg total) by mouth every 12 (twelve) hours. 07/21/17  Yes Cross, Melissa D, NP  ondansetron (ZOFRAN) 8 MG tablet Take 1 tablet (8 mg total) by mouth 2 (two) times daily as needed for refractory nausea / vomiting. Start on day 3 after chemo. 06/03/17  Yes Gorsuch, Ni, MD  oxyCODONE-acetaminophen (PERCOCET) 10-325 MG tablet Take 1 tablet by mouth every 4 (four) hours as needed for pain. 05/15/17  Yes Everitt Amber, MD  prochlorperazine (COMPAZINE) 10 MG tablet Take 1 tablet (10 mg total) by mouth every 6 (six) hours as needed (Nausea or vomiting). 06/03/17  Yes Heath Lark, MD  blood glucose meter kit and supplies KIT Dispense based on patient and insurance preference. Use up to three times daily as directed (FOR ICD-10 E11.9). 06/03/17   Mikey Kirschner, MD  collagenase (SANTYL) ointment Apply 1 application topically daily. Apply to vulva wound bed once daily with dressing changes Patient not taking: Reported on 08/03/2017 06/18/17   Joylene John D, NP  gabapentin (NEURONTIN) 300 MG capsule Take 1 capsule (300 mg total) by mouth 3 (three) times daily. Patient not taking: Reported on 08/03/2017 04/11/17   Nilda Simmer, NP  ondansetron (ZOFRAN) 8 MG tablet Take 1 tablet (8 mg total) by mouth every 8 (eight) hours as needed for nausea. Patient not taking: Reported on 08/03/2017 06/03/17   Heath Lark, MD  potassium chloride SA (K-DUR,KLOR-CON) 20 MEQ tablet TAKE 1 TABLET BY MOUTH ONCE A DAY. Patient not taking: Reported on 08/03/2017 11/29/16   Mikey Kirschner, MD  prochlorperazine (COMPAZINE) 10 MG tablet Take 1 tablet (  10 mg total) by mouth every 6 (six) hours as needed for nausea or vomiting. Patient not taking: Reported on 08/03/2017 06/03/17   Heath Lark, MD  sodium chloride flush 0.9 % SOLN injection Inject 10 mLs into the vein daily. 07/14/17   Heath Lark, MD   Allergies  Allergen Reactions  . Oxycodone Other (See Comments)    Intense H/A  . Celebrex  [Celecoxib] Other (See Comments)    GI upset   . Doxycycline Nausea Only    Gi upset Pt is able to tolerate with food  . Iron Other (See Comments)    Pt cannot tolerate some iron preparations. The upset her stomach.  . Lodine [Etodolac] Nausea Only    Gi upset  . Nabumetone Other (See Comments)    (relafen)  Gi upset  . Tetracycline Nausea And Vomiting  . Xarelto [Rivaroxaban] Hives   Review of Systems  Constitutional: Positive for activity change and appetite change.  Genitourinary: Positive for genital sores.  Neurological: Positive for weakness.  Psychiatric/Behavioral: Positive for sleep disturbance.    Physical Exam  General: Alert, awake, in no acute distress.  HEENT: No bruits, no goiter, no JVD Heart: Regular rate and rhythm. Murmur appreciated. Lungs: Good air movement, clear Abdomen: Soft, nontender, nondistended, positive bowel sounds.  Ext: No significant edema Skin: Warm and dry Neuro: Grossly intact, nonfocal.   Vital Signs: BP 115/68 (BP Location: Left Arm)   Pulse 73   Temp 98.2 F (36.8 C) (Oral)   Resp 18   Ht '5\' 3"'$  (1.6 m)   Wt 79 kg (174 lb 1.6 oz)   SpO2 94%   BMI 30.84 kg/m  Pain Scale: 0-10 POSS *See Group Information*: 1-Acceptable,Awake and alert Pain Score: 5    SpO2: SpO2: 94 % O2 Device:SpO2: 94 % O2 Flow Rate: .   IO: Intake/output summary:   Intake/Output Summary (Last 24 hours) at 08/08/2017 0956 Last data filed at 08/08/2017 0600 Gross per 24 hour  Intake 610 ml  Output -  Net 610 ml    LBM: Last BM Date: 08/08/17 Baseline Weight: Weight: 77.6 kg (171 lb 1.2 oz) Most recent weight: Weight: 79 kg (174 lb 1.6 oz)     Palliative Assessment/Data:   Flowsheet Rows     Most Recent Value  Intake Tab  Referral Department  Hospitalist  Unit at Time of Referral  Oncology Unit  Palliative Care Primary Diagnosis  Cancer  Date Notified  08/06/17  Palliative Care Type  New Palliative care  Reason for referral  Pain, Clarify  Goals of Care  Date of Admission  08/02/17  Date first seen by Palliative Care  08/07/17  # of days Palliative referral response time  1 Day(s)  # of days IP prior to Palliative referral  4  Clinical Assessment  Palliative Performance Scale Score  40%  Psychosocial & Spiritual Assessment  Palliative Care Outcomes  Patient/Family meeting held?  Yes  Who was at the meeting?  Patient  Palliative Care Outcomes  Clarified goals of care      Time In: 1550 Time Out: 1710 Time Total: 80 Greater than 50%  of this time was spent counseling and coordinating care related to the above assessment and plan.  Signed by: Micheline Rough, MD   Please contact Palliative Medicine Team phone at [phone] for questions and concerns.  For individual provider: See Shea Evans

## 2017-08-07 NOTE — Care Management Important Message (Signed)
Important Message  Patient Details  Name: [name] MRN: [id number] Date of Birth: [birth year]   Medicare Important Message Given:  Yes    Kerin Salen 08/07/2017, 10:38 AMImportant Message  Patient Details  Name: [name] MRN: [id number] Date of Birth: 1940-07-01   Medicare Important Message Given:  Yes    Kerin Salen 08/07/2017, 10:38 AM

## 2017-08-07 NOTE — Progress Notes (Signed)
[facility] Progress Note  Patient: [name] DSK:[id number]   PCP: Mikey Kirschner, MD DOB: [birth year]   DOA: 08/02/2017   DOS: 08/07/2017   Date of Service: the patient was seen and examined on 08/07/2017  Subjective: No acute nausea vomiting fever or chills.  Pain was actually well controlled overnight.  Oral intake is improving.  Brief hospital course: Pt. with PMH of vulvar cancer S/P radiation and on chemotherapy with carboplatin and Taxol, type II DM, aortic stenosis; admitted on 08/02/2017, presented with complaint of worsening discharge and pain from the vulvar ulcer, was found to have possible vulvar infection. Currently further plan is continue IV antibiotics.  Assessment and Plan: 1.  Vulvar squamous cell carcinoma. Chronic right vulvar lesion. Increasing drainage from the vulvar lesion with vulvar infection. Started on IV Unasyn on admission. CT abdomen and pelvis shows concern for possible disease progression versus infection. Patient follows up with Dr. Alvy Bimler as well as gynecological oncology. Appreciate their assistance in managing this patient. Currently on wound care. Pain is well controlled. There is also concern for thrombocytopenia, beta-lactam's and cephalosporins are generally associated with thrombocytopenia along with fluoroquinolones. Currently antibiotic are discontinued. Palliative care consulted for goals of care discussion as well as for symptom control. Patient reported to me that she definitely does not want any surgery, if the chemotherapy is just going to prolong this cancer and her pain she would not like chemotherapy to prolong that but she is still not sure about hospice. We will monitor for now.  2.  Chemotherapy-induced pancytopenia. Patient has chronic anemia, now presents with worsening thrombocytopenia. Likely, cytopenia is most likely secondary to chemotherapy although current worsen secondary to infection and beta-lactam  antibiotic. Monitor for now.  3.  Hypokalemia, hypomagnesemia, replacing.  4.  Moderate protein calorie malnutrition. Continue nutrition supplement.  Appreciate dietary consultation.  Diet: regular diet DVT Prophylaxis: subcutaneous Heparin  Advance goals of care discussion: full code  Family Communication: nofamily was present at bedside, at the time of interview.   Disposition:  Discharge to home.  Consultants: Oncology, GYN oncology Procedures: none  Antibiotics: Anti-infectives (From admission, onward)   Start     Dose/Rate Route Frequency Ordered Stop   08/03/17 1200  Ampicillin-Sulbactam (UNASYN) 3 g in sodium chloride 0.9 % 100 mL IVPB  Status:  Discontinued     3 g 200 mL/hr over 30 Minutes Intravenous Every 6 hours 08/03/17 0644 08/07/17 0751   08/03/17 0515  Ampicillin-Sulbactam (UNASYN) 3 g in sodium chloride 0.9 % 100 mL IVPB     3 g 200 mL/hr over 30 Minutes Intravenous  Once 08/03/17 0502 08/03/17 0600       Objective: Physical Exam: Vitals:   08/07/17 0500 08/07/17 0549 08/07/17 1314 08/07/17 1530  BP:  (!) 134/91 (!) 147/72   Pulse:  77 84   Resp:  18 16   Temp:  97.6 F (36.4 C) 97.9 F (36.6 C)   TempSrc:   Oral   SpO2:  100% 98% 97%  Weight: 80.2 kg (176 lb 11.2 oz)     Height:        Intake/Output Summary (Last 24 hours) at 08/07/2017 1941 Last data filed at 08/07/2017 1816 Gross per 24 hour  Intake 1180 ml  Output -  Net 1180 ml   Filed Weights   08/04/17 0403 08/06/17 0453 08/07/17 0500  Weight: 79.6 kg (175 lb 7.8 oz) 79.9 kg (176 lb 3.2 oz) 80.2 kg (176 lb 11.2 oz)  General: Alert, Awake and Oriented to Time, Place and Person. Appear in moderate distress, affect appropriate Eyes: PERRL, Conjunctiva normal ENT: Oral Mucosa clear moist. Neck: no JVD, no Abnormal Mass Or lumps Cardiovascular: S1 and S2 Present, aortic systolic Murmur, Peripheral Pulses Present Respiratory: normal respiratory effort, Bilateral Air entry equal and  Decreased, no use of accessory muscle, Clear to Auscultation, no Crackles, no wheezes Abdomen: Bowel Sound present, Soft and no tenderness, no hernia Skin: no redness, no Rash, no induration Extremities: no Pedal edema, no calf tenderness Neurologic: Grossly no focal neuro deficit. Bilaterally Equal motor strength  Data Reviewed: CBC: Recent Labs  Lab 08/03/17 0106 08/04/17 0400 08/04/17 1507 08/05/17 0546 08/06/17 0500 08/07/17 0356  WBC 3.7* 3.0* 3.6* 4.0 3.7* 3.7*  NEUTROABS 2.4  --   --  3.1 2.7 2.8  HGB 8.2* 7.2* 7.3* 8.8* 9.7* 9.5*  HCT 25.0* 22.8* 22.3* 26.6* 29.8* 29.8*  MCV 86.5 89.1 88.8 86.9 87.9 88.7  PLT 65* 47* 41* 37* 29* 24*   Basic Metabolic Panel: Recent Labs  Lab 08/04/17 0400 08/05/17 0546 08/05/17 1531 08/06/17 0500 08/07/17 0356  NA 139 151* 142 142 142  K 3.2* 2.9* 3.4* 4.0 3.6  CL 107 111 107 107 106  CO2 25 24 27 27 29   GLUCOSE 97 94 103* 97 110*  BUN 15 12 14 10 10   CREATININE 0.70 0.99 0.67 0.60 0.63  CALCIUM 8.5* 7.5* 8.9 9.0 9.0  MG  --   --  1.4* 1.8 1.4*    Liver Function Tests: Recent Labs  Lab 08/03/17 0106 08/04/17 0400 08/05/17 0546 08/06/17 0500 08/07/17 0356  AST 12* 10* 9* 11* 11*  ALT 12 10 7 9 9   ALKPHOS 70 56 50 57 53  BILITOT 0.9 0.4 0.9 0.4 0.6  PROT 6.2* 5.4* <3.0* 5.1* 5.3*  ALBUMIN 3.3* 2.7* 2.3* 2.6* 2.6*   No results for input(s): LIPASE, AMYLASE in the last 168 hours. No results for input(s): AMMONIA in the last 168 hours. Coagulation Profile: No results for input(s): INR, PROTIME in the last 168 hours. Cardiac Enzymes: No results for input(s): CKTOTAL, CKMB, CKMBINDEX, TROPONINI in the last 168 hours. BNP (last 3 results) No results for input(s): PROBNP in the last 8760 hours. CBG: Recent Labs  Lab 08/06/17 1704 08/06/17 2200 08/07/17 0813 08/07/17 1128 08/07/17 1647  GLUCAP 100* 93 88 113* 107*   Studies: No results found.  Scheduled Meds: . sodium chloride   Intravenous Once  . docusate  sodium  100 mg Oral BID  . feeding supplement (PRO-STAT SUGAR FREE 64)  30 mL Oral BID  . gabapentin  100 mg Oral TID  . morphine  30 mg Oral Q12H  . polyethylene glycol  17 g Oral Daily   Continuous Infusions:  PRN Meds: acetaminophen **OR** acetaminophen, fentaNYL (SUBLIMAZE) injection, HYDROcodone-acetaminophen, ondansetron (ZOFRAN) IV, sodium chloride flush  Time spent: 35 minutes  Author: Berle Mull, MD [facility] Pager: [phone] 08/07/2017 7:41 PM  If 7PM-7AM, please contact night-coverage at [email or web address], password [facility]

## 2017-08-08 ENCOUNTER — Encounter: Payer: Self-pay | Admitting: Oncology

## 2017-08-08 LAB — CBC WITH DIFFERENTIAL/PLATELET
Basophils Absolute: 0 10*3/uL (ref 0.0–0.1)
Basophils Relative: 0 %
EOS ABS: 0 10*3/uL (ref 0.0–0.7)
EOS PCT: 0 %
HCT: 31.3 % — ABNORMAL LOW (ref 36.0–46.0)
HEMOGLOBIN: 10 g/dL — AB (ref 12.0–15.0)
LYMPHS ABS: 0.5 10*3/uL — AB (ref 0.7–4.0)
LYMPHS PCT: 14 %
MCH: 28.5 pg (ref 26.0–34.0)
MCHC: 31.9 g/dL (ref 30.0–36.0)
MCV: 89.2 fL (ref 78.0–100.0)
MONOS PCT: 9 %
Monocytes Absolute: 0.3 10*3/uL (ref 0.1–1.0)
Neutro Abs: 2.7 10*3/uL (ref 1.7–7.7)
Neutrophils Relative %: 77 %
PLATELETS: 21 10*3/uL — AB (ref 150–400)
RBC: 3.51 MIL/uL — ABNORMAL LOW (ref 3.87–5.11)
RDW: 16.6 % — ABNORMAL HIGH (ref 11.5–15.5)
WBC: 3.5 10*3/uL — AB (ref 4.0–10.5)

## 2017-08-08 LAB — GLUCOSE, CAPILLARY
GLUCOSE-CAPILLARY: 107 mg/dL — AB (ref 70–99)
Glucose-Capillary: 102 mg/dL — ABNORMAL HIGH (ref 70–99)
Glucose-Capillary: 99 mg/dL (ref 70–99)
Glucose-Capillary: 103 mg/dL — ABNORMAL HIGH (ref 70–99)

## 2017-08-08 NOTE — Progress Notes (Signed)
She complained of some mild nausea, given antiemetics. She denies bleeding. Pain control is stable. I recommend family meeting with her son this afternoon at 3 PM and she agreed to proceed I will return this afternoon for further discussion of plan of care.

## 2017-08-08 NOTE — Progress Notes (Signed)
[facility] Progress Note  Patient: [name] NWG:[id number]   PCP: Mikey Kirschner, MD DOB: July 04, 1940   DOA: 08/02/2017   DOS: 08/08/2017   Date of Service: the patient was seen and examined on 08/08/2017  Subjective: pain better, no acute complains. Minimal oral intake  Brief hospital course: Pt. with PMH of vulvar cancer S/P radiation and on chemotherapy with carboplatin and Taxol, type II DM, aortic stenosis; admitted on 08/02/2017, presented with complaint of worsening discharge and pain from the vulvar ulcer, was found to have possible vulvar infection. Currently further plan is continue IV antibiotics.  Assessment and Plan: 1.  Vulvar squamous cell carcinoma. Chronic right vulvar lesion. Increasing drainage from the vulvar lesion with vulvar infection. Started on IV Unasyn on admission. CT abdomen and pelvis shows concern for possible disease progression versus infection. Patient follows up with Dr. Alvy Bimler as well as gynecological oncology. Appreciate their assistance in managing this patient. Currently on wound care. Pain is well controlled. There is also concern for thrombocytopenia, beta-lactam's and cephalosporins are generally associated with thrombocytopenia along with fluoroquinolones. Currently antibiotic are discontinued. Palliative care consulted for goals of care discussion as well as for symptom control. Patient reported to me that she definitely does not want any surgery, if the chemotherapy is just going to prolong this cancer and her pain she would not like chemotherapy to prolong that but she is still not sure about hospice. Family meeting today at 3 PM We will monitor for now.  2.  Chemotherapy-induced pancytopenia. Patient has chronic anemia, now presents with worsening thrombocytopenia. Likely, cytopenia is most likely secondary to chemotherapy although current worsen secondary to infection and beta-lactam antibiotic. Monitor for now.  3.   Hypokalemia, hypomagnesemia, replacing.  4.  Moderate protein calorie malnutrition. Continue nutrition supplement.  Appreciate dietary consultation.  Diet: regular diet DVT Prophylaxis: subcutaneous Heparin  Advance goals of care discussion: full code  Family Communication: nofamily was present at bedside, at the time of interview.   Disposition:  Discharge to home.  Consultants: Oncology, GYN oncology Procedures: none  Antibiotics: Anti-infectives (From admission, onward)   Start     Dose/Rate Route Frequency Ordered Stop   08/03/17 1200  Ampicillin-Sulbactam (UNASYN) 3 g in sodium chloride 0.9 % 100 mL IVPB  Status:  Discontinued     3 g 200 mL/hr over 30 Minutes Intravenous Every 6 hours 08/03/17 0644 08/07/17 0751   08/03/17 0515  Ampicillin-Sulbactam (UNASYN) 3 g in sodium chloride 0.9 % 100 mL IVPB     3 g 200 mL/hr over 30 Minutes Intravenous  Once 08/03/17 0502 08/03/17 0600       Objective: Physical Exam: Vitals:   08/07/17 1530 08/07/17 2045 08/08/17 0450 08/08/17 1437  BP:  (!) 150/76 115/68 133/76  Pulse:  81 73 91  Resp:  18 18 16   Temp:  97.8 F (36.6 C) 98.2 F (36.8 C) 98.2 F (36.8 C)  TempSrc:  Oral Oral Oral  SpO2: 97% 97% 94% 97%  Weight:   79 kg (174 lb 1.6 oz)   Height:        Intake/Output Summary (Last 24 hours) at 08/08/2017 1649 Last data filed at 08/08/2017 1400 Gross per 24 hour  Intake 610 ml  Output -  Net 610 ml   Filed Weights   08/06/17 0453 08/07/17 0500 08/08/17 0450  Weight: 79.9 kg (176 lb 3.2 oz) 80.2 kg (176 lb 11.2 oz) 79 kg (174 lb 1.6 oz)   General: Alert, Awake  and Oriented to Time, Place and Person. Appear in moderate distress, affect appropriate Eyes: PERRL, Conjunctiva normal ENT: Oral Mucosa clear moist. Neck: no JVD, no Abnormal Mass Or lumps Cardiovascular: S1 and S2 Present, aortic systolic Murmur, Peripheral Pulses Present Respiratory: normal respiratory effort, Bilateral Air entry equal and Decreased, no  use of accessory muscle, Clear to Auscultation, no Crackles, no wheezes Abdomen: Bowel Sound present, Soft and no tenderness, no hernia Skin: no redness, no Rash, no induration Extremities: no Pedal edema, no calf tenderness Neurologic: Grossly no focal neuro deficit. Bilaterally Equal motor strength  Data Reviewed: CBC: Recent Labs  Lab 08/03/17 0106  08/04/17 1507 08/05/17 0546 08/06/17 0500 08/07/17 0356 08/08/17 0439  WBC 3.7*   < > 3.6* 4.0 3.7* 3.7* 3.5*  NEUTROABS 2.4  --   --  3.1 2.7 2.8 2.7  HGB 8.2*   < > 7.3* 8.8* 9.7* 9.5* 10.0*  HCT 25.0*   < > 22.3* 26.6* 29.8* 29.8* 31.3*  MCV 86.5   < > 88.8 86.9 87.9 88.7 89.2  PLT 65*   < > 41* 37* 29* 24* 21*   < > = values in this interval not displayed.   Basic Metabolic Panel: Recent Labs  Lab 08/04/17 0400 08/05/17 0546 08/05/17 1531 08/06/17 0500 08/07/17 0356  NA 139 151* 142 142 142  K 3.2* 2.9* 3.4* 4.0 3.6  CL 107 111 107 107 106  CO2 25 24 27 27 29   GLUCOSE 97 94 103* 97 110*  BUN 15 12 14 10 10   CREATININE 0.70 0.99 0.67 0.60 0.63  CALCIUM 8.5* 7.5* 8.9 9.0 9.0  MG  --   --  1.4* 1.8 1.4*    Liver Function Tests: Recent Labs  Lab 08/03/17 0106 08/04/17 0400 08/05/17 0546 08/06/17 0500 08/07/17 0356  AST 12* 10* 9* 11* 11*  ALT 12 10 7 9 9   ALKPHOS 70 56 50 57 53  BILITOT 0.9 0.4 0.9 0.4 0.6  PROT 6.2* 5.4* <3.0* 5.1* 5.3*  ALBUMIN 3.3* 2.7* 2.3* 2.6* 2.6*   No results for input(s): LIPASE, AMYLASE in the last 168 hours. No results for input(s): AMMONIA in the last 168 hours. Coagulation Profile: No results for input(s): INR, PROTIME in the last 168 hours. Cardiac Enzymes: No results for input(s): CKTOTAL, CKMB, CKMBINDEX, TROPONINI in the last 168 hours. BNP (last 3 results) No results for input(s): PROBNP in the last 8760 hours. CBG: Recent Labs  Lab 08/07/17 1647 08/07/17 2041 08/08/17 0738 08/08/17 1133 08/08/17 1636  GLUCAP 107* 102* 102* 99 103*   Studies: No results  found.  Scheduled Meds: . sodium chloride   Intravenous Once  . docusate sodium  100 mg Oral BID  . feeding supplement (PRO-STAT SUGAR FREE 64)  30 mL Oral BID  . gabapentin  100 mg Oral TID  . morphine  30 mg Oral Q12H  . polyethylene glycol  17 g Oral Daily   Continuous Infusions:  PRN Meds: acetaminophen **OR** acetaminophen, fentaNYL (SUBLIMAZE) injection, HYDROcodone-acetaminophen, ondansetron (ZOFRAN) IV, sodium chloride flush  Time spent: 35 minutes  Author: Berle Mull, MD [facility] Pager: [phone] 08/08/2017 4:49 PM  If 7PM-7AM, please contact night-coverage at [email or web address], password [facility]

## 2017-08-08 NOTE — Progress Notes (Signed)
PT Cancellation Note  Patient Details Name: [name] MRN: [id number] DOB: 23-Dec-1940   Cancelled Treatment:    Reason Eval/Treat Not Completed: Other (comment) Per OT, pt declines to participate today.  Pt with scheduled palliative meeting later today as well.  Will check back as schedule permits.   Roen Macgowan,KATHrine E 08/08/2017, 11:08 AM Carmelia Bake, PT, DPT 08/08/2017 Pager: [phone]

## 2017-08-08 NOTE — Progress Notes (Signed)
OT Cancellation Note  Patient Details Name: [name] MRN: [id number] DOB: Aug 02, 1940   Cancelled Treatment:    Reason Eval/Treat Not Completed: Other (comment). Pt doesn't feel up to therapy. Will check back another day  Khush Pasion 08/08/2017, 11:04 AM  Lesle Chris, OTR/L [phone] 08/08/2017

## 2017-08-08 NOTE — Progress Notes (Signed)
[name]   DOB:05/07/1940   QA#:[id number]    Assessment & Plan:   Vulva cancer [facility]) I have reviewed recommendation from GYN oncologist. It was felt that her cancer is progressing I have discussed options with the patient Option #1 would include major surgery including reconstruction surgery. Option #2 would include palliative chemotherapy with single agent chemotherapy.  I estimated response rate in the regional 20% and the intent of treatment is still non-curative Option #3 would include palliative care with hospice She has declined the plan for major surgery Ultimately, she is interested to pursue further palliative chemotherapy in the outpatient setting  Multifactorialanemia She felt better today. She has received 2 units of blood transfusion on 08/04/2017 her wound healing will improve with blood transfusion support I recommend close monitoring of blood count while hospitalized and transfusion as needed  Acute thrombocytopenia Likely due to recent chemotherapyand side effects of Unasyn She does not need platelet transfusion unless bleeding or platelet count less than10,000 I recommend continue close follow-up of blood count daily I have stopped antibiotics treatment  If her platelet count improves tomorrow and she felt better, she can be discharged  Mild leukopenia Currently, she is not neutropenic We will monitor carefully  Diabetes mellitus with diabetic neuropathy, without long-term current use of insulin ([place]) Her diabetes is well controlled with metformin and dietary modification She will continue the same  Severe protein calorie malnutrition I recommend formal dietitian consult for evaluation  Cancer associated pain She has recent uncontrolled pain  Her prescription pain medicine was increased to MS Contin 30 mg twice a dayalong with breakthrough pain medicine as needed She will continue pain management for now Recommend palliative care consult for  symptom management and medication review  Likelyvulva/wound infection She has completed 4 days of intravenous antibiotic therapy We will stop  Significant electrolyte imbalance, resolved Recommend close monitoring  Discharge planning and goals of care discussion I have extensive discussion with the patient and family member, lasting at least half an hour We discussed numerous options, prognosis, the risks and benefits of each choice Ultimately, the patient would like to continue aggressive supportive care including palliative chemotherapy in the outpatient setting If she continues to improve tomorrow with her blood counts trending up, she can potentially be discharged over the weekend and I we have already set up outpatient follow-up appointment to see her back on August 14, 2017 The patient will resume home health agency care after discharge  Heath Lark, MD 08/08/2017  4:10 PM   Subjective:  She feels better today.  She has less pain and eating better. The patient denies any recent signs or symptoms of bleeding such as spontaneous epistaxis, hematuria or hematochezia. We have family discussion in the presence of her son and her daughter-in-law  Objective:  Vitals:   08/08/17 0450 08/08/17 1437  BP: 115/68 133/76  Pulse: 73 91  Resp: 18 16  Temp: 98.2 F (36.8 C) 98.2 F (36.8 C)  SpO2: 94% 97%     Intake/Output Summary (Last 24 hours) at 08/08/2017 1610 Last data filed at 08/08/2017 1400 Gross per 24 hour  Intake 610 ml  Output -  Net 610 ml    GENERAL:alert, no distress and comfortable Musculoskeletal:no cyanosis of digits and no clubbing  NEURO: alert & oriented x 3 with fluent speech, no focal motor/sensory deficits   Labs:  Lab Results  Component Value Date   WBC 3.5 (L) 08/08/2017   HGB 10.0 (L) 08/08/2017   HCT  31.3 (L) 08/08/2017   MCV 89.2 08/08/2017   PLT 21 (LL) 08/08/2017   NEUTROABS 2.7 08/08/2017    Lab Results  Component Value Date   NA 142  08/07/2017   K 3.6 08/07/2017   CL 106 08/07/2017   CO2 29 08/07/2017

## 2017-08-08 NOTE — Progress Notes (Signed)
OT Cancellation Note  Patient Details Name: [name] MRN: [id number] DOB: 09-30-1940   Cancelled Treatment:    Reason Eval/Treat Not Completed: Other (comment).  Pt would like OT to return a little later. Will check back.  Alyiah Ulloa 08/08/2017, 8:29 AM  Lesle Chris, OTR/L [phone] 08/08/2017

## 2017-08-09 DIAGNOSIS — Z515 Encounter for palliative care: Secondary | ICD-10-CM

## 2017-08-09 DIAGNOSIS — Z7189 Other specified counseling: Secondary | ICD-10-CM

## 2017-08-09 DIAGNOSIS — C519 Malignant neoplasm of vulva, unspecified: Principal | ICD-10-CM

## 2017-08-09 LAB — CBC WITH DIFFERENTIAL/PLATELET
BASOS ABS: 0 10*3/uL (ref 0.0–0.1)
BASOS PCT: 0 %
EOS PCT: 1 %
Eosinophils Absolute: 0 10*3/uL (ref 0.0–0.7)
HCT: 30.8 % — ABNORMAL LOW (ref 36.0–46.0)
Hemoglobin: 9.7 g/dL — ABNORMAL LOW (ref 12.0–15.0)
LYMPHS PCT: 23 %
Lymphs Abs: 0.8 10*3/uL (ref 0.7–4.0)
MCH: 28 pg (ref 26.0–34.0)
MCHC: 31.5 g/dL (ref 30.0–36.0)
MCV: 88.8 fL (ref 78.0–100.0)
MONO ABS: 0.3 10*3/uL (ref 0.1–1.0)
MONOS PCT: 8 %
NEUTROS ABS: 2.5 10*3/uL (ref 1.7–7.7)
Neutrophils Relative %: 68 %
PLATELETS: 18 10*3/uL — AB (ref 150–400)
RBC: 3.47 MIL/uL — ABNORMAL LOW (ref 3.87–5.11)
RDW: 16.6 % — AB (ref 11.5–15.5)
WBC: 3.6 10*3/uL — ABNORMAL LOW (ref 4.0–10.5)

## 2017-08-09 LAB — GLUCOSE, CAPILLARY
Glucose-Capillary: 83 mg/dL (ref 70–99)
Glucose-Capillary: 105 mg/dL — ABNORMAL HIGH (ref 70–99)
Glucose-Capillary: 107 mg/dL — ABNORMAL HIGH (ref 70–99)
Glucose-Capillary: 100 mg/dL — ABNORMAL HIGH (ref 70–99)

## 2017-08-09 NOTE — Progress Notes (Signed)
PT Cancellation Note  Patient Details Name: [name] MRN: [id number] DOB: 1940-12-01   Cancelled Treatment:     Pt had participated in OT evaluation this morning and currently in bathroom with nursing staff. Pt politely agreed to hold off on PT session until later this afternoon or tomorrow if possible. She is fatigued at this time. Pt's platelets are also still very low at this time as well.    Clide Dales 08/09/2017, 12:33 PM  Clide Dales, PT Pager: [phone] 08/09/2017

## 2017-08-09 NOTE — Progress Notes (Signed)
[name]   DOB:11-24-1940   NL#:[id number]    Assessment & Plan:   Vulva cancer [facility]) Ultimately, she is interested to pursue further palliative chemotherapy in the outpatient setting Continue supportive care  Multifactorialanemia She felt better today. She has received 2 units of blood transfusion on 08/04/2017 her wound healing will improve with blood transfusion support I recommend close monitoring of blood count while hospitalizedand transfusion as needed  Acute thrombocytopenia Likely due to recent chemotherapyand side effects of Unasyn She does not need platelet transfusion unless bleeding or platelet count less than10,000 I recommend continue close follow-up of blood count daily I have stopped antibiotics treatment If her platelet count improves tomorrow and she felt better, she can be discharged I will check serum vitamin B12 tomorrow Due to high risk of bleeding, she is not ready to be discharged today I educated the patient to watch out for signs and symptoms of bleeding  Mild leukopenia Currently, she is not neutropenic We will monitor carefully  Diabetes mellitus with diabetic neuropathy, without long-term current use of insulin ([place]) Her diabetes is well controlled with metformin and dietary modification She will continue the same  Severe protein calorie malnutrition I recommend formal dietitian consult for evaluation  Cancer associated pain She has recent uncontrolled pain  Her prescription pain medicine was increased to MS Contin 30 mg twice a dayalong with breakthrough pain medicine as needed She will continue pain management for now Recommend palliative care consult for symptom management and medication review  Likelyvulva/wound infection She has completed 4 days of intravenous antibiotic therapy No further indication for antibiotics  Significant electrolyte imbalance, resolved Recommend close monitoring  Discharge planning and  goals of care discussion Ultimately, the patient would like to continue aggressive supportive care including palliative chemotherapy in the outpatient setting If she continues to improve tomorrow with her blood counts trending up, she can potentially be discharged over the weekend and I we have already set up outpatient follow-up appointment to see her back on August 14, 2017 The patient will resume home health agency care after discharge I will follow tomorrow to assist in decision about discharge planning.  At present time, I felt that her progressive thrombocytopenia makes it dangerous for her to be discharged  Heath Lark, MD 08/09/2017  7:20 AM   Subjective:  She feels well.  She denies significant pain. The patient denies any recent signs or symptoms of bleeding such as spontaneous epistaxis, hematuria or hematochezia. No fever or chills.  Objective:  Vitals:   08/08/17 2041 08/09/17 0352  BP: 117/72 (!) 111/59  Pulse: 83 84  Resp: 18 18  Temp: 98 F (36.7 C) 98.3 F (36.8 C)  SpO2: 99% 100%     Intake/Output Summary (Last 24 hours) at 08/09/2017 0720 Last data filed at 08/09/2017 0600 Gross per 24 hour  Intake 850 ml  Output -  Net 850 ml    GENERAL:alert, no distress and comfortable SKIN: The vulval lesion is reexamined today.  Healthy granulation tissue is noted with surrounding erythematous edge around the ulcer.  There is also redness on the contralateral side suspicious for malignancy Musculoskeletal:no cyanosis of digits and no clubbing  NEURO: alert & oriented x 3 with fluent speech, no focal motor/sensory deficits   Labs:  Lab Results  Component Value Date   WBC 3.6 (L) 08/09/2017   HGB 9.7 (L) 08/09/2017   HCT 30.8 (L) 08/09/2017   MCV 88.8 08/09/2017   PLT 18 (LL) 08/09/2017  NEUTROABS 2.5 08/09/2017    Lab Results  Component Value Date   NA 142 08/07/2017   K 3.6 08/07/2017   CL 106 08/07/2017   CO2 29 08/07/2017

## 2017-08-09 NOTE — Progress Notes (Signed)
[facility] Progress Note  Patient: [name] GBT:[id number]   PCP: Mikey Kirschner, MD DOB: 01-25-1940   DOA: 08/02/2017   DOS: 08/09/2017   Date of Service: the patient was seen and examined on 08/09/2017  Subjective: The patient does not have any acute complaint.  Pain remains well controlled.  No nausea no vomiting no active bleeding anywhere no dizziness no headache.  Brief hospital course: Pt. with PMH of vulvar cancer S/P radiation and on chemotherapy with carboplatin and Taxol, type II DM, aortic stenosis; admitted on 08/02/2017, presented with complaint of worsening discharge and pain from the vulvar ulcer, was found to have possible vulvar infection. Currently further plan is close monitoring of thrombocytopenia  Assessment and Plan: 1.  Vulvar squamous cell carcinoma. Chronic right vulvar lesion. Increasing drainage from the vulvar lesion with vulvar infection. Started on IV Unasyn on admission. CT abdomen and pelvis shows concern for possible disease progression versus infection. Patient follows up with Dr. Alvy Bimler as well as gynecological oncology. Appreciate their assistance in managing this patient. Currently on wound care. Pain is well controlled. There is also concern for thrombocytopenia, beta-lactam's and cephalosporins are generally associated with thrombocytopenia along with fluoroquinolones. Currently antibiotic are discontinued. Palliative care consulted for goals of care discussion as well as for symptom control.  2.  Chemotherapy-induced pancytopenia. Patient has chronic anemia, now presents with worsening thrombocytopenia. Likely, cytopenia is most likely secondary to chemotherapy although current worsen secondary to infection and beta-lactam antibiotic. Primarily patient's platelets are trending downwards now in range for potential transfusion indication. Need to monitor closely while inpatient.  3.  Hypokalemia, hypomagnesemia, replacing.  4.   Moderate protein calorie malnutrition. Continue nutrition supplement.  Appreciate dietary consultation.  5 goals of care discussion Patient reported to me that she definitely does not want any surgery, if the chemotherapy is just going to prolong this cancer and her pain she would not like chemotherapy to prolong that but she is still not sure about hospice. Dr. Alvy Bimler had meeting with patient and son, patient would like to continue palliative chemotherapy for now. I provided the patient MOST form and palliative care provided hard choices book.  Patient informed me that if you keeping me alive on life support just to see who is coming in the room and going out of the room and if I do not have any quality of life I would not like to do it and let God take care of me.  Although before making any decision she would like to discuss with her son.  Diet: regular diet DVT Prophylaxis: subcutaneous Heparin  Advance goals of care discussion: full code  Family Communication: no family was present at bedside, at the time of interview.   Disposition:  Discharge to home.  Consultants: Oncology, GYN oncology Procedures: none  Antibiotics: Anti-infectives (From admission, onward)   Start     Dose/Rate Route Frequency Ordered Stop   08/03/17 1200  Ampicillin-Sulbactam (UNASYN) 3 g in sodium chloride 0.9 % 100 mL IVPB  Status:  Discontinued     3 g 200 mL/hr over 30 Minutes Intravenous Every 6 hours 08/03/17 0644 08/07/17 0751   08/03/17 0515  Ampicillin-Sulbactam (UNASYN) 3 g in sodium chloride 0.9 % 100 mL IVPB     3 g 200 mL/hr over 30 Minutes Intravenous  Once 08/03/17 0502 08/03/17 0600      Objective: Physical Exam: Vitals:   08/08/17 1437 08/08/17 2041 08/09/17 0352 08/09/17 1311  BP: 133/76 117/72 (!) 111/59  101/62  Pulse: 91 83 84 83  Resp: 16 18 18    Temp: 98.2 F (36.8 C) 98 F (36.7 C) 98.3 F (36.8 C) 98.8 F (37.1 C)  TempSrc: Oral Oral Oral   SpO2: 97% 99% 100% 100%    Weight:      Height:        Intake/Output Summary (Last 24 hours) at 08/09/2017 1659 Last data filed at 08/09/2017 0600 Gross per 24 hour  Intake 850 ml  Output -  Net 850 ml   Filed Weights   08/06/17 0453 08/07/17 0500 08/08/17 0450  Weight: 79.9 kg (176 lb 3.2 oz) 80.2 kg (176 lb 11.2 oz) 79 kg (174 lb 1.6 oz)   General: Alert, Awake and Oriented to Time, Place and Person. Appear in moderate distress, affect appropriate Eyes: PERRL, Conjunctiva normal ENT: Oral Mucosa clear moist. Neck: no JVD, no Abnormal Mass Or lumps Cardiovascular: S1 and S2 Present, aortic systolic Murmur, Peripheral Pulses Present Respiratory: normal respiratory effort, Bilateral Air entry equal and Decreased, no use of accessory muscle, Clear to Auscultation, no Crackles, no wheezes Abdomen: Bowel Sound present, Soft and no tenderness, no hernia Skin: no redness, no Rash, no induration Extremities: no Pedal edema, no calf tenderness Neurologic: Grossly no focal neuro deficit. Bilaterally Equal motor strength  Data Reviewed: CBC: Recent Labs  Lab 08/05/17 0546 08/06/17 0500 08/07/17 0356 08/08/17 0439 08/09/17 0323  WBC 4.0 3.7* 3.7* 3.5* 3.6*  NEUTROABS 3.1 2.7 2.8 2.7 2.5  HGB 8.8* 9.7* 9.5* 10.0* 9.7*  HCT 26.6* 29.8* 29.8* 31.3* 30.8*  MCV 86.9 87.9 88.7 89.2 88.8  PLT 37* 29* 24* 21* 18*   Basic Metabolic Panel: Recent Labs  Lab 08/04/17 0400 08/05/17 0546 08/05/17 1531 08/06/17 0500 08/07/17 0356  NA 139 151* 142 142 142  K 3.2* 2.9* 3.4* 4.0 3.6  CL 107 111 107 107 106  CO2 25 24 27 27 29   GLUCOSE 97 94 103* 97 110*  BUN 15 12 14 10 10   CREATININE 0.70 0.99 0.67 0.60 0.63  CALCIUM 8.5* 7.5* 8.9 9.0 9.0  MG  --   --  1.4* 1.8 1.4*    Liver Function Tests: Recent Labs  Lab 08/03/17 0106 08/04/17 0400 08/05/17 0546 08/06/17 0500 08/07/17 0356  AST 12* 10* 9* 11* 11*  ALT 12 10 7 9 9   ALKPHOS 70 56 50 57 53  BILITOT 0.9 0.4 0.9 0.4 0.6  PROT 6.2* 5.4* <3.0* 5.1*  5.3*  ALBUMIN 3.3* 2.7* 2.3* 2.6* 2.6*   No results for input(s): LIPASE, AMYLASE in the last 168 hours. No results for input(s): AMMONIA in the last 168 hours. Coagulation Profile: No results for input(s): INR, PROTIME in the last 168 hours. Cardiac Enzymes: No results for input(s): CKTOTAL, CKMB, CKMBINDEX, TROPONINI in the last 168 hours. BNP (last 3 results) No results for input(s): PROBNP in the last 8760 hours. CBG: Recent Labs  Lab 08/08/17 1133 08/08/17 1636 08/08/17 2151 08/09/17 0727 08/09/17 1205  GLUCAP 99 103* 107* 83 105*   Studies: No results found.  Scheduled Meds: . sodium chloride   Intravenous Once  . docusate sodium  100 mg Oral BID  . feeding supplement (PRO-STAT SUGAR FREE 64)  30 mL Oral BID  . gabapentin  100 mg Oral TID  . morphine  30 mg Oral Q12H  . polyethylene glycol  17 g Oral Daily   Continuous Infusions:  PRN Meds: acetaminophen **OR** acetaminophen, fentaNYL (SUBLIMAZE) injection, HYDROcodone-acetaminophen, ondansetron (ZOFRAN) IV, sodium  chloride flush  Time spent: 35 minutes  Author: Berle Mull, MD [facility] Pager: [phone] 08/09/2017 4:59 PM  If 7PM-7AM, please contact night-coverage at [email or web address], password [facility]

## 2017-08-09 NOTE — Progress Notes (Signed)
Daily Progress Note   Patient Name: [name]       Date: 08/09/2017 DOB: 09-28-1940  Age: 77 y.o. MRN#: [id number] Attending Physician: Lavina Hamman, MD Primary Care Physician: Mikey Kirschner, MD Admit Date: 08/02/2017  Reason for Consultation/Follow-up: Establishing goals of care, Non pain symptom management and Pain control  Subjective: Met today with Ms. Texeira.    Reviewed that her goal is to feel as well as possible for as long as possible and after discussion with Dr. Alvy Bimler her plan is to pursue further chemotherapy with palliative intent.  Also discussed goals and MOST form that was introduced to her by Dr. Posey Pronto this AM.  She continues to consider long term goals and reports needing time to discuss with her son.  Length of Stay: 5  Current Medications: Scheduled Meds:  . sodium chloride   Intravenous Once  . docusate sodium  100 mg Oral BID  . feeding supplement (PRO-STAT SUGAR FREE 64)  30 mL Oral BID  . gabapentin  100 mg Oral TID  . morphine  30 mg Oral Q12H  . polyethylene glycol  17 g Oral Daily    Continuous Infusions:   PRN Meds: acetaminophen **OR** acetaminophen, fentaNYL (SUBLIMAZE) injection, HYDROcodone-acetaminophen, ondansetron (ZOFRAN) IV, sodium chloride flush  Physical Exam      General: Alert, awake, in no acute distress.  HEENT: No bruits, no goiter, no JVD Heart: Regular rate and rhythm. Murmur appreciated. Lungs: Good air movement, clear Abdomen: Soft, nontender, nondistended, positive bowel sounds.  Ext: No significant edema Skin: Warm and dry Neuro: Grossly intact, nonfocal.  Vital Signs: BP (!) 111/59 (BP Location: Left Arm)   Pulse 84   Temp 98.3 F (36.8 C) (Oral)   Resp 18   Ht _0  (1.6 m)   Wt 79 kg (174 lb 1.6  oz)   SpO2 100%   BMI 30.84 kg/m  SpO2: SpO2: 100 % O2 Device: O2 Device: Room Air O2 Flow Rate:    Intake/output summary:   Intake/Output Summary (Last 24 hours) at 08/09/2017 1257 Last data filed at 08/09/2017 0600 Gross per 24 hour  Intake 850 ml  Output -  Net 850 ml   LBM: Last BM Date: 08/08/17 Baseline Weight: Weight: 77.6 kg (171 lb 1.2 oz) Most recent weight: Weight: 79 kg (  174 lb 1.6 oz)       Palliative Assessment/Data:    Flowsheet Rows     Most Recent Value  Intake Tab  Referral Department  Hospitalist  Unit at Time of Referral  Oncology Unit  Palliative Care Primary Diagnosis  Cancer  Date Notified  08/06/17  Palliative Care Type  New Palliative care  Reason for referral  Pain, Clarify Goals of Care  Date of Admission  08/02/17  Date first seen by Palliative Care  08/07/17  # of days Palliative referral response time  1 Day(s)  # of days IP prior to Palliative referral  4  Clinical Assessment  Palliative Performance Scale Score  40%  Psychosocial & Spiritual Assessment  Palliative Care Outcomes  Patient/Family meeting held?  Yes  Who was at the meeting?  Patient  Palliative Care Outcomes  Clarified goals of care      Patient Active Problem List   Diagnosis Date Noted  . Pancytopenia ([place]) 08/03/2017  . Cancer associated pain 07/25/2017  . S/P PICC central line placement 06/12/2017  . Diabetes mellitus with diabetic neuropathy, without long-term current use of insulin ([place]) 06/02/2017  . Protein-calorie malnutrition, moderate ([place]) 05/27/2017  . Vulvar abscess 05/13/2017  . Iron deficiency anemia   . Melena   . Vulvar pain 04/12/2017  . Caruncle of labium 03/17/2017  . Neuropathic pain of right thigh 03/17/2017  . Pulmonary nodules 12/13/2016  . Microcytic anemia 05/30/2016  . Acute blood loss anemia   . Anemia of chronic disease   . GIB (gastrointestinal bleeding) 05/04/2016  . Symptomatic anemia 05/03/2016  . GI bleed 05/03/2016  .  Cellulitis 04/24/2016  . Vulva cancer ([place]) 02/27/2016  . UTI (urinary tract infection) 02/26/2016  . Labial lesion 02/10/2016  . Hypoestrogenism 08/25/2015  . Osteopenia 08/25/2015  . Encounter for long-term opiate analgesic use 07/25/2014  . Osteoarthritis of left hip 10/27/2013  . S/P total hip arthroplasty 10/27/2013  . Degenerative arthritis 12/28/2012  . Aortic valve stenosis, mild 07/30/2012  . Vitamin D deficiency 07/30/2012  . Prediabetes 07/06/2012  . Essential hypertension, benign 07/06/2012  . Hyperlipemia 07/06/2012  . COLONIC POLYPS, HX OF 02/27/2009  . COLITIS 08/23/2008  . CARDIAC MURMUR 08/02/2008  . DIARRHEA 08/02/2008    Palliative Care Assessment & Plan   Patient Profile: 77 y.o. female  with past medical history of vulvar cancer s/p radiation and currently on chemo admitted on 08/02/2017 with worsening pain and discharge with concern for possible infection.   Recommendations/Plan:  Desires to continue with palliative chemotherapy.  She appreciates Dr. Alvy Bimler and her guidance regarding care plan moving forward.  Pain: Continue to use medications as needed.  She reports good control and declined changes again today.  Will continue to review daily.  She may benefit from increase in her long acting agent as well as consider rotation to another rescue opioid if it appears likely that acetaminophen component is going to limit her medication usage.  We reviewed a MOST form again (she initially reviewed with Dr. Posey Pronto this AM) and discussed how to develop plan of care to focus on continuing therapies that would maximize chance of being well enough to return home and limiting therapies not in line with this goal.  She wants to review Hard Choices for [employer] and discuss further with her son.  Goals of Care and Additional Recommendations:  Limitations on Scope of Treatment: Full Scope Treatment  Code Status:    Code Status Orders  (From admission,  onward)         Start     Ordered   08/03/17 0551  Full code  Continuous     08/03/17 0552    Code Status History    Date Active Date Inactive Code Status Order ID Comments User Context   05/15/2017 1350 05/16/2017 1356 Full Code [id number]  Everitt Amber, MD Inpatient   04/20/2017 1649 04/22/2017 1809 Full Code [id number]  Georgette Shell, MD ED   10/17/2016 1755 10/18/2016 1502 Full Code [id number]  Lahoma Crocker, MD Inpatient   05/03/2016 1808 05/07/2016 1619 Full Code [id number]  Truett Mainland, DO Inpatient   02/27/2016 1629 02/28/2016 1421 Full Code [id number]  Lahoma Crocker, MD Inpatient   10/27/2013 1632 10/29/2013 1706 Full Code [id number]  Tobi Bastos, MD Inpatient    Advance Directive Documentation     Most Recent Value  Type of Advance Directive  Living will  Pre-existing out of facility DNR order (yellow form or pink MOST form)  -  "MOST" Form in Place?  -       Prognosis:   Unable to determine  Discharge Planning:  To Be Determined  Care plan was discussed with patient, Dr. Posey Pronto  Thank you for allowing the Palliative Medicine Team to assist in the care of this patient.   Total Time 40 Prolonged Time Billed  no       Greater than 50%  of this time was spent counseling and coordinating care related to the above assessment and plan.  Micheline Rough, MD  Please contact Palliative Medicine Team phone at [phone] for questions and concerns.

## 2017-08-09 NOTE — Evaluation (Signed)
Occupational Therapy Evaluation Patient Details Name: [name] MRN: [id number] DOB: 12-20-1940 Today's Date: 08/09/2017    History of Present Illness pt was admitted wtih vulvar pain; h/o vulvar CA   Clinical Impression   This 77 year old female was admitted for the above.  She was independent with most adls at home except she didn't wear LB clothing other than socks, which husband assisted her with.  He perform IADLs.  Pt will benefit from continued OT to increase endurance and independence with adls. Goals are for supervision level. She mostly needs min guard assist at this time    Follow Up Recommendations  Supervision/Assistance - 24 hour;Home health OT    Equipment Recommendations  None recommended by OT    Recommendations for Other Services       Precautions / Restrictions Precautions Precautions: Fall Precaution Comments: 7/20 platelets 18,000 Restrictions Weight Bearing Restrictions: No      Mobility Bed Mobility Overal bed mobility: Needs Assistance             General bed mobility comments: min guard and extra time to get OOB; used bed rails  Transfers Overall transfer level: Needs assistance Equipment used: Rolling walker (2 wheeled) Transfers: Sit to/from Stand Sit to Stand: Min guard         General transfer comment: for safety    Balance                                           ADL either performed or assessed with clinical judgement   ADL Overall ADL's : Needs assistance/impaired                         Toilet Transfer: Min guard;Ambulation(chair)             General ADL Comments: ambulated in room. Used RW at first and then pt wanted to walk without it.  Not unsteady, but min guard for safety as platelets are low. She sponge bathes at baseline and only wears gowns--no pants/underwear. Husband assists her with socks and cooks     Museum/gallery curator      Pertinent  Vitals/Pain Pain Assessment: No/denies pain     Hand Dominance     Extremity/Trunk Assessment Upper Extremity Assessment Upper Extremity Assessment: Generalized weakness           Communication Communication Communication: No difficulties   Cognition Arousal/Alertness: Awake/alert Behavior During Therapy: WFL for tasks assessed/performed Overall Cognitive Status: Within Functional Limits for tasks assessed                                     General Comments       Exercises     Shoulder Instructions      Home Living Family/patient expects to be discharged to:: Private residence Living Arrangements: Spouse/significant other                 Bathroom Shower/Tub: Tub/shower unit;Walk-in shower   Bathroom Toilet: Handicapped height     Home Equipment: Walker - 2 wheels;Crutches;Cane - single point;Shower seat   Additional Comments: husband on 02 and uses RW. Son lives behind her; owns his own businee  Prior Functioning/Environment Level of Independence: Independent                 OT Problem List: Decreased strength;Decreased activity tolerance;Decreased knowledge of use of DME or AE      OT Treatment/Interventions: Self-care/ADL training;DME and/or AE instruction;Patient/family education;Therapeutic activities;Energy conservation    OT Goals(Current goals can be found in the care plan section) Acute Rehab OT Goals Patient Stated Goal: none stated OT Goal Formulation: With patient Time For Goal Achievement: 08/23/17 Potential to Achieve Goals: Good ADL Goals Pt Will Perform Grooming: with supervision;standing Pt Will Transfer to Toilet: with supervision;ambulating;bedside commode Additional ADL Goal #1: pt will perform bed mobility from flat bed at supervision level in preparation for adls  OT Frequency: Min 2X/week   Barriers to D/C:            Co-evaluation              AM-PAC PT "6 Clicks" Daily Activity      Outcome Measure Help from another person eating meals?: None Help from another person taking care of personal grooming?: A Little Help from another person toileting, which includes using toliet, bedpan, or urinal?: A Little Help from another person bathing (including washing, rinsing, drying)?: A Little Help from another person to put on and taking off regular upper body clothing?: A Little Help from another person to put on and taking off regular lower body clothing?: A Lot 6 Click Score: 18   End of Session Nurse Communication: (no alarm in room; pt states she will call)  Activity Tolerance: Patient tolerated treatment well Patient left: in chair;with call bell/phone within reach  OT Visit Diagnosis: Muscle weakness (generalized) (M62.81)                Time: 1027-1100 OT Time Calculation (min): 33 min Charges:  OT General Charges $OT Visit: 1 Visit OT Evaluation $OT Eval Low Complexity: 1 Low OT Treatments $Therapeutic Activity: 8-22 mins G-Codes:     [place], OTR/L [phone] 08/09/2017  Mukund Weinreb 08/09/2017, 11:12 AM

## 2017-08-09 NOTE — Progress Notes (Signed)
Daily Progress Note   Patient Name: [name]       Date: 08/09/2017 DOB: 04-25-1940  Age: 77 y.o. MRN#: [id number] Attending Physician: Lavina Hamman, MD Primary Care Physician: Mikey Kirschner, MD Admit Date: 08/02/2017  Reason for Consultation/Follow-up: Establishing goals of care, Non pain symptom management and Pain control  Subjective: Met today with [name].  She reports having the opportunity to sit down with Dr. Alvy Bimler and her son and daughter-in-law.  She continues to report that her goal is to feel as well as possible for as long as possible and after discussion with Dr. Alvy Bimler her plan is to pursue further chemotherapy with palliative intent.  She reports wanting to live as long as she can, but not wanting to "try to hang around too long when God calls me home."  I then discussed with her regarding aggressive interventions at the end of life as well as any potential limits on care she receives while she is in the hospital.  She reports needing to think about this and discuss further with her son.  She would like for me to drop off a copy of [facility] [place] for her to take with her to read.  Length of Stay: 5  Current Medications: Scheduled Meds:  . sodium chloride   Intravenous Once  . docusate sodium  100 mg Oral BID  . feeding supplement (PRO-STAT SUGAR FREE 64)  30 mL Oral BID  . gabapentin  100 mg Oral TID  . morphine  30 mg Oral Q12H  . polyethylene glycol  17 g Oral Daily    Continuous Infusions:   PRN Meds: acetaminophen **OR** acetaminophen, fentaNYL (SUBLIMAZE) injection, HYDROcodone-acetaminophen, ondansetron (ZOFRAN) IV, sodium chloride flush  Physical Exam      General: Alert, awake, in no acute distress.  HEENT: No bruits,  no goiter, no JVD Heart: Regular rate and rhythm. Murmur appreciated. Lungs: Good air movement, clear Abdomen: Soft, nontender, nondistended, positive bowel sounds.  Ext: No significant edema Skin: Warm and dry Neuro: Grossly intact, nonfocal.  Vital Signs: BP (!) 111/59 (BP Location: Left Arm)   Pulse 84   Temp 98.3 F (36.8 C) (Oral)   Resp 18   Ht _0  (1.6 m)   Wt 79 kg (174 lb 1.6 oz)  SpO2 100%   BMI 30.84 kg/m  SpO2: SpO2: 100 % O2 Device: O2 Device: Room Air O2 Flow Rate:    Intake/output summary:   Intake/Output Summary (Last 24 hours) at 08/09/2017 1003 Last data filed at 08/09/2017 0600 Gross per 24 hour  Intake 850 ml  Output -  Net 850 ml   LBM: Last BM Date: 08/08/17 Baseline Weight: Weight: 77.6 kg (171 lb 1.2 oz) Most recent weight: Weight: 79 kg (174 lb 1.6 oz)       Palliative Assessment/Data:    Flowsheet Rows     Most Recent Value  Intake Tab  Referral Department  Hospitalist  Unit at Time of Referral  Oncology Unit  Palliative Care Primary Diagnosis  Cancer  Date Notified  08/06/17  Palliative Care Type  New Palliative care  Reason for referral  Pain, Clarify Goals of Care  Date of Admission  08/02/17  Date first seen by Palliative Care  08/07/17  # of days Palliative referral response time  1 Day(s)  # of days IP prior to Palliative referral  4  Clinical Assessment  Palliative Performance Scale Score  40%  Psychosocial & Spiritual Assessment  Palliative Care Outcomes  Patient/Family meeting held?  Yes  Who was at the meeting?  Patient  Palliative Care Outcomes  Clarified goals of care      Patient Active Problem List   Diagnosis Date Noted  . Pancytopenia ([place]) 08/03/2017  . Cancer associated pain 07/25/2017  . S/P PICC central line placement 06/12/2017  . Diabetes mellitus with diabetic neuropathy, without long-term current use of insulin ([place]) 06/02/2017  . Protein-calorie malnutrition, moderate ([place]) 05/27/2017  . Vulvar  abscess 05/13/2017  . Iron deficiency anemia   . Melena   . Vulvar pain 04/12/2017  . Caruncle of labium 03/17/2017  . Neuropathic pain of right thigh 03/17/2017  . Pulmonary nodules 12/13/2016  . Microcytic anemia 05/30/2016  . Acute blood loss anemia   . Anemia of chronic disease   . GIB (gastrointestinal bleeding) 05/04/2016  . Symptomatic anemia 05/03/2016  . GI bleed 05/03/2016  . Cellulitis 04/24/2016  . Vulva cancer ([place]) 02/27/2016  . UTI (urinary tract infection) 02/26/2016  . Labial lesion 02/10/2016  . Hypoestrogenism 08/25/2015  . Osteopenia 08/25/2015  . Encounter for long-term opiate analgesic use 07/25/2014  . Osteoarthritis of left hip 10/27/2013  . S/P total hip arthroplasty 10/27/2013  . Degenerative arthritis 12/28/2012  . Aortic valve stenosis, mild 07/30/2012  . Vitamin D deficiency 07/30/2012  . Prediabetes 07/06/2012  . Essential hypertension, benign 07/06/2012  . Hyperlipemia 07/06/2012  . COLONIC POLYPS, HX OF 02/27/2009  . COLITIS 08/23/2008  . CARDIAC MURMUR 08/02/2008  . DIARRHEA 08/02/2008    Palliative Care Assessment & Plan   Patient Profile: 77 y.o. female  with past medical history of vulvar cancer s/p radiation and currently on chemo admitted on 08/02/2017 with worsening pain and discharge with concern for possible infection.   Recommendations/Plan:  Desires to continue with palliative chemotherapy.  She appreciates Dr. Alvy Bimler and her guidance regarding care plan moving forward.  Pain: Continue to use medications as needed.  She reports good control and declined changes today.  Will continue to review daily.  She may benefit from increase in her long acting agent as well as consider rotation to another rescue opioid if it appears likely that acetaminophen component is going to limit her medication usage.    Goals of Care and Additional Recommendations:  Limitations on Scope of  Treatment: Full Scope Treatment  Code Status:    Code  Status Orders  (From admission, onward)        Start     Ordered   08/03/17 0551  Full code  Continuous     08/03/17 0552    Code Status History    Date Active Date Inactive Code Status Order ID Comments User Context   05/15/2017 1350 05/16/2017 1356 Full Code [id number]  Everitt Amber, MD Inpatient   04/20/2017 1649 04/22/2017 1809 Full Code [id number]  Georgette Shell, MD ED   10/17/2016 1755 10/18/2016 1502 Full Code [id number]  Lahoma Crocker, MD Inpatient   05/03/2016 1808 05/07/2016 1619 Full Code [id number]  Truett Mainland, DO Inpatient   02/27/2016 1629 02/28/2016 1421 Full Code [id number]  Lahoma Crocker, MD Inpatient   10/27/2013 1632 10/29/2013 1706 Full Code [id number]  Tobi Bastos, MD Inpatient    Advance Directive Documentation     Most Recent Value  Type of Advance Directive  Living will  Pre-existing out of facility DNR order (yellow form or pink MOST form)  -  "MOST" Form in Place?  -       Prognosis:   Unable to determine  Discharge Planning:  To Be Determined  Care plan was discussed with patient, Dr. Posey Pronto  Thank you for allowing the Palliative Medicine Team to assist in the care of this patient.   Total Time 30 Prolonged Time Billed  no       Greater than 50%  of this time was spent counseling and coordinating care related to the above assessment and plan.  Micheline Rough, MD  Please contact Palliative Medicine Team phone at [phone] for questions and concerns.

## 2017-08-10 LAB — CBC WITH DIFFERENTIAL/PLATELET
Basophils Absolute: 0 10*3/uL (ref 0.0–0.1)
Basophils Relative: 0 %
Eosinophils Absolute: 0 10*3/uL (ref 0.0–0.7)
Eosinophils Relative: 0 %
HCT: 29.8 % — ABNORMAL LOW (ref 36.0–46.0)
Hemoglobin: 9.6 g/dL — ABNORMAL LOW (ref 12.0–15.0)
LYMPHS PCT: 18 %
Lymphs Abs: 0.7 10*3/uL (ref 0.7–4.0)
MCH: 28.7 pg (ref 26.0–34.0)
MCHC: 32.2 g/dL (ref 30.0–36.0)
MCV: 89.2 fL (ref 78.0–100.0)
MONO ABS: 0.3 10*3/uL (ref 0.1–1.0)
MONOS PCT: 7 %
NEUTROS ABS: 2.8 10*3/uL (ref 1.7–7.7)
Neutrophils Relative %: 75 %
Platelets: 20 10*3/uL — CL (ref 150–400)
RBC: 3.34 MIL/uL — ABNORMAL LOW (ref 3.87–5.11)
RDW: 16.3 % — AB (ref 11.5–15.5)
WBC: 3.7 10*3/uL — ABNORMAL LOW (ref 4.0–10.5)

## 2017-08-10 LAB — GLUCOSE, CAPILLARY
GLUCOSE-CAPILLARY: 99 mg/dL (ref 70–99)
GLUCOSE-CAPILLARY: 104 mg/dL — AB (ref 70–99)
Glucose-Capillary: 86 mg/dL (ref 70–99)
Glucose-Capillary: 99 mg/dL (ref 70–99)

## 2017-08-10 LAB — VITAMIN B12: VITAMIN B 12: 1746 pg/mL — AB (ref 180–914)

## 2017-08-10 MED ORDER — DOCUSATE SODIUM 100 MG PO CAPS: 100.0000 mg | ORAL_CAPSULE | Freq: Two times a day (BID) | ORAL | 0 refills | Status: AC

## 2017-08-10 MED ORDER — PRO-STAT SUGAR FREE PO LIQD: 30.0000 mL | Freq: Two times a day (BID) | ORAL | 0 refills | Status: AC

## 2017-08-10 MED ORDER — HEPARIN SOD (PORK) LOCK FLUSH 100 UNIT/ML IV SOLN
250.0000 [IU] | INTRAVENOUS | Status: AC | PRN
  Administered 2017-08-10: 250 [IU]

## 2017-08-10 MED ORDER — POLYETHYLENE GLYCOL 3350 17 G PO PACK: 17.0000 g | PACK | Freq: Every day | ORAL | 0 refills | Status: AC

## 2017-08-10 NOTE — Care Management Note (Signed)
Case Management Note  Patient Details  Name: [name] MRN: [id number] Date of Birth: 16-Jul-1940  Subjective/Objective:  Vulva Cancer                  Action/Plan: NCM spoke to pt and she is active with [facility] for [facility], PT and OT. Contacted [facility] with resumption of care. Pt has RW and bedside commode at home.  Expected Discharge Date:  08/10/17               Expected Discharge Plan:  [place]  In-House Referral:  Clinical Social Work  Discharge planning Services  CM Consult  Post Acute Care Choice:  Resumption of Svcs/PTA Provider, Home Health Choice offered to:  Patient  DME Arranged:  N/A DME Agency:  NA  HH Arranged:  RN, PT, OT HH Agency:  [place]  Status of Service:  Completed, signed off  If discussed at [place] of Stay Meetings, dates discussed:    Additional Comments:  Erenest Rasher, RN 08/10/2017, 4:52 PM

## 2017-08-10 NOTE — Progress Notes (Signed)
[name]   DOB:01/17/1941   RX#:[id number]    Assessment & Plan:   Vulva cancer [facility]) Ultimately, she is interested to pursue further palliative chemotherapy in the outpatient setting Continue supportive care  Multifactorialanemia She felt better today. She has received 2 units of blood transfusion on 08/04/2017 her wound healing will improve with blood transfusion support I recommend close monitoring of blood count while hospitalizedand transfusion as needed  Acute thrombocytopenia Likely due to recent chemotherapyand side effects of Unasyn She does not need platelet transfusion unless bleeding or platelet count less than10,000 I recommend continue close follow-up of blood count daily I have stopped antibiotics treatment Serum vitamin B12 is pending  Mild leukopenia Currently, she is not neutropenic We will monitor carefully  Diabetes mellitus with diabetic neuropathy, without long-term current use of insulin ([facility]) Her diabetes is well controlled with metformin and dietary modification She will continue the same  Severe protein calorie malnutrition I recommend formal dietitian consult for evaluation  Cancer associated pain She has recent uncontrolled pain  Her prescription pain medicine was increased to MS Contin 30 mg twice a dayalong with breakthrough pain medicine as needed She will continue pain management for now  Likelyvulva/wound infection She has completed 4 days of intravenous antibiotic therapy No further indication for antibiotics  Significant electrolyte imbalance, resolved Recommend close monitoring  Discharge planningand goals of care discussion She can be discharged today.  I will set up outpatient appointment early next week I have asked the patient to contact advanced home care service next week to resume home care wound dressing changes and follow-up.  We will keep the PICC line for now My scheduler will call his son directly   tomorrow for further follow-up She has enough prescription pain medicine at home  Melissa Lark, MD 08/10/2017  8:47 AM   Subjective:  She is feeling better.  Pain is well controlled. The patient denies any recent signs or symptoms of bleeding such as spontaneous epistaxis, hematuria or hematochezia.   Objective:  Vitals:   08/09/17 2037 08/10/17 0615  BP: 119/61 109/62  Pulse: 70 76  Resp: 17 16  Temp: 98.1 F (36.7 C) 98.1 F (36.7 C)  SpO2: 98% 95%     Intake/Output Summary (Last 24 hours) at 08/10/2017 0847 Last data filed at 08/10/2017 0600 Gross per 24 hour  Intake 10 ml  Output -  Net 10 ml    GENERAL:alert, no distress and comfortable SKIN: The vulval area looks about the same.  No active bleeding. NEURO: alert & oriented x 3 with fluent speech, no focal motor/sensory deficits   Labs:  Lab Results  Component Value Date   WBC 3.7 (L) 08/10/2017   HGB 9.6 (L) 08/10/2017   HCT 29.8 (L) 08/10/2017   MCV 89.2 08/10/2017   PLT 20 (LL) 08/10/2017   NEUTROABS 2.8 08/10/2017    Lab Results  Component Value Date   NA 142 08/07/2017   K 3.6 08/07/2017   CL 106 08/07/2017   CO2 29 08/07/2017

## 2017-08-10 NOTE — Evaluation (Signed)
Physical Therapy Evaluation Patient Details Name: [name] MRN: [id number] DOB: [birth year] Today's Date: 08/10/2017   History of Present Illness  pt was admitted wtih vulvar pain; h/o vulvar CA  Clinical Impression  The patient was pleased that she ambulated x 400' today.  Plans Dc home. Pt admitted with above diagnosis. Pt currently with functional limitations due to the deficits listed below (see PT Problem List).  Pt will benefit from skilled PT to increase their independence and safety with mobility to allow discharge to the venue listed below.       Follow Up Recommendations Home health PT    Equipment Recommendations  None recommended by PT    Recommendations for Other Services       Precautions / Restrictions Precautions Precautions: Fall      Mobility  Bed Mobility Overal bed mobility: Needs Assistance Bed Mobility: Supine to Sit     Supine to sit: Min guard     General bed mobility comments: min guard and extra time to get OOB; used bed rails  Transfers Overall transfer level: Needs assistance Equipment used: Rolling walker (2 wheeled) Transfers: Sit to/from Stand Sit to Stand: Min guard         General transfer comment: for safety  Ambulation/Gait Ambulation/Gait assistance: Min guard Gait Distance (Feet): 400 Feet Assistive device: Rolling walker (2 wheeled) Gait Pattern/deviations: Step-through pattern     General Gait Details: several stops for  rest while ambulating.  Stairs            Wheelchair Mobility    Modified Rankin (Stroke Patients Only)       Balance                                             Pertinent Vitals/Pain Pain Assessment: Faces Faces Pain Scale: Hurts even more Pain Location: periarea when washing up Pain Descriptors / Indicators: Burning;Discomfort;Grimacing Pain Intervention(s): Patient requesting pain meds-RN notified    Home Living Family/patient expects to be discharged  to:: Private residence Living Arrangements: Spouse/significant other Available Help at Discharge: Family Type of Home: House Home Access: Stairs to enter Entrance Stairs-Rails: None Entrance Stairs-Number of Steps: 3 Home Layout: One level Home Equipment: Walker - 2 wheels;Crutches;Cane - single point;Shower seat Additional Comments: husband on 02 and uses RW. Son lives behind her; owns his own businee    Prior Function Level of Independence: Independent               Journalist, newspaper        Extremity/Trunk Assessment   Upper Extremity Assessment Upper Extremity Assessment: Generalized weakness    Lower Extremity Assessment Lower Extremity Assessment: Generalized weakness    Cervical / Trunk Assessment Cervical / Trunk Assessment: Normal  Communication   Communication: No difficulties  Cognition Arousal/Alertness: Awake/alert Behavior During Therapy: WFL for tasks assessed/performed Overall Cognitive Status: Within Functional Limits for tasks assessed                                        General Comments      Exercises     Assessment/Plan    PT Assessment Patient needs continued PT services  PT Problem List Decreased strength;Decreased activity tolerance;Decreased mobility;Decreased knowledge of precautions;Decreased safety awareness;Decreased knowledge of use of DME;Pain  PT Treatment Interventions DME instruction;Gait training;Functional mobility training;Therapeutic activities;Patient/family education    PT Goals (Current goals can be found in the Care Plan section)  Acute Rehab PT Goals Patient Stated Goal: none stated PT Goal Formulation: With patient Time For Goal Achievement: 08/24/17 Potential to Achieve Goals: Good    Frequency Min 3X/week   Barriers to discharge        Co-evaluation               AM-PAC PT "6 Clicks" Daily Activity  Outcome Measure Difficulty turning over in bed (including adjusting  bedclothes, sheets and blankets)?: A Little Difficulty moving from lying on back to sitting on the side of the bed? : A Little Difficulty sitting down on and standing up from a chair with arms (e.g., wheelchair, bedside commode, etc,.)?: A Little Help needed moving to and from a bed to chair (including a wheelchair)?: A Little Help needed walking in hospital room?: A Lot Help needed climbing 3-5 steps with a railing? : Total 6 Click Score: 15    End of Session   Activity Tolerance: Patient tolerated treatment well Patient left: in chair Nurse Communication: Mobility status PT Visit Diagnosis: Unsteadiness on feet (R26.81);Pain    Time: 5189-8421 PT Time Calculation (min) (ACUTE ONLY): 31 min   Charges:   PT Evaluation $PT Eval Low Complexity: 1 Low PT Treatments $Gait Training: 8-22 mins   PT G CodesTresa Endo PT [phone]   Claretha Cooper 08/10/2017, 1:53 PM

## 2017-08-11 ENCOUNTER — Telehealth: Payer: Self-pay | Admitting: Hematology and Oncology

## 2017-08-11 ENCOUNTER — Other Ambulatory Visit (HOSPITAL_COMMUNITY)
Admission: RE | Admit: 2017-08-11 | Discharge: 2017-08-11 | Disposition: A | Discharge status: Home / Self Care | Payer: PPO | Source: Ambulatory Visit | Attending: Gynecologic Oncology | Admitting: Gynecologic Oncology

## 2017-08-11 ENCOUNTER — Telehealth: Payer: Self-pay | Admitting: Oncology

## 2017-08-11 ENCOUNTER — Encounter: Payer: Self-pay | Admitting: Gynecologic Oncology

## 2017-08-11 ENCOUNTER — Other Ambulatory Visit: Payer: Self-pay | Admitting: Family Medicine

## 2017-08-11 DIAGNOSIS — C519 Malignant neoplasm of vulva, unspecified: Secondary | ICD-10-CM | POA: Insufficient documentation

## 2017-08-11 NOTE — Telephone Encounter (Signed)
Called pt's son re appts that were added per 7/21 sch msg - spoke and confirmed appts

## 2017-08-11 NOTE — Progress Notes (Signed)
Gynecologic Oncology Multi-Disciplinary Disposition Conference Note  Date of the Conference: August 11, 2017  Patient Name: [name]  Referring Provider: Dr. Elonda Husky Primary GYN Oncologist: Dr. Everitt Amber  Stage/Disposition:  Recurrent vulvar cancer.  Plan for PDL1 testing.  Possibility for additional chemotherapy per Dr. Alvy Bimler.    This Multidisciplinary conference took place involving physicians from [place], [place], Radiation Oncology, Pathology, Radiology along with the Gynecologic Oncology Nurse Practitioner and RN.  Comprehensive assessment of the patient's malignancy, staging, need for surgery, chemotherapy, radiation therapy, and need for further testing were reviewed. Supportive measures, both inpatient and following discharge were also discussed. The recommended plan of care is documented. Greater than 35 minutes were spent correlating and coordinating this patient's care.

## 2017-08-11 NOTE — Telephone Encounter (Signed)
Requested PD-L1 testing on accession: [id number] with Suanne Marker in [facility] Pathology.

## 2017-08-12 ENCOUNTER — Other Ambulatory Visit: Payer: Self-pay | Admitting: Hematology and Oncology

## 2017-08-12 DIAGNOSIS — C519 Malignant neoplasm of vulva, unspecified: Secondary | ICD-10-CM

## 2017-08-12 DIAGNOSIS — D509 Iron deficiency anemia, unspecified: Secondary | ICD-10-CM

## 2017-08-13 ENCOUNTER — Telehealth: Payer: Self-pay | Admitting: Oncology

## 2017-08-13 ENCOUNTER — Encounter: Payer: Self-pay | Admitting: Oncology

## 2017-08-13 ENCOUNTER — Ambulatory Visit: Payer: PPO | Admitting: Family Medicine

## 2017-08-13 ENCOUNTER — Inpatient Hospital Stay: Payer: PPO

## 2017-08-13 ENCOUNTER — Telehealth: Payer: Self-pay | Admitting: *Deleted

## 2017-08-13 ENCOUNTER — Inpatient Hospital Stay (HOSPITAL_BASED_OUTPATIENT_CLINIC_OR_DEPARTMENT_OTHER): Payer: PPO | Admitting: Hematology and Oncology

## 2017-08-13 ENCOUNTER — Telehealth: Payer: Self-pay | Admitting: Hematology and Oncology

## 2017-08-13 ENCOUNTER — Encounter: Payer: Self-pay | Admitting: Hematology and Oncology

## 2017-08-13 VITALS — BP 112/71 | HR 107 | Temp 98.4°F | Resp 18 | Ht 63.0 in | Wt 171.4 lb

## 2017-08-13 DIAGNOSIS — N764 Abscess of vulva: Secondary | ICD-10-CM | POA: Diagnosis not present

## 2017-08-13 DIAGNOSIS — D61810 Antineoplastic chemotherapy induced pancytopenia: Secondary | ICD-10-CM

## 2017-08-13 DIAGNOSIS — E1140 Type 2 diabetes mellitus with diabetic neuropathy, unspecified: Secondary | ICD-10-CM

## 2017-08-13 DIAGNOSIS — Z452 Encounter for adjustment and management of vascular access device: Secondary | ICD-10-CM

## 2017-08-13 DIAGNOSIS — Z483 Aftercare following surgery for neoplasm: Secondary | ICD-10-CM | POA: Diagnosis not present

## 2017-08-13 DIAGNOSIS — G893 Neoplasm related pain (acute) (chronic): Secondary | ICD-10-CM

## 2017-08-13 DIAGNOSIS — C519 Malignant neoplasm of vulva, unspecified: Secondary | ICD-10-CM

## 2017-08-13 DIAGNOSIS — D61818 Other pancytopenia: Secondary | ICD-10-CM

## 2017-08-13 DIAGNOSIS — Z79891 Long term (current) use of opiate analgesic: Secondary | ICD-10-CM | POA: Diagnosis not present

## 2017-08-13 DIAGNOSIS — D509 Iron deficiency anemia, unspecified: Secondary | ICD-10-CM | POA: Diagnosis not present

## 2017-08-13 DIAGNOSIS — Z5189 Encounter for other specified aftercare: Secondary | ICD-10-CM | POA: Diagnosis not present

## 2017-08-13 DIAGNOSIS — Z96642 Presence of left artificial hip joint: Secondary | ICD-10-CM | POA: Diagnosis not present

## 2017-08-13 DIAGNOSIS — Z7984 Long term (current) use of oral hypoglycemic drugs: Secondary | ICD-10-CM | POA: Diagnosis not present

## 2017-08-13 DIAGNOSIS — Z7189 Other specified counseling: Secondary | ICD-10-CM

## 2017-08-13 DIAGNOSIS — Z96651 Presence of right artificial knee joint: Secondary | ICD-10-CM | POA: Diagnosis not present

## 2017-08-13 DIAGNOSIS — Z5111 Encounter for antineoplastic chemotherapy: Secondary | ICD-10-CM | POA: Diagnosis not present

## 2017-08-13 LAB — CBC WITH DIFFERENTIAL (CANCER CENTER ONLY)
Basophils Absolute: 0 10*3/uL (ref 0.0–0.1)
Basophils Relative: 0 %
EOS ABS: 0 10*3/uL (ref 0.0–0.5)
EOS PCT: 0 %
HCT: 32.3 % — ABNORMAL LOW (ref 34.8–46.6)
HEMOGLOBIN: 10.4 g/dL — AB (ref 11.6–15.9)
LYMPHS ABS: 0.4 10*3/uL — AB (ref 0.9–3.3)
LYMPHS PCT: 8 %
MCH: 28.7 pg (ref 25.1–34.0)
MCHC: 32.2 g/dL (ref 31.5–36.0)
MCV: 89.2 fL (ref 79.5–101.0)
MONOS PCT: 12 %
Monocytes Absolute: 0.6 10*3/uL (ref 0.1–0.9)
Neutro Abs: 3.9 10*3/uL (ref 1.5–6.5)
Neutrophils Relative %: 80 %
PLATELETS: 54 10*3/uL — AB (ref 145–400)
RBC: 3.62 MIL/uL — AB (ref 3.70–5.45)
RDW: 17.1 % — ABNORMAL HIGH (ref 11.2–14.5)
WBC Count: 4.8 10*3/uL (ref 3.9–10.3)

## 2017-08-13 LAB — COMPREHENSIVE METABOLIC PANEL
ALK PHOS: 84 U/L (ref 38–126)
ALT: 7 U/L (ref 0–44)
ANION GAP: 10 (ref 5–15)
AST: 12 U/L — ABNORMAL LOW (ref 15–41)
Albumin: 3.5 g/dL (ref 3.5–5.0)
BUN: 11 mg/dL (ref 8–23)
CO2: 29 mmol/L (ref 22–32)
CREATININE: 0.76 mg/dL (ref 0.44–1.00)
Calcium: 10 mg/dL (ref 8.9–10.3)
Chloride: 103 mmol/L (ref 98–111)
Glucose, Bld: 107 mg/dL — ABNORMAL HIGH (ref 70–99)
Potassium: 3.9 mmol/L (ref 3.5–5.1)
SODIUM: 142 mmol/L (ref 135–145)
TOTAL PROTEIN: 6.8 g/dL (ref 6.5–8.1)
Total Bilirubin: 0.5 mg/dL (ref 0.3–1.2)

## 2017-08-13 LAB — IRON AND TIBC
IRON: 40 ug/dL — AB (ref 41–142)
SATURATION RATIOS: 16 % — AB (ref 21–57)
TIBC: 245 ug/dL (ref 236–444)
UIBC: 206 ug/dL

## 2017-08-13 LAB — FERRITIN: FERRITIN: 577 ng/mL — AB (ref 11–307)

## 2017-08-13 LAB — MAGNESIUM: Magnesium: 1.3 mg/dL — CL (ref 1.7–2.4)

## 2017-08-13 LAB — SAMPLE TO BLOOD BANK

## 2017-08-13 MED ORDER — HYDROCODONE-ACETAMINOPHEN 10-325 MG PO TABS: 1.0000 | ORAL_TABLET | ORAL | 0 refills | Status: DC | PRN

## 2017-08-13 MED ORDER — MORPHINE SULFATE ER 30 MG PO TBCR: 30.0000 mg | EXTENDED_RELEASE_TABLET | Freq: Two times a day (BID) | ORAL | 0 refills | Status: DC

## 2017-08-13 MED ORDER — SODIUM CHLORIDE 0.9% FLUSH
10.0000 mL | INTRAVENOUS | Status: DC | PRN
  Administered 2017-08-13: 10 mL via INTRAVENOUS
  Filled 2017-08-13: qty 10

## 2017-08-13 MED ORDER — HEPARIN SOD (PORK) LOCK FLUSH 100 UNIT/ML IV SOLN
500.0000 [IU] | Freq: Once | INTRAVENOUS | Status: AC
  Administered 2017-08-13: 500 [IU] via INTRAVENOUS
  Filled 2017-08-13: qty 5

## 2017-08-13 MED ORDER — MAGNESIUM OXIDE 400 (241.3 MG) MG PO TABS: 400.0000 mg | ORAL_TABLET | Freq: Two times a day (BID) | ORAL | 6 refills | Status: DC

## 2017-08-13 NOTE — Telephone Encounter (Signed)
Gave avs and calendar ° °

## 2017-08-13 NOTE — Progress Notes (Signed)
DISCONTINUE OFF PATHWAY REGIMEN - [Other Dx]   [id number]:Carboplatin + Paclitaxel (5/175) q21 Days:   A cycle is every 21 days:     Paclitaxel      Carboplatin   **Always confirm dose/schedule in your pharmacy ordering system**  REASON: Disease Progression PRIOR TREATMENT: Carboplatin + Paclitaxel (5/175) q21 Days TREATMENT RESPONSE: Progressive Disease (PD)  START OFF PATHWAY REGIMEN - [Other Dx]   [id number]:Cisplatin 35 mg/m2 q7 Days + RT:   A cycle is every 7 days:     Cisplatin   **Always confirm dose/schedule in your pharmacy ordering system**  Administration Notes: cisplatin only, no radiation  Patient Characteristics: Intent of Therapy: Non-Curative / Palliative Intent, Discussed with Patient

## 2017-08-13 NOTE — Telephone Encounter (Signed)
Patient's son called and said [name] was supposed to call in her list of medications to Dr. Alvy Bimler this afternoon.  He is wondering if she can call tomorrow because she is worn out this afternoon.  Advised him to have her call in the morning.

## 2017-08-13 NOTE — Telephone Encounter (Signed)
Pt notified of message below. Verbalized understanding 

## 2017-08-13 NOTE — Progress Notes (Signed)
[facility] OFFICE PROGRESS NOTE  Patient Care Team: Mikey Kirschner, MD as PCP - General (Family Medicine)  ASSESSMENT & PLAN:  Vulva cancer ([place]) Overall, she has symptomatic improvement since discharge from the hospital Her blood counts are improving nicely We discussed the risk, benefits, side effects of pursuing palliative chemotherapy We discussed choices including single agent cisplatin versus immunotherapy versus tyrosine kinase inhibitor Ultimately, she has elected for single agent cisplatin I will keep the dose at 35 mg/m weekly She will come back 2 days before each treatment for blood count monitoring and supportive care  Cancer associated pain Her cancer pain is stable I refilled her prescription pain medicine I warned her about risk of nausea and constipation  Pancytopenia, acquired ([place]) She has acquired pancytopenia due to recent chemo She does not need blood transfusion or platelet transfusion today  Goals of care, counseling/discussion We have extensive goals of care discussion The patient understood that treatment goal is palliative The patient desire aggressive supportive care along with treatment if possible.   Orders Placed This Encounter  Procedures  . CBC with Differential (Cancer Center Only)    Standing Status:   Future    Number of Occurrences:   1    Standing Expiration Date:   08/14/2018  . Comprehensive metabolic panel    Standing Status:   Future    Number of Occurrences:   1    Standing Expiration Date:   08/14/2018  . Comprehensive metabolic panel    Standing Status:   Standing    Number of Occurrences:   33    Standing Expiration Date:   08/14/2018  . CBC with Differential/Platelet    Standing Status:   Standing    Number of Occurrences:   33    Standing Expiration Date:   08/14/2018  . Magnesium    Standing Status:   Standing    Number of Occurrences:   33    Standing Expiration Date:   08/14/2018    INTERVAL  HISTORY: Please see below for problem oriented charting. She returns with her son for further follow-up She felt better since discharge from the hospital Her wound is stable Her pain is well controlled Her appetite is stable She denies recent fever or chills The patient denies any recent signs or symptoms of bleeding such as spontaneous epistaxis, hematuria or hematochezia.  SUMMARY OF ONCOLOGIC HISTORY:   Vulva cancer ([place])   02/22/2016 Pathology Results    Labium, biopsy, right mid - INVASIVE SQUAMOUS CELL CARCINOMA.      02/23/2016 Imaging    Ct abdomen and pelvis Mild asymmetric soft tissue thickening in the right vulvar/perineal region, likely corresponding to the patient's known vulvar cancer, poorly evaluated on CT.  No evidence of metastatic disease.  Status post hysterectomy.      02/27/2016 Pathology Results    1. Lymph nodes, regional resection, right inguinal - METASTATIC CARCINOMA IN 1 OF 6 LYMPH NODES (1/6). 2. Vulva, excision, right - INVASIVE KERATINIZING SQUAMOUS CELL CARCINOMA, MODERATELY DIFFERENTIATED, SPANNING 2.1 CM WITH A DEPTH OF INVASION OF 0.6 CM. - THE SURGICAL RESECTION MARGIN ARE NEGATIVE FOR CARCINOMA. - DIFFUSE LICHEN SCLEROSUS ET ATROPHICUS. - SEE ONCOLOGY TABLE BELOW. Microscopic Comment 2. VULVA Specimen: Right vulvectomy. Procedure: Resection Lymph node sampling performed: Yes. Tumor site: Right vulva Tumor focality: Carcinoma is unifocal. Maximum tumor size (cm): 2.1 cm (gross measurement). Histologic type: Keratinizing squmaous cell carcinoma. Grade: Moderately differentiated. Depth of stromal invasion (mm): 6 mm (glass  slide measurement) Margins: Negative for carcinoma. Distance of carcinoma from nearest margin: 0.6 cm to the 3 o'clock margin. Lymph - Vascular invasion: Not identified. Lymph nodes: number examined: 6 Number positive: 1 size of metastatic focus: 0.3 cm (glass slide measurement) extracapsular extension: Not  identified. TNM code: pT1b, pN1a FIGO Stage (based on pathologic findings, needs clinical correlation): IIIA Comment: Grossly, there is a 2.1 cm sessile papular lesion which on histologic evaluation reveals invasive keratinizing squamous cell carcinoma invading to a depth of 0.6 cm as measured from a glass slide. In addition, there is diffuse lichen sclerosus et atrophicus. The surgical resection margins are negative for invasive caricnoma. Additional studies can be performed upon clinical request.      02/27/2016 Surgery    Surgery: Right radical partial vulvectomy, right inguinal lymph node dissection  Surgeons:  Imagene Gurney A. Alycia Rossetti, MD; Lahoma Crocker, MD   Pathology: Right vulva with resection margin with suture at 12:00. Right inguinal nodes.   Operative findings: 1.5 cm fungating erosive lesion of the right vulva approximately 2 cm from the urethra. Evidence of lichen sclerosis throughout the entire vulvar. No other ulcerative lesion suspicious for vulvar carcinoma. Shotty palpable lymphadenopathy in the right inguinal space.        04/01/2016 Imaging    Ct chest 1. Multiple pulmonary nodules as detailed above, generally in the 4 mm and less average diameter range although with 1 right lower lobe subpleural nodule measuring about 8 mm in diameter. These likely warrant surveillance. 2. Hypodense right thyroid nodule, 1.1 cm in long axis. Consider further evaluation with thyroid ultrasound. If patient is clinically hyperthyroid, consider nuclear medicine thyroid uptake and scan. 3. Stable hypodense lateral splenic lesion, likely a cyst. 4. Aortic and coronary atherosclerosis.       04/02/2016 Pathology Results    Labium, biopsy, left minora INVASIVE SQUAMOUS CELL CARCINOMA      04/09/2016 Pathology Results    1. Vulva, vulvectomy, left - INVASIVE SQUAMOUS CELL CARCINOMA, NON-KERATINIZING, SPANNING 2.4 CM IN WIDTH X 0.4 CM IN DEPTH. - CARCINOMA IS BROADLY PRESENT AT THE 6  O'CLOCK MARGIN OF SPECIMEN #1. - SEE ONCOLOGY TABLE BELOW. 2. Vulva, excision, lateral margin - SQUAMOUS LINED EPITHELIUM WITH LICHEN SCLEROSUS ET ATROPHICUS. 3. Vulva, excision, medial margin - SQUAMOUS LINED EPITHELIUM WITH LICHEN SCLEROSUS ET ATROPHICUS. Microscopic Comment 1. VULVA Specimen: Left vulva. Procedure: Left vulvectomy with additional lateral and medial margins. Lymph node sampling performed: Not on current case. Tumor site: Left vulva. Tumor focality: Unifocal. Maximum tumor size (cm): 2.4 cm in width (gross measurement) Histologic type: Squamous cell carcinoma, non-keratinizing. Grade: High grade. Depth of stromal invasion (mm): 0.4 cm (4 mm), glass slide measurement. Margins: Invasive carcinoma broadly present at the 6 o'clock margin of specimen #1. Lymph - Vascular invasion: Not identified. Lymph nodes: None examined. TNM code: pT1b, pNX FIGO Stage (based on pathologic findings, needs clinical correlation): IB      04/09/2016 Surgery    Surgery: Partial radical left vulvectomy  Surgeons:  Donaciano Eva, MD  Pathology: 1/ left vulva with marking stitch at 12 o'clock anterior, 2/ lateral (thigh) margin with stitch at true new lateral margin. 3/ medial (vaginal) margin with marking stitch at true new medial margin. Operative findings: 2cm left mid labial nodular lesion        05/03/2016 Imaging    Ct chest 1.  No evidence of acute pulmonary embolus. 2. Calcified coronary artery atherosclerosis. Negative visible aorta. 3. Pulmonary atelectasis with no other acute pulmonary process.  The small subpleural pulmonary nodules seen by CT last month are stable.       06/05/2016 - 07/25/2016 Radiation Therapy    Radiation treatment dates:   06/05/16 - 07/25/16  Site/dose:   Pelvis treated to 45 Gy in 25 fractions, then vulvar region Boosted an additional 18 Gy in 9 fractions.  Beams/energy:   Pelvis:  IMRT  //  6X                              Boost:  IMRT   //  6X       09/30/2016 Pathology Results    Vulva, biopsy, anterior vulva INVASIVE SQUAMOUS CELL CARCINOMA      10/07/2016 Imaging    Ct chest 1. Stable bilateral pulmonary nodules, likely benign but will require surveillance. Recommend followup noncontrast chest CT in 6-12 months. 2. No mediastinal or hilar mass or adenopathy. 3. Stable coronary artery calcifications.  Aortic Atherosclerosis (ICD10-I70.0).      10/17/2016 Pathology Results    Vulva, vulvectomy, anterior - INVASIVE SQUAMOUS CELL CARCINOMA, SPANNING 2.7 CM AND EXTENDING TO A DEPTH OF 0.3 CM. - RESECTION MARGINS ARE NEGATIVE. - SEE ONCOLOGY TABLE. Microscopic Comment VULVA Specimen: Anterior vulva. Procedure: Partial anterior vulvectomy. Lymph node sampling performed: None. Tumor site: Anterior vulva. Tumor focality: Unifocal. Maximum tumor size (cm): 2.7 cm. Histologic type: Squamous cell carcinoma, non-keratinizing. Grade: G2, moderately differentiated. Depth of stromal invasion (mm): 3 mm. Margins: Negative Distance of carcinoma from nearest margin: Invasive tumor is 1 mm from urethral margin. Lymph - Vascular invasion: Not identified. Lymph nodes: None. TNM code: pT1b, pNX FIGO Stage (based on pathologic findings, needs clinical correlation): IB      10/17/2016 Surgery    Surgery: Partial radical anterior vulvectomy  Surgeons:  Donaciano Eva, MD  Pathology: anterior vulva with marking stitch at anterior clitoral margin o'clock  Operative findings: 3cm raised vulvar cancer (leukoplakia) at midline and right vulva extending to within 0.5cm of anterior urethral meatus        02/17/2017 Pathology Results    Vulva, biopsy - PARAKERATOSIS AND ACUTE INFLAMMATION. - PAS IS NEGATIVE FOR FUNGAL ORGANISMS. - NO DYSPLASIA OR MALIGNANCY.      05/01/2017 Imaging    Ct abdomen and pelvis New 4 x 3 cm rim enhancing lesion central air bubbles in the right vulvar region, which could represent  locally recurrent tumor with necrosis or abscess.  No evidence of metastatic disease within the abdomen or pelvis.      05/15/2017 Pathology Results    1. Labium, biopsy, right posterior labia majora - PARAKERATOSIS. - NO DYSPLASIA OR MALIGNANCY. 2. Vulva, excision, anterior right mons pubis lesion - INVASIVE MODERATE TO POORLY DIFFERENTIATED SQUAMOUS CELL CARCINOMA. - ASSOCIATED ULCER, NECROSIS AND ACUTE INFLAMMATION. - SEE COMMENT. 3. Labium, biopsy, left mid labia majora - POORLY DIFFERENTIATED SQUAMOUS CELL CARCINOMA. - SEE COMMENT. Microscopic Comment 2. The specimen is disrupted hampering assessment of margin orientation and size, however, carcinoma involves almost the entire specimen (thus size estimated to be at least 3.1 cm with depth of 1 cm) and extends to the majority of the presumed lateral and deep margins. There is perineural invasion. 3. The carcinoma is largely beneath the epidermis and extends to the deep and lateral edges of the biopsy.      05/15/2017 Surgery    Surgery: debridement of anterior vulvar abscess, vulvar biopsies.   Surgeons:  Donaciano Eva, MD  Pathology: right posterior labia majora, anterior mons pubis, left mid labia majora  Operative findings: 6cm cavitating anterior mons pubis indurated lesion. It is approximately 3.5cm from the urethral meatus to inferior aspect of mass. Two indurated 1-2cm nodular areas (one on the posterior right labia majora and one on the mid left labia majora). No periurethral lesions.        06/02/2017 PET scan    Stable hypermetabolic soft tissue density in right vulva, which may be due to postoperative change from recent excision, although residual malignancy and infection cannot be excluded.  No evidence of local or distant metastatic disease.      06/06/2017 Procedure    Successful right arm power PICC line placement with ultrasound and fluoroscopic guidance. The catheter is ready for use.       06/12/2017 Cancer Staging    Staging form: Vulva, AJCC 8th Edition - Clinical: Stage II (cT2, cN0, cM0) - Signed by Heath Lark, MD on 06/12/2017      06/13/2017 - 07/25/2017 Chemotherapy    The patient had carboplatin and Taxol      08/02/2017 - 08/10/2017 Hospital Admission    She was hospitalized due to severe vulvar pain and infection.      08/03/2017 Imaging    Overall there has been progression of disease involving bilateral vulva. On the right this demonstrates progressive anterior involvement, ulceration and extension into the mons pubis. This also demonstrates progressive posterior extension into the medial upper thigh.       REVIEW OF SYSTEMS:   Constitutional: Denies fevers, chills or abnormal weight loss Eyes: Denies blurriness of vision Ears, nose, mouth, throat, and face: Denies mucositis or sore throat Respiratory: Denies cough, dyspnea or wheezes Cardiovascular: Denies palpitation, chest discomfort or lower extremity swelling Gastrointestinal:  Denies nausea, heartburn or change in bowel habits Lymphatics: Denies new lymphadenopathy or easy bruising Neurological:Denies numbness, tingling or new weaknesses Behavioral/Psych: Mood is stable, no new changes  All other systems were reviewed with the patient and are negative.  I have reviewed the past medical history, past surgical history, social history and family history with the patient and they are unchanged from previous note.  ALLERGIES:  is allergic to oxycodone; celebrex [celecoxib]; doxycycline; iron; lodine [etodolac]; nabumetone; tetracycline; and xarelto [rivaroxaban].  MEDICATIONS:  Current Outpatient Medications  Medication Sig Dispense Refill  . Amino Acids-Protein Hydrolys (FEEDING SUPPLEMENT, PRO-STAT SUGAR FREE 64,) LIQD Take 30 mLs by mouth 2 (two) times daily. 900 mL 0  . blood glucose meter kit and supplies KIT Dispense based on patient and insurance preference. Use up to three times daily as directed  (FOR ICD-10 E11.9). 1 each 0  . collagenase (SANTYL) ointment Apply 1 application topically daily. Apply to vulva wound bed once daily with dressing changes (Patient not taking: Reported on 08/03/2017) 15 g 1  . docusate sodium (COLACE) 100 MG capsule Take 1 capsule (100 mg total) by mouth 2 (two) times daily. 10 capsule 0  . ferrous sulfate 325 (65 FE) MG tablet Take 1 tablet (325 mg total) by mouth daily with breakfast. 30 tablet 1  . gabapentin (NEURONTIN) 100 MG capsule TAKE 1 CAPSULE BY MOUTH THREE TIMES A DAY. 270 capsule 1  . HYDROcodone-acetaminophen (NORCO) 10-325 MG tablet Take 1 tablet by mouth every 4 (four) hours as needed. 90 tablet 0  . metFORMIN (GLUCOPHAGE) 500 MG tablet TAKE (1) TABLET BY MOUTH TWICE DAILY. 180 tablet 0  . morphine (MS CONTIN) 30 MG 12 hr tablet Take 1  tablet (30 mg total) by mouth every 12 (twelve) hours. 60 tablet 0  . polyethylene glycol (MIRALAX / GLYCOLAX) packet Take 17 g by mouth daily. 14 each 0  . sodium chloride flush 0.9 % SOLN injection Inject 10 mLs into the vein daily. 30 Syringe 9   Current Facility-Administered Medications  Medication Dose Route Frequency Provider Last Rate Last Dose  . sodium chloride flush (NS) 0.9 % injection 10 mL  10 mL Intravenous PRN Heath Lark, MD   10 mL at 08/13/17 1119    PHYSICAL EXAMINATION: ECOG PERFORMANCE STATUS: 2 - Symptomatic, <50% confined to bed  Vitals:   08/13/17 0955  BP: 112/71  Pulse: (!) 107  Resp: 18  Temp: 98.4 F (36.9 C)  SpO2: 99%   Filed Weights   08/13/17 0955  Weight: 171 lb 6.4 oz (77.7 kg)    GENERAL:alert, no distress and comfortable Musculoskeletal:no cyanosis of digits and no clubbing  NEURO: alert & oriented x 3 with fluent speech, no focal motor/sensory deficits  LABORATORY DATA:  I have reviewed the data as listed    Component Value Date/Time   NA 142 08/13/2017 1115   NA 144 09/30/2016 1505   K 3.9 08/13/2017 1115   K 4.4 09/30/2016 1505   CL 103 08/13/2017 1115    CO2 29 08/13/2017 1115   CO2 27 09/30/2016 1505   GLUCOSE 107 (H) 08/13/2017 1115   GLUCOSE 101 09/30/2016 1505   BUN 11 08/13/2017 1115   BUN 16.7 09/30/2016 1505   CREATININE 0.76 08/13/2017 1115   CREATININE 0.88 07/04/2017 0821   CREATININE 0.8 09/30/2016 1505   CALCIUM 10.0 08/13/2017 1115   CALCIUM 9.8 09/30/2016 1505   PROT 6.8 08/13/2017 1115   PROT 7.3 09/30/2016 1505   ALBUMIN 3.5 08/13/2017 1115   ALBUMIN 3.6 09/30/2016 1505   AST 12 (L) 08/13/2017 1115   AST 12 07/04/2017 0821   AST 27 09/30/2016 1505   ALT 7 08/13/2017 1115   ALT 9 07/04/2017 0821   ALT 26 09/30/2016 1505   ALKPHOS 84 08/13/2017 1115   ALKPHOS 76 09/30/2016 1505   BILITOT 0.5 08/13/2017 1115   BILITOT 0.3 07/04/2017 0821   BILITOT 0.49 09/30/2016 1505   GFRNONAA >60 08/13/2017 1115   GFRNONAA >60 07/04/2017 0821   GFRAA >60 08/13/2017 1115   GFRAA >60 07/04/2017 0821    No results found for: SPEP, UPEP  Lab Results  Component Value Date   WBC 4.8 08/13/2017   NEUTROABS 3.9 08/13/2017   HGB 10.4 (L) 08/13/2017   HCT 32.3 (L) 08/13/2017   MCV 89.2 08/13/2017   PLT 54 (L) 08/13/2017      Chemistry      Component Value Date/Time   NA 142 08/13/2017 1115   NA 144 09/30/2016 1505   K 3.9 08/13/2017 1115   K 4.4 09/30/2016 1505   CL 103 08/13/2017 1115   CO2 29 08/13/2017 1115   CO2 27 09/30/2016 1505   BUN 11 08/13/2017 1115   BUN 16.7 09/30/2016 1505   CREATININE 0.76 08/13/2017 1115   CREATININE 0.88 07/04/2017 0821   CREATININE 0.8 09/30/2016 1505      Component Value Date/Time   CALCIUM 10.0 08/13/2017 1115   CALCIUM 9.8 09/30/2016 1505   ALKPHOS 84 08/13/2017 1115   ALKPHOS 76 09/30/2016 1505   AST 12 (L) 08/13/2017 1115   AST 12 07/04/2017 0821   AST 27 09/30/2016 1505   ALT 7 08/13/2017 1115   ALT  9 07/04/2017 0821   ALT 26 09/30/2016 1505   BILITOT 0.5 08/13/2017 1115   BILITOT 0.3 07/04/2017 0821   BILITOT 0.49 09/30/2016 1505       RADIOGRAPHIC  STUDIES: I have personally reviewed the radiological images as listed and agreed with the findings in the report. Ct Pelvis W Contrast  Addendum Date: 08/04/2017   ADDENDUM REPORT: 08/04/2017 10:07 ADDENDUM: The following is a comparison to PET-CT from 06/02/2017. The ulcerative mass involving the bulb of and extending into the vulva measures 5.7 by 3.4 by 4.0 cm. On the previous exam this measured 3.7 x 2.9 by 3.0 cm. Additionally, there is progressive enhancing tumor which extends posteriorly into the right upper thigh with a maximum thickness of 0.9 cm, image 116/4. 0.7 cm. There is a enlarging enhancing nodule within the subcutaneous soft tissues of the left vulva measuring 1.2 x 1.0 cm. Previously this measured 0.7 cm in maximum diameter. More anteriorly within the left vulva is a enhancing mass measuring 1.2 x 2.6 cm, image 114/4. Previously this measured 1.6 x 1.2 cm. IMPRESSION: Overall there has been progression of disease involving bilateral vulva. On the right this demonstrates progressive anterior involvement, ulceration and extension into the mons pubis. This also demonstrates progressive posterior extension into the medial upper thigh. Electronically Signed   By: Kerby Moors M.D.   On: 08/04/2017 10:07   Result Date: 08/04/2017 CLINICAL DATA:  77 year old female with malignant neoplasm of the vulva. EXAM: CT PELVIS WITH CONTRAST TECHNIQUE: Multidetector CT imaging of the pelvis was performed using the standard protocol following the bolus administration of intravenous contrast. CONTRAST:  71m OMNIPAQUE IOHEXOL 300 MG/ML  SOLN COMPARISON:  CT of the abdomen pelvis dated 04/30/2017 FINDINGS: Urinary Tract: The visualized distal ureters and urinary bladder appear unremarkable. Bowel: There is sigmoid diverticulosis without active inflammatory changes. No dilated bowel in the pelvis. Vascular/Lymphatic: Mild atherosclerotic disease.  No adenopathy. Reproductive: Hysterectomy. There is a 3.6 x 2.9  cm nodular density in the skin and subcutaneous tissues of the right vulvar region. There is irregularity of the overlying skin which may represent ulceration. Multiple additional nodules noted in the volar region and extending into the perineal subcutaneous soft tissues and adjacent to the vaginal orifice and anterior to the rectum. For example there is a 2.6 x 1.2 cm nodular lesion over the left vulvar area and a 12 x 10 mm nodular lesion in the left perineum (series 4 image 112). There has been interval increase in the size of the vulvar lesion and development of new vulvar and perineal lesions compared to the prior CT. Other:  None Musculoskeletal: Total left hip arthroplasty. Osteopenia and degenerative changes of the spine. No acute osseous pathology. IMPRESSION: 1. Interval increase in the size and extent of vulvar mass with development of new vulvar and perineal lesions since the prior CT. 2. No evidence of metastatic disease within the pelvis. Electronically Signed: By: AAnner CreteM.D. On: 08/03/2017 04:28    All questions were answered. The patient knows to call the clinic with any problems, questions or concerns. No barriers to learning was detected.  I spent 40 minutes counseling the patient face to face. The total time spent in the appointment was 55 minutes and more than 50% was on counseling and review of test results  NHeath Lark MD 08/13/2017 12:45 PM

## 2017-08-13 NOTE — Assessment & Plan Note (Signed)
Overall, she has symptomatic improvement since discharge from the hospital Her blood counts are improving nicely We discussed the risk, benefits, side effects of pursuing palliative chemotherapy We discussed choices including single agent cisplatin versus immunotherapy versus tyrosine kinase inhibitor Ultimately, she has elected for single agent cisplatin I will keep the dose at 35 mg/m weekly She will come back 2 days before each treatment for blood count monitoring and supportive care

## 2017-08-13 NOTE — Assessment & Plan Note (Signed)
She has acquired pancytopenia due to recent chemo She does not need blood transfusion or platelet transfusion today

## 2017-08-13 NOTE — Assessment & Plan Note (Signed)
We have extensive goals of care discussion The patient understood that treatment goal is palliative The patient desire aggressive supportive care along with treatment if possible.

## 2017-08-13 NOTE — Patient Instructions (Signed)
Cisplatin injection  What is this medicine?  CISPLATIN (SIS pla tin) is a chemotherapy drug. It targets fast dividing cells, like cancer cells, and causes these cells to die. This medicine is used to treat many types of cancer like bladder, ovarian, and testicular cancers.  This medicine may be used for other purposes; ask your health care provider or pharmacist if you have questions.  COMMON BRAND NAME(S): Platinol, Platinol -AQ  What should I tell my health care provider before I take this medicine?  They need to know if you have any of these conditions:  -blood disorders  -hearing problems  -kidney disease  -recent or ongoing radiation therapy  -an unusual or allergic reaction to cisplatin, carboplatin, other chemotherapy, other medicines, foods, dyes, or preservatives  -pregnant or trying to get pregnant  -breast-feeding  How should I use this medicine?  This drug is given as an infusion into a vein. It is administered in a hospital or clinic by a specially trained health care professional.  Talk to your pediatrician regarding the use of this medicine in children. Special care may be needed.  Overdosage: If you think you have taken too much of this medicine contact a poison control center or emergency room at once.  NOTE: This medicine is only for you. Do not share this medicine with others.  What if I miss a dose?  It is important not to miss a dose. Call your doctor or health care professional if you are unable to keep an appointment.  What may interact with this medicine?  -dofetilide  -foscarnet  -medicines for seizures  -medicines to increase blood counts like filgrastim, pegfilgrastim, sargramostim  -probenecid  -pyridoxine used with altretamine  -rituximab  -some antibiotics like amikacin, gentamicin, neomycin, polymyxin B, streptomycin, tobramycin  -sulfinpyrazone  -vaccines  -zalcitabine  Talk to your doctor or health care professional before taking any of these  medicines:  -acetaminophen  -aspirin  -ibuprofen  -ketoprofen  -naproxen  This list may not describe all possible interactions. Give your health care provider a list of all the medicines, herbs, non-prescription drugs, or dietary supplements you use. Also tell them if you smoke, drink alcohol, or use illegal drugs. Some items may interact with your medicine.  What should I watch for while using this medicine?  Your condition will be monitored carefully while you are receiving this medicine. You will need important blood work done while you are taking this medicine.  This drug may make you feel generally unwell. This is not uncommon, as chemotherapy can affect healthy cells as well as cancer cells. Report any side effects. Continue your course of treatment even though you feel ill unless your doctor tells you to stop.  In some cases, you may be given additional medicines to help with side effects. Follow all directions for their use.  Call your doctor or health care professional for advice if you get a fever, chills or sore throat, or other symptoms of a cold or flu. Do not treat yourself. This drug decreases your body's ability to fight infections. Try to avoid being around people who are sick.  This medicine may increase your risk to bruise or bleed. Call your doctor or health care professional if you notice any unusual bleeding.  Be careful brushing and flossing your teeth or using a toothpick because you may get an infection or bleed more easily. If you have any dental work done, tell your dentist you are receiving this medicine.  Avoid taking products   that contain aspirin, acetaminophen, ibuprofen, naproxen, or ketoprofen unless instructed by your doctor. These medicines may hide a fever.  Do not become pregnant while taking this medicine. Women should inform their doctor if they wish to become pregnant or think they might be pregnant. There is a potential for serious side effects to an unborn child. Talk to  your health care professional or pharmacist for more information. Do not breast-feed an infant while taking this medicine.  Drink fluids as directed while you are taking this medicine. This will help protect your kidneys.  Call your doctor or health care professional if you get diarrhea. Do not treat yourself.  What side effects may I notice from receiving this medicine?  Side effects that you should report to your doctor or health care professional as soon as possible:  -allergic reactions like skin rash, itching or hives, swelling of the face, lips, or tongue  -signs of infection - fever or chills, cough, sore throat, pain or difficulty passing urine  -signs of decreased platelets or bleeding - bruising, pinpoint red spots on the skin, black, tarry stools, nosebleeds  -signs of decreased red blood cells - unusually weak or tired, fainting spells, lightheadedness  -breathing problems  -changes in hearing  -gout pain  -low blood counts - This drug may decrease the number of white blood cells, red blood cells and platelets. You may be at increased risk for infections and bleeding.  -nausea and vomiting  -pain, swelling, redness or irritation at the injection site  -pain, tingling, numbness in the hands or feet  -problems with balance, movement  -trouble passing urine or change in the amount of urine  Side effects that usually do not require medical attention (report to your doctor or health care professional if they continue or are bothersome):  -changes in vision  -loss of appetite  -metallic taste in the mouth or changes in taste  This list may not describe all possible side effects. Call your doctor for medical advice about side effects. You may report side effects to FDA at 1-800-FDA-1088.  Where should I keep my medicine?  This drug is given in a hospital or clinic and will not be stored at home.  NOTE: This sheet is a summary. It may not cover all possible information. If you have questions about this medicine,  talk to your doctor, pharmacist, or health care provider.   2018 Elsevier/Gold Standard (2007-04-14 14:40:54)

## 2017-08-13 NOTE — Assessment & Plan Note (Signed)
Her cancer pain is stable I refilled her prescription pain medicine I warned her about risk of nausea and constipation

## 2017-08-13 NOTE — Discharge Summary (Addendum)
[facility] Discharge Summary   Patient: [name] BHA:[id number]   PCP: Mikey Kirschner, MD DOB: Mar 16, 1940   Date of admission: 08/02/2017   Date of discharge: 08/10/2017    Discharge Diagnoses:  Principal Problem:   Vulvar pain Active Problems:   Pancytopenia ([place])   Admitted From: home Disposition:  Home with home health  Recommendations for Outpatient Follow-up:  1. Please follow-up with PCP in 1 week as well as oncology  Follow-up Information    Luking, Grace Bushy, MD. Schedule an appointment as soon as possible for a visit in 1 week(s).   Specialty:  Family Medicine Contact information: [place] [address] [address] [place] Follow up.   Specialty:  Home Health Services Why:  Home Health RN, Physical Therapy, Occupational Therapy-agency will call to resume [place] information: [address] [place] [address] [phone]          Diet recommendation: Regular diet  Activity: The patient is advised to gradually reintroduce usual activities.  Discharge Condition: good  Code Status: Full code  History of present illness: As per the H and P dictated on admission, " [name]  is a 77 y.o. female, w vulvar cancer, dm2, aortic stenosis, h/o GI bleeding, apparently c/o vulvar pain uncontrolled with current medication (ms contin '30mg'$  po bid).  Pt thus presented to ED "  Hospital Course:  Summary of her active problems in the hospital is as following. 1.  Vulvar squamous cell carcinoma. Chronic right vulvar lesion. Increasing drainage from the vulvar lesion with vulvar infection. Started on IV Unasyn on admission. CT abdomen and pelvis shows concern for possible disease progression versus infection. Patient follows up with Dr. Alvy Bimler as well as gynecological oncology. Appreciate their assistance in managing this patient. Currently on wound care. Pain is well  controlled. There is also concern for thrombocytopenia, beta-lactam's and cephalosporins are generally associated with thrombocytopenia along with fluoroquinolones. After 5 days antibiotic are discontinued. As it was felt that this is progression of the cancer. Patient would like to continue palliative chemotherapy on discharge. She mentions that she has all the pain medication needed on discharge.  2.  Chemotherapy-induced pancytopenia. Patient has chronic anemia, now presents with worsening thrombocytopenia. Likely, cytopenia is most likely secondary to chemotherapy although current worsen secondary to infection and beta-lactam antibiotic. Platelets stabilized prior to discharge and were trending up.  No active bleeding noted in the hospital as well.  3.  Hypokalemia, hypomagnesemia,  Replaced.  4.  Moderate protein calorie malnutrition. Continue nutrition supplement.  Appreciate dietary consultation.  5 goals of care discussion Patient reported to me that she definitely does not want any surgery, if the chemotherapy is just going to prolong this cancer and her pain she would not like chemotherapy to prolong that but she is still not sure about hospice. Dr. Alvy Bimler had meeting with patient and son, patient would like to continue palliative chemotherapy for now. I provided the patient MOST form and palliative care provided hard choices book.  Patient informed me that if you keeping me alive on life support just to see who is coming in the room and going out of the room and if I do not have any quality of life I would not like to do it and let God take care of me.  Although before making any decision she would like to discuss with her son.  All other  chronic medical condition were stable during the hospitalization.  Patient was seen by physical therapy, who recommended home health, which was arranged by Education officer, museum and case Freight forwarder. On the day of the discharge the patient's vitals were  stable, and no other acute medical condition were reported by patient. the patient was felt safe to be discharge at home with home health.  Consultants: Oncology, palliative care, gynecology oncology Procedures: None  DISCHARGE MEDICATION: Allergies as of 08/10/2017      Reactions   Oxycodone Other (See Comments)   Intense H/A   Celebrex [celecoxib] Other (See Comments)   GI upset   Doxycycline Nausea Only   Gi upset Pt is able to tolerate with food   Iron Other (See Comments)   Pt cannot tolerate some iron preparations. The upset her stomach.   Lodine [etodolac] Nausea Only   Gi upset   Nabumetone Other (See Comments)   (relafen)  Gi upset   Tetracycline Nausea And Vomiting   Xarelto [rivaroxaban] Hives      Medication List    STOP taking these medications   aspirin EC 81 MG tablet   gabapentin 300 MG capsule Commonly known as:  NEURONTIN   losartan 50 MG tablet Commonly known as:  COZAAR   ondansetron 8 MG tablet Commonly known as:  ZOFRAN   oxyCODONE-acetaminophen 10-325 MG tablet Commonly known as:  PERCOCET   potassium chloride SA 20 MEQ tablet Commonly known as:  K-DUR,KLOR-CON   prochlorperazine 10 MG tablet Commonly known as:  COMPAZINE     TAKE these medications   blood glucose meter kit and supplies Kit Dispense based on patient and insurance preference. Use up to three times daily as directed (FOR ICD-10 E11.9).   collagenase ointment Commonly known as:  SANTYL Apply 1 application topically daily. Apply to vulva wound bed once daily with dressing changes   docusate sodium 100 MG capsule Commonly known as:  COLACE Take 1 capsule (100 mg total) by mouth 2 (two) times daily.   feeding supplement (PRO-STAT SUGAR FREE 64) Liqd Take 30 mLs by mouth 2 (two) times daily.   ferrous sulfate 325 (65 FE) MG tablet Take 1 tablet (325 mg total) by mouth daily with breakfast.   metFORMIN 500 MG tablet Commonly known as:  GLUCOPHAGE TAKE (1) TABLET BY  MOUTH TWICE DAILY.   polyethylene glycol packet Commonly known as:  MIRALAX / GLYCOLAX Take 17 g by mouth daily.   sodium chloride flush 0.9 % Soln injection Inject 10 mLs into the vein daily.      Allergies  Allergen Reactions  . Oxycodone Other (See Comments)    Intense H/A  . Celebrex [Celecoxib] Other (See Comments)    GI upset   . Doxycycline Nausea Only    Gi upset Pt is able to tolerate with food  . Iron Other (See Comments)    Pt cannot tolerate some iron preparations. The upset her stomach.  . Lodine [Etodolac] Nausea Only    Gi upset  . Nabumetone Other (See Comments)    (relafen)  Gi upset  . Tetracycline Nausea And Vomiting  . Xarelto [Rivaroxaban] Hives   Discharge Instructions    Diet general   Complete by:  As directed    Discharge instructions   Complete by:  As directed    It is important that you read following instructions as well as go over your medication list with RN to help you understand your care after this hospitalization.  Discharge Instructions:  Please follow-up with PCP in one week  Please request your primary care physician to go over all Hospital Tests and Procedure/Radiological results at the follow up,  Please get all Hospital records sent to your PCP by signing hospital release before you go home.   Do not take more than prescribed Pain, Sleep and Anxiety Medications. You were cared for by a hospitalist during your hospital stay. If you have any questions about your discharge medications or the care you received while you were in the hospital after you are discharged, you can call the unit and ask to speak with the hospitalist on call if the hospitalist that took care of you is not available.  Once you are discharged, your primary care physician will handle any further medical issues. Please note that NO REFILLS for any discharge medications will be authorized once you are discharged, as it is imperative that you return to your primary  care physician (or establish a relationship with a primary care physician if you do not have one) for your aftercare needs so that they can reassess your need for medications and monitor your lab values. You Must read complete instructions/literature along with all the possible adverse reactions/side effects for all the Medicines you take and that have been prescribed to you. Take any new Medicines after you have completely understood and accept all the possible adverse reactions/side effects. Wear Seat belts while driving. If you have smoked or chewed Tobacco in the last 2 yrs please stop smoking and/or stop any Recreational drug use.   Increase activity slowly   Complete by:  As directed      Discharge Exam: Filed Weights   08/07/17 0500 08/08/17 0450 08/10/17 0502  Weight: 80.2 kg (176 lb 11.2 oz) 79 kg (174 lb 1.6 oz) 80.5 kg (177 lb 7.5 oz)   Vitals:   08/10/17 0615 08/10/17 1422  BP: 109/62 119/62  Pulse: 76 83  Resp: 16 18  Temp: 98.1 F (36.7 C) 98.7 F (37.1 C)  SpO2: 95% 95%   General: Appear in no distress, no Rash; Oral Mucosa moist. Cardiovascular: S1 and S2 Present, aortic systolic Murmur, no JVD Respiratory: Bilateral Air entry present and Clear to Auscultation, no Crackles, no wheezes Abdomen: Bowel Sound present, Soft and no tenderness Extremities: no Pedal edema, no calf tenderness Neurology: Grossly no focal neuro deficit.  The results of significant diagnostics from this hospitalization (including imaging, microbiology, ancillary and laboratory) are listed below for reference.    Significant Diagnostic Studies: Ct Pelvis W Contrast  Addendum Date: 08/04/2017   ADDENDUM REPORT: 08/04/2017 10:07 ADDENDUM: The following is a comparison to PET-CT from 06/02/2017. The ulcerative mass involving the bulb of and extending into the vulva measures 5.7 by 3.4 by 4.0 cm. On the previous exam this measured 3.7 x 2.9 by 3.0 cm. Additionally, there is progressive enhancing  tumor which extends posteriorly into the right upper thigh with a maximum thickness of 0.9 cm, image 116/4. 0.7 cm. There is a enlarging enhancing nodule within the subcutaneous soft tissues of the left vulva measuring 1.2 x 1.0 cm. Previously this measured 0.7 cm in maximum diameter. More anteriorly within the left vulva is a enhancing mass measuring 1.2 x 2.6 cm, image 114/4. Previously this measured 1.6 x 1.2 cm. IMPRESSION: Overall there has been progression of disease involving bilateral vulva. On the right this demonstrates progressive anterior involvement, ulceration and extension into the mons pubis. This also demonstrates progressive posterior extension into the medial upper thigh.  Electronically Signed   By: Kerby Moors M.D.   On: 08/04/2017 10:07   Result Date: 08/04/2017 CLINICAL DATA:  76 year old female with malignant neoplasm of the vulva. EXAM: CT PELVIS WITH CONTRAST TECHNIQUE: Multidetector CT imaging of the pelvis was performed using the standard protocol following the bolus administration of intravenous contrast. CONTRAST:  32m OMNIPAQUE IOHEXOL 300 MG/ML  SOLN COMPARISON:  CT of the abdomen pelvis dated 04/30/2017 FINDINGS: Urinary Tract: The visualized distal ureters and urinary bladder appear unremarkable. Bowel: There is sigmoid diverticulosis without active inflammatory changes. No dilated bowel in the pelvis. Vascular/Lymphatic: Mild atherosclerotic disease.  No adenopathy. Reproductive: Hysterectomy. There is a 3.6 x 2.9 cm nodular density in the skin and subcutaneous tissues of the right vulvar region. There is irregularity of the overlying skin which may represent ulceration. Multiple additional nodules noted in the volar region and extending into the perineal subcutaneous soft tissues and adjacent to the vaginal orifice and anterior to the rectum. For example there is a 2.6 x 1.2 cm nodular lesion over the left vulvar area and a 12 x 10 mm nodular lesion in the left perineum  (series 4 image 112). There has been interval increase in the size of the vulvar lesion and development of new vulvar and perineal lesions compared to the prior CT. Other:  None Musculoskeletal: Total left hip arthroplasty. Osteopenia and degenerative changes of the spine. No acute osseous pathology. IMPRESSION: 1. Interval increase in the size and extent of vulvar mass with development of new vulvar and perineal lesions since the prior CT. 2. No evidence of metastatic disease within the pelvis. Electronically Signed: By: AAnner CreteM.D. On: 08/03/2017 04:28    Microbiology: Recent Results (from the past 240 hour(s))  Aerobic Culture (superficial specimen)     Status: Abnormal   Collection Time: 08/03/17 12:29 PM  Result Value Ref Range Status   Specimen Description   Final    VULVA Performed at [address], [place] [id number]   Special Requests   Final    Normal Performed at [facility] [address], [id number]   Gram Stain   Final    MODERATE WBC PRESENT, PREDOMINANTLY PMN ABUNDANT GRAM NEGATIVE RODS ABUNDANT GRAM POSITIVE COCCI ABUNDANT GRAM POSITIVE RODS Performed at [facility] [address] [place] [id number]   Culture MULTIPLE ORGANISMS PRESENT, NONE PREDOMINANT (A)  Final   Report Status 08/05/2017 FINAL  Final     Labs: CBC: Lab 08/07/17 0356 08/08/17 0439 08/09/17 0323 08/10/17 0453  WBC 3.7* 3.5* 3.6* 3.7*  NEUTROABS 2.8 2.7 2.5 2.8  HGB 9.5* 10.0* 9.7* 9.6*  HCT 29.8* 31.3* 30.8* 29.8*  MCV 88.7 89.2 88.8 89.2  PLT 24* 21* 18* 20*   Basic Metabolic Panel: Lab [id number]  NA 142  K 3.6  CL 106  CO2 29  GLUCOSE 110*  BUN 10  CREATININE 0.63  CALCIUM 9.0  MG 1.4*   Liver Function Tests: Lab 08/07/17 0356  AST 11*  ALT 9  ALKPHOS 53  BILITOT 0.6  PROT 5.3*  ALBUMIN 2.6*   No results for input(s): LIPASE, AMYLASE in the last 168  hours. No results for input(s): AMMONIA in the last 168 hours. Cardiac Enzymes: No results for input(s): CKTOTAL, CKMB, CKMBINDEX, TROPONINI in the last 168 hours. BNP (last 3 results) No results for input(s): BNP in the last 8760 hours. CBG: Recent Labs  Lab 08/09/17 2039 08/10/17 0728 08/10/17 1247 08/10/17 1400 08/10/17 1650  GLUCAP 100* 86 99 104* 99   Time spent: 35 minutes  Signed:  Berle Mull  [facility] 08/10/2017 , 12:27 PM

## 2017-08-13 NOTE — Progress Notes (Signed)
Pt picc dressing and securement device was changed today per Dr. Calton Dach Nurse

## 2017-08-13 NOTE — Telephone Encounter (Signed)
-----   Message from Heath Lark, MD sent at 08/13/2017  3:17 PM EDT ----- Regarding: low magnesium Pls call in magnesium oxide 400 mg BID PO x 60, 6 refillss

## 2017-08-14 ENCOUNTER — Ambulatory Visit: Payer: PPO

## 2017-08-14 ENCOUNTER — Other Ambulatory Visit: Payer: PPO

## 2017-08-14 ENCOUNTER — Ambulatory Visit: Payer: PPO | Admitting: Hematology and Oncology

## 2017-08-16 ENCOUNTER — Ambulatory Visit: Payer: PPO

## 2017-08-20 ENCOUNTER — Inpatient Hospital Stay: Payer: PPO

## 2017-08-20 ENCOUNTER — Encounter: Payer: Self-pay | Admitting: Oncology

## 2017-08-20 ENCOUNTER — Inpatient Hospital Stay (HOSPITAL_BASED_OUTPATIENT_CLINIC_OR_DEPARTMENT_OTHER): Payer: PPO | Admitting: Hematology and Oncology

## 2017-08-20 ENCOUNTER — Encounter: Payer: Self-pay | Admitting: Hematology and Oncology

## 2017-08-20 DIAGNOSIS — D509 Iron deficiency anemia, unspecified: Secondary | ICD-10-CM | POA: Diagnosis not present

## 2017-08-20 DIAGNOSIS — G893 Neoplasm related pain (acute) (chronic): Secondary | ICD-10-CM | POA: Diagnosis not present

## 2017-08-20 DIAGNOSIS — C519 Malignant neoplasm of vulva, unspecified: Secondary | ICD-10-CM

## 2017-08-20 DIAGNOSIS — Z7189 Other specified counseling: Secondary | ICD-10-CM

## 2017-08-20 DIAGNOSIS — Z5111 Encounter for antineoplastic chemotherapy: Secondary | ICD-10-CM | POA: Diagnosis not present

## 2017-08-20 LAB — COMPREHENSIVE METABOLIC PANEL
ALT: 6 U/L (ref 0–44)
AST: 10 U/L — AB (ref 15–41)
Albumin: 3.3 g/dL — ABNORMAL LOW (ref 3.5–5.0)
Alkaline Phosphatase: 76 U/L (ref 38–126)
Anion gap: 10 (ref 5–15)
BUN: 13 mg/dL (ref 8–23)
CHLORIDE: 103 mmol/L (ref 98–111)
CO2: 29 mmol/L (ref 22–32)
CREATININE: 0.71 mg/dL (ref 0.44–1.00)
Calcium: 9.7 mg/dL (ref 8.9–10.3)
GFR calc Af Amer: >60 mL/min (ref >60)
GFR calc non Af Amer: >60 mL/min (ref >60)
Glucose, Bld: 109 mg/dL — ABNORMAL HIGH (ref 70–99)
POTASSIUM: 3.2 mmol/L — AB (ref 3.5–5.1)
SODIUM: 142 mmol/L (ref 135–145)
Total Bilirubin: 0.5 mg/dL (ref 0.3–1.2)
Total Protein: 6.7 g/dL (ref 6.5–8.1)

## 2017-08-20 LAB — SAMPLE TO BLOOD BANK

## 2017-08-20 LAB — CBC WITH DIFFERENTIAL/PLATELET
BASOS ABS: 0 10*3/uL (ref 0.0–0.1)
BASOS PCT: 0 %
EOS ABS: 0 10*3/uL (ref 0.0–0.5)
EOS PCT: 0 %
HCT: 32.2 % — ABNORMAL LOW (ref 34.8–46.6)
Hemoglobin: 10.3 g/dL — ABNORMAL LOW (ref 11.6–15.9)
Lymphocytes Relative: 10 %
Lymphs Abs: 0.6 10*3/uL — ABNORMAL LOW (ref 0.9–3.3)
MCH: 28.5 pg (ref 25.1–34.0)
MCHC: 32 g/dL (ref 31.5–36.0)
MCV: 89.2 fL (ref 79.5–101.0)
Monocytes Absolute: 0.7 10*3/uL (ref 0.1–0.9)
Monocytes Relative: 13 %
Neutro Abs: 4.2 10*3/uL (ref 1.5–6.5)
Neutrophils Relative %: 77 %
PLATELETS: 144 10*3/uL — AB (ref 145–400)
RBC: 3.61 MIL/uL — AB (ref 3.70–5.45)
RDW: 17.8 % — ABNORMAL HIGH (ref 11.2–14.5)
WBC: 5.6 10*3/uL (ref 3.9–10.3)

## 2017-08-20 LAB — MAGNESIUM: Magnesium: 1.3 mg/dL — CL (ref 1.7–2.4)

## 2017-08-20 MED ORDER — HEPARIN SOD (PORK) LOCK FLUSH 100 UNIT/ML IV SOLN
250.0000 [IU] | Freq: Once | INTRAVENOUS | Status: AC
  Administered 2017-08-20: 250 [IU]
  Filled 2017-08-20: qty 5

## 2017-08-20 MED ORDER — ALTEPLASE 2 MG IJ SOLR
2.0000 mg | Freq: Once | INTRAMUSCULAR | Status: AC
  Administered 2017-08-20: 2 mg
  Filled 2017-08-20: qty 2

## 2017-08-20 MED ORDER — SODIUM CHLORIDE 0.9% FLUSH
10.0000 mL | Freq: Once | INTRAVENOUS | Status: AC
  Administered 2017-08-20: 10 mL
  Filled 2017-08-20: qty 10

## 2017-08-20 MED ORDER — ALTEPLASE 2 MG IJ SOLR
2.0000 mg | Freq: Once | INTRAMUSCULAR | Status: DC
  Filled 2017-08-20: qty 2

## 2017-08-20 NOTE — Progress Notes (Signed)
Picc line accessed. Only 2 ml of blood return noted. Site flushes well with normal saline, no pain or swelling noted when flushed. CAth-Flo instilled per policy, site marked, will monitor for blood return.

## 2017-08-21 ENCOUNTER — Other Ambulatory Visit: Payer: Self-pay

## 2017-08-21 ENCOUNTER — Telehealth: Payer: Self-pay

## 2017-08-21 ENCOUNTER — Encounter: Payer: Self-pay | Admitting: Hematology and Oncology

## 2017-08-21 ENCOUNTER — Other Ambulatory Visit: Payer: Self-pay | Admitting: Hematology and Oncology

## 2017-08-21 MED ORDER — MAGNESIUM OXIDE 400 (241.3 MG) MG PO TABS: 400.0000 mg | ORAL_TABLET | Freq: Two times a day (BID) | ORAL | 3 refills | Status: AC

## 2017-08-21 NOTE — Telephone Encounter (Signed)
Called and given below message. She verbalized understanding. Rx sent to pharmacy. °

## 2017-08-21 NOTE — Assessment & Plan Note (Signed)
We have extensive goals of care discussion The patient understood that treatment goal is palliative The patient desire aggressive supportive care along with treatment if possible.

## 2017-08-21 NOTE — Progress Notes (Signed)
[place] OFFICE PROGRESS NOTE  Patient Care Team: Mikey Kirschner, MD as PCP - General (Family Medicine)  ASSESSMENT & PLAN:  Vulva cancer [facility]) I have reviewed pictures that she took with a cell phone in regards to the wound area on her vulval Compared to her prior evaluation, I noted definitive disease progression We reviewed all her test results Immunotherapy is not an option We discussed the role of chemotherapy. The intent is of palliative intent.  We discussed some of the risks, benefits, side-effects of cisplatin  Some of the short term side-effects included, though not limited to, including weight loss, life threatening infections, risk of allergic reactions, need for transfusions of blood products, nausea, vomiting, change in bowel habits, loss of hair, admission to hospital for various reasons, and risks of death.   Long term side-effects are also discussed including risks of infertility, permanent damage to nerve function, hearing loss, chronic fatigue, kidney damage with possibility needing hemodialysis, and rare secondary malignancy including bone marrow disorders.  The patient is aware that the response rates discussed earlier is not guaranteed.  After a long discussion, patient made an informed decision to proceed with the prescribed plan of care.   Patient education material was dispensed. I plan weekly dose and see her back on a weekly basis for aggressive supportive care    Iron deficiency anemia She has multifactorial anemia including iron deficiency anemia She had received multiple units of blood transfusion and intravenous iron We will continue to monitor her blood counts carefully and to administer intravenous iron as needed  Cancer associated pain Her cancer pain is stable I refilled her prescription pain medicine I warned her about risk of nausea and constipation  Hypomagnesemia She has chronic hypomagnesemia I recommend oral magnesium  replacement therapy and we will monitor her magnesium level carefully  Goals of care, counseling/discussion We have extensive goals of care discussion The patient understood that treatment goal is palliative The patient desire aggressive supportive care along with treatment if possible.   No orders of the defined types were placed in this encounter.   INTERVAL HISTORY: Please see below for problem oriented charting. She returns with her son for further follow-up Her pain is well controlled The wound discharge she is stable She denies evidence of bleeding Her appetite is fair She denies nausea or constipation.  SUMMARY OF ONCOLOGIC HISTORY: Oncology History   PD-L1 neg     Vulva cancer ([place])   02/22/2016 Pathology Results    Labium, biopsy, right mid - INVASIVE SQUAMOUS CELL CARCINOMA.      02/23/2016 Imaging    Ct abdomen and pelvis Mild asymmetric soft tissue thickening in the right vulvar/perineal region, likely corresponding to the patient's known vulvar cancer, poorly evaluated on CT.  No evidence of metastatic disease.  Status post hysterectomy.      02/27/2016 Pathology Results    1. Lymph nodes, regional resection, right inguinal - METASTATIC CARCINOMA IN 1 OF 6 LYMPH NODES (1/6). 2. Vulva, excision, right - INVASIVE KERATINIZING SQUAMOUS CELL CARCINOMA, MODERATELY DIFFERENTIATED, SPANNING 2.1 CM WITH A DEPTH OF INVASION OF 0.6 CM. - THE SURGICAL RESECTION MARGIN ARE NEGATIVE FOR CARCINOMA. - DIFFUSE LICHEN SCLEROSUS ET ATROPHICUS. - SEE ONCOLOGY TABLE BELOW. Microscopic Comment 2. VULVA Specimen: Right vulvectomy. Procedure: Resection Lymph node sampling performed: Yes. Tumor site: Right vulva Tumor focality: Carcinoma is unifocal. Maximum tumor size (cm): 2.1 cm (gross measurement). Histologic type: Keratinizing squmaous cell carcinoma. Grade: Moderately differentiated. Depth of stromal invasion (  mm): 6 mm (glass slide measurement) Margins: Negative  for carcinoma. Distance of carcinoma from nearest margin: 0.6 cm to the 3 o'clock margin. Lymph - Vascular invasion: Not identified. Lymph nodes: number examined: 6 Number positive: 1 size of metastatic focus: 0.3 cm (glass slide measurement) extracapsular extension: Not identified. TNM code: pT1b, pN1a FIGO Stage (based on pathologic findings, needs clinical correlation): IIIA Comment: Grossly, there is a 2.1 cm sessile papular lesion which on histologic evaluation reveals invasive keratinizing squamous cell carcinoma invading to a depth of 0.6 cm as measured from a glass slide. In addition, there is diffuse lichen sclerosus et atrophicus. The surgical resection margins are negative for invasive caricnoma. Additional studies can be performed upon clinical request.      02/27/2016 Surgery    Surgery: Right radical partial vulvectomy, right inguinal lymph node dissection  Surgeons:  Imagene Gurney A. Alycia Rossetti, MD; Lahoma Crocker, MD   Pathology: Right vulva with resection margin with suture at 12:00. Right inguinal nodes.   Operative findings: 1.5 cm fungating erosive lesion of the right vulva approximately 2 cm from the urethra. Evidence of lichen sclerosis throughout the entire vulvar. No other ulcerative lesion suspicious for vulvar carcinoma. Shotty palpable lymphadenopathy in the right inguinal space.        04/01/2016 Imaging    Ct chest 1. Multiple pulmonary nodules as detailed above, generally in the 4 mm and less average diameter range although with 1 right lower lobe subpleural nodule measuring about 8 mm in diameter. These likely warrant surveillance. 2. Hypodense right thyroid nodule, 1.1 cm in long axis. Consider further evaluation with thyroid ultrasound. If patient is clinically hyperthyroid, consider nuclear medicine thyroid uptake and scan. 3. Stable hypodense lateral splenic lesion, likely a cyst. 4. Aortic and coronary atherosclerosis.       04/02/2016 Pathology Results     Labium, biopsy, left minora INVASIVE SQUAMOUS CELL CARCINOMA      04/09/2016 Pathology Results    1. Vulva, vulvectomy, left - INVASIVE SQUAMOUS CELL CARCINOMA, NON-KERATINIZING, SPANNING 2.4 CM IN WIDTH X 0.4 CM IN DEPTH. - CARCINOMA IS BROADLY PRESENT AT THE 6 O'CLOCK MARGIN OF SPECIMEN #1. - SEE ONCOLOGY TABLE BELOW. 2. Vulva, excision, lateral margin - SQUAMOUS LINED EPITHELIUM WITH LICHEN SCLEROSUS ET ATROPHICUS. 3. Vulva, excision, medial margin - SQUAMOUS LINED EPITHELIUM WITH LICHEN SCLEROSUS ET ATROPHICUS. Microscopic Comment 1. VULVA Specimen: Left vulva. Procedure: Left vulvectomy with additional lateral and medial margins. Lymph node sampling performed: Not on current case. Tumor site: Left vulva. Tumor focality: Unifocal. Maximum tumor size (cm): 2.4 cm in width (gross measurement) Histologic type: Squamous cell carcinoma, non-keratinizing. Grade: High grade. Depth of stromal invasion (mm): 0.4 cm (4 mm), glass slide measurement. Margins: Invasive carcinoma broadly present at the 6 o'clock margin of specimen #1. Lymph - Vascular invasion: Not identified. Lymph nodes: None examined. TNM code: pT1b, pNX FIGO Stage (based on pathologic findings, needs clinical correlation): IB      04/09/2016 Surgery    Surgery: Partial radical left vulvectomy  Surgeons:  Donaciano Eva, MD  Pathology: 1/ left vulva with marking stitch at 12 o'clock anterior, 2/ lateral (thigh) margin with stitch at true new lateral margin. 3/ medial (vaginal) margin with marking stitch at true new medial margin. Operative findings: 2cm left mid labial nodular lesion        05/03/2016 Imaging    Ct chest 1.  No evidence of acute pulmonary embolus. 2. Calcified coronary artery atherosclerosis. Negative visible aorta. 3. Pulmonary atelectasis with no  other acute pulmonary process. The small subpleural pulmonary nodules seen by CT last month are stable.       06/05/2016 - 07/25/2016  Radiation Therapy    Radiation treatment dates:   06/05/16 - 07/25/16  Site/dose:   Pelvis treated to 45 Gy in 25 fractions, then vulvar region Boosted an additional 18 Gy in 9 fractions.  Beams/energy:   Pelvis:  IMRT  //  6X                              Boost:  IMRT  //  6X       09/30/2016 Pathology Results    Vulva, biopsy, anterior vulva INVASIVE SQUAMOUS CELL CARCINOMA      10/07/2016 Imaging    Ct chest 1. Stable bilateral pulmonary nodules, likely benign but will require surveillance. Recommend followup noncontrast chest CT in 6-12 months. 2. No mediastinal or hilar mass or adenopathy. 3. Stable coronary artery calcifications.  Aortic Atherosclerosis (ICD10-I70.0).      10/17/2016 Pathology Results    Vulva, vulvectomy, anterior - INVASIVE SQUAMOUS CELL CARCINOMA, SPANNING 2.7 CM AND EXTENDING TO A DEPTH OF 0.3 CM. - RESECTION MARGINS ARE NEGATIVE. - SEE ONCOLOGY TABLE. Microscopic Comment VULVA Specimen: Anterior vulva. Procedure: Partial anterior vulvectomy. Lymph node sampling performed: None. Tumor site: Anterior vulva. Tumor focality: Unifocal. Maximum tumor size (cm): 2.7 cm. Histologic type: Squamous cell carcinoma, non-keratinizing. Grade: G2, moderately differentiated. Depth of stromal invasion (mm): 3 mm. Margins: Negative Distance of carcinoma from nearest margin: Invasive tumor is 1 mm from urethral margin. Lymph - Vascular invasion: Not identified. Lymph nodes: None. TNM code: pT1b, pNX FIGO Stage (based on pathologic findings, needs clinical correlation): IB      10/17/2016 Surgery    Surgery: Partial radical anterior vulvectomy  Surgeons:  Donaciano Eva, MD  Pathology: anterior vulva with marking stitch at anterior clitoral margin o'clock  Operative findings: 3cm raised vulvar cancer (leukoplakia) at midline and right vulva extending to within 0.5cm of anterior urethral meatus        02/17/2017 Pathology Results    Vulva,  biopsy - PARAKERATOSIS AND ACUTE INFLAMMATION. - PAS IS NEGATIVE FOR FUNGAL ORGANISMS. - NO DYSPLASIA OR MALIGNANCY.      05/01/2017 Imaging    Ct abdomen and pelvis New 4 x 3 cm rim enhancing lesion central air bubbles in the right vulvar region, which could represent locally recurrent tumor with necrosis or abscess.  No evidence of metastatic disease within the abdomen or pelvis.      05/15/2017 Pathology Results    1. Labium, biopsy, right posterior labia majora - PARAKERATOSIS. - NO DYSPLASIA OR MALIGNANCY. 2. Vulva, excision, anterior right mons pubis lesion - INVASIVE MODERATE TO POORLY DIFFERENTIATED SQUAMOUS CELL CARCINOMA. - ASSOCIATED ULCER, NECROSIS AND ACUTE INFLAMMATION. - SEE COMMENT. 3. Labium, biopsy, left mid labia majora - POORLY DIFFERENTIATED SQUAMOUS CELL CARCINOMA. - SEE COMMENT. Microscopic Comment 2. The specimen is disrupted hampering assessment of margin orientation and size, however, carcinoma involves almost the entire specimen (thus size estimated to be at least 3.1 cm with depth of 1 cm) and extends to the majority of the presumed lateral and deep margins. There is perineural invasion. 3. The carcinoma is largely beneath the epidermis and extends to the deep and lateral edges of the biopsy.      05/15/2017 Surgery    Surgery: debridement of anterior vulvar abscess, vulvar biopsies.   Surgeons:  Denman George,  Renato Battles, MD  Pathology: right posterior labia majora, anterior mons pubis, left mid labia majora  Operative findings: 6cm cavitating anterior mons pubis indurated lesion. It is approximately 3.5cm from the urethral meatus to inferior aspect of mass. Two indurated 1-2cm nodular areas (one on the posterior right labia majora and one on the mid left labia majora). No periurethral lesions.        06/02/2017 PET scan    Stable hypermetabolic soft tissue density in right vulva, which may be due to postoperative change from recent excision, although  residual malignancy and infection cannot be excluded.  No evidence of local or distant metastatic disease.      06/06/2017 Procedure    Successful right arm power PICC line placement with ultrasound and fluoroscopic guidance. The catheter is ready for use.      06/12/2017 Cancer Staging    Staging form: Vulva, AJCC 8th Edition - Clinical: Stage II (cT2, cN0, cM0) - Signed by Heath Lark, MD on 06/12/2017      06/13/2017 - 07/25/2017 Chemotherapy    The patient had carboplatin and Taxol      08/02/2017 - 08/10/2017 Hospital Admission    She was hospitalized due to severe vulvar pain and infection.      08/03/2017 Imaging    Overall there has been progression of disease involving bilateral vulva. On the right this demonstrates progressive anterior involvement, ulceration and extension into the mons pubis. This also demonstrates progressive posterior extension into the medial upper thigh.       Genetic Testing    Patient has genetic testing done for PD-L1 clone [id number]. Results revealed: PD-L1 staining in tumor cells (TC): 0% PD-L1 staining in tumor-associated immune cells (IC): 5% PD-L1 combined positive score (CPS): <1%       REVIEW OF SYSTEMS:   Constitutional: Denies fevers, chills or abnormal weight loss Eyes: Denies blurriness of vision Ears, nose, mouth, throat, and face: Denies mucositis or sore throat Respiratory: Denies cough, dyspnea or wheezes Cardiovascular: Denies palpitation, chest discomfort or lower extremity swelling Gastrointestinal:  Denies nausea, heartburn or change in bowel habits Skin: Denies abnormal skin rashes Lymphatics: Denies new lymphadenopathy or easy bruising Neurological:Denies numbness, tingling or new weaknesses Behavioral/Psych: Mood is stable, no new changes  All other systems were reviewed with the patient and are negative.  I have reviewed the past medical history, past surgical history, social history and family history with the patient and  they are unchanged from previous note.  ALLERGIES:  is allergic to oxycodone; celebrex [celecoxib]; doxycycline; iron; lodine [etodolac]; nabumetone; tetracycline; and xarelto [rivaroxaban].  MEDICATIONS:  Current Outpatient Medications  Medication Sig Dispense Refill  . Amino Acids-Protein Hydrolys (FEEDING SUPPLEMENT, PRO-STAT SUGAR FREE 64,) LIQD Take 30 mLs by mouth 2 (two) times daily. 900 mL 0  . blood glucose meter kit and supplies KIT Dispense based on patient and insurance preference. Use up to three times daily as directed (FOR ICD-10 E11.9). 1 each 0  . collagenase (SANTYL) ointment Apply 1 application topically daily. Apply to vulva wound bed once daily with dressing changes (Patient not taking: Reported on 08/03/2017) 15 g 1  . docusate sodium (COLACE) 100 MG capsule Take 1 capsule (100 mg total) by mouth 2 (two) times daily. 10 capsule 0  . ferrous sulfate 325 (65 FE) MG tablet Take 1 tablet (325 mg total) by mouth daily with breakfast. 30 tablet 1  . gabapentin (NEURONTIN) 100 MG capsule TAKE 1 CAPSULE BY MOUTH THREE TIMES A DAY. Flasher  capsule 1  . HYDROcodone-acetaminophen (NORCO) 10-325 MG tablet Take 1 tablet by mouth every 4 (four) hours as needed. 90 tablet 0  . magnesium oxide (MAG-OX) 400 (241.3 Mg) MG tablet Take 1 tablet (400 mg total) by mouth 2 (two) times daily. 60 tablet 3  . metFORMIN (GLUCOPHAGE) 500 MG tablet TAKE (1) TABLET BY MOUTH TWICE DAILY. 180 tablet 0  . morphine (MS CONTIN) 30 MG 12 hr tablet Take 1 tablet (30 mg total) by mouth every 12 (twelve) hours. 60 tablet 0  . polyethylene glycol (MIRALAX / GLYCOLAX) packet Take 17 g by mouth daily. 14 each 0  . sodium chloride flush 0.9 % SOLN injection Inject 10 mLs into the vein daily. 30 Syringe 9   No current facility-administered medications for this visit.     PHYSICAL EXAMINATION: ECOG PERFORMANCE STATUS: 2 - Symptomatic, <50% confined to bed  Vitals:   08/20/17 0906  BP: 102/66  Pulse: 99  Resp: 18   Temp: 97.9 F (36.6 C)  SpO2: 98%   Filed Weights    GENERAL:alert, no distress and comfortable SKIN: skin color, texture, turgor are normal, no rashes or significant lesions EYES: normal, Conjunctiva are pink and non-injected, sclera clear OROPHARYNX:no exudate, no erythema and lips, buccal mucosa, and tongue normal  NECK: supple, thyroid normal size, non-tender, without nodularity LYMPH:  no palpable lymphadenopathy in the cervical, axillary or inguinal LUNGS: clear to auscultation and percussion with normal breathing effort HEART: regular rate & rhythm with ESM murmurs and no lower extremity edema ABDOMEN:abdomen soft, non-tender and normal bowel sounds Musculoskeletal:no cyanosis of digits and no clubbing  NEURO: alert & oriented x 3 with fluent speech, no focal motor/sensory deficits  LABORATORY DATA:  I have reviewed the data as listed    Component Value Date/Time   NA 142 08/20/2017 0845   NA 144 09/30/2016 1505   K 3.2 (L) 08/20/2017 0845   K 4.4 09/30/2016 1505   CL 103 08/20/2017 0845   CO2 29 08/20/2017 0845   CO2 27 09/30/2016 1505   GLUCOSE 109 (H) 08/20/2017 0845   GLUCOSE 101 09/30/2016 1505   BUN 13 08/20/2017 0845   BUN 16.7 09/30/2016 1505   CREATININE 0.71 08/20/2017 0845   CREATININE 0.88 07/04/2017 0821   CREATININE 0.8 09/30/2016 1505   CALCIUM 9.7 08/20/2017 0845   CALCIUM 9.8 09/30/2016 1505   PROT 6.7 08/20/2017 0845   PROT 7.3 09/30/2016 1505   ALBUMIN 3.3 (L) 08/20/2017 0845   ALBUMIN 3.6 09/30/2016 1505   AST 10 (L) 08/20/2017 0845   AST 12 07/04/2017 0821   AST 27 09/30/2016 1505   ALT 6 08/20/2017 0845   ALT 9 07/04/2017 0821   ALT 26 09/30/2016 1505   ALKPHOS 76 08/20/2017 0845   ALKPHOS 76 09/30/2016 1505   BILITOT 0.5 08/20/2017 0845   BILITOT 0.3 07/04/2017 0821   BILITOT 0.49 09/30/2016 1505   GFRNONAA >60 08/20/2017 0845   GFRNONAA >60 07/04/2017 0821   GFRAA >60 08/20/2017 0845   GFRAA >60 07/04/2017 0821    No results  found for: SPEP, UPEP  Lab Results  Component Value Date   WBC 5.6 08/20/2017   NEUTROABS 4.2 08/20/2017   HGB 10.3 (L) 08/20/2017   HCT 32.2 (L) 08/20/2017   MCV 89.2 08/20/2017   PLT 144 (L) 08/20/2017      Chemistry      Component Value Date/Time   NA 142 08/20/2017 0845   NA 144 09/30/2016 1505  K 3.2 (L) 08/20/2017 0845   K 4.4 09/30/2016 1505   CL 103 08/20/2017 0845   CO2 29 08/20/2017 0845   CO2 27 09/30/2016 1505   BUN 13 08/20/2017 0845   BUN 16.7 09/30/2016 1505   CREATININE 0.71 08/20/2017 0845   CREATININE 0.88 07/04/2017 0821   CREATININE 0.8 09/30/2016 1505      Component Value Date/Time   CALCIUM 9.7 08/20/2017 0845   CALCIUM 9.8 09/30/2016 1505   ALKPHOS 76 08/20/2017 0845   ALKPHOS 76 09/30/2016 1505   AST 10 (L) 08/20/2017 0845   AST 12 07/04/2017 0821   AST 27 09/30/2016 1505   ALT 6 08/20/2017 0845   ALT 9 07/04/2017 0821   ALT 26 09/30/2016 1505   BILITOT 0.5 08/20/2017 0845   BILITOT 0.3 07/04/2017 0821   BILITOT 0.49 09/30/2016 1505       RADIOGRAPHIC STUDIES: I have personally reviewed the radiological images as listed and agreed with the findings in the report. Ct Pelvis W Contrast  Addendum Date: 08/04/2017   ADDENDUM REPORT: 08/04/2017 10:07 ADDENDUM: The following is a comparison to PET-CT from 06/02/2017. The ulcerative mass involving the bulb of and extending into the vulva measures 5.7 by 3.4 by 4.0 cm. On the previous exam this measured 3.7 x 2.9 by 3.0 cm. Additionally, there is progressive enhancing tumor which extends posteriorly into the right upper thigh with a maximum thickness of 0.9 cm, image 116/4. 0.7 cm. There is a enlarging enhancing nodule within the subcutaneous soft tissues of the left vulva measuring 1.2 x 1.0 cm. Previously this measured 0.7 cm in maximum diameter. More anteriorly within the left vulva is a enhancing mass measuring 1.2 x 2.6 cm, image 114/4. Previously this measured 1.6 x 1.2 cm. IMPRESSION: Overall  there has been progression of disease involving bilateral vulva. On the right this demonstrates progressive anterior involvement, ulceration and extension into the mons pubis. This also demonstrates progressive posterior extension into the medial upper thigh. Electronically Signed   By: Kerby Moors M.D.   On: 08/04/2017 10:07   Result Date: 08/04/2017 CLINICAL DATA:  77 year old female with malignant neoplasm of the vulva. EXAM: CT PELVIS WITH CONTRAST TECHNIQUE: Multidetector CT imaging of the pelvis was performed using the standard protocol following the bolus administration of intravenous contrast. CONTRAST:  10m OMNIPAQUE IOHEXOL 300 MG/ML  SOLN COMPARISON:  CT of the abdomen pelvis dated 04/30/2017 FINDINGS: Urinary Tract: The visualized distal ureters and urinary bladder appear unremarkable. Bowel: There is sigmoid diverticulosis without active inflammatory changes. No dilated bowel in the pelvis. Vascular/Lymphatic: Mild atherosclerotic disease.  No adenopathy. Reproductive: Hysterectomy. There is a 3.6 x 2.9 cm nodular density in the skin and subcutaneous tissues of the right vulvar region. There is irregularity of the overlying skin which may represent ulceration. Multiple additional nodules noted in the volar region and extending into the perineal subcutaneous soft tissues and adjacent to the vaginal orifice and anterior to the rectum. For example there is a 2.6 x 1.2 cm nodular lesion over the left vulvar area and a 12 x 10 mm nodular lesion in the left perineum (series 4 image 112). There has been interval increase in the size of the vulvar lesion and development of new vulvar and perineal lesions compared to the prior CT. Other:  None Musculoskeletal: Total left hip arthroplasty. Osteopenia and degenerative changes of the spine. No acute osseous pathology. IMPRESSION: 1. Interval increase in the size and extent of vulvar mass with development of new  vulvar and perineal lesions since the prior CT.  2. No evidence of metastatic disease within the pelvis. Electronically Signed: By: Anner Crete M.D. On: 08/03/2017 04:28    All questions were answered. The patient knows to call the clinic with any problems, questions or concerns. No barriers to learning was detected.  I spent 30 minutes counseling the patient face to face. The total time spent in the appointment was 40 minutes and more than 50% was on counseling and review of test results  Heath Lark, MD 08/21/2017 5:00 PM

## 2017-08-21 NOTE — Assessment & Plan Note (Signed)
I have reviewed pictures that she took with a cell phone in regards to the wound area on her vulval Compared to her prior evaluation, I noted definitive disease progression We reviewed all her test results Immunotherapy is not an option We discussed the role of chemotherapy. The intent is of palliative intent.  We discussed some of the risks, benefits, side-effects of cisplatin  Some of the short term side-effects included, though not limited to, including weight loss, life threatening infections, risk of allergic reactions, need for transfusions of blood products, nausea, vomiting, change in bowel habits, loss of hair, admission to hospital for various reasons, and risks of death.   Long term side-effects are also discussed including risks of infertility, permanent damage to nerve function, hearing loss, chronic fatigue, kidney damage with possibility needing hemodialysis, and rare secondary malignancy including bone marrow disorders.  The patient is aware that the response rates discussed earlier is not guaranteed.  After a long discussion, patient made an informed decision to proceed with the prescribed plan of care.   Patient education material was dispensed. I plan weekly dose and see her back on a weekly basis for aggressive supportive care

## 2017-08-21 NOTE — Telephone Encounter (Signed)
-----   Message from Heath Lark, MD sent at 08/21/2017 10:09 AM EDT ----- Regarding: low magnesium Call her; magnesium is low Call in magnesium 400 mg BID PO x 30 days, 3 refills

## 2017-08-21 NOTE — Assessment & Plan Note (Signed)
She has chronic hypomagnesemia I recommend oral magnesium replacement therapy and we will monitor her magnesium level carefully

## 2017-08-21 NOTE — Assessment & Plan Note (Signed)
Her cancer pain is stable I refilled her prescription pain medicine I warned her about risk of nausea and constipation

## 2017-08-21 NOTE — Assessment & Plan Note (Signed)
She has multifactorial anemia including iron deficiency anemia She had received multiple units of blood transfusion and intravenous iron We will continue to monitor her blood counts carefully and to administer intravenous iron as needed

## 2017-08-22 ENCOUNTER — Inpatient Hospital Stay (HOSPITAL_BASED_OUTPATIENT_CLINIC_OR_DEPARTMENT_OTHER): Payer: PPO | Admitting: Hematology and Oncology

## 2017-08-22 ENCOUNTER — Encounter: Payer: Self-pay | Admitting: Oncology

## 2017-08-22 ENCOUNTER — Inpatient Hospital Stay: Payer: PPO | Attending: Gynecologic Oncology

## 2017-08-22 ENCOUNTER — Other Ambulatory Visit: Payer: Self-pay | Admitting: Hematology and Oncology

## 2017-08-22 ENCOUNTER — Encounter: Payer: Self-pay | Admitting: Hematology and Oncology

## 2017-08-22 VITALS — BP 125/78 | HR 91 | Temp 97.9°F | Resp 18

## 2017-08-22 DIAGNOSIS — C519 Malignant neoplasm of vulva, unspecified: Secondary | ICD-10-CM

## 2017-08-22 DIAGNOSIS — G893 Neoplasm related pain (acute) (chronic): Secondary | ICD-10-CM

## 2017-08-22 DIAGNOSIS — D509 Iron deficiency anemia, unspecified: Secondary | ICD-10-CM | POA: Diagnosis not present

## 2017-08-22 DIAGNOSIS — Z5111 Encounter for antineoplastic chemotherapy: Secondary | ICD-10-CM | POA: Diagnosis not present

## 2017-08-22 DIAGNOSIS — D61818 Other pancytopenia: Secondary | ICD-10-CM | POA: Diagnosis not present

## 2017-08-22 MED ORDER — HEPARIN SOD (PORK) LOCK FLUSH 100 UNIT/ML IV SOLN
250.0000 [IU] | Freq: Once | INTRAVENOUS | Status: AC | PRN
  Administered 2017-08-22: 250 [IU]
  Filled 2017-08-22: qty 5

## 2017-08-22 MED ORDER — POTASSIUM CHLORIDE 2 MEQ/ML IV SOLN
Freq: Once | INTRAVENOUS | Status: AC
  Administered 2017-08-22: 09:00:00 via INTRAVENOUS
  Filled 2017-08-22: qty 10

## 2017-08-22 MED ORDER — SODIUM CHLORIDE 0.9 % IV SOLN
35.0000 mg/m2 | Freq: Once | INTRAVENOUS | Status: AC
  Administered 2017-08-22: 65 mg via INTRAVENOUS
  Filled 2017-08-22: qty 65

## 2017-08-22 MED ORDER — HYDROMORPHONE HCL 2 MG/ML IJ SOLN
INTRAMUSCULAR | Status: AC
  Filled 2017-08-22: qty 1

## 2017-08-22 MED ORDER — FOSAPREPITANT DIMEGLUMINE INJECTION 150 MG
Freq: Once | INTRAVENOUS | Status: AC
  Administered 2017-08-22: 12:00:00 via INTRAVENOUS
  Filled 2017-08-22: qty 5

## 2017-08-22 MED ORDER — SODIUM CHLORIDE 0.9% FLUSH
10.0000 mL | INTRAVENOUS | Status: DC | PRN
  Administered 2017-08-22: 10 mL
  Filled 2017-08-22: qty 10

## 2017-08-22 MED ORDER — HYDROMORPHONE HCL 2 MG/ML IJ SOLN
2.0000 mg | INTRAMUSCULAR | Status: DC | PRN
  Administered 2017-08-22 (×2): 2 mg via INTRAVENOUS

## 2017-08-22 MED ORDER — HYDROMORPHONE HCL 1 MG/ML IJ SOLN: 2.0000 mg | Freq: Once | INTRAMUSCULAR | Status: AC

## 2017-08-22 MED ORDER — PALONOSETRON HCL INJECTION 0.25 MG/5ML
0.2500 mg | Freq: Once | INTRAVENOUS | Status: AC
  Administered 2017-08-22: 0.25 mg via INTRAVENOUS

## 2017-08-22 MED ORDER — PALONOSETRON HCL INJECTION 0.25 MG/5ML
INTRAVENOUS | Status: AC
  Filled 2017-08-22: qty 5

## 2017-08-22 MED ORDER — SODIUM CHLORIDE 0.9 % IV SOLN
Freq: Once | INTRAVENOUS | Status: AC
  Administered 2017-08-22: 09:00:00 via INTRAVENOUS
  Filled 2017-08-22: qty 250

## 2017-08-22 NOTE — Assessment & Plan Note (Signed)
She has significant anxiety and pain this morning However, with adequate pain control, she felt better She is willing to proceed with chemotherapy as prescribed

## 2017-08-22 NOTE — Progress Notes (Signed)
[place] OFFICE PROGRESS NOTE  Patient Care Team: Mikey Kirschner, MD as PCP - General (Family Medicine)  ASSESSMENT & PLAN:  Vulva cancer [facility]) She has significant anxiety and pain this morning However, with adequate pain control, she felt better She is willing to proceed with chemotherapy as prescribed  Cancer associated pain She has poorly controlled pain when she arrived to the cancer center With additional interim venous pain medicine, her pain is well controlled I recommend increasing MS Contin to 3 times a day along with hydrocodone as needed for breakthrough pain I will reassess pain control in the next visit   No orders of the defined types were placed in this encounter.   INTERVAL HISTORY: Please see below for problem oriented charting. She was evaluated for times today due to uncontrolled pain. The patient show up at the cancer center early morning with severe, uncontrolled pain She is tearful and was contemplating about quitting treatment.  After several different evaluation with adequate IV pain medicine, she felt better She is able to complete her treatment without difficulties  SUMMARY OF ONCOLOGIC HISTORY: Oncology History   PD-L1 neg     Vulva cancer ([place])   02/22/2016 Pathology Results    Labium, biopsy, right mid - INVASIVE SQUAMOUS CELL CARCINOMA.      02/23/2016 Imaging    Ct abdomen and pelvis Mild asymmetric soft tissue thickening in the right vulvar/perineal region, likely corresponding to the patient's known vulvar cancer, poorly evaluated on CT.  No evidence of metastatic disease.  Status post hysterectomy.      02/27/2016 Pathology Results    1. Lymph nodes, regional resection, right inguinal - METASTATIC CARCINOMA IN 1 OF 6 LYMPH NODES (1/6). 2. Vulva, excision, right - INVASIVE KERATINIZING SQUAMOUS CELL CARCINOMA, MODERATELY DIFFERENTIATED, SPANNING 2.1 CM WITH A DEPTH OF INVASION OF 0.6 CM. - THE SURGICAL RESECTION  MARGIN ARE NEGATIVE FOR CARCINOMA. - DIFFUSE LICHEN SCLEROSUS ET ATROPHICUS. - SEE ONCOLOGY TABLE BELOW. Microscopic Comment 2. VULVA Specimen: Right vulvectomy. Procedure: Resection Lymph node sampling performed: Yes. Tumor site: Right vulva Tumor focality: Carcinoma is unifocal. Maximum tumor size (cm): 2.1 cm (gross measurement). Histologic type: Keratinizing squmaous cell carcinoma. Grade: Moderately differentiated. Depth of stromal invasion (mm): 6 mm (glass slide measurement) Margins: Negative for carcinoma. Distance of carcinoma from nearest margin: 0.6 cm to the 3 o'clock margin. Lymph - Vascular invasion: Not identified. Lymph nodes: number examined: 6 Number positive: 1 size of metastatic focus: 0.3 cm (glass slide measurement) extracapsular extension: Not identified. TNM code: pT1b, pN1a FIGO Stage (based on pathologic findings, needs clinical correlation): IIIA Comment: Grossly, there is a 2.1 cm sessile papular lesion which on histologic evaluation reveals invasive keratinizing squamous cell carcinoma invading to a depth of 0.6 cm as measured from a glass slide. In addition, there is diffuse lichen sclerosus et atrophicus. The surgical resection margins are negative for invasive caricnoma. Additional studies can be performed upon clinical request.      02/27/2016 Surgery    Surgery: Right radical partial vulvectomy, right inguinal lymph node dissection  Surgeons:  Imagene Gurney A. Alycia Rossetti, MD; Lahoma Crocker, MD   Pathology: Right vulva with resection margin with suture at 12:00. Right inguinal nodes.   Operative findings: 1.5 cm fungating erosive lesion of the right vulva approximately 2 cm from the urethra. Evidence of lichen sclerosis throughout the entire vulvar. No other ulcerative lesion suspicious for vulvar carcinoma. Shotty palpable lymphadenopathy in the right inguinal space.  04/01/2016 Imaging    Ct chest 1. Multiple pulmonary nodules as detailed  above, generally in the 4 mm and less average diameter range although with 1 right lower lobe subpleural nodule measuring about 8 mm in diameter. These likely warrant surveillance. 2. Hypodense right thyroid nodule, 1.1 cm in long axis. Consider further evaluation with thyroid ultrasound. If patient is clinically hyperthyroid, consider nuclear medicine thyroid uptake and scan. 3. Stable hypodense lateral splenic lesion, likely a cyst. 4. Aortic and coronary atherosclerosis.       04/02/2016 Pathology Results    Labium, biopsy, left minora INVASIVE SQUAMOUS CELL CARCINOMA      04/09/2016 Pathology Results    1. Vulva, vulvectomy, left - INVASIVE SQUAMOUS CELL CARCINOMA, NON-KERATINIZING, SPANNING 2.4 CM IN WIDTH X 0.4 CM IN DEPTH. - CARCINOMA IS BROADLY PRESENT AT THE 6 O'CLOCK MARGIN OF SPECIMEN #1. - SEE ONCOLOGY TABLE BELOW. 2. Vulva, excision, lateral margin - SQUAMOUS LINED EPITHELIUM WITH LICHEN SCLEROSUS ET ATROPHICUS. 3. Vulva, excision, medial margin - SQUAMOUS LINED EPITHELIUM WITH LICHEN SCLEROSUS ET ATROPHICUS. Microscopic Comment 1. VULVA Specimen: Left vulva. Procedure: Left vulvectomy with additional lateral and medial margins. Lymph node sampling performed: Not on current case. Tumor site: Left vulva. Tumor focality: Unifocal. Maximum tumor size (cm): 2.4 cm in width (gross measurement) Histologic type: Squamous cell carcinoma, non-keratinizing. Grade: High grade. Depth of stromal invasion (mm): 0.4 cm (4 mm), glass slide measurement. Margins: Invasive carcinoma broadly present at the 6 o'clock margin of specimen #1. Lymph - Vascular invasion: Not identified. Lymph nodes: None examined. TNM code: pT1b, pNX FIGO Stage (based on pathologic findings, needs clinical correlation): IB      04/09/2016 Surgery    Surgery: Partial radical left vulvectomy  Surgeons:  Donaciano Eva, MD  Pathology: 1/ left vulva with marking stitch at 12 o'clock anterior, 2/  lateral (thigh) margin with stitch at true new lateral margin. 3/ medial (vaginal) margin with marking stitch at true new medial margin. Operative findings: 2cm left mid labial nodular lesion        05/03/2016 Imaging    Ct chest 1.  No evidence of acute pulmonary embolus. 2. Calcified coronary artery atherosclerosis. Negative visible aorta. 3. Pulmonary atelectasis with no other acute pulmonary process. The small subpleural pulmonary nodules seen by CT last month are stable.       06/05/2016 - 07/25/2016 Radiation Therapy    Radiation treatment dates:   06/05/16 - 07/25/16  Site/dose:   Pelvis treated to 45 Gy in 25 fractions, then vulvar region Boosted an additional 18 Gy in 9 fractions.  Beams/energy:   Pelvis:  IMRT  //  6X                              Boost:  IMRT  //  6X       09/30/2016 Pathology Results    Vulva, biopsy, anterior vulva INVASIVE SQUAMOUS CELL CARCINOMA      10/07/2016 Imaging    Ct chest 1. Stable bilateral pulmonary nodules, likely benign but will require surveillance. Recommend followup noncontrast chest CT in 6-12 months. 2. No mediastinal or hilar mass or adenopathy. 3. Stable coronary artery calcifications.  Aortic Atherosclerosis (ICD10-I70.0).      10/17/2016 Pathology Results    Vulva, vulvectomy, anterior - INVASIVE SQUAMOUS CELL CARCINOMA, SPANNING 2.7 CM AND EXTENDING TO A DEPTH OF 0.3 CM. - RESECTION MARGINS ARE NEGATIVE. - SEE ONCOLOGY TABLE. Microscopic Comment VULVA  Specimen: Anterior vulva. Procedure: Partial anterior vulvectomy. Lymph node sampling performed: None. Tumor site: Anterior vulva. Tumor focality: Unifocal. Maximum tumor size (cm): 2.7 cm. Histologic type: Squamous cell carcinoma, non-keratinizing. Grade: G2, moderately differentiated. Depth of stromal invasion (mm): 3 mm. Margins: Negative Distance of carcinoma from nearest margin: Invasive tumor is 1 mm from urethral margin. Lymph - Vascular invasion: Not  identified. Lymph nodes: None. TNM code: pT1b, pNX FIGO Stage (based on pathologic findings, needs clinical correlation): IB      10/17/2016 Surgery    Surgery: Partial radical anterior vulvectomy  Surgeons:  Donaciano Eva, MD  Pathology: anterior vulva with marking stitch at anterior clitoral margin o'clock  Operative findings: 3cm raised vulvar cancer (leukoplakia) at midline and right vulva extending to within 0.5cm of anterior urethral meatus        02/17/2017 Pathology Results    Vulva, biopsy - PARAKERATOSIS AND ACUTE INFLAMMATION. - PAS IS NEGATIVE FOR FUNGAL ORGANISMS. - NO DYSPLASIA OR MALIGNANCY.      05/01/2017 Imaging    Ct abdomen and pelvis New 4 x 3 cm rim enhancing lesion central air bubbles in the right vulvar region, which could represent locally recurrent tumor with necrosis or abscess.  No evidence of metastatic disease within the abdomen or pelvis.      05/15/2017 Pathology Results    1. Labium, biopsy, right posterior labia majora - PARAKERATOSIS. - NO DYSPLASIA OR MALIGNANCY. 2. Vulva, excision, anterior right mons pubis lesion - INVASIVE MODERATE TO POORLY DIFFERENTIATED SQUAMOUS CELL CARCINOMA. - ASSOCIATED ULCER, NECROSIS AND ACUTE INFLAMMATION. - SEE COMMENT. 3. Labium, biopsy, left mid labia majora - POORLY DIFFERENTIATED SQUAMOUS CELL CARCINOMA. - SEE COMMENT. Microscopic Comment 2. The specimen is disrupted hampering assessment of margin orientation and size, however, carcinoma involves almost the entire specimen (thus size estimated to be at least 3.1 cm with depth of 1 cm) and extends to the majority of the presumed lateral and deep margins. There is perineural invasion. 3. The carcinoma is largely beneath the epidermis and extends to the deep and lateral edges of the biopsy.      05/15/2017 Surgery    Surgery: debridement of anterior vulvar abscess, vulvar biopsies.   Surgeons:  Donaciano Eva, MD  Pathology: right  posterior labia majora, anterior mons pubis, left mid labia majora  Operative findings: 6cm cavitating anterior mons pubis indurated lesion. It is approximately 3.5cm from the urethral meatus to inferior aspect of mass. Two indurated 1-2cm nodular areas (one on the posterior right labia majora and one on the mid left labia majora). No periurethral lesions.        06/02/2017 PET scan    Stable hypermetabolic soft tissue density in right vulva, which may be due to postoperative change from recent excision, although residual malignancy and infection cannot be excluded.  No evidence of local or distant metastatic disease.      06/06/2017 Procedure    Successful right arm power PICC line placement with ultrasound and fluoroscopic guidance. The catheter is ready for use.      06/12/2017 Cancer Staging    Staging form: Vulva, AJCC 8th Edition - Clinical: Stage II (cT2, cN0, cM0) - Signed by Heath Lark, MD on 06/12/2017      06/13/2017 - 07/25/2017 Chemotherapy    The patient had carboplatin and Taxol      08/02/2017 - 08/10/2017 Hospital Admission    She was hospitalized due to severe vulvar pain and infection.      08/03/2017 Imaging  Overall there has been progression of disease involving bilateral vulva. On the right this demonstrates progressive anterior involvement, ulceration and extension into the mons pubis. This also demonstrates progressive posterior extension into the medial upper thigh.       Genetic Testing    Patient has genetic testing done for PD-L1 clone [id number]. Results revealed: PD-L1 staining in tumor cells (TC): 0% PD-L1 staining in tumor-associated immune cells (IC): 5% PD-L1 combined positive score (CPS): <1%       REVIEW OF SYSTEMS:   Constitutional: Denies fevers, chills or abnormal weight loss Eyes: Denies blurriness of vision Ears, nose, mouth, throat, and face: Denies mucositis or sore throat Respiratory: Denies cough, dyspnea or wheezes Cardiovascular:  Denies palpitation, chest discomfort or lower extremity swelling Gastrointestinal:  Denies nausea, heartburn or change in bowel habits Skin: Denies abnormal skin rashes Lymphatics: Denies new lymphadenopathy or easy bruising Neurological:Denies numbness, tingling or new weaknesses Behavioral/Psych: Mood is stable, no new changes  All other systems were reviewed with the patient and are negative.  I have reviewed the past medical history, past surgical history, social history and family history with the patient and they are unchanged from previous note.  ALLERGIES:  is allergic to oxycodone; celebrex [celecoxib]; doxycycline; iron; lodine [etodolac]; nabumetone; tetracycline; and xarelto [rivaroxaban].  MEDICATIONS:  Current Outpatient Medications  Medication Sig Dispense Refill  . Amino Acids-Protein Hydrolys (FEEDING SUPPLEMENT, PRO-STAT SUGAR FREE 64,) LIQD Take 30 mLs by mouth 2 (two) times daily. 900 mL 0  . blood glucose meter kit and supplies KIT Dispense based on patient and insurance preference. Use up to three times daily as directed (FOR ICD-10 E11.9). 1 each 0  . collagenase (SANTYL) ointment Apply 1 application topically daily. Apply to vulva wound bed once daily with dressing changes (Patient not taking: Reported on 08/03/2017) 15 g 1  . docusate sodium (COLACE) 100 MG capsule Take 1 capsule (100 mg total) by mouth 2 (two) times daily. 10 capsule 0  . ferrous sulfate 325 (65 FE) MG tablet Take 1 tablet (325 mg total) by mouth daily with breakfast. 30 tablet 1  . gabapentin (NEURONTIN) 100 MG capsule TAKE 1 CAPSULE BY MOUTH THREE TIMES A DAY. 270 capsule 1  . HYDROcodone-acetaminophen (NORCO) 10-325 MG tablet Take 1 tablet by mouth every 4 (four) hours as needed. 90 tablet 0  . magnesium oxide (MAG-OX) 400 (241.3 Mg) MG tablet Take 1 tablet (400 mg total) by mouth 2 (two) times daily. 60 tablet 3  . metFORMIN (GLUCOPHAGE) 500 MG tablet TAKE (1) TABLET BY MOUTH TWICE DAILY. 180  tablet 0  . morphine (MS CONTIN) 30 MG 12 hr tablet Take 1 tablet (30 mg total) by mouth every 12 (twelve) hours. 60 tablet 0  . polyethylene glycol (MIRALAX / GLYCOLAX) packet Take 17 g by mouth daily. 14 each 0  . sodium chloride flush 0.9 % SOLN injection Inject 10 mLs into the vein daily. 30 Syringe 9   No current facility-administered medications for this visit.    Facility-Administered Medications Ordered in Other Visits  Medication Dose Route Frequency Provider Last Rate Last Dose  . heparin lock flush 100 unit/mL  250 Units Intracatheter Once PRN Alvy Bimler, Sheronica Corey, MD      . HYDROmorphone (DILAUDID) injection 2 mg  2 mg Intravenous Q2H PRN Heath Lark, MD   2 mg at 08/22/17 1478  . sodium chloride flush (NS) 0.9 % injection 10 mL  10 mL Intracatheter PRN Heath Lark, MD  PHYSICAL EXAMINATION: She was evaluated several times.  With her last evaluation, she is calm and appears to be pain-free ECOG PERFORMANCE STATUS: 2 - Symptomatic, <50% confined to bed GENERAL:alert, no distress and comfortable SKIN: skin color, texture, turgor are normal, no rashes or significant lesions NEURO: alert & oriented x 3 with fluent speech, no focal motor/sensory deficits  LABORATORY DATA:  I have reviewed the data as listed    Component Value Date/Time   NA 142 08/20/2017 0845   NA 144 09/30/2016 1505   K 3.2 (L) 08/20/2017 0845   K 4.4 09/30/2016 1505   CL 103 08/20/2017 0845   CO2 29 08/20/2017 0845   CO2 27 09/30/2016 1505   GLUCOSE 109 (H) 08/20/2017 0845   GLUCOSE 101 09/30/2016 1505   BUN 13 08/20/2017 0845   BUN 16.7 09/30/2016 1505   CREATININE 0.71 08/20/2017 0845   CREATININE 0.88 07/04/2017 0821   CREATININE 0.8 09/30/2016 1505   CALCIUM 9.7 08/20/2017 0845   CALCIUM 9.8 09/30/2016 1505   PROT 6.7 08/20/2017 0845   PROT 7.3 09/30/2016 1505   ALBUMIN 3.3 (L) 08/20/2017 0845   ALBUMIN 3.6 09/30/2016 1505   AST 10 (L) 08/20/2017 0845   AST 12 07/04/2017 0821   AST 27  09/30/2016 1505   ALT 6 08/20/2017 0845   ALT 9 07/04/2017 0821   ALT 26 09/30/2016 1505   ALKPHOS 76 08/20/2017 0845   ALKPHOS 76 09/30/2016 1505   BILITOT 0.5 08/20/2017 0845   BILITOT 0.3 07/04/2017 0821   BILITOT 0.49 09/30/2016 1505   GFRNONAA >60 08/20/2017 0845   GFRNONAA >60 07/04/2017 0821   GFRAA >60 08/20/2017 0845   GFRAA >60 07/04/2017 0821    No results found for: SPEP, UPEP  Lab Results  Component Value Date   WBC 5.6 08/20/2017   NEUTROABS 4.2 08/20/2017   HGB 10.3 (L) 08/20/2017   HCT 32.2 (L) 08/20/2017   MCV 89.2 08/20/2017   PLT 144 (L) 08/20/2017      Chemistry      Component Value Date/Time   NA 142 08/20/2017 0845   NA 144 09/30/2016 1505   K 3.2 (L) 08/20/2017 0845   K 4.4 09/30/2016 1505   CL 103 08/20/2017 0845   CO2 29 08/20/2017 0845   CO2 27 09/30/2016 1505   BUN 13 08/20/2017 0845   BUN 16.7 09/30/2016 1505   CREATININE 0.71 08/20/2017 0845   CREATININE 0.88 07/04/2017 0821   CREATININE 0.8 09/30/2016 1505      Component Value Date/Time   CALCIUM 9.7 08/20/2017 0845   CALCIUM 9.8 09/30/2016 1505   ALKPHOS 76 08/20/2017 0845   ALKPHOS 76 09/30/2016 1505   AST 10 (L) 08/20/2017 0845   AST 12 07/04/2017 0821   AST 27 09/30/2016 1505   ALT 6 08/20/2017 0845   ALT 9 07/04/2017 0821   ALT 26 09/30/2016 1505   BILITOT 0.5 08/20/2017 0845   BILITOT 0.3 07/04/2017 0821   BILITOT 0.49 09/30/2016 1505       RADIOGRAPHIC STUDIES: I have personally reviewed the radiological images as listed and agreed with the findings in the report. Ct Pelvis W Contrast  Addendum Date: 08/04/2017   ADDENDUM REPORT: 08/04/2017 10:07 ADDENDUM: The following is a comparison to PET-CT from 06/02/2017. The ulcerative mass involving the bulb of and extending into the vulva measures 5.7 by 3.4 by 4.0 cm. On the previous exam this measured 3.7 x 2.9 by 3.0 cm. Additionally, there is progressive enhancing  tumor which extends posteriorly into the right upper  thigh with a maximum thickness of 0.9 cm, image 116/4. 0.7 cm. There is a enlarging enhancing nodule within the subcutaneous soft tissues of the left vulva measuring 1.2 x 1.0 cm. Previously this measured 0.7 cm in maximum diameter. More anteriorly within the left vulva is a enhancing mass measuring 1.2 x 2.6 cm, image 114/4. Previously this measured 1.6 x 1.2 cm. IMPRESSION: Overall there has been progression of disease involving bilateral vulva. On the right this demonstrates progressive anterior involvement, ulceration and extension into the mons pubis. This also demonstrates progressive posterior extension into the medial upper thigh. Electronically Signed   By: Kerby Moors M.D.   On: 08/04/2017 10:07   Result Date: 08/04/2017 CLINICAL DATA:  77 year old female with malignant neoplasm of the vulva. EXAM: CT PELVIS WITH CONTRAST TECHNIQUE: Multidetector CT imaging of the pelvis was performed using the standard protocol following the bolus administration of intravenous contrast. CONTRAST:  52m OMNIPAQUE IOHEXOL 300 MG/ML  SOLN COMPARISON:  CT of the abdomen pelvis dated 04/30/2017 FINDINGS: Urinary Tract: The visualized distal ureters and urinary bladder appear unremarkable. Bowel: There is sigmoid diverticulosis without active inflammatory changes. No dilated bowel in the pelvis. Vascular/Lymphatic: Mild atherosclerotic disease.  No adenopathy. Reproductive: Hysterectomy. There is a 3.6 x 2.9 cm nodular density in the skin and subcutaneous tissues of the right vulvar region. There is irregularity of the overlying skin which may represent ulceration. Multiple additional nodules noted in the volar region and extending into the perineal subcutaneous soft tissues and adjacent to the vaginal orifice and anterior to the rectum. For example there is a 2.6 x 1.2 cm nodular lesion over the left vulvar area and a 12 x 10 mm nodular lesion in the left perineum (series 4 image 112). There has been interval increase in  the size of the vulvar lesion and development of new vulvar and perineal lesions compared to the prior CT. Other:  None Musculoskeletal: Total left hip arthroplasty. Osteopenia and degenerative changes of the spine. No acute osseous pathology. IMPRESSION: 1. Interval increase in the size and extent of vulvar mass with development of new vulvar and perineal lesions since the prior CT. 2. No evidence of metastatic disease within the pelvis. Electronically Signed: By: AAnner CreteM.D. On: 08/03/2017 04:28    All questions were answered. The patient knows to call the clinic with any problems, questions or concerns. No barriers to learning was detected.  I spent 15 minutes counseling the patient face to face. The total time spent in the appointment was 20 minutes and more than 50% was on counseling and review of test results  NHeath Lark MD 08/22/2017 3:17 PM

## 2017-08-22 NOTE — Patient Instructions (Signed)
[facility] Discharge Instructions for Patients Receiving Chemotherapy  Today you received the following chemotherapy agents: Cisplatin.   To help prevent nausea and vomiting after your treatment, we encourage you to take your nausea medication as directed.  If you develop nausea and vomiting that is not controlled by your nausea medication, call the clinic.   BELOW ARE SYMPTOMS THAT SHOULD BE REPORTED IMMEDIATELY:  *FEVER GREATER THAN 100.5 F  *CHILLS WITH OR WITHOUT FEVER  NAUSEA AND VOMITING THAT IS NOT CONTROLLED WITH YOUR NAUSEA MEDICATION  *UNUSUAL SHORTNESS OF BREATH  *UNUSUAL BRUISING OR BLEEDING  TENDERNESS IN MOUTH AND THROAT WITH OR WITHOUT PRESENCE OF ULCERS  *URINARY PROBLEMS  *BOWEL PROBLEMS  UNUSUAL RASH Items with * indicate a potential emergency and should be followed up as soon as possible.  Feel free to call the clinic should you have any questions or concerns. The clinic phone number is [phone].  Please show the CHEMO ALERT CARD at check-in to the Emergency Department and triage nurse.   Cisplatin injection What is this medicine? CISPLATIN (SIS pla tin) is a chemotherapy drug. It targets fast dividing cells, like cancer cells, and causes these cells to die. This medicine is used to treat many types of cancer like bladder, ovarian, and testicular cancers. This medicine may be used for other purposes; ask your health care provider or pharmacist if you have questions. COMMON BRAND NAME(S): Platinol, Platinol -AQ What should I tell my health care provider before I take this medicine? They need to know if you have any of these conditions: -blood disorders -hearing problems -kidney disease -recent or ongoing radiation therapy -an unusual or allergic reaction to cisplatin, carboplatin, other chemotherapy, other medicines, foods, dyes, or preservatives -pregnant or trying to get pregnant -breast-feeding How should I use this  medicine? This drug is given as an infusion into a vein. It is administered in a hospital or clinic by a specially trained health care professional. Talk to your pediatrician regarding the use of this medicine in children. Special care may be needed. Overdosage: If you think you have taken too much of this medicine contact a poison control center or emergency room at once. NOTE: This medicine is only for you. Do not share this medicine with others. What if I miss a dose? It is important not to miss a dose. Call your doctor or health care professional if you are unable to keep an appointment. What may interact with this medicine? -dofetilide -foscarnet -medicines for seizures -medicines to increase blood counts like filgrastim, pegfilgrastim, sargramostim -probenecid -pyridoxine used with altretamine -rituximab -some antibiotics like amikacin, gentamicin, neomycin, polymyxin B, streptomycin, tobramycin -sulfinpyrazone -vaccines -zalcitabine Talk to your doctor or health care professional before taking any of these medicines: -acetaminophen -aspirin -ibuprofen -ketoprofen -naproxen This list may not describe all possible interactions. Give your health care provider a list of all the medicines, herbs, non-prescription drugs, or dietary supplements you use. Also tell them if you smoke, drink alcohol, or use illegal drugs. Some items may interact with your medicine. What should I watch for while using this medicine? Your condition will be monitored carefully while you are receiving this medicine. You will need important blood work done while you are taking this medicine. This drug may make you feel generally unwell. This is not uncommon, as chemotherapy can affect healthy cells as well as cancer cells. Report any side effects. Continue your course of treatment even though you feel ill unless your doctor tells you to   stop. In some cases, you may be given additional medicines to help with side  effects. Follow all directions for their use. Call your doctor or health care professional for advice if you get a fever, chills or sore throat, or other symptoms of a cold or flu. Do not treat yourself. This drug decreases your body's ability to fight infections. Try to avoid being around people who are sick. This medicine may increase your risk to bruise or bleed. Call your doctor or health care professional if you notice any unusual bleeding. Be careful brushing and flossing your teeth or using a toothpick because you may get an infection or bleed more easily. If you have any dental work done, tell your dentist you are receiving this medicine. Avoid taking products that contain aspirin, acetaminophen, ibuprofen, naproxen, or ketoprofen unless instructed by your doctor. These medicines may hide a fever. Do not become pregnant while taking this medicine. Women should inform their doctor if they wish to become pregnant or think they might be pregnant. There is a potential for serious side effects to an unborn child. Talk to your health care professional or pharmacist for more information. Do not breast-feed an infant while taking this medicine. Drink fluids as directed while you are taking this medicine. This will help protect your kidneys. Call your doctor or health care professional if you get diarrhea. Do not treat yourself. What side effects may I notice from receiving this medicine? Side effects that you should report to your doctor or health care professional as soon as possible: -allergic reactions like skin rash, itching or hives, swelling of the face, lips, or tongue -signs of infection - fever or chills, cough, sore throat, pain or difficulty passing urine -signs of decreased platelets or bleeding - bruising, pinpoint red spots on the skin, black, tarry stools, nosebleeds -signs of decreased red blood cells - unusually weak or tired, fainting spells, lightheadedness -breathing  problems -changes in hearing -gout pain -low blood counts - This drug may decrease the number of white blood cells, red blood cells and platelets. You may be at increased risk for infections and bleeding. -nausea and vomiting -pain, swelling, redness or irritation at the injection site -pain, tingling, numbness in the hands or feet -problems with balance, movement -trouble passing urine or change in the amount of urine Side effects that usually do not require medical attention (report to your doctor or health care professional if they continue or are bothersome): -changes in vision -loss of appetite -metallic taste in the mouth or changes in taste This list may not describe all possible side effects. Call your doctor for medical advice about side effects. You may report side effects to FDA at 1-800-FDA-1088. Where should I keep my medicine? This drug is given in a hospital or clinic and will not be stored at home. NOTE: This sheet is a summary. It may not cover all possible information. If you have questions about this medicine, talk to your doctor, pharmacist, or health care provider.  2018 [employer] Standard (2007-04-14 14:40:54)   

## 2017-08-22 NOTE — Assessment & Plan Note (Signed)
She has poorly controlled pain when she arrived to the cancer center With additional interim venous pain medicine, her pain is well controlled I recommend increasing MS Contin to 3 times a day along with hydrocodone as needed for breakthrough pain I will reassess pain control in the next visit

## 2017-08-27 ENCOUNTER — Inpatient Hospital Stay: Payer: PPO

## 2017-08-27 ENCOUNTER — Telehealth: Payer: Self-pay | Admitting: Hematology and Oncology

## 2017-08-27 ENCOUNTER — Telehealth: Payer: Self-pay | Admitting: *Deleted

## 2017-08-27 ENCOUNTER — Inpatient Hospital Stay (HOSPITAL_BASED_OUTPATIENT_CLINIC_OR_DEPARTMENT_OTHER): Payer: PPO | Admitting: Hematology and Oncology

## 2017-08-27 DIAGNOSIS — Z5111 Encounter for antineoplastic chemotherapy: Secondary | ICD-10-CM | POA: Diagnosis not present

## 2017-08-27 DIAGNOSIS — C519 Malignant neoplasm of vulva, unspecified: Secondary | ICD-10-CM

## 2017-08-27 DIAGNOSIS — G893 Neoplasm related pain (acute) (chronic): Secondary | ICD-10-CM | POA: Diagnosis not present

## 2017-08-27 DIAGNOSIS — T451X5A Adverse effect of antineoplastic and immunosuppressive drugs, initial encounter: Secondary | ICD-10-CM

## 2017-08-27 DIAGNOSIS — D509 Iron deficiency anemia, unspecified: Secondary | ICD-10-CM

## 2017-08-27 DIAGNOSIS — Z7189 Other specified counseling: Secondary | ICD-10-CM

## 2017-08-27 DIAGNOSIS — R110 Nausea: Secondary | ICD-10-CM

## 2017-08-27 LAB — COMPREHENSIVE METABOLIC PANEL
ALBUMIN: 3.3 g/dL — AB (ref 3.5–5.0)
ALT: 15 U/L (ref 0–44)
AST: 15 U/L (ref 15–41)
Alkaline Phosphatase: 85 U/L (ref 38–126)
Anion gap: 12 (ref 5–15)
BUN: 15 mg/dL (ref 8–23)
CHLORIDE: 99 mmol/L (ref 98–111)
CO2: 29 mmol/L (ref 22–32)
CREATININE: 0.78 mg/dL (ref 0.44–1.00)
Calcium: 9.3 mg/dL (ref 8.9–10.3)
GFR calc Af Amer: >60 mL/min (ref >60)
GLUCOSE: 112 mg/dL — AB (ref 70–99)
POTASSIUM: 4 mmol/L (ref 3.5–5.1)
Sodium: 140 mmol/L (ref 135–145)
Total Bilirubin: 0.8 mg/dL (ref 0.3–1.2)
Total Protein: 6.5 g/dL (ref 6.5–8.1)

## 2017-08-27 LAB — CBC WITH DIFFERENTIAL/PLATELET
BASOS ABS: 0 10*3/uL (ref 0.0–0.1)
BASOS PCT: 0 %
EOS PCT: 1 %
Eosinophils Absolute: 0.1 10*3/uL (ref 0.0–0.5)
HEMATOCRIT: 33.2 % — AB (ref 34.8–46.6)
Hemoglobin: 10.7 g/dL — ABNORMAL LOW (ref 11.6–15.9)
LYMPHS PCT: 10 %
Lymphs Abs: 0.8 10*3/uL — ABNORMAL LOW (ref 0.9–3.3)
MCH: 29.2 pg (ref 25.1–34.0)
MCHC: 32.2 g/dL (ref 31.5–36.0)
MCV: 90.7 fL (ref 79.5–101.0)
Monocytes Absolute: 0.7 10*3/uL (ref 0.1–0.9)
Monocytes Relative: 9 %
Neutro Abs: 6.3 10*3/uL (ref 1.5–6.5)
Neutrophils Relative %: 80 %
Platelets: 146 10*3/uL (ref 145–400)
RBC: 3.66 MIL/uL — AB (ref 3.70–5.45)
RDW: 17.5 % — AB (ref 11.2–14.5)
WBC: 7.9 10*3/uL (ref 3.9–10.3)

## 2017-08-27 LAB — MAGNESIUM: MAGNESIUM: 1.3 mg/dL — AB (ref 1.7–2.4)

## 2017-08-27 LAB — SAMPLE TO BLOOD BANK

## 2017-08-27 MED ORDER — PROCHLORPERAZINE MALEATE 10 MG PO TABS: 10.0000 mg | ORAL_TABLET | Freq: Four times a day (QID) | ORAL | 9 refills | Status: AC | PRN

## 2017-08-27 MED ORDER — HEPARIN SOD (PORK) LOCK FLUSH 100 UNIT/ML IV SOLN
250.0000 [IU] | Freq: Once | INTRAVENOUS | Status: AC
  Administered 2017-08-27: 250 [IU]
  Filled 2017-08-27: qty 5

## 2017-08-27 MED ORDER — ONDANSETRON HCL 8 MG PO TABS: 8.0000 mg | ORAL_TABLET | Freq: Three times a day (TID) | ORAL | 3 refills | Status: AC | PRN

## 2017-08-27 MED ORDER — MORPHINE SULFATE 30 MG PO TABS: 30.0000 mg | ORAL_TABLET | Freq: Four times a day (QID) | ORAL | 0 refills | Status: DC | PRN

## 2017-08-27 MED ORDER — MORPHINE SULFATE ER 30 MG PO TBCR: 30.0000 mg | EXTENDED_RELEASE_TABLET | Freq: Three times a day (TID) | ORAL | 0 refills | Status: DC

## 2017-08-27 MED ORDER — SODIUM CHLORIDE 0.9% FLUSH
10.0000 mL | Freq: Once | INTRAVENOUS | Status: AC
  Administered 2017-08-27: 10 mL
  Filled 2017-08-27: qty 10

## 2017-08-27 NOTE — Telephone Encounter (Signed)
Gave avs and calendar ° °

## 2017-08-27 NOTE — Telephone Encounter (Signed)
-----   Message from Heath Lark, MD sent at 08/27/2017 11:31 AM EDT ----- Regarding: magnesium Call patient/son to increase magnesium to three times a day ----- Message ----- From: Interface, [facility] [place] Sent: 08/27/2017  10:30 AM To: Heath Lark, MD

## 2017-08-27 NOTE — Telephone Encounter (Signed)
LM on [name]'s phone with instructions to increase magnesium to 3 times per day.   LM on [name]'s home phone with same message.

## 2017-08-28 ENCOUNTER — Encounter: Payer: Self-pay | Admitting: Hematology and Oncology

## 2017-08-28 DIAGNOSIS — Z96642 Presence of left artificial hip joint: Secondary | ICD-10-CM | POA: Diagnosis not present

## 2017-08-28 DIAGNOSIS — D649 Anemia, unspecified: Secondary | ICD-10-CM | POA: Diagnosis not present

## 2017-08-28 DIAGNOSIS — N764 Abscess of vulva: Secondary | ICD-10-CM | POA: Diagnosis not present

## 2017-08-28 DIAGNOSIS — Z79891 Long term (current) use of opiate analgesic: Secondary | ICD-10-CM | POA: Diagnosis not present

## 2017-08-28 DIAGNOSIS — C519 Malignant neoplasm of vulva, unspecified: Secondary | ICD-10-CM | POA: Diagnosis not present

## 2017-08-28 DIAGNOSIS — M1990 Unspecified osteoarthritis, unspecified site: Secondary | ICD-10-CM | POA: Diagnosis not present

## 2017-08-28 DIAGNOSIS — I10 Essential (primary) hypertension: Secondary | ICD-10-CM | POA: Diagnosis not present

## 2017-08-28 DIAGNOSIS — R110 Nausea: Secondary | ICD-10-CM | POA: Insufficient documentation

## 2017-08-28 DIAGNOSIS — T451X5A Adverse effect of antineoplastic and immunosuppressive drugs, initial encounter: Secondary | ICD-10-CM

## 2017-08-28 DIAGNOSIS — Z9181 History of falling: Secondary | ICD-10-CM | POA: Diagnosis not present

## 2017-08-28 DIAGNOSIS — Z7984 Long term (current) use of oral hypoglycemic drugs: Secondary | ICD-10-CM | POA: Diagnosis not present

## 2017-08-28 DIAGNOSIS — Z483 Aftercare following surgery for neoplasm: Secondary | ICD-10-CM | POA: Diagnosis not present

## 2017-08-28 DIAGNOSIS — E119 Type 2 diabetes mellitus without complications: Secondary | ICD-10-CM | POA: Diagnosis not present

## 2017-08-28 DIAGNOSIS — E559 Vitamin D deficiency, unspecified: Secondary | ICD-10-CM | POA: Diagnosis not present

## 2017-08-28 DIAGNOSIS — Z7982 Long term (current) use of aspirin: Secondary | ICD-10-CM | POA: Diagnosis not present

## 2017-08-28 DIAGNOSIS — Z96651 Presence of right artificial knee joint: Secondary | ICD-10-CM | POA: Diagnosis not present

## 2017-08-28 NOTE — Assessment & Plan Note (Signed)
I have reviewed pictures that she took with a cell phone in regards to the wound area on her vulval Overall, her disease is stable She will continue aggressive wound care at home We will continue weekly cisplatin She will proceed with treatment this week and then give her a week break next week I will see her back in 2 weeks for blood count and supportive care I recommend minimum 2 to 3 months of treatment before reevaluate with imaging study

## 2017-08-28 NOTE — Assessment & Plan Note (Signed)
She has poorly controlled pain We have extensive discussion about pain management I recommend her son to start a pain medicine diarrhea For now, I recommend increasing MS Contin to 3 times a day I recommend discontinuation of hydrocodone and switch her to immediate release morphine as needed for better pain control I warned her about risk of sedation, nausea and constipation

## 2017-08-28 NOTE — Assessment & Plan Note (Signed)
She has chemotherapy-induced nausea She was not taking antiemetics as prescribed I recommend her to take antiemetics as needed and refill her prescriptions today

## 2017-08-28 NOTE — Progress Notes (Signed)
[place] OFFICE PROGRESS NOTE  Patient Care Team: Mikey Kirschner, MD as PCP - General (Family Medicine)  ASSESSMENT & PLAN:  Vulva cancer [facility]) I have reviewed pictures that she took with a cell phone in regards to the wound area on her vulval Overall, her disease is stable She will continue aggressive wound care at home We will continue weekly cisplatin She will proceed with treatment this week and then give her a week break next week I will see her back in 2 weeks for blood count and supportive care I recommend minimum 2 to 3 months of treatment before reevaluate with imaging study   Iron deficiency anemia She has multifactorial anemia including iron deficiency anemia She had received multiple units of blood transfusion and intravenous iron We will continue to monitor her blood counts carefully and to administer intravenous iron as needed  Hypomagnesemia She has chronic hypomagnesemia I recommend oral magnesium replacement therapy and we will monitor her magnesium level carefully  Cancer associated pain She has poorly controlled pain We have extensive discussion about pain management I recommend her son to start a pain medicine diarrhea For now, I recommend increasing MS Contin to 3 times a day I recommend discontinuation of hydrocodone and switch her to immediate release morphine as needed for better pain control I warned her about risk of sedation, nausea and constipation  Chemotherapy-induced nausea She has chemotherapy-induced nausea She was not taking antiemetics as prescribed I recommend her to take antiemetics as needed and refill her prescriptions today  Goals of care, counseling/discussion We had extensive discussion about supportive care and goals of care Ultimately, the patient wants to proceed with chemotherapy as directed I have given the patient and her son extensive information and recommend close follow-up for supportive care She has  advanced home care to help manage her PICC line and wound care She is instructed to take pictures of her lesion whenever she has dressing changes   No orders of the defined types were placed in this encounter.   INTERVAL HISTORY: Please see below for problem oriented charting. She returns with her son for further follow-up Since last time I saw her, her pain is not adequately controlled despite increasing MS Contin to 3 times a day She has been complaining of nausea but no vomiting She was not taking antiemetics as instructed She denies constipation She took various pictures of her vulvar lesion on her cell phone and show it to me She denies significant bleeding No recent fever or chills Denies peripheral neuropathy from treatment  SUMMARY OF ONCOLOGIC HISTORY: Oncology History   PD-L1 neg     Vulva cancer ([place])   02/22/2016 Pathology Results    Labium, biopsy, right mid - INVASIVE SQUAMOUS CELL CARCINOMA.    02/23/2016 Imaging    Ct abdomen and pelvis Mild asymmetric soft tissue thickening in the right vulvar/perineal region, likely corresponding to the patient's known vulvar cancer, poorly evaluated on CT.  No evidence of metastatic disease.  Status post hysterectomy.    02/27/2016 Pathology Results    1. Lymph nodes, regional resection, right inguinal - METASTATIC CARCINOMA IN 1 OF 6 LYMPH NODES (1/6). 2. Vulva, excision, right - INVASIVE KERATINIZING SQUAMOUS CELL CARCINOMA, MODERATELY DIFFERENTIATED, SPANNING 2.1 CM WITH A DEPTH OF INVASION OF 0.6 CM. - THE SURGICAL RESECTION MARGIN ARE NEGATIVE FOR CARCINOMA. - DIFFUSE LICHEN SCLEROSUS ET ATROPHICUS. - SEE ONCOLOGY TABLE BELOW. Microscopic Comment 2. VULVA Specimen: Right vulvectomy. Procedure: Resection Lymph node sampling performed:  Yes. Tumor site: Right vulva Tumor focality: Carcinoma is unifocal. Maximum tumor size (cm): 2.1 cm (gross measurement). Histologic type: Keratinizing squmaous cell  carcinoma. Grade: Moderately differentiated. Depth of stromal invasion (mm): 6 mm (glass slide measurement) Margins: Negative for carcinoma. Distance of carcinoma from nearest margin: 0.6 cm to the 3 o'clock margin. Lymph - Vascular invasion: Not identified. Lymph nodes: number examined: 6 Number positive: 1 size of metastatic focus: 0.3 cm (glass slide measurement) extracapsular extension: Not identified. TNM code: pT1b, pN1a FIGO Stage (based on pathologic findings, needs clinical correlation): IIIA Comment: Grossly, there is a 2.1 cm sessile papular lesion which on histologic evaluation reveals invasive keratinizing squamous cell carcinoma invading to a depth of 0.6 cm as measured from a glass slide. In addition, there is diffuse lichen sclerosus et atrophicus. The surgical resection margins are negative for invasive caricnoma. Additional studies can be performed upon clinical request.    02/27/2016 Surgery    Surgery: Right radical partial vulvectomy, right inguinal lymph node dissection  Surgeons:  Imagene Gurney A. Alycia Rossetti, MD; Lahoma Crocker, MD   Pathology: Right vulva with resection margin with suture at 12:00. Right inguinal nodes.   Operative findings: 1.5 cm fungating erosive lesion of the right vulva approximately 2 cm from the urethra. Evidence of lichen sclerosis throughout the entire vulvar. No other ulcerative lesion suspicious for vulvar carcinoma. Shotty palpable lymphadenopathy in the right inguinal space.      04/01/2016 Imaging    Ct chest 1. Multiple pulmonary nodules as detailed above, generally in the 4 mm and less average diameter range although with 1 right lower lobe subpleural nodule measuring about 8 mm in diameter. These likely warrant surveillance. 2. Hypodense right thyroid nodule, 1.1 cm in long axis. Consider further evaluation with thyroid ultrasound. If patient is clinically hyperthyroid, consider nuclear medicine thyroid uptake and scan. 3. Stable  hypodense lateral splenic lesion, likely a cyst. 4. Aortic and coronary atherosclerosis.     04/02/2016 Pathology Results    Labium, biopsy, left minora INVASIVE SQUAMOUS CELL CARCINOMA    04/09/2016 Pathology Results    1. Vulva, vulvectomy, left - INVASIVE SQUAMOUS CELL CARCINOMA, NON-KERATINIZING, SPANNING 2.4 CM IN WIDTH X 0.4 CM IN DEPTH. - CARCINOMA IS BROADLY PRESENT AT THE 6 O'CLOCK MARGIN OF SPECIMEN #1. - SEE ONCOLOGY TABLE BELOW. 2. Vulva, excision, lateral margin - SQUAMOUS LINED EPITHELIUM WITH LICHEN SCLEROSUS ET ATROPHICUS. 3. Vulva, excision, medial margin - SQUAMOUS LINED EPITHELIUM WITH LICHEN SCLEROSUS ET ATROPHICUS. Microscopic Comment 1. VULVA Specimen: Left vulva. Procedure: Left vulvectomy with additional lateral and medial margins. Lymph node sampling performed: Not on current case. Tumor site: Left vulva. Tumor focality: Unifocal. Maximum tumor size (cm): 2.4 cm in width (gross measurement) Histologic type: Squamous cell carcinoma, non-keratinizing. Grade: High grade. Depth of stromal invasion (mm): 0.4 cm (4 mm), glass slide measurement. Margins: Invasive carcinoma broadly present at the 6 o'clock margin of specimen #1. Lymph - Vascular invasion: Not identified. Lymph nodes: None examined. TNM code: pT1b, pNX FIGO Stage (based on pathologic findings, needs clinical correlation): IB    04/09/2016 Surgery    Surgery: Partial radical left vulvectomy  Surgeons:  Donaciano Eva, MD  Pathology: 1/ left vulva with marking stitch at 12 o'clock anterior, 2/ lateral (thigh) margin with stitch at true new lateral margin. 3/ medial (vaginal) margin with marking stitch at true new medial margin. Operative findings: 2cm left mid labial nodular lesion      05/03/2016 Imaging    Ct chest 1.  No evidence of acute pulmonary embolus. 2. Calcified coronary artery atherosclerosis. Negative visible aorta. 3. Pulmonary atelectasis with no other acute pulmonary  process. The small subpleural pulmonary nodules seen by CT last month are stable.     06/05/2016 - 07/25/2016 Radiation Therapy    Radiation treatment dates:   06/05/16 - 07/25/16  Site/dose:   Pelvis treated to 45 Gy in 25 fractions, then vulvar region Boosted an additional 18 Gy in 9 fractions.  Beams/energy:   Pelvis:  IMRT  //  6X                              Boost:  IMRT  //  6X     09/30/2016 Pathology Results    Vulva, biopsy, anterior vulva INVASIVE SQUAMOUS CELL CARCINOMA    10/07/2016 Imaging    Ct chest 1. Stable bilateral pulmonary nodules, likely benign but will require surveillance. Recommend followup noncontrast chest CT in 6-12 months. 2. No mediastinal or hilar mass or adenopathy. 3. Stable coronary artery calcifications.  Aortic Atherosclerosis (ICD10-I70.0).    10/17/2016 Pathology Results    Vulva, vulvectomy, anterior - INVASIVE SQUAMOUS CELL CARCINOMA, SPANNING 2.7 CM AND EXTENDING TO A DEPTH OF 0.3 CM. - RESECTION MARGINS ARE NEGATIVE. - SEE ONCOLOGY TABLE. Microscopic Comment VULVA Specimen: Anterior vulva. Procedure: Partial anterior vulvectomy. Lymph node sampling performed: None. Tumor site: Anterior vulva. Tumor focality: Unifocal. Maximum tumor size (cm): 2.7 cm. Histologic type: Squamous cell carcinoma, non-keratinizing. Grade: G2, moderately differentiated. Depth of stromal invasion (mm): 3 mm. Margins: Negative Distance of carcinoma from nearest margin: Invasive tumor is 1 mm from urethral margin. Lymph - Vascular invasion: Not identified. Lymph nodes: None. TNM code: pT1b, pNX FIGO Stage (based on pathologic findings, needs clinical correlation): IB    10/17/2016 Surgery    Surgery: Partial radical anterior vulvectomy  Surgeons:  Donaciano Eva, MD  Pathology: anterior vulva with marking stitch at anterior clitoral margin o'clock  Operative findings: 3cm raised vulvar cancer (leukoplakia) at midline and right vulva extending to  within 0.5cm of anterior urethral meatus      02/17/2017 Pathology Results    Vulva, biopsy - PARAKERATOSIS AND ACUTE INFLAMMATION. - PAS IS NEGATIVE FOR FUNGAL ORGANISMS. - NO DYSPLASIA OR MALIGNANCY.    05/01/2017 Imaging    Ct abdomen and pelvis New 4 x 3 cm rim enhancing lesion central air bubbles in the right vulvar region, which could represent locally recurrent tumor with necrosis or abscess.  No evidence of metastatic disease within the abdomen or pelvis.    05/15/2017 Pathology Results    1. Labium, biopsy, right posterior labia majora - PARAKERATOSIS. - NO DYSPLASIA OR MALIGNANCY. 2. Vulva, excision, anterior right mons pubis lesion - INVASIVE MODERATE TO POORLY DIFFERENTIATED SQUAMOUS CELL CARCINOMA. - ASSOCIATED ULCER, NECROSIS AND ACUTE INFLAMMATION. - SEE COMMENT. 3. Labium, biopsy, left mid labia majora - POORLY DIFFERENTIATED SQUAMOUS CELL CARCINOMA. - SEE COMMENT. Microscopic Comment 2. The specimen is disrupted hampering assessment of margin orientation and size, however, carcinoma involves almost the entire specimen (thus size estimated to be at least 3.1 cm with depth of 1 cm) and extends to the majority of the presumed lateral and deep margins. There is perineural invasion. 3. The carcinoma is largely beneath the epidermis and extends to the deep and lateral edges of the biopsy.    05/15/2017 Surgery    Surgery: debridement of anterior vulvar abscess, vulvar biopsies.   Surgeons:  Rossi, Emma Caroline, MD  Pathology: right posterior labia majora, anterior mons pubis, left mid labia majora  Operative findings: 6cm cavitating anterior mons pubis indurated lesion. It is approximately 3.5cm from the urethral meatus to inferior aspect of mass. Two indurated 1-2cm nodular areas (one on the posterior right labia majora and one on the mid left labia majora). No periurethral lesions.      06/02/2017 PET scan    Stable hypermetabolic soft tissue density in right  vulva, which may be due to postoperative change from recent excision, although residual malignancy and infection cannot be excluded.  No evidence of local or distant metastatic disease.    06/06/2017 Procedure    Successful right arm power PICC line placement with ultrasound and fluoroscopic guidance. The catheter is ready for use.    06/12/2017 Cancer Staging    Staging form: Vulva, AJCC 8th Edition - Clinical: Stage II (cT2, cN0, cM0) - Signed by Gorsuch, Ni, MD on 06/12/2017    06/13/2017 - 07/25/2017 Chemotherapy    The patient had carboplatin and Taxol    08/02/2017 - 08/10/2017 Hospital Admission    She was hospitalized due to severe vulvar pain and infection.    08/03/2017 Imaging    Overall there has been progression of disease involving bilateral vulva. On the right this demonstrates progressive anterior involvement, ulceration and extension into the mons pubis. This also demonstrates progressive posterior extension into the medial upper thigh.     Genetic Testing    Patient has genetic testing done for PD-L1 clone [id number]. Results revealed: PD-L1 staining in tumor cells (TC): 0% PD-L1 staining in tumor-associated immune cells (IC): 5% PD-L1 combined positive score (CPS): <1%    08/22/2017 -  Chemotherapy    The patient had weekly cisplatin     REVIEW OF SYSTEMS:   Constitutional: Denies fevers, chills or abnormal weight loss Eyes: Denies blurriness of vision Ears, nose, mouth, throat, and face: Denies mucositis or sore throat Respiratory: Denies cough, dyspnea or wheezes Cardiovascular: Denies palpitation, chest discomfort or lower extremity swelling Lymphatics: Denies new lymphadenopathy or easy bruising Neurological:Denies numbness, tingling or new weaknesses Behavioral/Psych: Mood is stable, no new changes  All other systems were reviewed with the patient and are negative.  I have reviewed the past medical history, past surgical history, social history and family history  with the patient and they are unchanged from previous note.  ALLERGIES:  is allergic to oxycodone; celebrex [celecoxib]; doxycycline; iron; lodine [etodolac]; nabumetone; tetracycline; and xarelto [rivaroxaban].  MEDICATIONS:  Current Outpatient Medications  Medication Sig Dispense Refill  . Amino Acids-Protein Hydrolys (FEEDING SUPPLEMENT, PRO-STAT SUGAR FREE 64,) LIQD Take 30 mLs by mouth 2 (two) times daily. 900 mL 0  . blood glucose meter kit and supplies KIT Dispense based on patient and insurance preference. Use up to three times daily as directed (FOR ICD-10 E11.9). 1 each 0  . collagenase (SANTYL) ointment Apply 1 application topically daily. Apply to vulva wound bed once daily with dressing changes (Patient not taking: Reported on 08/03/2017) 15 g 1  . docusate sodium (COLACE) 100 MG capsule Take 1 capsule (100 mg total) by mouth 2 (two) times daily. 10 capsule 0  . gabapentin (NEURONTIN) 100 MG capsule TAKE 1 CAPSULE BY MOUTH THREE TIMES A DAY. 270 capsule 1  . magnesium oxide (MAG-OX) 400 (241.3 Mg) MG tablet Take 1 tablet (400 mg total) by mouth 2 (two) times daily. 60 tablet 3  . metFORMIN (GLUCOPHAGE) 500 MG tablet TAKE (1) TABLET BY   MOUTH TWICE DAILY. 180 tablet 0  . morphine (MS CONTIN) 30 MG 12 hr tablet Take 1 tablet (30 mg total) by mouth every 8 (eight) hours. 90 tablet 0  . morphine (MSIR) 30 MG tablet Take 1 tablet (30 mg total) by mouth every 6 (six) hours as needed for severe pain. 90 tablet 0  . ondansetron (ZOFRAN) 8 MG tablet Take 1 tablet (8 mg total) by mouth every 8 (eight) hours as needed for nausea. 60 tablet 3  . polyethylene glycol (MIRALAX / GLYCOLAX) packet Take 17 g by mouth daily. 14 each 0  . prochlorperazine (COMPAZINE) 10 MG tablet Take 1 tablet (10 mg total) by mouth every 6 (six) hours as needed for nausea or vomiting. 60 tablet 9  . sodium chloride flush 0.9 % SOLN injection Inject 10 mLs into the vein daily. 30 Syringe 9   No current  facility-administered medications for this visit.     PHYSICAL EXAMINATION: ECOG PERFORMANCE STATUS: 2 - Symptomatic, <50% confined to bed  Vitals:   08/27/17 1039  BP: 108/63  Pulse: (!) 106  Resp: 18  Temp: 97.6 F (36.4 C)  SpO2: 96%   Filed Weights   I have reviewed various pictures of her vulvar lesion.  She still have granulation tissue over the ulcerated lesion No active bleeding is seen GENERAL:alert, no distress and comfortable SKIN: skin color, texture, turgor are normal, no rashes or significant lesions EYES: normal, Conjunctiva are pink and non-injected, sclera clear OROPHARYNX:no exudate, no erythema and lips, buccal mucosa, and tongue normal  NECK: supple, thyroid normal size, non-tender, without nodularity LYMPH:  no palpable lymphadenopathy in the cervical, axillary or inguinal LUNGS: clear to auscultation and percussion with normal breathing effort HEART: regular rate & rhythm with soft systolic murmurs and no lower extremity edema ABDOMEN:abdomen soft, non-tender and normal bowel sounds Musculoskeletal:no cyanosis of digits and no clubbing  NEURO: alert & oriented x 3 with fluent speech, no focal motor/sensory deficits  LABORATORY DATA:  I have reviewed the data as listed    Component Value Date/Time   NA 140 08/27/2017 1025   NA 144 09/30/2016 1505   K 4.0 08/27/2017 1025   K 4.4 09/30/2016 1505   CL 99 08/27/2017 1025   CO2 29 08/27/2017 1025   CO2 27 09/30/2016 1505   GLUCOSE 112 (H) 08/27/2017 1025   GLUCOSE 101 09/30/2016 1505   BUN 15 08/27/2017 1025   BUN 16.7 09/30/2016 1505   CREATININE 0.78 08/27/2017 1025   CREATININE 0.88 07/04/2017 0821   CREATININE 0.8 09/30/2016 1505   CALCIUM 9.3 08/27/2017 1025   CALCIUM 9.8 09/30/2016 1505   PROT 6.5 08/27/2017 1025   PROT 7.3 09/30/2016 1505   ALBUMIN 3.3 (L) 08/27/2017 1025   ALBUMIN 3.6 09/30/2016 1505   AST 15 08/27/2017 1025   AST 12 07/04/2017 0821   AST 27 09/30/2016 1505   ALT 15  08/27/2017 1025   ALT 9 07/04/2017 0821   ALT 26 09/30/2016 1505   ALKPHOS 85 08/27/2017 1025   ALKPHOS 76 09/30/2016 1505   BILITOT 0.8 08/27/2017 1025   BILITOT 0.3 07/04/2017 0821   BILITOT 0.49 09/30/2016 1505   GFRNONAA >60 08/27/2017 1025   GFRNONAA >60 07/04/2017 0821   GFRAA >60 08/27/2017 1025   GFRAA >60 07/04/2017 0821    No results found for: SPEP, UPEP  Lab Results  Component Value Date   WBC 7.9 08/27/2017   NEUTROABS 6.3 08/27/2017   HGB 10.7 (L) 08/27/2017     HCT 33.2 (L) 08/27/2017   MCV 90.7 08/27/2017   PLT 146 08/27/2017      Chemistry      Component Value Date/Time   NA 140 08/27/2017 1025   NA 144 09/30/2016 1505   K 4.0 08/27/2017 1025   K 4.4 09/30/2016 1505   CL 99 08/27/2017 1025   CO2 29 08/27/2017 1025   CO2 27 09/30/2016 1505   BUN 15 08/27/2017 1025   BUN 16.7 09/30/2016 1505   CREATININE 0.78 08/27/2017 1025   CREATININE 0.88 07/04/2017 0821   CREATININE 0.8 09/30/2016 1505      Component Value Date/Time   CALCIUM 9.3 08/27/2017 1025   CALCIUM 9.8 09/30/2016 1505   ALKPHOS 85 08/27/2017 1025   ALKPHOS 76 09/30/2016 1505   AST 15 08/27/2017 1025   AST 12 07/04/2017 0821   AST 27 09/30/2016 1505   ALT 15 08/27/2017 1025   ALT 9 07/04/2017 0821   ALT 26 09/30/2016 1505   BILITOT 0.8 08/27/2017 1025   BILITOT 0.3 07/04/2017 0821   BILITOT 0.49 09/30/2016 1505       RADIOGRAPHIC STUDIES: I have personally reviewed the radiological images as listed and agreed with the findings in the report. Ct Pelvis W Contrast  Addendum Date: 08/04/2017   ADDENDUM REPORT: 08/04/2017 10:07 ADDENDUM: The following is a comparison to PET-CT from 06/02/2017. The ulcerative mass involving the bulb of and extending into the vulva measures 5.7 by 3.4 by 4.0 cm. On the previous exam this measured 3.7 x 2.9 by 3.0 cm. Additionally, there is progressive enhancing tumor which extends posteriorly into the right upper thigh with a maximum thickness of 0.9  cm, image 116/4. 0.7 cm. There is a enlarging enhancing nodule within the subcutaneous soft tissues of the left vulva measuring 1.2 x 1.0 cm. Previously this measured 0.7 cm in maximum diameter. More anteriorly within the left vulva is a enhancing mass measuring 1.2 x 2.6 cm, image 114/4. Previously this measured 1.6 x 1.2 cm. IMPRESSION: Overall there has been progression of disease involving bilateral vulva. On the right this demonstrates progressive anterior involvement, ulceration and extension into the mons pubis. This also demonstrates progressive posterior extension into the medial upper thigh. Electronically Signed   By: Kerby Moors M.D.   On: 08/04/2017 10:07   Result Date: 08/04/2017 CLINICAL DATA:  77 year old female with malignant neoplasm of the vulva. EXAM: CT PELVIS WITH CONTRAST TECHNIQUE: Multidetector CT imaging of the pelvis was performed using the standard protocol following the bolus administration of intravenous contrast. CONTRAST:  64m OMNIPAQUE IOHEXOL 300 MG/ML  SOLN COMPARISON:  CT of the abdomen pelvis dated 04/30/2017 FINDINGS: Urinary Tract: The visualized distal ureters and urinary bladder appear unremarkable. Bowel: There is sigmoid diverticulosis without active inflammatory changes. No dilated bowel in the pelvis. Vascular/Lymphatic: Mild atherosclerotic disease.  No adenopathy. Reproductive: Hysterectomy. There is a 3.6 x 2.9 cm nodular density in the skin and subcutaneous tissues of the right vulvar region. There is irregularity of the overlying skin which may represent ulceration. Multiple additional nodules noted in the volar region and extending into the perineal subcutaneous soft tissues and adjacent to the vaginal orifice and anterior to the rectum. For example there is a 2.6 x 1.2 cm nodular lesion over the left vulvar area and a 12 x 10 mm nodular lesion in the left perineum (series 4 image 112). There has been interval increase in the size of the vulvar lesion and  development of new vulvar and perineal  lesions compared to the prior CT. Other:  None Musculoskeletal: Total left hip arthroplasty. Osteopenia and degenerative changes of the spine. No acute osseous pathology. IMPRESSION: 1. Interval increase in the size and extent of vulvar mass with development of new vulvar and perineal lesions since the prior CT. 2. No evidence of metastatic disease within the pelvis. Electronically Signed: By: Anner Crete M.D. On: 08/03/2017 04:28    All questions were answered. The patient knows to call the clinic with any problems, questions or concerns. No barriers to learning was detected.  I spent 30 minutes counseling the patient face to face. The total time spent in the appointment was 40 minutes and more than 50% was on counseling and review of test results  Heath Lark, MD 08/28/2017 3:09 PM

## 2017-08-28 NOTE — Assessment & Plan Note (Signed)
She has multifactorial anemia including iron deficiency anemia She had received multiple units of blood transfusion and intravenous iron We will continue to monitor her blood counts carefully and to administer intravenous iron as needed

## 2017-08-28 NOTE — Assessment & Plan Note (Signed)
We had extensive discussion about supportive care and goals of care Ultimately, the patient wants to proceed with chemotherapy as directed I have given the patient and her son extensive information and recommend close follow-up for supportive care She has advanced home care to help manage her PICC line and wound care She is instructed to take pictures of her lesion whenever she has dressing changes

## 2017-08-28 NOTE — Assessment & Plan Note (Signed)
She has chronic hypomagnesemia I recommend oral magnesium replacement therapy and we will monitor her magnesium level carefully

## 2017-08-29 ENCOUNTER — Inpatient Hospital Stay: Payer: PPO

## 2017-08-29 VITALS — BP 111/62 | HR 78 | Temp 98.2°F | Resp 16

## 2017-08-29 DIAGNOSIS — D509 Iron deficiency anemia, unspecified: Secondary | ICD-10-CM

## 2017-08-29 DIAGNOSIS — C519 Malignant neoplasm of vulva, unspecified: Secondary | ICD-10-CM

## 2017-08-29 DIAGNOSIS — Z5111 Encounter for antineoplastic chemotherapy: Secondary | ICD-10-CM | POA: Diagnosis not present

## 2017-08-29 MED ORDER — SODIUM CHLORIDE 0.9 % IV SOLN
Freq: Once | INTRAVENOUS | Status: AC
  Administered 2017-08-29: 09:00:00 via INTRAVENOUS
  Filled 2017-08-29: qty 250

## 2017-08-29 MED ORDER — HYDROMORPHONE HCL 4 MG/ML IJ SOLN
2.0000 mg | Freq: Once | INTRAMUSCULAR | Status: AC
  Administered 2017-08-29: 2 mg via INTRAVENOUS
  Filled 2017-08-29: qty 1

## 2017-08-29 MED ORDER — ALTEPLASE 2 MG IJ SOLR
2.0000 mg | Freq: Once | INTRAMUSCULAR | Status: DC | PRN
  Filled 2017-08-29: qty 2

## 2017-08-29 MED ORDER — HYDROMORPHONE HCL 2 MG/ML IJ SOLN
INTRAMUSCULAR | Status: AC
  Filled 2017-08-29: qty 1

## 2017-08-29 MED ORDER — SODIUM CHLORIDE 0.9% FLUSH
3.0000 mL | Freq: Once | INTRAVENOUS | Status: DC | PRN
  Filled 2017-08-29: qty 10

## 2017-08-29 MED ORDER — PALONOSETRON HCL INJECTION 0.25 MG/5ML
0.2500 mg | Freq: Once | INTRAVENOUS | Status: AC
  Administered 2017-08-29: 0.25 mg via INTRAVENOUS

## 2017-08-29 MED ORDER — HEPARIN SOD (PORK) LOCK FLUSH 100 UNIT/ML IV SOLN
500.0000 [IU] | Freq: Once | INTRAVENOUS | Status: DC | PRN
  Filled 2017-08-29: qty 5

## 2017-08-29 MED ORDER — POTASSIUM CHLORIDE 2 MEQ/ML IV SOLN
Freq: Once | INTRAVENOUS | Status: AC
  Administered 2017-08-29: 10:00:00 via INTRAVENOUS
  Filled 2017-08-29: qty 10

## 2017-08-29 MED ORDER — SODIUM CHLORIDE 0.9 % IV SOLN
35.0000 mg/m2 | Freq: Once | INTRAVENOUS | Status: AC
  Administered 2017-08-29: 65 mg via INTRAVENOUS
  Filled 2017-08-29: qty 65

## 2017-08-29 MED ORDER — HEPARIN SOD (PORK) LOCK FLUSH 100 UNIT/ML IV SOLN
500.0000 [IU] | Freq: Once | INTRAVENOUS | Status: AC | PRN
  Administered 2017-08-29: 500 [IU]
  Filled 2017-08-29: qty 5

## 2017-08-29 MED ORDER — HEPARIN SOD (PORK) LOCK FLUSH 100 UNIT/ML IV SOLN
250.0000 [IU] | Freq: Once | INTRAVENOUS | Status: DC | PRN
  Filled 2017-08-29: qty 5

## 2017-08-29 MED ORDER — PALONOSETRON HCL INJECTION 0.25 MG/5ML
INTRAVENOUS | Status: AC
  Filled 2017-08-29: qty 5

## 2017-08-29 MED ORDER — SODIUM CHLORIDE 0.9% FLUSH
10.0000 mL | INTRAVENOUS | Status: DC | PRN
  Administered 2017-08-29: 10 mL
  Filled 2017-08-29: qty 10

## 2017-08-29 MED ORDER — SODIUM CHLORIDE 0.9% FLUSH
10.0000 mL | INTRAVENOUS | Status: DC | PRN
  Filled 2017-08-29: qty 10

## 2017-08-29 MED ORDER — SODIUM CHLORIDE 0.9 % IV SOLN
Freq: Once | INTRAVENOUS | Status: AC
  Administered 2017-08-29: 12:00:00 via INTRAVENOUS
  Filled 2017-08-29: qty 5

## 2017-08-29 NOTE — Patient Instructions (Signed)
Cancer Center Discharge Instructions for Patients Receiving Chemotherapy  Today you received the following chemotherapy agents: Cisplatin  To help prevent nausea and vomiting after your treatment, we encourage you to take your nausea medication  as prescribed.    If you develop nausea and vomiting that is not controlled by your nausea medication, call the clinic.   BELOW ARE SYMPTOMS THAT SHOULD BE REPORTED IMMEDIATELY:  *FEVER GREATER THAN 100.5 F  *CHILLS WITH OR WITHOUT FEVER  NAUSEA AND VOMITING THAT IS NOT CONTROLLED WITH YOUR NAUSEA MEDICATION  *UNUSUAL SHORTNESS OF BREATH  *UNUSUAL BRUISING OR BLEEDING  TENDERNESS IN MOUTH AND THROAT WITH OR WITHOUT PRESENCE OF ULCERS  *URINARY PROBLEMS  *BOWEL PROBLEMS  UNUSUAL RASH Items with * indicate a potential emergency and should be followed up as soon as possible.  Feel free to call the clinic should you have any questions or concerns. The clinic phone number is [phone].  Please show the CHEMO ALERT CARD at check-in to the Emergency Department and triage nurse.   

## 2017-08-29 NOTE — Progress Notes (Signed)
Pain med okay during this treatment, per Dr. Alvy Bimler. May repeat if nesseccary.

## 2017-09-02 ENCOUNTER — Telehealth: Payer: Self-pay | Admitting: Oncology

## 2017-09-02 NOTE — Telephone Encounter (Signed)
Macaela called and asked if the [facility] should be using her personal supplies.  She said the nurse last Wednesday used her saline flushes and gloves.  They have also used her q-tips in the past.  She also asked if they should be asking her about medication changes each time the come.  She is uncomfortable discussing her pain medication.  She also said the nurse used her bathroom last time and she heard cupboards closing but did not hear water running/flushing.  Advised her that I will check with [facility] and call her back.

## 2017-09-03 ENCOUNTER — Telehealth: Payer: Self-pay | Admitting: Oncology

## 2017-09-03 NOTE — Telephone Encounter (Signed)
Patient's son, Delfino Lovett, called about the home health nurses using his mother's supplies.  Advised him that [facility] has been contacted and they are working on the issues.  They should be calling his mom today to discuss everything.  He verbalized understanding and agreement.

## 2017-09-03 NOTE — Telephone Encounter (Signed)
Called Beza back and advised her that [facility] has been contacted about her concerns and should be calling her today.  Also discussed that the [facility] are supposed to ask about any medication changes.  Keeanna verbalized understanding and agreement.

## 2017-09-08 ENCOUNTER — Other Ambulatory Visit: Payer: Self-pay | Admitting: Family Medicine

## 2017-09-09 ENCOUNTER — Telehealth: Payer: Self-pay | Admitting: Oncology

## 2017-09-09 NOTE — Telephone Encounter (Signed)
[name] called and said she thinks her wound is changing.  She had her niece, Jackelyn Poling, with her who said she has a big sore about the size of 50 cent piece and the center wound looks larger as well.  Calisa has noticed a small amount of pink drainage.  She also has had more pain and is having to take morphine every 6 hours.  She said she has an appointment with Dr. Alvy Bimler tomorrow and just wanted to let her know.

## 2017-09-09 NOTE — Telephone Encounter (Signed)
Thanks for the info.

## 2017-09-10 ENCOUNTER — Telehealth: Payer: Self-pay | Admitting: *Deleted

## 2017-09-10 ENCOUNTER — Other Ambulatory Visit: Payer: Self-pay

## 2017-09-10 ENCOUNTER — Encounter: Payer: Self-pay | Admitting: Hematology and Oncology

## 2017-09-10 ENCOUNTER — Inpatient Hospital Stay (HOSPITAL_BASED_OUTPATIENT_CLINIC_OR_DEPARTMENT_OTHER): Payer: PPO | Admitting: Hematology and Oncology

## 2017-09-10 ENCOUNTER — Encounter: Payer: Self-pay | Admitting: Oncology

## 2017-09-10 ENCOUNTER — Inpatient Hospital Stay: Payer: PPO

## 2017-09-10 VITALS — BP 107/69 | HR 121 | Temp 97.5°F | Resp 18 | Ht 63.0 in

## 2017-09-10 DIAGNOSIS — Z5111 Encounter for antineoplastic chemotherapy: Secondary | ICD-10-CM | POA: Diagnosis not present

## 2017-09-10 DIAGNOSIS — C519 Malignant neoplasm of vulva, unspecified: Secondary | ICD-10-CM

## 2017-09-10 DIAGNOSIS — D61818 Other pancytopenia: Secondary | ICD-10-CM | POA: Diagnosis not present

## 2017-09-10 DIAGNOSIS — D509 Iron deficiency anemia, unspecified: Secondary | ICD-10-CM

## 2017-09-10 DIAGNOSIS — Z7189 Other specified counseling: Secondary | ICD-10-CM

## 2017-09-10 DIAGNOSIS — G893 Neoplasm related pain (acute) (chronic): Secondary | ICD-10-CM

## 2017-09-10 LAB — COMPREHENSIVE METABOLIC PANEL
ALBUMIN: 3.2 g/dL — AB (ref 3.5–5.0)
ALK PHOS: 77 U/L (ref 38–126)
ALT: 8 U/L (ref 0–44)
AST: 12 U/L — ABNORMAL LOW (ref 15–41)
Anion gap: 10 (ref 5–15)
BILIRUBIN TOTAL: 0.6 mg/dL (ref 0.3–1.2)
BUN: 23 mg/dL (ref 8–23)
CALCIUM: 9.7 mg/dL (ref 8.9–10.3)
CO2: 29 mmol/L (ref 22–32)
CREATININE: 0.92 mg/dL (ref 0.44–1.00)
Chloride: 96 mmol/L — ABNORMAL LOW (ref 98–111)
GFR calc non Af Amer: 58 mL/min — ABNORMAL LOW (ref >60)
GLUCOSE: 117 mg/dL — AB (ref 70–99)
Potassium: 4.5 mmol/L (ref 3.5–5.1)
Sodium: 135 mmol/L (ref 135–145)
TOTAL PROTEIN: 6.4 g/dL — AB (ref 6.5–8.1)

## 2017-09-10 LAB — SAMPLE TO BLOOD BANK

## 2017-09-10 LAB — MAGNESIUM: MAGNESIUM: 1.7 mg/dL (ref 1.7–2.4)

## 2017-09-10 LAB — CBC WITH DIFFERENTIAL/PLATELET
Basophils Absolute: 0 10*3/uL (ref 0.0–0.1)
Basophils Relative: 1 %
EOS ABS: 0 10*3/uL (ref 0.0–0.5)
Eosinophils Relative: 0 %
HEMATOCRIT: 25.1 % — AB (ref 34.8–46.6)
HEMOGLOBIN: 8.4 g/dL — AB (ref 11.6–15.9)
LYMPHS ABS: 0.2 10*3/uL — AB (ref 0.9–3.3)
LYMPHS PCT: 5 %
MCH: 30.4 pg (ref 25.1–34.0)
MCHC: 33.3 g/dL (ref 31.5–36.0)
MCV: 91.4 fL (ref 79.5–101.0)
MONOS PCT: 12 %
Monocytes Absolute: 0.4 10*3/uL (ref 0.1–0.9)
NEUTROS ABS: 3.1 10*3/uL (ref 1.5–6.5)
NEUTROS PCT: 82 %
Platelets: 63 10*3/uL — ABNORMAL LOW (ref 145–400)
RBC: 2.75 MIL/uL — AB (ref 3.70–5.45)
RDW: 20.2 % — ABNORMAL HIGH (ref 11.2–14.5)
WBC: 3.7 10*3/uL — AB (ref 3.9–10.3)

## 2017-09-10 LAB — IRON AND TIBC
Iron: 37 ug/dL — ABNORMAL LOW (ref 41–142)
Saturation Ratios: 18 % — ABNORMAL LOW (ref 21–57)
TIBC: 206 ug/dL — ABNORMAL LOW (ref 236–444)
UIBC: 169 ug/dL

## 2017-09-10 LAB — FERRITIN: Ferritin: 1184 ng/mL — ABNORMAL HIGH (ref 11–307)

## 2017-09-10 MED ORDER — SODIUM CHLORIDE 0.9% FLUSH
10.0000 mL | Freq: Once | INTRAVENOUS | Status: AC
  Administered 2017-09-10: 10 mL
  Filled 2017-09-10: qty 10

## 2017-09-10 MED ORDER — MORPHINE SULFATE 30 MG PO TABS: 30.0000 mg | ORAL_TABLET | Freq: Four times a day (QID) | ORAL | 0 refills | Status: AC | PRN

## 2017-09-10 MED ORDER — MORPHINE SULFATE ER 30 MG PO TBCR: 30.0000 mg | EXTENDED_RELEASE_TABLET | Freq: Three times a day (TID) | ORAL | 0 refills | Status: AC

## 2017-09-10 NOTE — Progress Notes (Signed)
[facility] OFFICE PROGRESS NOTE  Patient Care Team: Mikey Kirschner, MD as PCP - General (Family Medicine)  ASSESSMENT & PLAN:  Vulva cancer [facility]) Unfortunately, she has disease progression clinically We had multiple discussions about goals of care in the past She is getting weaker She is in agreement to discontinue palliative chemotherapy and get enrolled in hospice I will get her PICC line removed today  Pancytopenia, acquired ([place]) She has pancytopenia from recent treatment She is mildly symptomatic We discussed the risk and benefits of transfusion support but the patient declined  Cancer associated pain She has worsening pain control However, she is getting weaker and appears somewhat sedated I recommend we continue current dose of MS Contin 30 mg 3 times a day along with immediate release morphine 30 mg every 6 hours as needed I refilled her prescription today We will continue to manage her chronic cancer pain through palliative care/hospice service in the future  Goals of care, counseling/discussion We had extensive discussion about goals of care The patient is in agreement to get enroll in hospice We agree with do not resuscitation and transfusion support We discussed prognosis.  I estimated longevity to be less than 3 months   No orders of the defined types were placed in this encounter.   INTERVAL HISTORY: Please see below for problem oriented charting. She returns with her son for further follow-up Over the past few days, she complained of worsening pain She showed me pictures of her vulva wound which is getting worse She was also getting weaker and had a minor fall this morning due to loss of balance and weakness She denies major injuries She denies fever or chills She has shortness of breath on minimal exertion Her son noticed that the patient appears somewhat sedated with her pain medicine and has appeared to be forgetful at times She denies  nausea or constipation  SUMMARY OF ONCOLOGIC HISTORY: Oncology History   PD-L1 neg     Vulva cancer ([place])   02/22/2016 Pathology Results    Labium, biopsy, right mid - INVASIVE SQUAMOUS CELL CARCINOMA.    02/23/2016 Imaging    Ct abdomen and pelvis Mild asymmetric soft tissue thickening in the right vulvar/perineal region, likely corresponding to the patient's known vulvar cancer, poorly evaluated on CT.  No evidence of metastatic disease.  Status post hysterectomy.    02/27/2016 Pathology Results    1. Lymph nodes, regional resection, right inguinal - METASTATIC CARCINOMA IN 1 OF 6 LYMPH NODES (1/6). 2. Vulva, excision, right - INVASIVE KERATINIZING SQUAMOUS CELL CARCINOMA, MODERATELY DIFFERENTIATED, SPANNING 2.1 CM WITH A DEPTH OF INVASION OF 0.6 CM. - THE SURGICAL RESECTION MARGIN ARE NEGATIVE FOR CARCINOMA. - DIFFUSE LICHEN SCLEROSUS ET ATROPHICUS. - SEE ONCOLOGY TABLE BELOW. Microscopic Comment 2. VULVA Specimen: Right vulvectomy. Procedure: Resection Lymph node sampling performed: Yes. Tumor site: Right vulva Tumor focality: Carcinoma is unifocal. Maximum tumor size (cm): 2.1 cm (gross measurement). Histologic type: Keratinizing squmaous cell carcinoma. Grade: Moderately differentiated. Depth of stromal invasion (mm): 6 mm (glass slide measurement) Margins: Negative for carcinoma. Distance of carcinoma from nearest margin: 0.6 cm to the 3 o'clock margin. Lymph - Vascular invasion: Not identified. Lymph nodes: number examined: 6 Number positive: 1 size of metastatic focus: 0.3 cm (glass slide measurement) extracapsular extension: Not identified. TNM code: pT1b, pN1a FIGO Stage (based on pathologic findings, needs clinical correlation): IIIA Comment: Grossly, there is a 2.1 cm sessile papular lesion which on histologic evaluation reveals invasive keratinizing squamous  cell carcinoma invading to a depth of 0.6 cm as measured from a glass slide. In addition, there is  diffuse lichen sclerosus et atrophicus. The surgical resection margins are negative for invasive caricnoma. Additional studies can be performed upon clinical request.    02/27/2016 Surgery    Surgery: Right radical partial vulvectomy, right inguinal lymph node dissection  Surgeons:  Imagene Gurney A. Alycia Rossetti, MD; Lahoma Crocker, MD   Pathology: Right vulva with resection margin with suture at 12:00. Right inguinal nodes.   Operative findings: 1.5 cm fungating erosive lesion of the right vulva approximately 2 cm from the urethra. Evidence of lichen sclerosis throughout the entire vulvar. No other ulcerative lesion suspicious for vulvar carcinoma. Shotty palpable lymphadenopathy in the right inguinal space.      04/01/2016 Imaging    Ct chest 1. Multiple pulmonary nodules as detailed above, generally in the 4 mm and less average diameter range although with 1 right lower lobe subpleural nodule measuring about 8 mm in diameter. These likely warrant surveillance. 2. Hypodense right thyroid nodule, 1.1 cm in long axis. Consider further evaluation with thyroid ultrasound. If patient is clinically hyperthyroid, consider nuclear medicine thyroid uptake and scan. 3. Stable hypodense lateral splenic lesion, likely a cyst. 4. Aortic and coronary atherosclerosis.     04/02/2016 Pathology Results    Labium, biopsy, left minora INVASIVE SQUAMOUS CELL CARCINOMA    04/09/2016 Pathology Results    1. Vulva, vulvectomy, left - INVASIVE SQUAMOUS CELL CARCINOMA, NON-KERATINIZING, SPANNING 2.4 CM IN WIDTH X 0.4 CM IN DEPTH. - CARCINOMA IS BROADLY PRESENT AT THE 6 O'CLOCK MARGIN OF SPECIMEN #1. - SEE ONCOLOGY TABLE BELOW. 2. Vulva, excision, lateral margin - SQUAMOUS LINED EPITHELIUM WITH LICHEN SCLEROSUS ET ATROPHICUS. 3. Vulva, excision, medial margin - SQUAMOUS LINED EPITHELIUM WITH LICHEN SCLEROSUS ET ATROPHICUS. Microscopic Comment 1. VULVA Specimen: Left vulva. Procedure: Left vulvectomy with  additional lateral and medial margins. Lymph node sampling performed: Not on current case. Tumor site: Left vulva. Tumor focality: Unifocal. Maximum tumor size (cm): 2.4 cm in width (gross measurement) Histologic type: Squamous cell carcinoma, non-keratinizing. Grade: High grade. Depth of stromal invasion (mm): 0.4 cm (4 mm), glass slide measurement. Margins: Invasive carcinoma broadly present at the 6 o'clock margin of specimen #1. Lymph - Vascular invasion: Not identified. Lymph nodes: None examined. TNM code: pT1b, pNX FIGO Stage (based on pathologic findings, needs clinical correlation): IB    04/09/2016 Surgery    Surgery: Partial radical left vulvectomy  Surgeons:  Donaciano Eva, MD  Pathology: 1/ left vulva with marking stitch at 12 o'clock anterior, 2/ lateral (thigh) margin with stitch at true new lateral margin. 3/ medial (vaginal) margin with marking stitch at true new medial margin. Operative findings: 2cm left mid labial nodular lesion      05/03/2016 Imaging    Ct chest 1.  No evidence of acute pulmonary embolus. 2. Calcified coronary artery atherosclerosis. Negative visible aorta. 3. Pulmonary atelectasis with no other acute pulmonary process. The small subpleural pulmonary nodules seen by CT last month are stable.     06/05/2016 - 07/25/2016 Radiation Therapy    Radiation treatment dates:   06/05/16 - 07/25/16  Site/dose:   Pelvis treated to 45 Gy in 25 fractions, then vulvar region Boosted an additional 18 Gy in 9 fractions.  Beams/energy:   Pelvis:  IMRT  //  6X  Boost:  IMRT  //  6X     09/30/2016 Pathology Results    Vulva, biopsy, anterior vulva INVASIVE SQUAMOUS CELL CARCINOMA    10/07/2016 Imaging    Ct chest 1. Stable bilateral pulmonary nodules, likely benign but will require surveillance. Recommend followup noncontrast chest CT in 6-12 months. 2. No mediastinal or hilar mass or adenopathy. 3. Stable coronary  artery calcifications.  Aortic Atherosclerosis (ICD10-I70.0).    10/17/2016 Pathology Results    Vulva, vulvectomy, anterior - INVASIVE SQUAMOUS CELL CARCINOMA, SPANNING 2.7 CM AND EXTENDING TO A DEPTH OF 0.3 CM. - RESECTION MARGINS ARE NEGATIVE. - SEE ONCOLOGY TABLE. Microscopic Comment VULVA Specimen: Anterior vulva. Procedure: Partial anterior vulvectomy. Lymph node sampling performed: None. Tumor site: Anterior vulva. Tumor focality: Unifocal. Maximum tumor size (cm): 2.7 cm. Histologic type: Squamous cell carcinoma, non-keratinizing. Grade: G2, moderately differentiated. Depth of stromal invasion (mm): 3 mm. Margins: Negative Distance of carcinoma from nearest margin: Invasive tumor is 1 mm from urethral margin. Lymph - Vascular invasion: Not identified. Lymph nodes: None. TNM code: pT1b, pNX FIGO Stage (based on pathologic findings, needs clinical correlation): IB    10/17/2016 Surgery    Surgery: Partial radical anterior vulvectomy  Surgeons:  Donaciano Eva, MD  Pathology: anterior vulva with marking stitch at anterior clitoral margin o'clock  Operative findings: 3cm raised vulvar cancer (leukoplakia) at midline and right vulva extending to within 0.5cm of anterior urethral meatus      02/17/2017 Pathology Results    Vulva, biopsy - PARAKERATOSIS AND ACUTE INFLAMMATION. - PAS IS NEGATIVE FOR FUNGAL ORGANISMS. - NO DYSPLASIA OR MALIGNANCY.    05/01/2017 Imaging    Ct abdomen and pelvis New 4 x 3 cm rim enhancing lesion central air bubbles in the right vulvar region, which could represent locally recurrent tumor with necrosis or abscess.  No evidence of metastatic disease within the abdomen or pelvis.    05/15/2017 Pathology Results    1. Labium, biopsy, right posterior labia majora - PARAKERATOSIS. - NO DYSPLASIA OR MALIGNANCY. 2. Vulva, excision, anterior right mons pubis lesion - INVASIVE MODERATE TO POORLY DIFFERENTIATED SQUAMOUS CELL  CARCINOMA. - ASSOCIATED ULCER, NECROSIS AND ACUTE INFLAMMATION. - SEE COMMENT. 3. Labium, biopsy, left mid labia majora - POORLY DIFFERENTIATED SQUAMOUS CELL CARCINOMA. - SEE COMMENT. Microscopic Comment 2. The specimen is disrupted hampering assessment of margin orientation and size, however, carcinoma involves almost the entire specimen (thus size estimated to be at least 3.1 cm with depth of 1 cm) and extends to the majority of the presumed lateral and deep margins. There is perineural invasion. 3. The carcinoma is largely beneath the epidermis and extends to the deep and lateral edges of the biopsy.    05/15/2017 Surgery    Surgery: debridement of anterior vulvar abscess, vulvar biopsies.   Surgeons:  Donaciano Eva, MD  Pathology: right posterior labia majora, anterior mons pubis, left mid labia majora  Operative findings: 6cm cavitating anterior mons pubis indurated lesion. It is approximately 3.5cm from the urethral meatus to inferior aspect of mass. Two indurated 1-2cm nodular areas (one on the posterior right labia majora and one on the mid left labia majora). No periurethral lesions.      06/02/2017 PET scan    Stable hypermetabolic soft tissue density in right vulva, which may be due to postoperative change from recent excision, although residual malignancy and infection cannot be excluded.  No evidence of local or distant metastatic disease.    06/06/2017 Procedure    Successful  right arm power PICC line placement with ultrasound and fluoroscopic guidance. The catheter is ready for use.    06/12/2017 Cancer Staging    Staging form: Vulva, AJCC 8th Edition - Clinical: Stage II (cT2, cN0, cM0) - Signed by Heath Lark, MD on 06/12/2017    06/13/2017 - 07/25/2017 Chemotherapy    The patient had carboplatin and Taxol    08/02/2017 - 08/10/2017 Hospital Admission    She was hospitalized due to severe vulvar pain and infection.    08/03/2017 Imaging    Overall there has  been progression of disease involving bilateral vulva. On the right this demonstrates progressive anterior involvement, ulceration and extension into the mons pubis. This also demonstrates progressive posterior extension into the medial upper thigh.     Genetic Testing    Patient has genetic testing done for PD-L1 clone [id number]. Results revealed: PD-L1 staining in tumor cells (TC): 0% PD-L1 staining in tumor-associated immune cells (IC): 5% PD-L1 combined positive score (CPS): <1%    08/22/2017 -  Chemotherapy    The patient had weekly cisplatin     REVIEW OF SYSTEMS:   Constitutional: Denies fevers, chills or abnormal weight loss Eyes: Denies blurriness of vision Ears, nose, mouth, throat, and face: Denies mucositis or sore throat Cardiovascular: Denies palpitation, chest discomfort or lower extremity swelling Gastrointestinal:  Denies nausea, heartburn or change in bowel habits Skin: Denies abnormal skin rashes Lymphatics: Denies new lymphadenopathy or easy bruising Behavioral/Psych: Mood is stable, no new changes  All other systems were reviewed with the patient and are negative.  I have reviewed the past medical history, past surgical history, social history and family history with the patient and they are unchanged from previous note.  ALLERGIES:  is allergic to oxycodone; celebrex [celecoxib]; doxycycline; iron; lodine [etodolac]; nabumetone; tetracycline; and xarelto [rivaroxaban].  MEDICATIONS:  Current Outpatient Medications  Medication Sig Dispense Refill  . Amino Acids-Protein Hydrolys (FEEDING SUPPLEMENT, PRO-STAT SUGAR FREE 64,) LIQD Take 30 mLs by mouth 2 (two) times daily. 900 mL 0  . blood glucose meter kit and supplies KIT Dispense based on patient and insurance preference. Use up to three times daily as directed (FOR ICD-10 E11.9). 1 each 0  . collagenase (SANTYL) ointment Apply 1 application topically daily. Apply to vulva wound bed once daily with dressing changes  (Patient not taking: Reported on 08/03/2017) 15 g 1  . docusate sodium (COLACE) 100 MG capsule Take 1 capsule (100 mg total) by mouth 2 (two) times daily. 10 capsule 0  . gabapentin (NEURONTIN) 100 MG capsule TAKE 1 CAPSULE BY MOUTH THREE TIMES A DAY. 270 capsule 1  . magnesium oxide (MAG-OX) 400 (241.3 Mg) MG tablet Take 1 tablet (400 mg total) by mouth 2 (two) times daily. 60 tablet 3  . metFORMIN (GLUCOPHAGE) 500 MG tablet TAKE (1) TABLET BY MOUTH TWICE DAILY. 180 tablet 0  . morphine (MS CONTIN) 30 MG 12 hr tablet Take 1 tablet (30 mg total) by mouth every 8 (eight) hours. 90 tablet 0  . morphine (MSIR) 30 MG tablet Take 1 tablet (30 mg total) by mouth every 6 (six) hours as needed for severe pain. 90 tablet 0  . ondansetron (ZOFRAN) 8 MG tablet Take 1 tablet (8 mg total) by mouth every 8 (eight) hours as needed for nausea. 60 tablet 3  . ONE TOUCH ULTRA TEST test strip USE TO TEST BLOOD SUGAR UP TO 3 TIMES DAILY AS DIRECTED. 100 each 5  . polyethylene glycol (MIRALAX / GLYCOLAX) packet  Take 17 g by mouth daily. 14 each 0  . prochlorperazine (COMPAZINE) 10 MG tablet Take 1 tablet (10 mg total) by mouth every 6 (six) hours as needed for nausea or vomiting. 60 tablet 9  . sodium chloride flush 0.9 % SOLN injection Inject 10 mLs into the vein daily. 30 Syringe 9   No current facility-administered medications for this visit.     PHYSICAL EXAMINATION: ECOG PERFORMANCE STATUS: 3 - Symptomatic, >50% confined to bed  Vitals:   09/10/17 0935 09/10/17 1133  BP: 101/68 107/69  Pulse: (!) 121   Resp: 18   Temp: (!) 97.5 F (36.4 C)   SpO2: 96%    Filed Weights    GENERAL:alert, no distress and comfortable.  She looks pale and weak SKIN: Noted significant worsening skin lesion in her vulval area EYES: normal, Conjunctiva are pale and non-injected, sclera clear NEURO: alert & oriented x 3 with fluent speech, no focal motor/sensory deficits  LABORATORY DATA:  I have reviewed the data as  listed    Component Value Date/Time   NA 135 09/10/2017 0900   NA 144 09/30/2016 1505   K 4.5 09/10/2017 0900   K 4.4 09/30/2016 1505   CL 96 (L) 09/10/2017 0900   CO2 29 09/10/2017 0900   CO2 27 09/30/2016 1505   GLUCOSE 117 (H) 09/10/2017 0900   GLUCOSE 101 09/30/2016 1505   BUN 23 09/10/2017 0900   BUN 16.7 09/30/2016 1505   CREATININE 0.92 09/10/2017 0900   CREATININE 0.88 07/04/2017 0821   CREATININE 0.8 09/30/2016 1505   CALCIUM 9.7 09/10/2017 0900   CALCIUM 9.8 09/30/2016 1505   PROT 6.4 (L) 09/10/2017 0900   PROT 7.3 09/30/2016 1505   ALBUMIN 3.2 (L) 09/10/2017 0900   ALBUMIN 3.6 09/30/2016 1505   AST 12 (L) 09/10/2017 0900   AST 12 07/04/2017 0821   AST 27 09/30/2016 1505   ALT 8 09/10/2017 0900   ALT 9 07/04/2017 0821   ALT 26 09/30/2016 1505   ALKPHOS 77 09/10/2017 0900   ALKPHOS 76 09/30/2016 1505   BILITOT 0.6 09/10/2017 0900   BILITOT 0.3 07/04/2017 0821   BILITOT 0.49 09/30/2016 1505   GFRNONAA 58 (L) 09/10/2017 0900   GFRNONAA >60 07/04/2017 0821   GFRAA >60 09/10/2017 0900   GFRAA >60 07/04/2017 0821    No results found for: SPEP, UPEP  Lab Results  Component Value Date   WBC 3.7 (L) 09/10/2017   NEUTROABS 3.1 09/10/2017   HGB 8.4 (L) 09/10/2017   HCT 25.1 (L) 09/10/2017   MCV 91.4 09/10/2017   PLT 63 (L) 09/10/2017      Chemistry      Component Value Date/Time   NA 135 09/10/2017 0900   NA 144 09/30/2016 1505   K 4.5 09/10/2017 0900   K 4.4 09/30/2016 1505   CL 96 (L) 09/10/2017 0900   CO2 29 09/10/2017 0900   CO2 27 09/30/2016 1505   BUN 23 09/10/2017 0900   BUN 16.7 09/30/2016 1505   CREATININE 0.92 09/10/2017 0900   CREATININE 0.88 07/04/2017 0821   CREATININE 0.8 09/30/2016 1505      Component Value Date/Time   CALCIUM 9.7 09/10/2017 0900   CALCIUM 9.8 09/30/2016 1505   ALKPHOS 77 09/10/2017 0900   ALKPHOS 76 09/30/2016 1505   AST 12 (L) 09/10/2017 0900   AST 12 07/04/2017 0821   AST 27 09/30/2016 1505   ALT 8  09/10/2017 0900   ALT 9 07/04/2017 1093  ALT 26 09/30/2016 1505   BILITOT 0.6 09/10/2017 0900   BILITOT 0.3 07/04/2017 0821   BILITOT 0.49 09/30/2016 1505       All questions were answered. The patient knows to call the clinic with any problems, questions or concerns. No barriers to learning was detected.  I spent 40 minutes counseling the patient face to face. The total time spent in the appointment was 55 minutes and more than 50% was on counseling and review of test results  Heath Lark, MD 09/10/2017 1:28 PM

## 2017-09-10 NOTE — Assessment & Plan Note (Signed)
She has pancytopenia from recent treatment She is mildly symptomatic We discussed the risk and benefits of transfusion support but the patient declined

## 2017-09-10 NOTE — Progress Notes (Signed)
Picc line removed per order.  Catheter tip intact and measured at 42 cm.  Pt tolerated well.

## 2017-09-10 NOTE — Assessment & Plan Note (Signed)
She has worsening pain control However, she is getting weaker and appears somewhat sedated I recommend we continue current dose of MS Contin 30 mg 3 times a day along with immediate release morphine 30 mg every 6 hours as needed I refilled her prescription today We will continue to manage her chronic cancer pain through palliative care/hospice service in the future

## 2017-09-10 NOTE — Assessment & Plan Note (Addendum)
We had extensive discussion about goals of care The patient is in agreement to get enroll in hospice We agree with do not resuscitation and transfusion support We discussed prognosis.  I estimated longevity to be less than 3 months

## 2017-09-10 NOTE — Assessment & Plan Note (Addendum)
Unfortunately, she has disease progression clinically We had multiple discussions about goals of care in the past She is getting weaker She is in agreement to discontinue palliative chemotherapy and get enrolled in hospice I will get her PICC line removed today

## 2017-09-10 NOTE — Telephone Encounter (Signed)
Referral called to [facility]. Office notes and demographic sheet faxed to their office

## 2017-09-12 ENCOUNTER — Inpatient Hospital Stay: Payer: PPO

## 2017-09-17 ENCOUNTER — Other Ambulatory Visit: Payer: PPO

## 2017-09-17 ENCOUNTER — Ambulatory Visit: Payer: PPO | Admitting: Hematology and Oncology

## 2017-09-19 ENCOUNTER — Ambulatory Visit: Payer: PPO

## 2017-09-24 ENCOUNTER — Other Ambulatory Visit: Payer: PPO

## 2017-09-24 ENCOUNTER — Ambulatory Visit: Payer: PPO | Admitting: Hematology and Oncology

## 2017-09-26 ENCOUNTER — Ambulatory Visit: Payer: PPO

## 2017-10-23 ENCOUNTER — Ambulatory Visit: Payer: PPO | Admitting: Family Medicine

## 2017-11-11 ENCOUNTER — Telehealth: Payer: Self-pay

## 2017-11-11 ENCOUNTER — Telehealth: Payer: Self-pay | Admitting: *Deleted

## 2017-11-21 NOTE — Telephone Encounter (Signed)
But[facility] called and left a message asking if Dr. Alvy Bimler would sign the death certificate.  Called back. Per Dr. Alvy Bimler she will sign the death certificate.

## 2017-11-21 NOTE — Telephone Encounter (Signed)
"  Cher Nakai RN [phone]) with [facility] calling to report [name] passed away this morning in her home."

## 2017-11-21 DEATH — deceased

## 2019-03-20 IMAGING — XA IR PICC >5YO
1 series · 1 of 1 positions shown · non-contrast
Comparison: none

INDICATION: Patient with history of vulvar carcinoma, iron deficiency anemia;
central venous access requested for chemotherapy and iron infusions

[Series 300: ir fluoro guide cv line left · 1 of 1 slices shown]
[im 1/1]
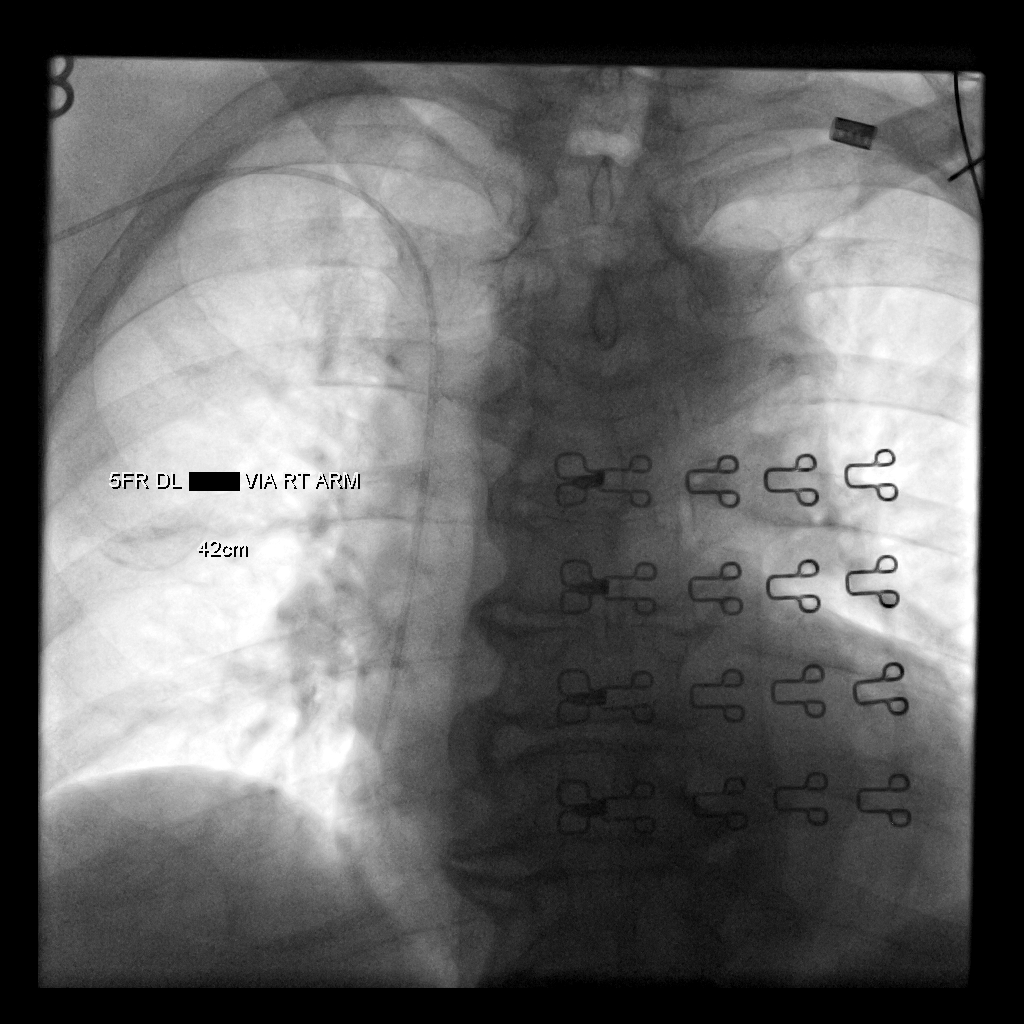

[1 of 1 positions shown; findings below may reference images not displayed]

EXAM:
RIGHT UPPER EXTREMITY PICC LINE PLACEMENT WITH ULTRASOUND AND
FLUOROSCOPIC GUIDANCE

MEDICATIONS:
1% lidocaine to skin and subcutaneous tissues

ANESTHESIA/SEDATION:
None

FLUOROSCOPY TIME:  Fluoroscopy Time: 1 minute  (9 mGy).

COMPLICATIONS:
None immediate.

PROCEDURE:
The patient was advised of the possible risks and complications and
agreed to undergo the procedure. The patient was then brought to the
angiographic suite for the procedure.

The right arm was prepped with chlorhexidine, draped in the usual
sterile fashion using maximum barrier technique (cap and mask,
sterile gown, sterile gloves, large sterile sheet, hand hygiene and
cutaneous antisepsis) and infiltrated locally with 1% Lidocaine.

Ultrasound demonstrated patency of the right basilic vein, and this
was documented with an image. Under real-time ultrasound guidance,
this vein was accessed with a 21 gauge micropuncture needle and
image documentation was performed. A [DATE] wire was introduced in to
the vein. Over this, a 5 French double lumen power injectable PICC
was advanced to the lower SVC/right atrial junction. Fluoroscopy
during the procedure and fluoro spot radiograph confirms appropriate
catheter position. The catheter was flushed and covered with a
sterile dressing.

Catheter length: 42 cm
IMPRESSION: Successful right arm power PICC line placement with ultrasound and
fluoroscopic guidance. The catheter is ready for use.

## 2019-05-17 IMAGING — CT CT PELVIS W/ CM
2 of 3 series · 16 of 46 positions shown, 18 images · IV contrast (ISOVUE)
Comparison: CT of the abdomen pelvis dated 04/30/2017

ADDENDUM:
The following is a comparison to PET-CT from 06/02/2017.

The ulcerative mass involving the bulb of and extending into the
vulva measures 5.7 by 3.4 by 4.0 cm. On the previous exam this
measured 3.7 x 2.9 by 3.0 cm. Additionally, there is progressive
enhancing tumor which extends posteriorly into the right upper thigh
with a maximum thickness of 0.9 cm, image 116/4. 0.7 cm.
There is a enlarging enhancing nodule within the subcutaneous soft
tissues of the left vulva measuring 1.2 x 1.0 cm. Previously this
measured 0.7 cm in maximum diameter. More anteriorly within the left
vulva is a enhancing mass measuring 1.2 x 2.6 cm, image 114/4.
Previously this measured 1.6 x 1.2 cm.
CLINICAL DATA: 77-year-old female with malignant neoplasm of the
vulva.
EXAM:
CT PELVIS WITH CONTRAST
TECHNIQUE: Multidetector CT imaging of the pelvis was performed using the
standard protocol following the bolus administration of intravenous
contrast.
CONTRAST:  75mL OMNIPAQUE IOHEXOL 300 MG/ML  SOLN

[Series 4: axial st · axial · 0.74mm/px · z∈[+986,+1220]mm · 13 of 135 slices shown, 15 images]
[im 9/135  soft-tissue]
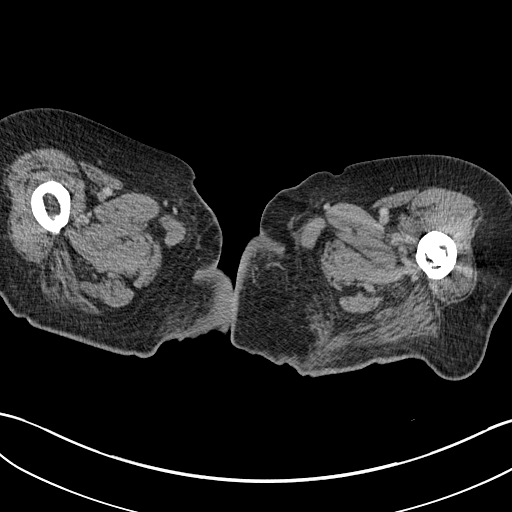
[im 9/135  bone]
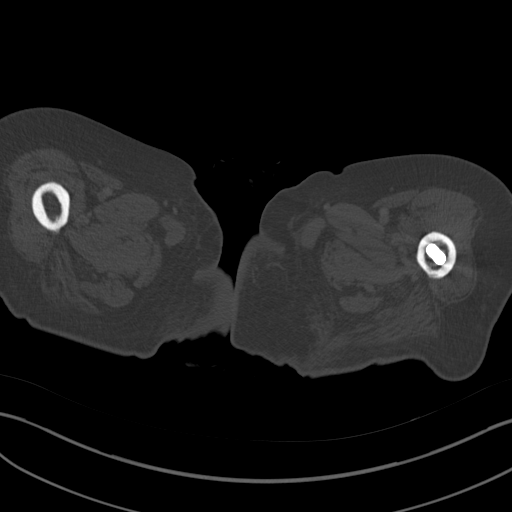
[im 18/135  soft-tissue]
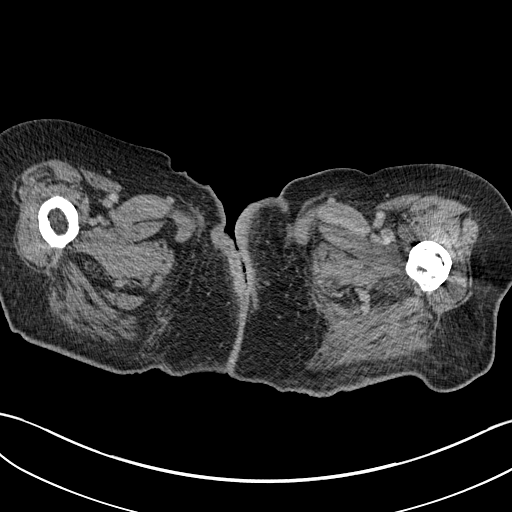
[im 26/135  soft-tissue]
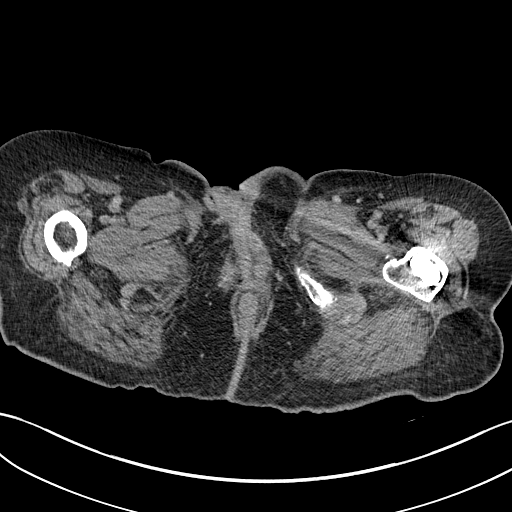
[im 39/135  soft-tissue]
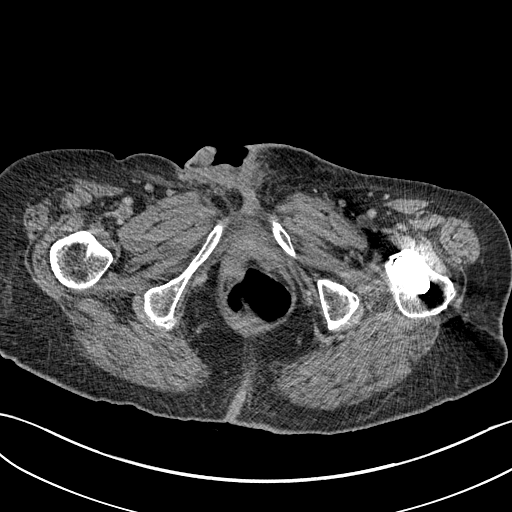
[im 48/135  soft-tissue]
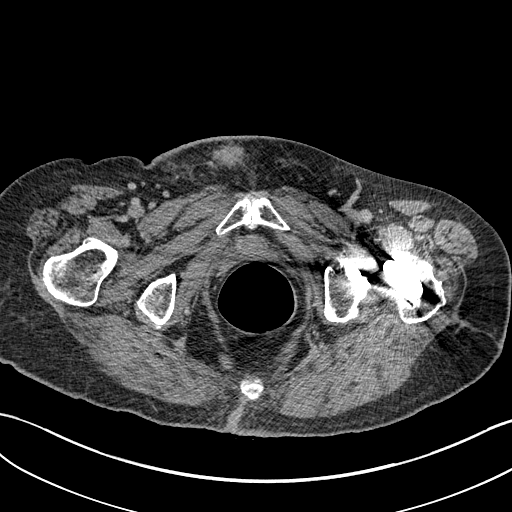
[im 57/135  soft-tissue]
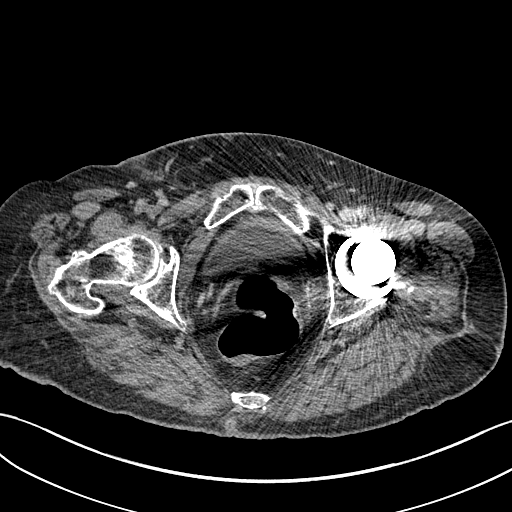
[im 70/135  soft-tissue]
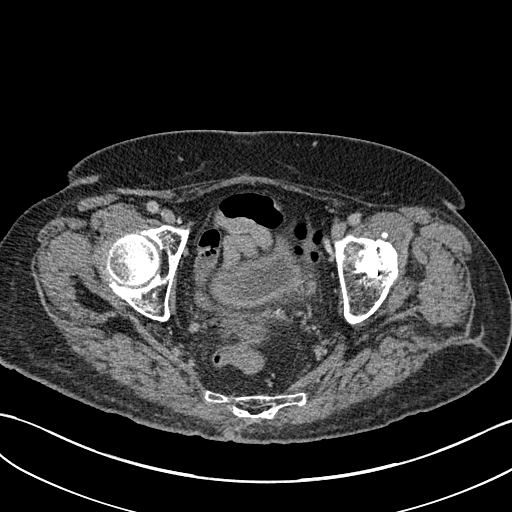
[im 78/135  soft-tissue]
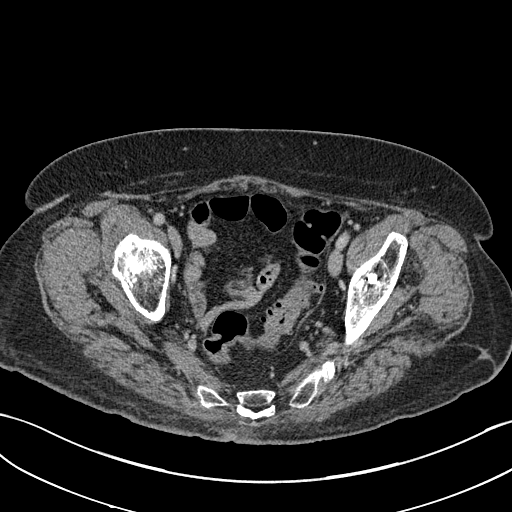
[im 87/135  soft-tissue]
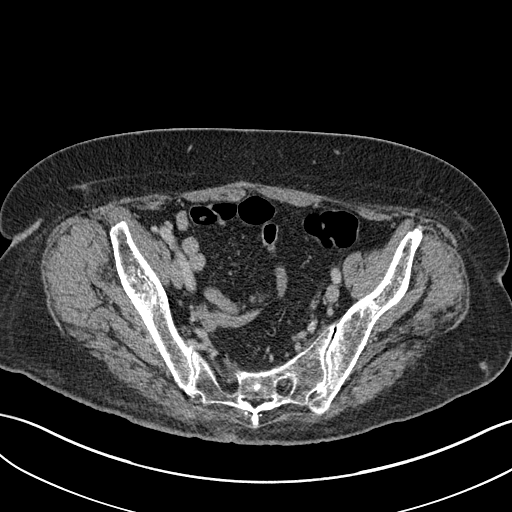
[im 87/135  bone]
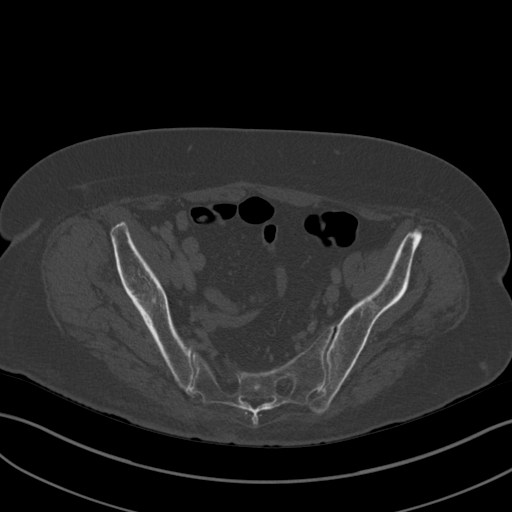
[im 96/135  soft-tissue]
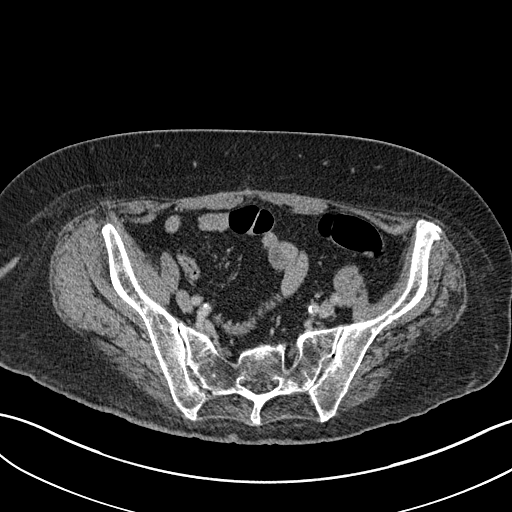
[im 109/135  soft-tissue]
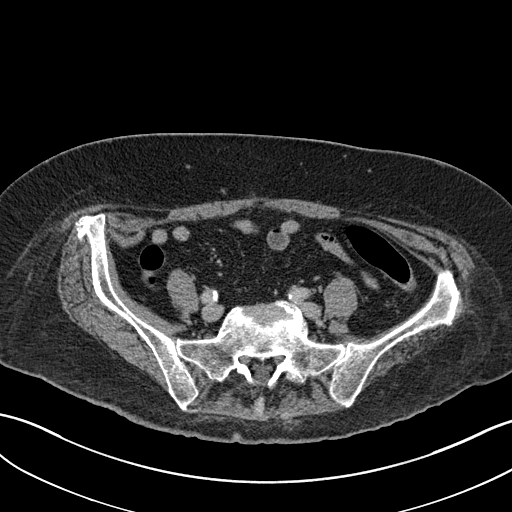
[im 117/135  soft-tissue]
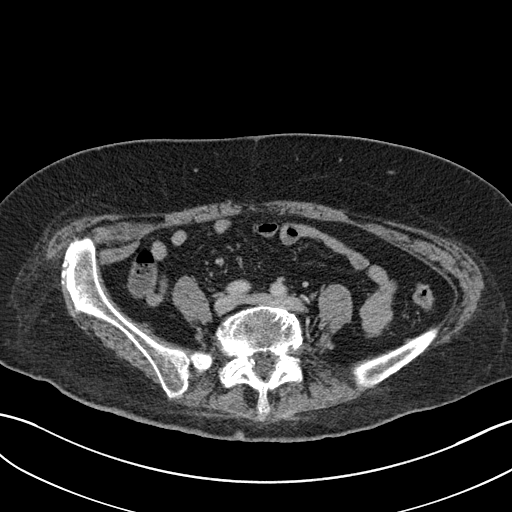
[im 126/135  soft-tissue]
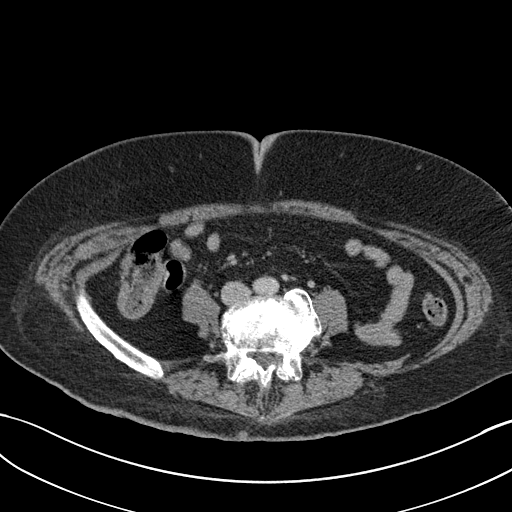

[Series 602: coronal · coronal · 0.74mm/px · 3 of 135 slices shown]
[im 45/135  soft-tissue]
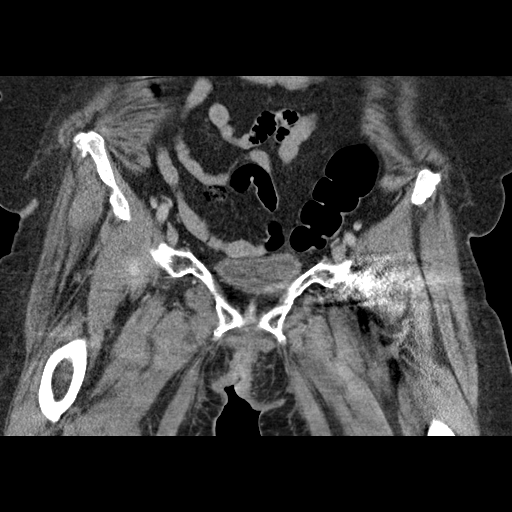
[im 60/135  soft-tissue]
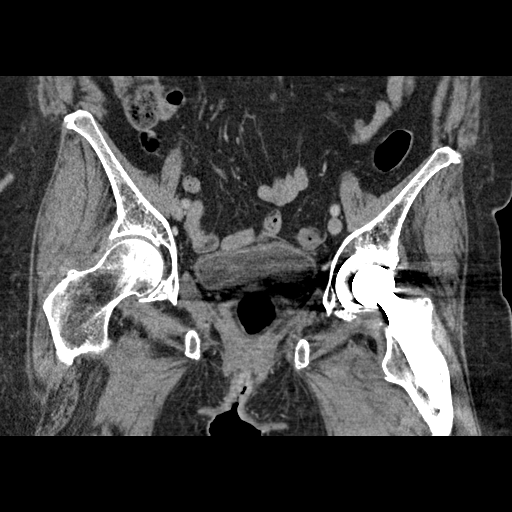
[im 75/135  soft-tissue]
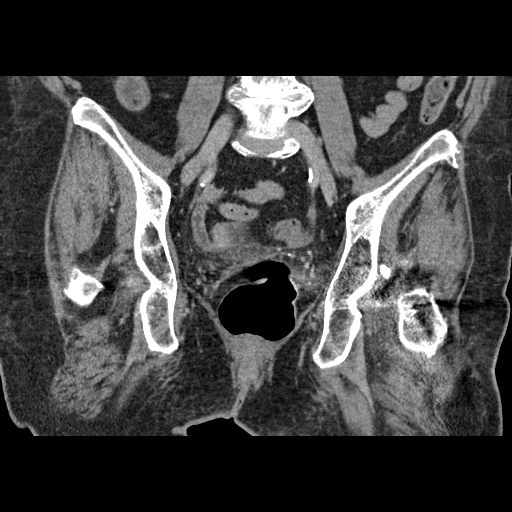

[16 of 46 positions shown; findings below may reference images not displayed]

IMPRESSION: Overall there has been progression of disease involving bilateral
vulva. On the right this demonstrates progressive anterior
involvement, ulceration and extension into the mons pubis. This also
demonstrates progressive posterior extension into the medial upper
thigh.
FINDINGS: Urinary Tract: The visualized distal ureters and urinary bladder
appear unremarkable.

Bowel: There is sigmoid diverticulosis without active inflammatory
changes. No dilated bowel in the pelvis.

Vascular/Lymphatic: Mild atherosclerotic disease.  No adenopathy.

Reproductive: Hysterectomy. There is a 3.6 x 2.9 cm nodular density
in the skin and subcutaneous tissues of the right vulvar region.
There is irregularity of the overlying skin which may represent
ulceration. Multiple additional nodules noted in the volar region
and extending into the perineal subcutaneous soft tissues and
adjacent to the vaginal orifice and anterior to the rectum. For
example there is a 2.6 x 1.2 cm nodular lesion over the left vulvar
area and a 12 x 10 mm nodular lesion in the left perineum (series 4
image 112). There has been interval increase in the size of the
vulvar lesion and development of new vulvar and perineal lesions
compared to the prior CT.

Other:  None

Musculoskeletal: Total left hip arthroplasty. Osteopenia and
degenerative changes of the spine. No acute osseous pathology.
IMPRESSION: 1. Interval increase in the size and extent of vulvar mass with
development of new vulvar and perineal lesions since the prior CT.
2. No evidence of metastatic disease within the pelvis.
